# Patient Record
Sex: Female | Born: 1990
Health system: Southern US, Community
[De-identification: ages and names within clinical notes are randomized; demographics above are authoritative.]

## PROBLEM LIST (undated history)

## (undated) DIAGNOSIS — M419 Scoliosis, unspecified: Secondary | ICD-10-CM

## (undated) DIAGNOSIS — R079 Chest pain, unspecified: Secondary | ICD-10-CM

## (undated) DIAGNOSIS — F419 Anxiety disorder, unspecified: Secondary | ICD-10-CM

## (undated) DIAGNOSIS — F32A Depression, unspecified: Secondary | ICD-10-CM

## (undated) DIAGNOSIS — R109 Unspecified abdominal pain: Secondary | ICD-10-CM

## (undated) DIAGNOSIS — G709 Myoneural disorder, unspecified: Secondary | ICD-10-CM

## (undated) DIAGNOSIS — D519 Vitamin B12 deficiency anemia, unspecified: Secondary | ICD-10-CM

## (undated) DIAGNOSIS — M1990 Unspecified osteoarthritis, unspecified site: Secondary | ICD-10-CM

## (undated) DIAGNOSIS — T7840XA Allergy, unspecified, initial encounter: Secondary | ICD-10-CM

## (undated) DIAGNOSIS — J45909 Unspecified asthma, uncomplicated: Secondary | ICD-10-CM

## (undated) DIAGNOSIS — I10 Essential (primary) hypertension: Secondary | ICD-10-CM

## (undated) DIAGNOSIS — F319 Bipolar disorder, unspecified: Secondary | ICD-10-CM

## (undated) DIAGNOSIS — F4310 Post-traumatic stress disorder, unspecified: Secondary | ICD-10-CM

## (undated) DIAGNOSIS — F329 Major depressive disorder, single episode, unspecified: Secondary | ICD-10-CM

## (undated) DIAGNOSIS — F4000 Agoraphobia, unspecified: Secondary | ICD-10-CM

## (undated) DIAGNOSIS — I219 Acute myocardial infarction, unspecified: Secondary | ICD-10-CM

## (undated) DIAGNOSIS — G249 Dystonia, unspecified: Secondary | ICD-10-CM

## (undated) DIAGNOSIS — I639 Cerebral infarction, unspecified: Secondary | ICD-10-CM

## (undated) DIAGNOSIS — R0902 Hypoxemia: Secondary | ICD-10-CM

## (undated) DIAGNOSIS — G8929 Other chronic pain: Secondary | ICD-10-CM

## (undated) HISTORY — DX: Essential (primary) hypertension: I10

## (undated) HISTORY — DX: Hypoxemia: R09.02

## (undated) HISTORY — DX: Myoneural disorder, unspecified: G70.9

## (undated) HISTORY — DX: Unspecified asthma, uncomplicated: J45.909

## (undated) HISTORY — DX: Allergy, unspecified, initial encounter: T78.40XA

## (undated) HISTORY — DX: Cerebral infarction, unspecified: I63.9

## (undated) HISTORY — PX: MOUTH SURGERY: SHX715

## (undated) HISTORY — DX: Acute myocardial infarction, unspecified: I21.9

## (undated) HISTORY — DX: Unspecified osteoarthritis, unspecified site: M19.90

---

## 2012-07-08 ENCOUNTER — Emergency Department: Payer: Self-pay | Admitting: Emergency Medicine

## 2012-07-08 LAB — COMPREHENSIVE METABOLIC PANEL
Albumin: 4.7 g/dL (ref 3.4–5.0)
Alkaline Phosphatase: 68 U/L (ref 50–136)
Anion Gap: 11 (ref 7–16)
BUN: 11 mg/dL (ref 7–18)
Bilirubin,Total: 0.6 mg/dL (ref 0.2–1.0)
Calcium, Total: 9.2 mg/dL (ref 8.5–10.1)
Chloride: 106 mmol/L (ref 98–107)
Co2: 22 mmol/L (ref 21–32)
Creatinine: 0.55 mg/dL — ABNORMAL LOW (ref 0.60–1.30)
EGFR (African American): >60
EGFR (Non-African Amer.): >60
Glucose: 139 mg/dL — ABNORMAL HIGH (ref 65–99)
Osmolality: 279 (ref 275–301)
Potassium: 3.1 mmol/L — ABNORMAL LOW (ref 3.5–5.1)
SGOT(AST): 20 U/L (ref 15–37)
SGPT (ALT): 15 U/L (ref 12–78)
Sodium: 139 mmol/L (ref 136–145)
Total Protein: 8.3 g/dL — ABNORMAL HIGH (ref 6.4–8.2)

## 2012-07-08 LAB — CBC
HCT: 40.4 % (ref 35.0–47.0)
HGB: 13.7 g/dL (ref 12.0–16.0)
MCH: 29.6 pg (ref 26.0–34.0)
MCHC: 33.9 g/dL (ref 32.0–36.0)
MCV: 87 fL (ref 80–100)
Platelet: 339 10*3/uL (ref 150–440)
RBC: 4.63 10*6/uL (ref 3.80–5.20)
RDW: 12.3 % (ref 11.5–14.5)
WBC: 15.8 10*3/uL — ABNORMAL HIGH (ref 3.6–11.0)

## 2012-07-08 LAB — HCG, QUANTITATIVE, PREGNANCY: Beta Hcg, Quant.: <1 m[IU]/mL — ABNORMAL LOW

## 2012-07-08 LAB — MAGNESIUM: Magnesium: 1.4 mg/dL — ABNORMAL LOW

## 2012-07-20 ENCOUNTER — Emergency Department: Payer: Self-pay | Admitting: Emergency Medicine

## 2012-09-24 ENCOUNTER — Emergency Department: Payer: Self-pay | Admitting: Emergency Medicine

## 2012-09-30 ENCOUNTER — Emergency Department: Payer: Self-pay | Admitting: Emergency Medicine

## 2012-10-26 ENCOUNTER — Emergency Department: Payer: Self-pay | Admitting: Emergency Medicine

## 2012-10-26 LAB — URINALYSIS, COMPLETE
Bacteria: NONE SEEN
Bilirubin,UR: NEGATIVE
Blood: NEGATIVE
Glucose,UR: NEGATIVE mg/dL (ref 0–75)
Nitrite: NEGATIVE
Ph: 5 (ref 4.5–8.0)
Protein: NEGATIVE
RBC,UR: 2 /HPF (ref 0–5)
Specific Gravity: 1.02 (ref 1.003–1.030)
Squamous Epithelial: 1
WBC UR: 21 /HPF (ref 0–5)

## 2012-10-26 LAB — COMPREHENSIVE METABOLIC PANEL
Albumin: 4.5 g/dL (ref 3.4–5.0)
Alkaline Phosphatase: 70 U/L (ref 50–136)
Anion Gap: 8 (ref 7–16)
BUN: 12 mg/dL (ref 7–18)
Bilirubin,Total: 0.6 mg/dL (ref 0.2–1.0)
Calcium, Total: 9.4 mg/dL (ref 8.5–10.1)
Chloride: 106 mmol/L (ref 98–107)
Co2: 25 mmol/L (ref 21–32)
Creatinine: 0.74 mg/dL (ref 0.60–1.30)
EGFR (African American): >60
EGFR (Non-African Amer.): >60
Glucose: 102 mg/dL — ABNORMAL HIGH (ref 65–99)
Osmolality: 277 (ref 275–301)
Potassium: 3.8 mmol/L (ref 3.5–5.1)
SGOT(AST): 20 U/L (ref 15–37)
SGPT (ALT): 14 U/L (ref 12–78)
Sodium: 139 mmol/L (ref 136–145)
Total Protein: 8.2 g/dL (ref 6.4–8.2)

## 2012-10-26 LAB — CBC
HCT: 38.4 % (ref 35.0–47.0)
HGB: 13.4 g/dL (ref 12.0–16.0)
MCH: 30.2 pg (ref 26.0–34.0)
MCHC: 34.9 g/dL (ref 32.0–36.0)
MCV: 87 fL (ref 80–100)
Platelet: 288 10*3/uL (ref 150–440)
RBC: 4.44 10*6/uL (ref 3.80–5.20)
RDW: 12.5 % (ref 11.5–14.5)
WBC: 10.7 10*3/uL (ref 3.6–11.0)

## 2012-10-26 LAB — PREGNANCY, URINE: Pregnancy Test, Urine: NEGATIVE m[IU]/mL

## 2012-10-26 LAB — ETHANOL
Ethanol %: <0.003 % (ref 0.000–0.080)
Ethanol: <3 mg/dL

## 2012-10-26 LAB — TSH: Thyroid Stimulating Horm: 2.13 u[IU]/mL

## 2012-10-27 LAB — DRUG SCREEN, URINE

## 2012-10-27 LAB — URINE CULTURE

## 2012-12-10 ENCOUNTER — Emergency Department (HOSPITAL_COMMUNITY)
Admission: EM | Admit: 2012-12-10 | Discharge: 2012-12-10 | Disposition: A | Discharge status: Home / Self Care | Payer: Medicaid Other | Attending: Emergency Medicine | Admitting: Emergency Medicine

## 2012-12-10 DIAGNOSIS — K029 Dental caries, unspecified: Secondary | ICD-10-CM | POA: Insufficient documentation

## 2012-12-10 DIAGNOSIS — K011 Impacted teeth: Secondary | ICD-10-CM

## 2012-12-10 DIAGNOSIS — Z79899 Other long term (current) drug therapy: Secondary | ICD-10-CM | POA: Insufficient documentation

## 2012-12-10 DIAGNOSIS — Z792 Long term (current) use of antibiotics: Secondary | ICD-10-CM | POA: Insufficient documentation

## 2012-12-10 DIAGNOSIS — K006 Disturbances in tooth eruption: Secondary | ICD-10-CM | POA: Insufficient documentation

## 2012-12-10 MED ORDER — CLARITHROMYCIN 125 MG/5ML PO SUSR: 150.0000 mg | Freq: Four times a day (QID) | ORAL | Status: DC

## 2012-12-10 MED ORDER — HYDROCODONE-ACETAMINOPHEN 7.5-325 MG/15ML PO SOLN: 15.0000 mL | Freq: Four times a day (QID) | ORAL | Status: DC | PRN

## 2012-12-10 NOTE — ED Notes (Signed)
Pt co impacted wisdom teeth.  Has appt next week with oral surgeon.  Pt has severe PTSD and has cat in room that is her service animal.  Pt alert oriented X4.

## 2012-12-10 NOTE — ED Provider Notes (Signed)
CSN: [id number]     Arrival date & time 12/10/12  1352 History   First MD Initiated Contact with Patient 12/10/12 1412     Chief Complaint  Patient presents with  . Dental Pain   (Consider location/radiation/quality/duration/timing/severity/associated sxs/prior Treatment) HPI  Patient presents to the emergency department with a dental complaint. Symptoms began 1 week ago. They saw oral surgeon who has an appointment scheduled for this Wednesday. The patient has tried to alleviate pain with OTC pain medications.  Pain rated at a 10/10, characterized as throbbing in nature and located upper left molar. Patient denies fever, night sweats, chills, difficulty swallowing or opening mouth, SOB, nuchal rigidity or decreased ROM of neck.  Patient does not have a dentist and requests a resource guide at discharge.   No past medical history on file. No past surgical history on file. No family history on file. History  Substance Use Topics  . Smoking status: Not on file  . Smokeless tobacco: Not on file  . Alcohol Use: Not on file   OB History   No data available     Review of Systems  HENT: Positive for dental problem.   All other systems reviewed and are negative.    Allergies  Nickel and Other  Home Medications   Current Outpatient Rx  Name  Route  Sig  Dispense  Refill  . ARIPiprazole (ABILIFY) 2 MG tablet   Oral   Take 2 mg by mouth daily.         . IBUPROFEN CHILDRENS PO   Oral   Take 75 mLs by mouth daily as needed (pain, swelling).         . penicillin v potassium (VEETID) 500 MG tablet   Oral   Take 500 mg by mouth 4 (four) times daily.         . clarithromycin (BIAXIN) 125 MG/5ML suspension   Oral   Take 6 mLs (150 mg total) by mouth 4 (four) times daily.   200 mL   0   . HYDROcodone-acetaminophen (HYCET) 7.5-325 mg/15 ml solution   Oral   Take 15-30 mLs by mouth 4 (four) times daily as needed for pain.   120 mL   0    BP 128/78  Pulse 113  Resp  18  SpO2 100% Physical Exam  Nursing note and vitals reviewed. Constitutional: She appears well-developed and well-nourished. No distress.  HENT:  Head: Normocephalic and atraumatic.  Mouth/Throat: Uvula is midline, oropharynx is clear and moist and mucous membranes are normal. Normal dentition. Dental caries (Pts tooth shows no obvious abscess but moderate to severe tenderness to palpation of marked tooth) present. No edematous.    Eyes: Pupils are equal, round, and reactive to light.  Neck: Trachea normal, normal range of motion and full passive range of motion without pain. Neck supple.  Cardiovascular: Normal rate, regular rhythm, normal heart sounds and normal pulses.   Pulmonary/Chest: Effort normal and breath sounds normal. No respiratory distress. Chest wall is not dull to percussion. She exhibits no tenderness, no crepitus, no edema, no deformity and no retraction.  Abdominal: Soft. Normal appearance.  Musculoskeletal: Normal range of motion.  Neurological: She is alert. She has normal strength.  Skin: Skin is warm, dry and intact. She is not diaphoretic.  Psychiatric: She has a normal mood and affect. Her speech is normal. Cognition and memory are normal.    ED Course  Procedures (including critical care time) Labs Review Labs Reviewed - No data  to display Imaging Review No results found.  MDM   1. Tooth impaction    Patient has dental pain. No emergent s/sx's present. Patent airway. No trismus.  Will be given pain medication and antibiotics. I discussed the need to call dentist within 24/48 hours for follow-up. Dental referral given. Return to ED precautions given.  Pt voiced understanding and has agreed to follow-up.   22 y.o.Aldora Brandhorst's evaluation in the Emergency Department is complete. It has been determined that no acute conditions requiring further emergency intervention are present at this time. The patient/guardian have been advised of the diagnosis and  plan. We have discussed signs and symptoms that warrant return to the ED, such as changes or worsening in symptoms.  Vital signs are stable at discharge. Filed Vitals:   12/10/12 1402  BP: 128/78  Pulse: 113  Resp: 18    Patient/guardian has voiced understanding and agreed to follow-up with the PCP or specialist.       Dorthula Matas, PA-C 12/10/12 1510

## 2012-12-11 NOTE — ED Provider Notes (Signed)
Medical screening examination/treatment/procedure(s) were performed by non-physician practitioner and as supervising physician I was immediately available for consultation/collaboration.   Joya Gaskins, MD 12/11/12 1039

## 2013-02-22 ENCOUNTER — Encounter (HOSPITAL_COMMUNITY): Payer: Self-pay | Admitting: Emergency Medicine

## 2013-02-22 ENCOUNTER — Emergency Department (HOSPITAL_COMMUNITY)
Admission: EM | Admit: 2013-02-22 | Discharge: 2013-02-22 | Discharge status: Against Medical Advice | Payer: Medicaid Other | Attending: Emergency Medicine | Admitting: Emergency Medicine

## 2013-02-22 DIAGNOSIS — Z87891 Personal history of nicotine dependence: Secondary | ICD-10-CM | POA: Insufficient documentation

## 2013-02-22 DIAGNOSIS — R079 Chest pain, unspecified: Secondary | ICD-10-CM | POA: Insufficient documentation

## 2013-02-22 DIAGNOSIS — R5381 Other malaise: Secondary | ICD-10-CM | POA: Insufficient documentation

## 2013-02-22 HISTORY — DX: Post-traumatic stress disorder, unspecified: F43.10

## 2013-02-22 HISTORY — DX: Major depressive disorder, single episode, unspecified: F32.9

## 2013-02-22 HISTORY — DX: Bipolar disorder, unspecified: F31.9

## 2013-02-22 HISTORY — DX: Anxiety disorder, unspecified: F41.9

## 2013-02-22 HISTORY — DX: Depression, unspecified: F32.A

## 2013-02-22 NOTE — ED Notes (Signed)
No longer in waiting room

## 2013-02-22 NOTE — ED Notes (Signed)
Pt states generalized fatigue "forever" intermittently. First began at a younger age. Chest discomfort (to right chest) described as sharp and moving. States she has been told in the past that it was spasms but this time it is in her right chest and it has always been to her left chest. Pt is uncooperative and refusing oral temperature and became upset when asked to take her jacket off to obtain a blood pressure.

## 2013-04-07 ENCOUNTER — Encounter (HOSPITAL_COMMUNITY): Payer: Self-pay | Admitting: Emergency Medicine

## 2013-04-07 ENCOUNTER — Emergency Department (HOSPITAL_COMMUNITY)
Admission: EM | Admit: 2013-04-07 | Discharge: 2013-04-08 | Disposition: A | Discharge status: Home / Self Care | Payer: Medicaid Other | Attending: Emergency Medicine | Admitting: Emergency Medicine

## 2013-04-07 ENCOUNTER — Emergency Department (HOSPITAL_COMMUNITY)
Admission: EM | Admit: 2013-04-07 | Discharge: 2013-04-07 | Discharge status: Against Medical Advice | Payer: Medicaid Other | Attending: Emergency Medicine | Admitting: Emergency Medicine

## 2013-04-07 ENCOUNTER — Emergency Department (HOSPITAL_COMMUNITY): Payer: Medicaid Other

## 2013-04-07 DIAGNOSIS — Z3202 Encounter for pregnancy test, result negative: Secondary | ICD-10-CM | POA: Insufficient documentation

## 2013-04-07 DIAGNOSIS — R1030 Lower abdominal pain, unspecified: Secondary | ICD-10-CM

## 2013-04-07 DIAGNOSIS — R6883 Chills (without fever): Secondary | ICD-10-CM | POA: Insufficient documentation

## 2013-04-07 DIAGNOSIS — Z87891 Personal history of nicotine dependence: Secondary | ICD-10-CM | POA: Insufficient documentation

## 2013-04-07 DIAGNOSIS — R109 Unspecified abdominal pain: Secondary | ICD-10-CM

## 2013-04-07 DIAGNOSIS — F411 Generalized anxiety disorder: Secondary | ICD-10-CM | POA: Insufficient documentation

## 2013-04-07 DIAGNOSIS — R630 Anorexia: Secondary | ICD-10-CM | POA: Insufficient documentation

## 2013-04-07 DIAGNOSIS — R1031 Right lower quadrant pain: Secondary | ICD-10-CM | POA: Insufficient documentation

## 2013-04-07 DIAGNOSIS — R112 Nausea with vomiting, unspecified: Secondary | ICD-10-CM | POA: Insufficient documentation

## 2013-04-07 DIAGNOSIS — Z79899 Other long term (current) drug therapy: Secondary | ICD-10-CM | POA: Insufficient documentation

## 2013-04-07 DIAGNOSIS — Z8659 Personal history of other mental and behavioral disorders: Secondary | ICD-10-CM | POA: Insufficient documentation

## 2013-04-07 DIAGNOSIS — R002 Palpitations: Secondary | ICD-10-CM | POA: Insufficient documentation

## 2013-04-07 DIAGNOSIS — M545 Low back pain, unspecified: Secondary | ICD-10-CM | POA: Insufficient documentation

## 2013-04-07 DIAGNOSIS — N390 Urinary tract infection, site not specified: Secondary | ICD-10-CM | POA: Insufficient documentation

## 2013-04-07 DIAGNOSIS — G8929 Other chronic pain: Secondary | ICD-10-CM | POA: Insufficient documentation

## 2013-04-07 DIAGNOSIS — M549 Dorsalgia, unspecified: Secondary | ICD-10-CM | POA: Insufficient documentation

## 2013-04-07 DIAGNOSIS — R000 Tachycardia, unspecified: Secondary | ICD-10-CM | POA: Insufficient documentation

## 2013-04-07 DIAGNOSIS — R0602 Shortness of breath: Secondary | ICD-10-CM | POA: Insufficient documentation

## 2013-04-07 LAB — COMPREHENSIVE METABOLIC PANEL
ALT: 5 U/L (ref 0–35)
AST: 15 U/L (ref 0–37)
Albumin: 4.5 g/dL (ref 3.5–5.2)
Alkaline Phosphatase: 55 U/L (ref 39–117)
BUN: 12 mg/dL (ref 6–23)
CO2: 23 mEq/L (ref 19–32)
Calcium: 9.6 mg/dL (ref 8.4–10.5)
Chloride: 101 mEq/L (ref 96–112)
Creatinine, Ser: 0.58 mg/dL (ref 0.50–1.10)
GFR calc Af Amer: >90 mL/min (ref >90)
GFR calc non Af Amer: >90 mL/min (ref >90)
Glucose, Bld: 100 mg/dL — ABNORMAL HIGH (ref 70–99)
Potassium: 3.4 mEq/L — ABNORMAL LOW (ref 3.7–5.3)
Sodium: 139 mEq/L (ref 137–147)
Total Bilirubin: 0.4 mg/dL (ref 0.3–1.2)
Total Protein: 8.3 g/dL (ref 6.0–8.3)

## 2013-04-07 LAB — URINALYSIS, ROUTINE W REFLEX MICROSCOPIC
Bilirubin Urine: NEGATIVE
Glucose, UA: NEGATIVE mg/dL
Hgb urine dipstick: NEGATIVE
Ketones, ur: >80 mg/dL — AB
Nitrite: NEGATIVE
Protein, ur: NEGATIVE mg/dL
Specific Gravity, Urine: 1.027 (ref 1.005–1.030)
Urobilinogen, UA: 0.2 mg/dL (ref 0.0–1.0)
pH: 6 (ref 5.0–8.0)

## 2013-04-07 LAB — URINE MICROSCOPIC-ADD ON

## 2013-04-07 LAB — CBC WITH DIFFERENTIAL/PLATELET
Basophils Absolute: 0 10*3/uL (ref 0.0–0.1)
Basophils Relative: 0 % (ref 0–1)
Eosinophils Absolute: 0.1 10*3/uL (ref 0.0–0.7)
Eosinophils Relative: 1 % (ref 0–5)
HCT: 41 % (ref 36.0–46.0)
Hemoglobin: 14.6 g/dL (ref 12.0–15.0)
Lymphocytes Relative: 29 % (ref 12–46)
Lymphs Abs: 2.8 10*3/uL (ref 0.7–4.0)
MCH: 30.7 pg (ref 26.0–34.0)
MCHC: 35.6 g/dL (ref 30.0–36.0)
MCV: 86.1 fL (ref 78.0–100.0)
Monocytes Absolute: 1 10*3/uL (ref 0.1–1.0)
Monocytes Relative: 10 % (ref 3–12)
Neutro Abs: 5.8 10*3/uL (ref 1.7–7.7)
Neutrophils Relative %: 60 % (ref 43–77)
Platelets: 286 10*3/uL (ref 150–400)
RBC: 4.76 MIL/uL (ref 3.87–5.11)
RDW: 11.8 % (ref 11.5–15.5)
WBC: 9.6 10*3/uL (ref 4.0–10.5)

## 2013-04-07 LAB — PREGNANCY, URINE: Preg Test, Ur: NEGATIVE

## 2013-04-07 MED ORDER — DIPHENHYDRAMINE HCL 25 MG PO CAPS: 50.0000 mg | ORAL_CAPSULE | Freq: Once | ORAL | Status: DC

## 2013-04-07 MED ORDER — CEPHALEXIN 500 MG PO CAPS: 500.0000 mg | ORAL_CAPSULE | Freq: Four times a day (QID) | ORAL | Status: DC

## 2013-04-07 MED ORDER — IOHEXOL 300 MG/ML  SOLN
25.0000 mL | INTRAMUSCULAR | Status: DC
  Administered 2013-04-07: 25 mL via ORAL

## 2013-04-07 NOTE — ED Notes (Signed)
Pt refused gown. Mother sts the pt will leave AMA if pt has to be placed in gown

## 2013-04-07 NOTE — ED Notes (Signed)
This RN at bedside with Jarrett Soho PA to discuss plan of care with patient. Pt agreeable to Xrays and [place].

## 2013-04-07 NOTE — ED Notes (Signed)
Patient's caregiver out to nurse's station to state that patient cannot stay for 2 hours to drink contrast. Spoke with patient regarding issues, states the contrast makes her lightheaded and nauseous. Jarrett Soho PA made aware. Discussed possibility of taking Benadryl to help with skin allergen issues and anxiety, pt kept her face in her jacket entire time and refused to respond to this RN.

## 2013-04-07 NOTE — ED Notes (Signed)
Rt flank and abd pain for a month and lower back pain has anxiety also

## 2013-04-07 NOTE — ED Notes (Signed)
Transporter here to pick pt up for Xray. Pt refusing to be transported by stretcher or wheelchair and refusing to have any female staff care for the patient. Offered to wheel patient to Xray personally. Pt agreeable

## 2013-04-07 NOTE — Discharge Instructions (Signed)
1. Medications: keflex, usual home medications °2. Treatment: rest, drink plenty of fluids,  °3. Follow Up: Please followup with your primary doctor for discussion of your diagnoses and further evaluation after today's visit; if you do not have a primary care doctor use the resource guide provided to find one;  ° °Urinary Tract Infection °Urinary tract infections (UTIs) can develop anywhere along your urinary tract. Your urinary tract is your body's drainage system for removing wastes and extra water. Your urinary tract includes two kidneys, two ureters, a bladder, and a urethra. Your kidneys are a pair of bean-shaped organs. Each kidney is about the size of your fist. They are located below your ribs, one on each side of your spine. °CAUSES °Infections are caused by microbes, which are microscopic organisms, including fungi, viruses, and bacteria. These organisms are so small that they can only be seen through a microscope. Bacteria are the microbes that most commonly cause UTIs. °SYMPTOMS  °Symptoms of UTIs may vary by age and gender of the patient and by the location of the infection. Symptoms in young women typically include a frequent and intense urge to urinate and a painful, burning feeling in the bladder or urethra during urination. Older women and men are more likely to be tired, shaky, and weak and have muscle aches and abdominal pain. A fever may mean the infection is in your kidneys. Other symptoms of a kidney infection include pain in your back or sides below the ribs, nausea, and vomiting. °DIAGNOSIS °To diagnose a UTI, your caregiver will ask you about your symptoms. Your caregiver also will ask to provide a urine sample. The urine sample will be tested for bacteria and white blood cells. White blood cells are made by your body to help fight infection. °TREATMENT  °Typically, UTIs can be treated with medication. Because most UTIs are caused by a bacterial infection, they usually can be treated with  the use of antibiotics. The choice of antibiotic and length of treatment depend on your symptoms and the type of bacteria causing your infection. °HOME CARE INSTRUCTIONS °· If you were prescribed antibiotics, take them exactly as your caregiver instructs you. Finish the medication even if you feel better after you have only taken some of the medication. °· Drink enough water and fluids to keep your urine clear or pale yellow. °· Avoid caffeine, tea, and carbonated beverages. They tend to irritate your bladder. °· Empty your bladder often. Avoid holding urine for long periods of time. °· Empty your bladder before and after sexual intercourse. °· After a bowel movement, women should cleanse from front to back. Use each tissue only once. °SEEK MEDICAL CARE IF:  °· You have back pain. °· You develop a fever. °· Your symptoms do not begin to resolve within 3 days. °SEEK IMMEDIATE MEDICAL CARE IF:  °· You have severe back pain or lower abdominal pain. °· You develop chills. °· You have nausea or vomiting. °· You have continued burning or discomfort with urination. °MAKE SURE YOU:  °· Understand these instructions. °· Will watch your condition. °· Will get help right away if you are not doing well or get worse. °Document Released: 12/05/2004 Document Revised: 08/27/2011 Document Reviewed: 04/05/2011 °ExitCare® Patient Information ©2014 [employer]. ° ° °Emergency Department Resource Guide °1) Find a Doctor and Pay Out of Pocket °Although you won't have to find out who is covered by your insurance plan, it is a good idea to ask around and get recommendations. You will then   need to call the office and see if the doctor you have chosen will accept you as a new patient and what types of options they offer for patients who are self-pay. Some doctors offer discounts or will set up payment plans for their patients who do not have insurance, but you will need to ask so you aren't surprised when you get to your appointment. ° °2)  Contact Your Local Health Department °Not all health departments have doctors that can see patients for sick visits, but many do, so it is worth a call to see if yours does. If you don't know where your local health department is, you can check in your phone book. The CDC also has a tool to help you locate your state's health department, and many state websites also have listings of all of their local health departments. ° °3) Find a Walk-in Clinic °If your illness is not likely to be very severe or complicated, you may want to try a walk in clinic. These are popping up all over the country in pharmacies, drugstores, and shopping centers. They're usually staffed by nurse practitioners or physician assistants that have been trained to treat common illnesses and complaints. They're usually fairly quick and inexpensive. However, if you have serious medical issues or chronic medical problems, these are probably not your best option. ° °No Primary Care Doctor: °- Call Health Connect at  832-8000 - they can help you locate a primary care doctor that  accepts your insurance, provides certain services, etc. °- Physician Referral Service- 1-800-533-3463 ° °Chronic Pain Problems: °Organization         Address  Phone   Notes  °[facility]  ([phone] Patients need to be referred by their primary care doctor.  ° °Medication Assistance: °Organization         Address  Phone   Notes  °[place] [facility] [address] °[place], [place] [address] (336) 641-8030 --Must be a resident of [place] °-- Must have NO insurance coverage whatsoever (no Medicaid/ Medicare, etc.) °-- The pt. MUST have a primary care doctor that directs their care regularly and follows them in the community °  °[facility]  (866) 331-1348   °[employer]  (888) 892-1162   ° °Agencies that provide inexpensive medical care: °Organization         Address  Phone   Notes  °[facility]   ([phone]) [phone]   °[facility]    ([phone]) 832-7272   °[facility] [address] °[place], [place] [address] (336) [phone]   °[facility] [address], °[place] ([phone]) [phone]   °Planned Parenthood    (336) 373-0678   °[facility]    (336) 272-1050   °[facility] ° [address], [place] Phone:  (336) 832-4444, Fax:  ([phone]) [phone] Hours of Operation:  9 am - 6 pm, M-F.  Also accepts Medicaid/Medicare and self-pay.  °[facility] ° [address], [place] Phone: (336) [phone], Fax: ([phone]) [phone]. Hours of Operation:  8:30 am - 5:30 pm, M-F.  Also accepts Medicaid and self-pay.  °[facility] [place] [address], [place] Phone: (336) [phone]   °[facility] [address], [place], [place] (336)723-1848, Ext. 123 Mondays & Thursdays: 7-9 AM.  First 15 patients are seen on a first come, first serve basis. °  ° °  basis.    [place] Providers:  Organization         Address  Phone   Notes  [facility] [address], [place] [phone] Also accepts self-pay patients.  [facility] [address], [place]  [phone]   [place], [address], [place] [phone]   [facility] [address], [place] [phone]   Lucianne Lei [address], [place]   [phone] Only accepts [place] Access [place] patients after they have their name applied to their card.   Self-Pay (no insurance) in [facility]:  Organization         Address  Phone   Notes  Sickle Cell Patients, [facility] [place] [phone]   [facility] [place] [phone]   [facility]  [place]  [address], [place], [place] [phone]   [facility]/Dr. Osei-Bonsu  [address], [place] or [address], [place] [phone] Phone number for both [place] and [place] locations is the same.  [facility] [address] [place] [phone]   [facility] [address], [place] or [address] [phone]   [facility] [address], [place] [phone], phone; [phone], fax Sees patients 1st and 3rd Saturday of every month.  Must not qualify for public or private insurance (i.e. Medicaid, Medicare, [place] Health Choice, Veterans' Benefits)  Household income should be no more than 200% of the poverty level The clinic cannot treat you if you are pregnant or think you are pregnant  Sexually transmitted diseases are not treated at the clinic.    Dental Care: Organization         Address  Phone  Notes  [facility] Department of [facility] Sarah Ann [phone] Accepts children up to age 47 who are enrolled in [place] or [place]; pregnant women with a Medicaid card; and children who have applied for Medicaid or [place] Health Choice, but were declined, whose parents can pay a reduced fee at time of service.  [facility] Department of [facility]  [address], [place] [phone] Accepts children up to age 47 who are enrolled in [place] or [place]; pregnant women with a Medicaid card; and children who have applied for Medicaid or [place] Health Choice, but were declined, whose parents can pay a reduced fee at time of service.  [facility]  [place] [phone] Patients are seen by appointment only. Walk-ins are not accepted. [place] will see patients 65 years of age and  older. Monday - Tuesday (8am-5pm) Most Wednesdays (8:30-5pm) $30 per visit, cash only  [facility] Adult [facility] Access [facility]  [address], [facility] [phone] Patients are seen by appointment only. Walk-ins are not accepted. [place] will see patients 8 years of age and older. One Wednesday Evening (Monthly: Volunteer Based).  $30 per visit, cash only  [place]  [phone] for adults; Children under age 13, call Graduate Pediatric Dentistry at ([phone]. Children aged 63-14, please call [phone]  [phone] to request a pediatric application.  Dental services are provided in all areas of dental care including fillings, crowns and bridges, complete and partial dentures, implants, gum treatment, root canals, and extractions. Preventive care is also provided. Treatment is provided to both adults and children. Patients are selected via a lottery and there is often a waiting list.   [facility] [address] [place]  [phone] [email or web address]   [facility] [address] [place], [place] [phone], Ext. 123 Second and Fourth Thursday of each month, opens at 6:30 AM; Clinic ends at 9 AM.  Patients are seen on a first-come first-served basis, and a limited number are seen during each clinic.   [facility]  [address] Hillard Danker [place], [place] [phone]   Eligibility Requirements You must have lived in [place], [place], or [place] counties for at least the last three months.   You cannot be eligible for state or federal sponsored [employer], including [employer], [place], or [employer].   You generally cannot be eligible for healthcare insurance through your employer.    How to apply: Eligibility screenings are held every Tuesday and Wednesday afternoon from 1:00 pm until 4:00 pm. You do not need an appointment for the interview!  [facility] [address],  [place], [place]   [place]  [place] Department  [place]  [phone]    Behavioral Health Resources in the Community: Intensive Outpatient Programs Organization         Address  Phone  Notes  [facility]. [address], [place], [place] [phone]   [facility] [address] [place], [place]   [facility] [address], [place], [place]   [place] [address],  [place], [place] or [phone]   Substance Abuse Resources Organization         Address  Phone  Notes  [facility]  (224)155-5226   [place]  [phone]   [facility] [place]   Chinita Pester  4062795557   Residential & Outpatient Substance Abuse Program  [phone]   Psychological Services Organization         Address  Phone  Notes  [facility] [place]  [place]  [phone]   [place] [address], [place] or [phone]    Mobile Crisis Teams Organization         Address  Phone  Notes  [facility], Mobile Crisis Care Unit  [phone]   [facility]  [address] [place], [place]   [place] Levels [address] [place] [phone]    Self-Help/Support Groups Organization         Address  Phone             Notes  [place]. of [place] - variety of support groups  [place] Call for more information  Narcotics Anonymous (NA), [facility] [address], [employer]  2 meetings at this location   Special educational needs teacher         Address  Phone  Notes  [facility] [place],    [place]  [phone]   [facility]  [id number], [place], [place]   [address] [place], [place] [phone] Admissions: 8am-3pm M-F  Incentives Substance [place] [address],    [place], [place] [phone]   [facility] [address] [place], [place], [place]   [facility] [address]  [place], [place]   [facility] - Intensive Outpatient [address], [place], [place]   [facility] ([place].,  [place], [place] 1-(564)[phone] or [phone]   [facility] ([facility]) [address] [place], [place] Accepts Medicaid  [facility] [address]  [place] [phone])[phone] Substance Abuse/Addiction Treatment   [facility] Organization         Address  Phone  Notes  [facility]  (620)[phone]   Domenic Schwab, PhD [address] Arlis [place], [place]   (684)[phone] or [phone]   [place], [place] [phone]   [facility] [address], [place], [place] [phone] Insurance/Medicaid/sponsorship through [facility] and Families [address], [address] [place]                                    [place], [place] [phone] [place] [address], [place] [phone]    Dr. Adele Schilder  [phone]   [facility] [place] Dept. 1) [address], [place] 2) [facility] 3)  [address], [place] [phone]  [phone]   [place] [phone] or [phone] (After Hours)

## 2013-04-07 NOTE — ED Notes (Signed)
Patient states she doesn't want her temp taken at this time

## 2013-04-07 NOTE — ED Notes (Signed)
Pt reports RUQ pain x 3 days.  Was seen in ED earlier but left due to unable to drink PO contrast for CT (per caregiver).  Reports shaking all over, vomited blood x 1, and felt a pop to RUQ 45 min ago.  Jarrett Soho, [place] notified that pt has returned.  Pt placed in treatment room.

## 2013-04-07 NOTE — ED Notes (Signed)
Pt now agreeable to repeat vital signs. Bedside report given to [place] and Carmell [place] NT with patient and caregiver to provide input. Barbee Cough. PA made aware of updated vital signs. Pt calm, cooperative at this time, in  NAD.

## 2013-04-07 NOTE — ED Notes (Addendum)
Pt walked out of treatment room, slammed door, and yelled at nurse's station "It's too much for my body." Pt ambulatory from ED in NAD. Jarrett Soho PA aware

## 2013-04-07 NOTE — ED Notes (Signed)
Patient states is OK for this RN to leave bedside at this time. [place] in progress. Pt declining repeat vital signs at this time.
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
## 2013-04-07 NOTE — ED Notes (Signed)
Pt refusing to go to ultrasound r/t having to be in transported in stretcher for [place]. Able to contact Kyung Rudd from [place] to have portable US performed in patient's treatment room with her caregiver at bedside. Pt agreeable to this. This RN to sit in on [place] per patient request.

## 2013-04-07 NOTE — ED Provider Notes (Signed)
CSN: [id number]     Arrival date & time 04/07/13  1953 History   First MD Initiated Contact with Patient 04/07/13 2020     Chief Complaint  Patient presents with  . Abdominal Pain   (Consider location/radiation/quality/duration/timing/severity/associated sxs/prior Treatment) Patient is a 23 y.o. female presenting with abdominal pain. The history is provided by the patient, a caregiver and medical records. No language interpreter was used.  Abdominal Pain Associated symptoms: nausea and vomiting   Associated symptoms: no chest pain, no constipation, no cough, no diarrhea, no dysuria, no fatigue, no fever, no hematuria and no shortness of breath (denies SOB at this time)     Traci Hensley is a 23 y.o. female  with a hx of severe anxiety, PTSD, bipolar 1 disorder and chronic back pain presents to the Emergency Department 3 hours after leaving the [facility] emergency room complaining of continued right lower cartilage abdominal pain now with one episode of emesis.  Patient maintains at the right lower quadrant abdominal pain began 3 days ago when she had no emesis until she returned home this afternoon.  Patient's guardian reports she thinks that she had "an allergic reaction" to the dye which caused her to throw up.  Associated symptoms include chills without fever.   Nothing s not tried any treatments in the time between her departure and return to the emergency department and nothing seems to make her symptoms better or worse.    She again reports irregular menses, no vaginal discharge or vaginal bleeding. She also denies all sexual activity.    Past Medical History  Diagnosis Date  . PTSD (post-traumatic stress disorder)   . Bipolar 1 disorder   . Depression   . Anxiety   . Back pain    History reviewed. No pertinent past surgical history. No family history on file. History  Substance Use Topics  . Smoking status: Former Smoker    Types: Cigarettes  . Smokeless tobacco: Not on file   . Alcohol Use: No   OB History   Grav Para Term Preterm Abortions TAB SAB Ect Mult Living                 Review of Systems  Constitutional: Positive for appetite change. Negative for fever, diaphoresis, fatigue and unexpected weight change.  HENT: Negative for mouth sores and trouble swallowing.   Respiratory: Negative for cough, chest tightness, shortness of breath (denies SOB at this time), wheezing and stridor.   Cardiovascular: Positive for palpitations. Negative for chest pain.  Gastrointestinal: Positive for nausea, vomiting and abdominal pain. Negative for diarrhea, constipation, blood in stool, abdominal distention and rectal pain.  Genitourinary: Negative for dysuria, urgency, frequency, hematuria, flank pain and difficulty urinating.  Musculoskeletal: Positive for back pain (chronic). Negative for neck pain and neck stiffness.  Skin: Negative for rash.  Neurological: Negative for weakness.  Hematological: Negative for adenopathy.  Psychiatric/Behavioral: Negative for confusion.  All other systems reviewed and are negative.    Allergies  Adhesive; Blue dyes (parenteral); Contrast media; Corn-containing products; Green dyes; Nickel; and Other  Home Medications   Current Outpatient Rx  Name  Route  Sig  Dispense  Refill  . CRANBERRY PO   Oral   Take 1 tablet by mouth daily as needed (for urinary tract).          . cephALEXin (KEFLEX) 500 MG capsule   Oral   Take 1 capsule (500 mg total) by mouth 4 (four) times daily.   [place]  capsule   0    BP 110/71  Pulse 98  Temp(Src) 98.6 F (37 C) (Oral)  Resp 18  Ht 5\' 3"  (1.6 m)  Wt 105 lb (47.628 kg)  BMI 18.60 kg/m2  SpO2 100% Physical Exam  Nursing note and vitals reviewed. Constitutional: She appears well-developed and well-nourished.  HENT:  Head: Normocephalic and atraumatic.  Mouth/Throat: Oropharynx is clear and moist.  Eyes: Conjunctivae are normal. No scleral icterus.  Neck: Normal range of motion.   Cardiovascular: Regular rhythm, normal heart sounds and intact distal pulses.   No murmur heard. Tachycardia  Pulmonary/Chest: Effort normal and breath sounds normal. No respiratory distress. She has no wheezes.  Abdominal: Soft. Bowel sounds are normal. She exhibits no distension and no mass. There is generalized tenderness. There is no rebound, no guarding and no CVA tenderness.  Abd pain is more generalized than before but worse still slightly worse in the RLQ  Pt again refuses to remove her shirt or jacket and therefore her abd cannot be visualized  No CVA tenderness   Lymphadenopathy:    She has no cervical adenopathy.  Neurological: She is alert.  Skin: Skin is warm and dry.  Pt wearing long sleeves, long pants and socks and is unwilling to remove any of this for evaluation   Psychiatric: Her mood appears anxious. She is hyperactive. She is inattentive.    ED Course  Procedures (including critical care time) Labs Review Labs Reviewed  URINALYSIS, ROUTINE W REFLEX MICROSCOPIC - Abnormal; Notable for the following:    Ketones, ur >80 (*)    Leukocytes, UA TRACE (*)    All other components within normal limits  URINE MICROSCOPIC-ADD ON - Abnormal; Notable for the following:    Squamous Epithelial / LPF FEW (*)    Bacteria, UA MANY (*)    All other components within normal limits  URINE CULTURE  PREGNANCY, URINE   Imaging Review US Abdomen Complete  04/07/2013   CLINICAL DATA:  Right lower quadrant pain  EXAM: ULTRASOUND ABDOMEN COMPLETE  COMPARISON:  None.  FINDINGS: Gallbladder:  No gallstones or wall thickening visualized. No sonographic Murphy sign noted.  Common bile duct:  Diameter: 4 mm, within normal limits.  Liver:  No focal lesion identified. Within normal limits in parenchymal echogenicity.  IVC:  No abnormality visualized.  Pancreas:  Portions are poorly visualized.  Normal where seen.  Spleen:  Size and appearance within normal limits.  Right Kidney:  Length: 10  cm. Echogenicity within normal limits. No mass or hydronephrosis visualized.  Left Kidney:  Length: 11 cm. Echogenicity within normal limits. No mass or hydronephrosis visualized.  Abdominal aorta:  No aneurysm visualized.  Other findings:  None.  IMPRESSION: Suboptimal visualization of the pancreas. Otherwise, abdominal ultrasound within normal limits.   Electronically Signed   By: Carlos Levering M.D.   On: 04/07/2013 23:10   Dg Abd Acute W/chest  04/07/2013   CLINICAL DATA:  Right lower quadrant pain  EXAM: ACUTE ABDOMEN SERIES (ABDOMEN 2 VIEW & CHEST 1 VIEW)  COMPARISON:  None.  FINDINGS: Cardiomediastinal contours within normal range. Mild left lung base opacity.  No free intraperitoneal air. Nonobstructive bowel gas pattern. No acute osseous finding.  IMPRESSION: Nonobstructive bowel gas pattern.  Mild left lung base opacity, favor atelectasis.   Electronically Signed   By: Carlos Levering M.D.   On: 04/07/2013 21:45    EKG Interpretation   None       MDM   1. UTI (  lower urinary tract infection)   2. Abdominal pain, lower      Stephani Police this emergency department approximately 3 hours after initial evaluation for right lower quadrant pain and leaving AMA due to her anxiety.  At that time her labs were normal and she had no leukocytosis.  She was afebrile but tachycardic.  Discussed the situation at length with the patient and her caregiver. They're refusing a CT scan due to the dye and concern about allergies.   Will obtain ultrasound and acute abdominal series.  I discussed with them the low likelihood of appendicitis at this time but also my concerns that these 2 diagnostic tests are not perfect for appendicitis.  The patient adamantly refuses to take her clothes off. She refuses further vital signs. And she will not even consider a pelvic exam.    UA with trace leukocytes, 3-6 white blood cells and many bacteria; question possibility of UTI. Will treat as such. Urine culture  pending.  Abdominal ultrasound and acute abdominal series all unremarkable.  Pregnancy test negative.  Recommend close followup with primary care physician and assessment by cardiology for her episodes of tachycardia. Patient no longer tachycardic and ambulates without difficulty.  Patient is afebrile, non-tachycardiac, nontoxic and nonseptic appearing.  Low likelihood of appendicitis, but return precautions given.  Patient is nontoxic, nonseptic appearing, in no apparent distress.  Labs, imaging and vitals reviewed.  Patient does not meet the SIRS or Sepsis criteria.  On repeat exam patient does not have a surgical abdomin and there are nor peritoneal signs.  No indication of appendicitis, bowel obstruction, bowel perforation, cholecystitis, diverticulitis, PID or ectopic pregnancy.  Patient discharged home with symptomatic treatment and given strict instructions for follow-up with their primary care physician.  I have also discussed reasons to return immediately to the ER.  Patient expresses understanding and agrees with plan.   It has been determined that no acute conditions requiring further emergency intervention are present at this time. The patient/guardian have been advised of the diagnosis and plan. We have discussed signs and symptoms that warrant return to the ED, such as changes or worsening in symptoms.   Vital signs are stable at discharge.   BP 110/71  Pulse 98  Temp(Src) 98.6 F (37 C) (Oral)  Resp 18  Ht 5\' 3"  (1.6 m)  Wt 105 lb (47.628 kg)  BMI 18.60 kg/m2  SpO2 100%  Patient/guardian has voiced understanding and agreed to follow-up with the PCP or specialist.         Abigail Butts, PA-C 04/08/13 0125

## 2013-04-08 LAB — URINE CULTURE
Colony Count: NO GROWTH
Culture: NO GROWTH

## 2013-04-08 NOTE — ED Notes (Signed)
Pt refusing medicaiton for pain

## 2013-04-08 NOTE — ED Provider Notes (Signed)
Medical screening examination/treatment/procedure(s) were performed by non-physician practitioner and as supervising physician I was immediately available for consultation/collaboration.  Richarda Blade, MD 04/08/13 [phone]

## 2013-04-08 NOTE — ED Provider Notes (Signed)
Medical screening examination/treatment/procedure(s) were performed by non-physician practitioner and as supervising physician I was immediately available for consultation/collaboration.  Richarda Blade, MD 04/08/13 [phone]

## 2013-04-16 ENCOUNTER — Emergency Department (HOSPITAL_COMMUNITY): Payer: Medicaid Other

## 2013-04-16 ENCOUNTER — Emergency Department (HOSPITAL_COMMUNITY)
Admission: EM | Admit: 2013-04-16 | Discharge: 2013-04-16 | Disposition: A | Discharge status: Home / Self Care | Payer: Medicaid Other | Attending: Emergency Medicine | Admitting: Emergency Medicine

## 2013-04-16 ENCOUNTER — Encounter (HOSPITAL_COMMUNITY): Payer: Self-pay | Admitting: Emergency Medicine

## 2013-04-16 DIAGNOSIS — R0789 Other chest pain: Secondary | ICD-10-CM | POA: Insufficient documentation

## 2013-04-16 DIAGNOSIS — R079 Chest pain, unspecified: Secondary | ICD-10-CM

## 2013-04-16 DIAGNOSIS — Z87891 Personal history of nicotine dependence: Secondary | ICD-10-CM | POA: Insufficient documentation

## 2013-04-16 DIAGNOSIS — F411 Generalized anxiety disorder: Secondary | ICD-10-CM | POA: Insufficient documentation

## 2013-04-16 DIAGNOSIS — R0602 Shortness of breath: Secondary | ICD-10-CM | POA: Insufficient documentation

## 2013-04-16 LAB — CBC WITH DIFFERENTIAL/PLATELET
Basophils Absolute: 0 10*3/uL (ref 0.0–0.1)
Basophils Relative: 0 % (ref 0–1)
Eosinophils Absolute: 0.2 10*3/uL (ref 0.0–0.7)
Eosinophils Relative: 2 % (ref 0–5)
HCT: 41.6 % (ref 36.0–46.0)
Hemoglobin: 14.6 g/dL (ref 12.0–15.0)
Lymphocytes Relative: 25 % (ref 12–46)
Lymphs Abs: 2.5 10*3/uL (ref 0.7–4.0)
MCH: 30.5 pg (ref 26.0–34.0)
MCHC: 35.1 g/dL (ref 30.0–36.0)
MCV: 87 fL (ref 78.0–100.0)
Monocytes Absolute: 0.7 10*3/uL (ref 0.1–1.0)
Monocytes Relative: 7 % (ref 3–12)
Neutro Abs: 6.5 10*3/uL (ref 1.7–7.7)
Neutrophils Relative %: 66 % (ref 43–77)
Platelets: 312 10*3/uL (ref 150–400)
RBC: 4.78 MIL/uL (ref 3.87–5.11)
RDW: 11.9 % (ref 11.5–15.5)
WBC: 9.9 10*3/uL (ref 4.0–10.5)

## 2013-04-16 LAB — BASIC METABOLIC PANEL
BUN: 7 mg/dL (ref 6–23)
CO2: 22 mEq/L (ref 19–32)
Calcium: 9.4 mg/dL (ref 8.4–10.5)
Chloride: 104 mEq/L (ref 96–112)
Creatinine, Ser: 0.47 mg/dL — ABNORMAL LOW (ref 0.50–1.10)
GFR calc Af Amer: >90 mL/min (ref >90)
GFR calc non Af Amer: >90 mL/min (ref >90)
Glucose, Bld: 101 mg/dL — ABNORMAL HIGH (ref 70–99)
Potassium: 4.2 mEq/L (ref 3.7–5.3)
Sodium: 143 mEq/L (ref 137–147)

## 2013-04-16 MED ORDER — HYDROXYZINE HCL 10 MG/5ML PO SYRP: 10.0000 mg | ORAL_SOLUTION | Freq: Three times a day (TID) | ORAL | Status: DC | PRN

## 2013-04-16 MED ORDER — HYDROXYZINE HCL 10 MG/5ML PO SYRP
25.0000 mg | ORAL_SOLUTION | Freq: Once | ORAL | Status: AC
  Administered 2013-04-16: 25 mg via ORAL
  Filled 2013-04-16: qty 12.5

## 2013-04-16 NOTE — Discharge Instructions (Signed)
Follow up with a Primary Care doctor for further evaluation. Use the number provided on the resource guide to find a doctor. Refer to attached documents for more information. Take Atarax as needed for anxiety. Return to the ED with worsening or concerning symptoms. Take tylenol or ibuprofen as needed for pain.    Emergency Department Resource Guide 1) Find a Doctor and Pay Out of Pocket Although you won't have to find out who is covered by your insurance plan, it is a good idea to ask around and get recommendations. You will then need to call the office and see if the doctor you have chosen will accept you as a new patient and what types of options they offer for patients who are self-pay. Some doctors offer discounts or will set up payment plans for their patients who do not have insurance, but you will need to ask so you aren't surprised when you get to your appointment.  2) Contact Your Local Health Department Not all health departments have doctors that can see patients for sick visits, but many do, so it is worth a call to see if yours does. If you don't know where your local health department is, you can check in your phone book. The CDC also has a tool to help you locate your state's health department, and many state websites also have listings of all of their local health departments.  3) Find a [facility] If your illness is not likely to be very severe or complicated, you may want to try a walk in clinic. These are popping up all over the country in pharmacies, drugstores, and shopping centers. They're usually staffed by nurse practitioners or physician assistants that have been trained to treat common illnesses and complaints. They're usually fairly quick and inexpensive. However, if you have serious medical issues or chronic medical problems, these are probably not your best option.  No Primary Care Doctor: - Call Health Connect at  [phone] - they can help you locate a primary care  doctor that  accepts your insurance, provides certain services, etc. - Physician Referral Service- [phone]  Chronic Pain Problems: Organization         Address  Phone   Notes  [facility]  [phone] Patients need to be referred by their primary care doctor.   Medication Assistance: Organization         Address  Phone   Notes  [facility] Medication [facility] [address] [place], [place] [address] [phone] --Must be a resident of [facility] -- Must have NO insurance coverage whatsoever (no Medicaid/ Medicare, etc.) -- The pt. MUST have a primary care doctor that directs their care regularly and follows them in the community   Med[facility]ist  [phone]   [employer]  [phone]    Agencies that provide inexpensive medical care: Organization         Address  Phone   Notes  [place]  [phone]   [facility]    [phone]   [facility] [place], [place] [address] [phone]   [place] [address], [place] [phone]   Planned Parenthood    [phone]   [facility]    [phone]   [place] and [place] [address], [place] Phone:  [phone], Fax:  [phone] Hours of  Operation:  9 am - 6 pm, M-F.  Also accepts Medicaid/Medicare and self-pay.  [facility] for [place], [address], [place] Phone: [phone], Fax: [phone]. Hours of Operation:  8:30 am - 5:30 pm, M-F.  Also accepts Medicaid and self-pay.  [facility] [place] [address] [place] Phone: [phone]   [place], [place], [place] [phone], Ext. [phone] Mondays & Thursdays: 7-9 AM.  First 15 patients are seen on a first come, first serve basis.    [place]  Providers:  Organization         Address  Phone   Notes  [facility] [address], [place] [phone] Also accepts self-pay patients.  [facility] [address], [place]  [phone]   [place], [address], [place] [phone]   [facility] [address] [place] [phone]   Lucianne Lei [address], [place]   [phone] Only accepts [place] Access [place] patients after they have their name applied to their card.   Self-Pay (no insurance) in [facility]:  Organization         Address  Phone   Notes  Sickle Cell Patients, [facility] Internal Medicine [place] [phone]   [facility] [place] [phone]   [facility] [place]  [place], [place], [place] [phone]   [facility]/Dr. Osei-Bonsu  [address] [place] or [address], [place] [phone] Phone number for both [place] and [place] locations is the same.  Urgent Medical and [facility] [address] [place] [phone]   [facility] [address] [place] or [address] [phone]   [facility] [address] [place] [phone], phone; [phone], fax Sees patients 1st and 3rd Saturday of every month.  Must not qualify for public or private insurance (i.e. Medicaid, Medicare, [place] Health Choice, Veterans' Benefits)  Household income should be no more than 200% of the poverty level The clinic cannot treat you if you are pregnant or think you are pregnant  Sexually transmitted diseases are not treated at the clinic.    Dental Care: Organization         Address  Phone  Notes  [facility] Department of [facility] [place] [phone] Accepts children up to age 91 who are enrolled in [place] or [place]; pregnant women with a Medicaid card; and children who have applied for Medicaid or [place] Health Choice, but were declined, whose parents can pay a reduced fee at time of service.  [facility] Department of [facility]  [address], [place] [phone] Accepts children up to age 110 who are enrolled in [place] or [place]; pregnant women with a Medicaid card; and children who have applied for Medicaid or [place] Health Choice, but were declined, whose parents can pay a reduced fee at time of service.  [facility] Adult [facility] Access PROGRAM  [place] [phone] Patients are seen by appointment only. Walk-ins are not accepted. [place] will see patients  101 years of age and older. Monday - Tuesday (8am-5pm) Most Wednesdays (8:30-5pm) $30 per visit, cash only  [facility] Adult Dental Access PROGRAM  [address], [facility] [phone] Patients are seen by appointment only. Walk-ins are not accepted. [place] will see patients 9 years of age and older. One Wednesday Evening (Monthly: Volunteer Based).  $30 per visit, cash only  [place]  [phone] for adults; Children under age 42, call Graduate Pediatric Dentistry at [phone]. Children aged 42-14, please call [phone] to request a pediatric application.  Dental services are provided in all areas of dental care including fillings, crowns and bridges, complete and partial dentures, implants, gum treatment, root canals, and extractions. Preventive care is also provided. Treatment is provided to both adults and children. Patients are selected via a lottery and there is often a waiting list.   [facility] [address] [place]  [phone] [email or web address]   [facility]  [address] [place], [place] [phone], Ext. [phone] Second and Fourth Thursday of each month, opens at 6:30 AM; Clinic ends at 9 AM.  Patients are seen on a first-come first-served basis, and a limited number are seen during each clinic.   [facility]  [address] Hillard Danker [place], [place] [phone]   Eligibility Requirements You must have lived in [place], [place], or [place] counties for at least the last three months.   You cannot be eligible for state or federal sponsored [employer], including [employer], [place], or [employer].   You generally cannot be eligible for healthcare insurance through your employer.    How to apply: Eligibility screenings are held every Tuesday and Wednesday afternoon from 1:00 pm until 4:00 pm. You do not need an appointment for the interview!  [facility] [address] [place], [place]   [place]  [place] Department  [place]  [phone]    Behavioral Health Resources in the Community: Intensive Outpatient Programs Organization         Address  Phone  Notes  [place]. [address], [place], [place] [phone]   [facility] [address], [place], [place]   ADS: Alcohol & Drug Svcs [address], [place], [place]   [place] [address]  [place], [place] or [phone]   Substance Abuse Resources Organization         Address  Phone  Notes  Alcohol and Drug Services  [phone]   [place]  [phone]   [facility] [place]   Chinita Pester  [phone]   Residential & Outpatient Substance Abuse Program  [phone]   Psychological Services Organization         Address  Phone  Notes  [facility]  [place]  [phone]   [place] [address] [place] or [phone]    Mobile Crisis Teams Organization         Address  Phone  Notes  [facility], Mobile Crisis Care Unit  [phone]   [facility] Psychotherapeutic Services  [address] [place], [place]   [facility] [address] [place] [phone]    Self-Help/Support Groups Organization  Address  Phone             Notes  [place]. of [place] - variety of support groups  [place] Call for more information  Narcotics Anonymous (NA), [facility] [address], [employer] [place]  2 meetings at this location   Special educational needs teacher         Address  Phone  Notes  ASAP Residential Treatment [place],    [place]  [phone]   [facility]  [address], [place] [id number], [place], [place]   [place], [place] [phone] Admissions: 8am-3pm M-F  Incentives Substance [place] [address],    [place], [place] [phone]   [facility] [address] [place], [place], [place]   [facility] [address],  [place], [place]   [facility] Programs - Intensive Outpatient [address], [place], [place]   [facility] [place]  [place], [place] [phone] or [phone]   [facility]idential Treatment Services (RTS) [address] [place], [place] Accepts Medicaid  [facility] [place] [address]  [place] [phone] Substance Abuse/Addiction Treatment   [facility] Organization         Address  Phone  Notes  [facility]  [phone]   Domenic Schwab, PhD [address] Arlis Porta [place], [place]   [phone] or [phone]    [place], [place] [phone]   [facility] [address], [place], [place] [phone] Insurance/Medicaid/sponsorship through [facility] and Families [address], [address]                                    [place], [place] [phone] [place] [address], [place] [phone]    Dr. Adele Schilder  [phone]   [facility] Dept. 1) [address], [place] 2) [place] 3)  [place] [address], [place] [phone]  [phone]   [place] [phone] or [phone] (After Hours)

## 2013-04-16 NOTE — ED Notes (Signed)
Pt refused to urinate

## 2013-04-16 NOTE — ED Notes (Signed)
Pt in c/o chest tightness and shortness of breath that came on suddenly, history of same, history of anxiety, states this feels differently that her normal anxiety, no distress at this time
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
## 2013-04-17 ENCOUNTER — Emergency Department (HOSPITAL_COMMUNITY)
Admission: EM | Admit: 2013-04-17 | Discharge: 2013-04-18 | Disposition: A | Discharge status: Home / Self Care | Payer: Medicaid Other | Attending: Emergency Medicine | Admitting: Emergency Medicine

## 2013-04-17 ENCOUNTER — Encounter (HOSPITAL_COMMUNITY): Payer: Self-pay | Admitting: Emergency Medicine

## 2013-04-17 DIAGNOSIS — Z87891 Personal history of nicotine dependence: Secondary | ICD-10-CM | POA: Insufficient documentation

## 2013-04-17 DIAGNOSIS — IMO0002 Reserved for concepts with insufficient information to code with codable children: Secondary | ICD-10-CM | POA: Insufficient documentation

## 2013-04-17 DIAGNOSIS — I499 Cardiac arrhythmia, unspecified: Secondary | ICD-10-CM | POA: Insufficient documentation

## 2013-04-17 DIAGNOSIS — F4310 Post-traumatic stress disorder, unspecified: Secondary | ICD-10-CM | POA: Insufficient documentation

## 2013-04-17 DIAGNOSIS — R0789 Other chest pain: Secondary | ICD-10-CM | POA: Insufficient documentation

## 2013-04-17 DIAGNOSIS — F411 Generalized anxiety disorder: Secondary | ICD-10-CM | POA: Insufficient documentation

## 2013-04-17 DIAGNOSIS — G8929 Other chronic pain: Secondary | ICD-10-CM | POA: Insufficient documentation

## 2013-04-17 DIAGNOSIS — F911 Conduct disorder, childhood-onset type: Secondary | ICD-10-CM | POA: Insufficient documentation

## 2013-04-17 NOTE — ED Notes (Signed)
Pt POA states that pt mother states that she has hx of the same chest pain, and was dx with heart murmur and dysthymia.

## 2013-04-17 NOTE — ED Provider Notes (Signed)
Medical screening examination/treatment/procedure(s) were performed by non-physician practitioner and as supervising physician I was immediately available for consultation/collaboration.  EKG Interpretation    Date/Time:  Friday April 16 2013 14:49:25 EST Ventricular Rate:  94 PR Interval:  120 QRS Duration: 78 QT Interval:  342 QTC Calculation: 427 R Axis:   72 Text Interpretation:  Sinus rhythm with marked sinus arrhythmia Otherwise normal ECG No significant change since last tracing Confirmed by WARD  DO, KRISTEN [phone]) on 04/16/2013 2:56:43 PM              Elmer Sow, MD 04/17/13 1112

## 2013-04-17 NOTE — ED Notes (Signed)
The pt was seen here last pm for the same
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
## 2013-04-17 NOTE — ED Notes (Signed)
Hyperventilating at present

## 2013-04-17 NOTE — ED Notes (Signed)
The pt has had lt lower chest pain for 3-4 days with sob.  She was seen here last pm for the same.  lmp never??.  A feamle who reports that she is health care pow is answering all her questions for her

## 2013-04-18 MED ORDER — PANTOPRAZOLE SODIUM 40 MG PO TBEC: 40.0000 mg | DELAYED_RELEASE_TABLET | Freq: Every day | ORAL | Status: DC

## 2013-04-18 MED ORDER — FAMOTIDINE 40 MG/5ML PO SUSR: 40.0000 mg | Freq: Every day | ORAL | Status: DC

## 2013-04-18 MED ORDER — GI COCKTAIL ~~LOC~~
30.0000 mL | Freq: Once | ORAL | Status: AC
  Administered 2013-04-18: 30 mL via ORAL
  Filled 2013-04-18: qty 30
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
## 2013-04-18 NOTE — Discharge Instructions (Signed)
At this time it is unclear what is causing your symptoms.  It is important for you to keep your appointment with cardiology tomorrow.  Your symptoms maybe due to acid reflux irritating your esophagus.  Please take the medication prescribed to you.  Use the resources guides given to you on your prior visits to help establish local medical care.   Chest Pain (Nonspecific) It is often hard to give a specific diagnosis for the cause of chest pain. There is always a chance that your pain could be related to something serious, such as a heart attack or a blood clot in the lungs. You need to follow up with your caregiver for further evaluation. CAUSES   Heartburn.  Pneumonia or bronchitis.  Anxiety or stress.  Inflammation around your heart (pericarditis) or lung (pleuritis or pleurisy).  A blood clot in the lung.  A collapsed lung (pneumothorax). It can develop suddenly on its own (spontaneous pneumothorax) or from injury (trauma) to the chest.  Shingles infection (herpes zoster virus). The chest wall is composed of bones, muscles, and cartilage. Any of these can be the source of the pain.  The bones can be bruised by injury.  The muscles or cartilage can be strained by coughing or overwork.  The cartilage can be affected by inflammation and become sore (costochondritis). DIAGNOSIS  Lab tests or other studies, such as X-rays, electrocardiography, stress testing, or cardiac imaging, may be needed to find the cause of your pain.  TREATMENT   Treatment depends on what may be causing your chest pain. Treatment may include:  Acid blockers for heartburn.  Anti-inflammatory medicine.  Pain medicine for inflammatory conditions.  Antibiotics if an infection is present.  You may be advised to change lifestyle habits. This includes stopping smoking and avoiding alcohol, caffeine, and chocolate.  You may be advised to keep your head raised (elevated) when sleeping. This reduces the chance of  acid going backward from your stomach into your esophagus.  Most of the time, nonspecific chest pain will improve within 2 to 3 days with rest and mild pain medicine. HOME CARE INSTRUCTIONS   If antibiotics were prescribed, take your antibiotics as directed. Finish them even if you start to feel better.  For the next few days, avoid physical activities that bring on chest pain. Continue physical activities as directed.  Do not smoke.  Avoid drinking alcohol.  Only take over-the-counter or prescription medicine for pain, discomfort, or fever as directed by your caregiver.  Follow your caregiver's suggestions for further testing if your chest pain does not go away.  Keep any follow-up appointments you made. If you do not go to an appointment, you could develop lasting (chronic) problems with pain. If there is any problem keeping an appointment, you must call to reschedule. SEEK MEDICAL CARE IF:   You think you are having problems from the medicine you are taking. Read your medicine instructions carefully.  Your chest pain does not go away, even after treatment.  You develop a rash with blisters on your chest. SEEK IMMEDIATE MEDICAL CARE IF:   You have increased chest pain or pain that spreads to your arm, neck, jaw, back, or abdomen.  You develop shortness of breath, an increasing cough, or you are coughing up blood.  You have severe back or abdominal pain, feel nauseous, or vomit.  You develop severe weakness, fainting, or chills.  You have a fever. THIS IS AN EMERGENCY. Do not wait to see if the pain will go  away. Get medical help at once. Call your local emergency services (911 in [place] Do not drive yourself to the hospital. MAKE SURE YOU:   Understand these instructions.  Will watch your condition.  Will get help right away if you are not doing well or get worse. Document Released: 12/05/2004 Document Revised: 05/20/2011 Document Reviewed: 10/01/2007 [facility] Patient  Information 2014 [place].  Pain of Unknown Etiology (Pain Without a Known Cause) You have come to your caregiver because of pain. Pain can occur in any part of the body. Often there is not a definite cause. If your laboratory (blood or urine) work was normal and X-rays or other studies were normal, your caregiver may treat you without knowing the cause of the pain. An example of this is the headache. Most headaches are diagnosed by taking a history. This means your caregiver asks you questions about your headaches. Your caregiver determines a treatment based on your answers. Usually testing done for headaches is normal. Often testing is not done unless there is no response to medications. Regardless of where your pain is located today, you can be given medications to make you comfortable. If no physical cause of pain can be found, most cases of pain will gradually leave as suddenly as they came.  If you have a painful condition and no reason can be found for the pain, it is important that you follow up with your caregiver. If the pain becomes worse or does not go away, it may be necessary to repeat tests and look further for a possible cause.  Only take over-the-counter or prescription medicines for pain, discomfort, or fever as directed by your caregiver.  For the protection of your privacy, test results cannot be given over the phone. Make sure you receive the results of your test. Ask how these results are to be obtained if you have not been informed. It is your responsibility to obtain your test results.  You may continue all activities unless the activities cause more pain. When the pain lessens, it is important to gradually resume normal activities. Resume activities by beginning slowly and gradually increasing the intensity and duration of the activities or exercise. During periods of severe pain, bed rest may be helpful. Lie or sit in any position that is comfortable.  Ice used for acute  (sudden) conditions may be effective. Use a large plastic bag filled with ice and wrapped in a towel. This may provide pain relief.  See your caregiver for continued problems. Your caregiver can help or refer you for exercises or physical therapy if necessary. If you were given medications for your condition, do not drive, operate machinery or power tools, or sign legal documents for 24 hours. Do not drink alcohol, take sleeping pills, or take other medications that may interfere with treatment. See your caregiver immediately if you have pain that is becoming worse and not relieved by medications. Document Released: 11/20/2000 Document Revised: 12/16/2012 Document Reviewed: 02/25/2005 [facility] Patient Information 2014 [place].

## 2013-04-19 ENCOUNTER — Encounter: Payer: Self-pay | Admitting: Cardiology

## 2013-04-19 ENCOUNTER — Ambulatory Visit (INDEPENDENT_AMBULATORY_CARE_PROVIDER_SITE_OTHER): Payer: Medicaid Other | Admitting: Cardiology

## 2013-04-19 VITALS — BP 134/76 | HR 127 | Ht 61.0 in | Wt 107.0 lb

## 2013-04-19 DIAGNOSIS — I498 Other specified cardiac arrhythmias: Secondary | ICD-10-CM

## 2013-04-19 DIAGNOSIS — R000 Tachycardia, unspecified: Secondary | ICD-10-CM | POA: Insufficient documentation

## 2013-04-19 DIAGNOSIS — F419 Anxiety disorder, unspecified: Secondary | ICD-10-CM | POA: Insufficient documentation

## 2013-04-19 DIAGNOSIS — R002 Palpitations: Secondary | ICD-10-CM

## 2013-04-19 DIAGNOSIS — R079 Chest pain, unspecified: Secondary | ICD-10-CM

## 2013-04-19 DIAGNOSIS — F411 Generalized anxiety disorder: Secondary | ICD-10-CM

## 2013-04-19 MED ORDER — PROPRANOLOL HCL 20 MG/5ML PO SOLN: 20.0000 mg | Freq: Three times a day (TID) | ORAL | Status: DC

## 2013-04-19 NOTE — Patient Instructions (Addendum)
Your physician has recommended you make the following change in your medication:   1. Propranolol 20mg /41ml solution, take 5 mLs (20 mg total) by mouth 3 (three) times daily.  Your physician has recommended that you wear a 24 hour holter monitor. Holter monitors are medical devices that record the heart's electrical activity. Doctors most often use these monitors to diagnose arrhythmias. Arrhythmias are problems with the speed or rhythm of the heartbeat. The monitor is a small, portable device. You can wear one while you do your normal daily activities. This is usually used to diagnose what is causing palpitations/syncope (passing out).  Your physician has requested that you have an echocardiogram. Echocardiography is a painless test that uses sound waves to create images of your heart. It provides your doctor with information about the size and shape of your heart and how well your heart's chambers and valves are working. This procedure takes approximately one hour. There are no restrictions for this procedure.  Your physician recommends that you schedule a follow-up appointment in: 2 months with Dr. Marlou Porch
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
## 2013-04-20 ENCOUNTER — Telehealth: Payer: Self-pay | Admitting: Cardiology

## 2013-04-20 NOTE — Telephone Encounter (Signed)
New message     Medication that Dr Marlou Porch called in has no generic and the patient has no ins.  Is there another beta blocker that is a generic and is cheaper?

## 2013-04-21 ENCOUNTER — Telehealth: Payer: Self-pay | Admitting: *Deleted

## 2013-04-21 NOTE — Telephone Encounter (Signed)
Spoke with patient advised per Dr. Gillian Shields and Pharmacist that we can prescribe the pill form and it could be crushed and put in apple sauce. Patient advised that they read that the pill can not be altered. Will talk with Dr. Marlou Porch tomorrow and give the patient a call back.

## 2013-04-21 NOTE — Telephone Encounter (Signed)
Patients mother called stating that she needs a cheaper beta blocker as she has no insurance and cannot afford the propranolol that was sent in for her. Please advise. Thanks, MI

## 2013-04-21 NOTE — Telephone Encounter (Signed)
Instead of liquid form, let's use normal propranolol 20 mg by mouth crushed in applesauce 3 times a day.

## 2013-04-23 ENCOUNTER — Emergency Department (HOSPITAL_COMMUNITY)
Admission: EM | Admit: 2013-04-23 | Discharge: 2013-04-23 | Discharge status: Against Medical Advice | Payer: Medicaid Other | Attending: Emergency Medicine | Admitting: Emergency Medicine

## 2013-04-23 ENCOUNTER — Encounter (HOSPITAL_COMMUNITY): Payer: Self-pay | Admitting: Emergency Medicine

## 2013-04-23 ENCOUNTER — Ambulatory Visit (HOSPITAL_COMMUNITY): Payer: Medicaid Other

## 2013-04-23 DIAGNOSIS — R0609 Other forms of dyspnea: Secondary | ICD-10-CM | POA: Insufficient documentation

## 2013-04-23 DIAGNOSIS — F411 Generalized anxiety disorder: Secondary | ICD-10-CM | POA: Insufficient documentation

## 2013-04-23 DIAGNOSIS — Z79899 Other long term (current) drug therapy: Secondary | ICD-10-CM | POA: Insufficient documentation

## 2013-04-23 DIAGNOSIS — R0789 Other chest pain: Secondary | ICD-10-CM | POA: Insufficient documentation

## 2013-04-23 DIAGNOSIS — Z87891 Personal history of nicotine dependence: Secondary | ICD-10-CM | POA: Insufficient documentation

## 2013-04-23 DIAGNOSIS — R0600 Dyspnea, unspecified: Secondary | ICD-10-CM

## 2013-04-23 DIAGNOSIS — R0989 Other specified symptoms and signs involving the circulatory and respiratory systems: Secondary | ICD-10-CM | POA: Insufficient documentation

## 2013-04-23 MED ORDER — ALBUTEROL SULFATE (2.5 MG/3ML) 0.083% IN NEBU: 5.0000 mg | INHALATION_SOLUTION | Freq: Once | RESPIRATORY_TRACT | Status: DC

## 2013-04-23 NOTE — ED Notes (Signed)
Pt refuses to put on a gown as requested, refuses to ride in a wheelchair despite being "sob"

## 2013-04-23 NOTE — ED Notes (Signed)
Pt c/o sob and right side chest pain, states she feels like she cannot take a good breath.

## 2013-04-23 NOTE — ED Notes (Signed)
Pt seen leaving [facility] with caregiver/friend.

## 2013-04-23 NOTE — ED Notes (Signed)
Pt's friend to desk. Stated pt was getting antsy. Stated MD was just in room and was talking about changing beta blocker and ordered a neb treatment. Stated pt did not want neb treatment only beta blocker and if we didn't give it to her soon, pt was gonna leave. Explained that I was still waiting to hear about beta blocker and I hoped pt would not leave but is that's what she wanted to do, we couldn't stop her.
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
## 2013-04-24 ENCOUNTER — Other Ambulatory Visit: Payer: Self-pay | Admitting: Cardiology

## 2013-04-24 MED ORDER — PROPRANOLOL HCL 20 MG PO TABS: 20.0000 mg | ORAL_TABLET | Freq: Three times a day (TID) | ORAL | Status: DC

## 2013-04-26 ENCOUNTER — Telehealth: Payer: Self-pay | Admitting: Cardiology

## 2013-04-26 NOTE — Telephone Encounter (Signed)
Left message on patient  Voicemail advising to call the office to be put on the schedule to be seen, also instructed that if pain worsens to go to the ED. Will forward to Dr. Marlou Porch to make him aware.

## 2013-04-26 NOTE — Telephone Encounter (Signed)
New message     Pt c/o intense chest pain this am---bp is 110-80 something.  Pt was not able to have echo.

## 2013-04-28 NOTE — Telephone Encounter (Signed)
Thank you  for update

## 2013-04-29 ENCOUNTER — Encounter: Payer: Self-pay | Admitting: Cardiology

## 2013-04-29 ENCOUNTER — Other Ambulatory Visit (HOSPITAL_COMMUNITY): Payer: Self-pay | Admitting: Cardiology

## 2013-04-29 ENCOUNTER — Ambulatory Visit (HOSPITAL_COMMUNITY): Payer: Medicaid Other | Attending: Internal Medicine | Admitting: Cardiology

## 2013-04-29 ENCOUNTER — Ambulatory Visit (INDEPENDENT_AMBULATORY_CARE_PROVIDER_SITE_OTHER): Payer: Medicaid Other | Admitting: Cardiology

## 2013-04-29 VITALS — BP 98/70 | HR 67 | Ht 61.0 in | Wt 107.0 lb

## 2013-04-29 DIAGNOSIS — Z91199 Patient's noncompliance with other medical treatment and regimen due to unspecified reason: Secondary | ICD-10-CM | POA: Insufficient documentation

## 2013-04-29 DIAGNOSIS — R000 Tachycardia, unspecified: Secondary | ICD-10-CM

## 2013-04-29 DIAGNOSIS — R0989 Other specified symptoms and signs involving the circulatory and respiratory systems: Secondary | ICD-10-CM | POA: Insufficient documentation

## 2013-04-29 DIAGNOSIS — R079 Chest pain, unspecified: Secondary | ICD-10-CM

## 2013-04-29 DIAGNOSIS — Z9119 Patient's noncompliance with other medical treatment and regimen: Secondary | ICD-10-CM | POA: Insufficient documentation

## 2013-04-29 DIAGNOSIS — I498 Other specified cardiac arrhythmias: Secondary | ICD-10-CM

## 2013-04-29 DIAGNOSIS — R002 Palpitations: Secondary | ICD-10-CM

## 2013-04-29 DIAGNOSIS — R0609 Other forms of dyspnea: Secondary | ICD-10-CM | POA: Insufficient documentation

## 2013-04-29 DIAGNOSIS — F411 Generalized anxiety disorder: Secondary | ICD-10-CM

## 2013-04-29 DIAGNOSIS — F419 Anxiety disorder, unspecified: Secondary | ICD-10-CM

## 2013-04-29 MED ORDER — NITROGLYCERIN 0.4 MG SL SUBL: 0.4000 mg | SUBLINGUAL_TABLET | SUBLINGUAL | Status: DC | PRN

## 2013-04-29 NOTE — Patient Instructions (Addendum)
Your physician has recommended you make the following change in your medication:   START NITRO 0.4 MG AS NEEDED  Your physician recommends that you schedule a follow-up appointment in: [place], Marlou Porch

## 2013-04-29 NOTE — Telephone Encounter (Signed)
Spoke to POA echo appointment scheduled today at 3:30 pm.Spoke to Missy and she is aware of patient's situation.POA was told to have pt wear a plain bra and Missy will try a do echo with bra on.Appointment scheduled with Dr.Skains today at 2:15 pm and 04/30/13 appt cancelled.
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
## 2013-04-29 NOTE — Telephone Encounter (Signed)
New Prob   Has some questions regarding last ECHO and what occurred that day. Also, POA is concerned regarding CP pt has been experiencing since Monday. States pain is still constant, and described as "sharp". Please call.

## 2013-04-29 NOTE — Progress Notes (Signed)
Echo performed. 
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
## 2013-04-30 ENCOUNTER — Ambulatory Visit: Payer: Self-pay | Admitting: Cardiology

## 2013-04-30 ENCOUNTER — Telehealth: Payer: Self-pay | Admitting: Cardiology

## 2013-04-30 NOTE — Telephone Encounter (Signed)
New message     Have questions regarding echo report.  Which valve is leaking, etc

## 2013-05-02 ENCOUNTER — Encounter (HOSPITAL_COMMUNITY): Payer: Self-pay | Admitting: Emergency Medicine

## 2013-05-02 DIAGNOSIS — Z87891 Personal history of nicotine dependence: Secondary | ICD-10-CM | POA: Insufficient documentation

## 2013-05-02 DIAGNOSIS — F411 Generalized anxiety disorder: Secondary | ICD-10-CM | POA: Insufficient documentation

## 2013-05-02 DIAGNOSIS — R002 Palpitations: Secondary | ICD-10-CM | POA: Insufficient documentation

## 2013-05-02 DIAGNOSIS — Z79899 Other long term (current) drug therapy: Secondary | ICD-10-CM | POA: Insufficient documentation

## 2013-05-02 NOTE — ED Notes (Signed)
The pt has had chest pain lt upper since 2200 tonight with sob dizziness and nausea.  lmp irregular unknown

## 2013-05-02 NOTE — ED Notes (Signed)
ekg delayed unable to locate  Non allergic electrodes.  Pt reports  Severe reaction

## 2013-05-03 ENCOUNTER — Emergency Department (HOSPITAL_COMMUNITY)
Admission: EM | Admit: 2013-05-03 | Discharge: 2013-05-03 | Disposition: A | Discharge status: Home / Self Care | Payer: Medicaid Other | Attending: Emergency Medicine | Admitting: Emergency Medicine

## 2013-05-03 ENCOUNTER — Emergency Department (HOSPITAL_COMMUNITY): Payer: Medicaid Other

## 2013-05-03 DIAGNOSIS — R002 Palpitations: Secondary | ICD-10-CM

## 2013-05-03 NOTE — ED Notes (Signed)
Refused dc VS

## 2013-05-03 NOTE — ED Notes (Signed)
Pt presents with left sided chest pain onset tonight, worse with inspiration.  Pt reports taking 2 nitro at home without relief- pt states she is unable to give a pain score but "the pain is annoying".  Called tele floor to get leads that are for sensitive skin, pt placed on cardiac monitor, NSR at present.  Pt alert and oriented X 4, no acute distress noted at this time.

## 2013-05-03 NOTE — Telephone Encounter (Signed)
Follow up    Returned [facility] call

## 2013-05-03 NOTE — ED Notes (Signed)
Patient transported to X-ray 

## 2013-05-03 NOTE — ED Provider Notes (Signed)
CSN: [id number]     Arrival date & time 05/02/13  2330 History   First MD Initiated Contact with Patient 05/03/13 [phone]     Chief Complaint  Patient presents with  . Chest Pain     (Consider location/radiation/quality/duration/timing/severity/associated sxs/prior Treatment) HPI Comments: A history of chronic chest pain.  Was seen by Dr. Gypsy Balsam on femora 19 and started on propanolol 20 mg 3 times a day for presumed tachycardia.  She had an echo at that time, which showed that she had some slight thickening of the mitral valve, but the rest of her exam was noncontributory to to her inability to cooperate during the exam.  Do, to her PTSD.  She  Is scheduled to return in 3 weeks. She was given a prescription for nitroglycerin to use when she had discomfort.  The first time.  She used it.  She did get relief from her presumed vasospasm.  Tonight, she developed the same chest pain, only slightly higher.  She took nitroglycerin, with out any relief of her spasm.  She, states, that she has persistent pain, intermittently without rind or reason without precipitating factor.  Not related to movement, or respirations. Patient's mother does most of the talking for her and interrupts.  The patient.  Frequently  Patient is a 23 y.o. female presenting with chest pain. The history is provided by the patient and a parent.  Chest Pain Pain location:  Substernal area and L chest Pain radiates to:  Does not radiate Pain radiates to the back: no   Pain severity:  Moderate Onset quality:  Sudden Timing:  Intermittent Progression:  Worsening Chronicity:  Chronic Relieved by:  Nothing Ineffective treatments:  None tried Associated symptoms: palpitations   Associated symptoms: no cough, no dizziness, no fever, no nausea and no shortness of breath     Past Medical History  Diagnosis Date  . PTSD (post-traumatic stress disorder)   . Bipolar 1 disorder   . Depression   . Anxiety   . Back pain    History  reviewed. No pertinent past surgical history. Family History  Problem Relation Age of Onset  . Arrhythmia Mother    History  Substance Use Topics  . Smoking status: Former Smoker    Types: Cigarettes  . Smokeless tobacco: Not on file  . Alcohol Use: No   OB History   Grav Para Term Preterm Abortions TAB SAB Ect Mult Living                 Review of Systems  Constitutional: Negative for fever.  Respiratory: Negative for cough and shortness of breath.   Cardiovascular: Positive for chest pain and palpitations. Negative for leg swelling.  Gastrointestinal: Negative for nausea.  Musculoskeletal: Negative for myalgias.  Neurological: Negative for dizziness.  All other systems reviewed and are negative.      Allergies  Adhesive; Blue dyes (parenteral); Contrast media; Corn-containing products; Green dyes; Nickel; and Other  Home Medications   Current Outpatient Rx  Name  Route  Sig  Dispense  Refill  . hydrOXYzine (ATARAX) 10 MG/5ML syrup   Oral   Take 10 mg by mouth 3 (three) times daily as needed for itching, anxiety or vomiting.         . nitroGLYCERIN (NITROSTAT) 0.4 MG SL tablet   Sublingual   Place 1 tablet (0.4 mg total) under the tongue every 5 (five) minutes as needed for chest pain.   25 tablet   12   .  propranolol (INDERAL) 20 MG tablet   Oral   Take 1 tablet (20 mg total) by mouth 3 (three) times daily.   90 tablet   5    BP 116/73  Pulse 95  Temp(Src) 98.5 F (36.9 C) (Oral)  Resp 17  Wt 109 lb (49.442 kg)  SpO2 99% Physical Exam  Nursing note and vitals reviewed. Constitutional: She appears well-developed and well-nourished.  HENT:  Head: Normocephalic.  Eyes: Pupils are equal, round, and reactive to light.  Neck: Normal range of motion.  Cardiovascular: Normal rate, regular rhythm and normal heart sounds.   Pulmonary/Chest: Effort normal.  Neurological: She is alert.  Skin: Skin is warm. There is pallor.    ED Course  Procedures  (including critical care time) Labs Review Labs Reviewed  CBC WITH DIFFERENTIAL  BASIC METABOLIC PANEL  MAGNESIUM  D-DIMER, QUANTITATIVE  Randolm Idol, ED   Imaging Review Dg Chest 2 View  05/03/2013   CLINICAL DATA:  Chest pain.  EXAM: CHEST  2 VIEW  COMPARISON:  Chest radiograph from 04/16/2013  FINDINGS: The lungs are well-aerated and clear. There is no evidence of focal opacification, pleural effusion or pneumothorax.  The heart is normal in size; the mediastinal contour is within normal limits. No acute osseous abnormalities are seen.  IMPRESSION: No acute cardiopulmonary process seen.   Electronically Signed   By: Garald Balding M.D.   On: 05/03/2013 02:12    EKG Interpretation   None      I will obtain CBC BMET D- Dimer, Magnesium level, a continuous.  Rhythm strip to try to capture the patient's feeling of arrhythmia.  To be faxed to her cardiologist. She was to have a Holter monitor placed but do to her lack of insurance.  This has not been accomplished yet.  She is awaiting approval through an application Is refusing further testing.  She is refusing lab draws, stating, that she is not having the symptoms.  Now, we will find anything for levels are is normal Is provided with a copy of the rhythm strip and recommended.  Followup with Dr. Ramiro Harvest MDM   Final diagnoses:  Palpitations         Garald Balding, NP 05/03/13 0355  Garald Balding, NP 05/03/13 [phone]

## 2013-05-03 NOTE — Discharge Instructions (Signed)
Cardiac Arrhythmia  Your heart is a muscle that works to pump blood through your body by regular contractions. The beating of your heart is controlled by a system of special pacemaker cells. These cells control the electrical activity of the heart. When the system controlling this regular beating is disturbed, a heart rhythm abnormality (arrhythmia) results.  WHEN YOUR HEART SKIPS A BEAT  One of the most common and least serious heart arrhythmias is called an ectopic or premature atrial heartbeat (PAC). This may be noticed as a small change in your regular pulse. A PAC originates from the top part (atrium) of the heart. Within the right atrium, the SA node is the area that normally controls the regularity of the heart. PACs occur in heart tissue outside of the SA node region. You may feel this as a skipped beat or heart flutter, especially if several occur in succession or occur frequently.   Another arrhythmia is ventricular premature complex (VCP or PVC). These extra beats start out in the bottom, more muscular chambers of the heart. In most cases a PVC is harmless. If there are underlying causes that are making the heart irritable such as an overactive thyroid or a prior heart attack PVCs may be of more concern. In a few cases, medications to control the heart rhythm may be prescribed.  Things to try at home:   Cut down or avoid alcohol, tobacco and caffeine.   Get enough sleep.   Reduce stress.   Exercise more.  WHEN THE HEART BEATS TOO FAST  Atrial tachycardia is a fast heart rate, which starts out in the atrium. It may last from minutes to much longer. Your heart may beat 140 to 240 times per minute instead of the normal 60 to 100.   Symptoms include a worried feeling (anxiety) and a sense that your heart is beating fast and hard.   You may be able to stop the fast rate by holding your breath or bearing down as if you were going to have a bowel movement.   This type of fast rate is usually not  dangerous.  Atrial fibrillation and atrial flutter are other fast rhythms that start in the atria. Both conditions keep the atria from filling with enough blood so the heart does not work well.   Symptoms include feeling light-headed or faint.   These fast rates may be the result of heart damage or disease. Too much thyroid hormone may play a role.   There may be no clear cause or it may be from heart disease or damage.   Medication or a special electrical treatment (cardioversion) may be needed to get the heart beating normally.  Ventricular tachycardia is a fast heart rate that starts in the lower muscular chambers (ventricles) This is a serious disorder that requires treatment as soon as possible. You need someone else to get and use a small defibrillator.   Symptoms include collapse, chest pain, or being short of breath.   Treatment may include medication, procedures to improve blood flow to the heart, or an implantable cardiac defibrillator (ICD).  DIAGNOSIS    A cardiogram (EKG or ECG) will be done to see the arrhythmia, as well as lab tests to check the underlying cause.   If the extra beats or fast rate come and go, you may wear a Holter monitor that records your heart rate for a longer period of time.  SEEK MEDICAL CARE IF:   You have irregular or fast heartbeats (palpitations).     You experience skipped beats.   You develop lightheadedness.   You have chest discomfort.   You have shortness of breath.   You have more frequent episodes, if you are already being treated.  SEEK IMMEDIATE MEDICAL CARE IF:    You have severe chest pain, especially if the pain is crushing or pressure-like and spreads to the arms, back, neck, or jaw, or if you have sweating, feeling sick to your stomach (nausea), or shortness of breath. THIS IS AN EMERGENCY. Do not wait to see if the pain will go away. Get medical help at once. Call 911 or 0 (operator). DO NOT drive yourself to the hospital.   You feel dizzy or  faint.   You have episodes of previously documented atrial tachycardia that do not resolve with the techniques your caregiver has taught you.   Irregular or rapid heartbeats begin to occur more often than in the past, especially if they are associated with more pronounced symptoms or of longer duration.  Document Released: 02/25/2005 Document Revised: 05/20/2011 Document Reviewed: 10/14/2007  Exit[employer] Patient Information 2014 [employer].

## 2013-05-03 NOTE — ED Notes (Signed)
Patient refuses lab draws - Patient states: "they never find anything in my blood work.  I am not going to be stuck for them to tell me nothing is wrong with me."

## 2013-05-03 NOTE — ED Provider Notes (Signed)
Medical screening examination/treatment/procedure(s) were performed by non-physician practitioner and as supervising physician I was immediately available for consultation/collaboration.  EKG Interpretation   None         Elyn Peers, MD 05/03/13 [phone]

## 2013-05-03 NOTE — ED Notes (Signed)
Gail, NP at bedside. 

## 2013-05-03 NOTE — Telephone Encounter (Signed)
Left message on patient voicemail to call the office

## 2013-05-17 NOTE — Telephone Encounter (Signed)
Fraser Din eitn spoke with on call doctor and agreed to crush the tablet.

## 2013-05-17 NOTE — Telephone Encounter (Signed)
Patient spoke with doctor and decided to crush the pills.

## 2013-05-17 NOTE — Telephone Encounter (Signed)
Issue resolved see prior note.Marland KitchenMarland KitchenMarland Kitchen

## 2013-05-18 ENCOUNTER — Ambulatory Visit: Payer: Self-pay | Admitting: Cardiology

## 2013-07-01 ENCOUNTER — Encounter (HOSPITAL_COMMUNITY): Payer: Self-pay | Admitting: Emergency Medicine

## 2013-07-01 ENCOUNTER — Emergency Department (HOSPITAL_COMMUNITY)
Admission: EM | Admit: 2013-07-01 | Discharge: 2013-07-02 | Disposition: A | Discharge status: Home / Self Care | Payer: Medicaid Other | Attending: Emergency Medicine | Admitting: Emergency Medicine

## 2013-07-01 DIAGNOSIS — F319 Bipolar disorder, unspecified: Secondary | ICD-10-CM | POA: Insufficient documentation

## 2013-07-01 DIAGNOSIS — F411 Generalized anxiety disorder: Secondary | ICD-10-CM | POA: Insufficient documentation

## 2013-07-01 DIAGNOSIS — H9319 Tinnitus, unspecified ear: Secondary | ICD-10-CM | POA: Insufficient documentation

## 2013-07-01 DIAGNOSIS — H6690 Otitis media, unspecified, unspecified ear: Secondary | ICD-10-CM | POA: Insufficient documentation

## 2013-07-01 DIAGNOSIS — Z79899 Other long term (current) drug therapy: Secondary | ICD-10-CM | POA: Insufficient documentation

## 2013-07-01 DIAGNOSIS — Z87891 Personal history of nicotine dependence: Secondary | ICD-10-CM | POA: Insufficient documentation

## 2013-07-01 MED ORDER — PREDNISOLONE 15 MG/5ML PO SYRP: 30.0000 mg | ORAL_SOLUTION | Freq: Every day | ORAL | Status: AC

## 2013-07-01 MED ORDER — PREDNISONE 20 MG PO TABS
40.0000 mg | ORAL_TABLET | Freq: Once | ORAL | Status: AC
  Administered 2013-07-01: 40 mg via ORAL
  Filled 2013-07-01: qty 2

## 2013-07-01 NOTE — ED Notes (Addendum)
Pt reports increased tinnitus to bilateral ears x 4 days. Pt seen at [facility] today, rx with neomcyin/polymyxinB sulfates/hydrocorisone for left ear. Pt reports using drops to left ear and now "It sounds like there is wind in there." Pt in NAD. Pt has hx of ear problems, hx of chronic tinnitus to ears. States "the exam today was so painful, and it hurts so bad now." Pt appears anxious, upset.

## 2013-07-01 NOTE — ED Provider Notes (Signed)
CSN: [id number]     Arrival date & time 07/01/13  2303 History  This chart was scribed for non-physician practitioner, Junius Creamer, [place] working with Kalman Drape, MD by Frederich Balding, ED scribe. This patient was seen in room TR07C/TR07C and the patient's care was started at 11:24 PM.   Chief Complaint  Patient presents with  . Tinnitus    The history is provided by the patient. No language interpreter was used.   HPI Comments: Traci Hensley is a 23 y.o. female with history of chronic tinnitus who presents to the Emergency Department complaining of bilateral tinnitus and right ear pain that started 4 days ago. Pt was seen at urgent care today and discharged home with Augmentin and ear drops. She was using the drops in her left ear and states it sounds like there is wind in there. Pt is also having hearing loss after using the drops. Pt is unsure of LMP because she does not have them regularly.   Past Medical History  Diagnosis Date  . PTSD (post-traumatic stress disorder)   . Bipolar 1 disorder   . Depression   . Anxiety   . Back pain    History reviewed. No pertinent past surgical history. Family History  Problem Relation Age of Onset  . Arrhythmia Mother    History  Substance Use Topics  . Smoking status: Former Smoker    Types: Cigarettes  . Smokeless tobacco: Not on file  . Alcohol Use: No   OB History   Grav Para Term Preterm Abortions TAB SAB Ect Mult Living                 Review of Systems  HENT: Positive for ear pain, hearing loss and tinnitus.   All other systems reviewed and are negative.  Allergies  Adhesive; Blue dyes (parenteral); Contrast media; Corn-containing products; Green dyes; Nickel; and Other  Home Medications   Prior to Admission medications   Medication Sig Start Date End Date Taking? Authorizing Provider  hydrOXYzine (ATARAX) 10 MG/5ML syrup Take 10 mg by mouth 3 (three) times daily as needed for itching, anxiety or vomiting.    Historical  Provider, MD  nitroGLYCERIN (NITROSTAT) 0.4 MG SL tablet Place 1 tablet (0.4 mg total) under the tongue every 5 (five) minutes as needed for chest pain. 04/29/13   Candee Furbish, MD  propranolol (INDERAL) 20 MG tablet Take 1 tablet (20 mg total) by mouth 3 (three) times daily. 04/24/13   Brittainy Simmons, PA-C   BP 143/81  Pulse 82  Temp(Src) 98.2 F (36.8 C) (Oral)  Resp 18  SpO2 100%  Physical Exam  Nursing note and vitals reviewed. Constitutional: She is oriented to person, place, and time. She appears well-developed and well-nourished. No distress.  HENT:  Head: Normocephalic and atraumatic.  Right Ear: Ear canal normal. Tympanic membrane is bulging.  Left Ear: Tympanic membrane and ear canal normal.  Bulging, fluctuant right TM.   Eyes: EOM are normal.  Neck: Neck supple. No tracheal deviation present.  Cardiovascular: Normal rate.   Pulmonary/Chest: Effort normal. No respiratory distress.  Musculoskeletal: Normal range of motion.  Neurological: She is alert and oriented to person, place, and time.  Skin: Skin is warm and dry.  Psychiatric: She has a normal mood and affect. Her behavior is normal.    ED Course  Procedures (including critical care time)  DIAGNOSTIC STUDIES: Oxygen Saturation is 100% on RA, normal by my interpretation.    COORDINATION OF CARE: 11:27  PM-Discussed treatment plan which includes continuing Augmentin and starting a low dose steroid with pt at bedside and pt agreed to plan. Will give pt ENT referral and advised her to follow up.  Labs Review Labs Reviewed - No data to display  Imaging Review No results found.   EKG Interpretation None      MDM  I started the patient on a steroid taper for her tinnitus and hearing loss.  She is to continue her, Augmentin.  She's also been referred to Dr. Constance Holster, ENT specialty for further evaluation of her chronic tinnis Final diagnoses:  Otitis media  Ringing in the ears      I personally performed  the services described in this documentation, which was scribed in my presence. The recorded information has been reviewed and is accurate.  Garald Balding, NP 07/02/13 0003

## 2013-07-02 MED ORDER — HYDROXYZINE HCL 10 MG/5ML PO SYRP: 25.0000 mg | ORAL_SOLUTION | Freq: Three times a day (TID) | ORAL | Status: DC | PRN

## 2013-07-02 NOTE — Discharge Instructions (Signed)
Please call Dr. Janeice Robinson  office to set an appointment for further evaluation of your tinnitis

## 2013-07-02 NOTE — ED Provider Notes (Signed)
Medical screening examination/treatment/procedure(s) were performed by non-physician practitioner and as supervising physician I was immediately available for consultation/collaboration.   EKG Interpretation None       Kalman Drape, MD 07/02/13 [phone]

## 2013-07-06 ENCOUNTER — Telehealth: Payer: Self-pay | Admitting: Cardiology

## 2013-07-16 ENCOUNTER — Ambulatory Visit: Payer: Medicaid Other | Admitting: Cardiology

## 2013-07-16 ENCOUNTER — Ambulatory Visit (INDEPENDENT_AMBULATORY_CARE_PROVIDER_SITE_OTHER): Payer: Medicaid Other | Admitting: Cardiology

## 2013-07-16 ENCOUNTER — Encounter: Payer: Self-pay | Admitting: Cardiology

## 2013-07-16 VITALS — BP 102/67 | HR 68 | Ht 61.0 in | Wt 117.0 lb

## 2013-07-16 DIAGNOSIS — R000 Tachycardia, unspecified: Secondary | ICD-10-CM

## 2013-07-16 DIAGNOSIS — R002 Palpitations: Secondary | ICD-10-CM

## 2013-07-16 DIAGNOSIS — R079 Chest pain, unspecified: Secondary | ICD-10-CM

## 2013-07-16 DIAGNOSIS — I498 Other specified cardiac arrhythmias: Secondary | ICD-10-CM

## 2013-07-16 MED ORDER — ISOSORBIDE MONONITRATE ER 30 MG PO TB24: 30.0000 mg | ORAL_TABLET | Freq: Every day | ORAL | Status: DC

## 2013-07-16 NOTE — Patient Instructions (Signed)
Your physician has recommended you make the following change in your medication:   1. Start Imdur 30mg  once daily  Your physician recommends that you schedule a follow-up appointment in:  3 months with Dr. Marlou Porch
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
## 2013-09-08 ENCOUNTER — Telehealth: Payer: Self-pay | Admitting: Cardiology

## 2013-09-08 NOTE — Telephone Encounter (Signed)
Spoke with pt's medical power of attorney  She is with the patient who is reporting sharp shooting chest pains through her ribs (location varying) and into her neck.  She reports having this type of pain in the past but SL ntg typically resolves it.  The pains started last night.  She took the last SL Ntg about 11:45 am.  This is helping but for only about 30 minutes.  She has not taken her Propranolol today (last dose last night).  Her most recent BP was 107/71 HR 125.  She does not want to take the Propranolol because her BP is "so low."   She denies this being a panic attack or anxiety and does not want to go to the ED because they "never do anything but made her sit there for hours, tell her she isn't having a heart attack and send her home".   Advised with her HR being 125 bpm she should take her propranolol to slow it down which could possibly resolve the chest pains she is having.  She does not want to do this.  She would like to know what Dr Marlou Porch wants her to do.  They are aware he is not in the office today however I will document this information and send it to him for review and any orders.  Aware I will call back once I have heard from him.

## 2013-09-08 NOTE — Telephone Encounter (Signed)
Spoke with POA - pt is napping at this time.  POA is aware of instructions from Dr Marlou Porch.  She will inform pt and they will call back if further issues.

## 2013-09-08 NOTE — Telephone Encounter (Signed)
New problem ° ° °Pt having chest pain. °

## 2013-09-08 NOTE — Telephone Encounter (Signed)
OK to take beta blocker. Next would take an extra Imdur 30mg  if pain does not seem to get better.  Traci Furbish, MD

## 2013-09-10 ENCOUNTER — Telehealth: Payer: Self-pay | Admitting: Physician Assistant
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
## 2013-09-10 NOTE — Telephone Encounter (Signed)
Received page to call [facility] back at [phone] regarding patient. I tried multiple times over the span of 15 minutes but kept getting voicemail after multiple rings. I left message on machine to please call back if they require our assistance.   PA-C

## 2013-09-13 ENCOUNTER — Telehealth: Payer: Self-pay | Admitting: Cardiology

## 2013-09-13 NOTE — Telephone Encounter (Signed)
Left message of orders and to call back with name of pharmacy she needs RX to be sent to.

## 2013-09-13 NOTE — Telephone Encounter (Signed)
Spoke with POA - pt continues to have the same type of discomfort as last week.  Feels like a bubble may burst in her chest.  Reports some SOB like she can't get enough air.  She feels like someone is standing on her chest.  The chest pain still feels like sharp shooting pains thru her ribs.  She has take the extra Isosorbide nearly everyday as well as SL Ntg without any relief.  She does not want to get to the hospital and only wants Dr Marlou Porch to give her instructions on what she should do.  POA aware MD is not in the office today.  I will forward this information to him for review and call back with further orders and recommendations.

## 2013-09-13 NOTE — Telephone Encounter (Signed)
Let's try Imdur 60mg  QD.  Candee Furbish, MD

## 2013-09-13 NOTE — Telephone Encounter (Signed)
New problem:     POA  Called to report pt still having the same problem now for 1 wk, left Lower Chest and Chest pressure.  Please give her a call.

## 2013-09-14 MED ORDER — ISOSORBIDE MONONITRATE ER 60 MG PO TB24: 60.0000 mg | ORAL_TABLET | Freq: Every day | ORAL | Status: DC

## 2013-09-14 NOTE — Telephone Encounter (Signed)
Follow up     For [place]

## 2013-09-14 NOTE — Telephone Encounter (Signed)
RX sent into pharmacy as requested.   

## 2013-10-15 ENCOUNTER — Ambulatory Visit: Payer: Medicaid Other | Admitting: Cardiology

## 2013-11-03 ENCOUNTER — Ambulatory Visit: Payer: Medicaid Other | Admitting: Cardiology

## 2013-12-08 ENCOUNTER — Telehealth: Payer: Self-pay | Admitting: Cardiology

## 2013-12-08 NOTE — Telephone Encounter (Signed)
Agree, No echo for now. Last one was reassuring and very recently done. Will see her in clinic.

## 2013-12-08 NOTE — Telephone Encounter (Signed)
New message    Marjio calling patient imdur was increase   Last night describe as bubble trying to pop . When she breathe bubble rise to top.     Now she feeling the pain on  Every heartbeat since last night. No emergency room visit .

## 2013-12-08 NOTE — Telephone Encounter (Signed)
Left message for pt to call back to discuss.  Reviewed with Dr Marlou Porch - no new orders given at this time.  Chest pain sounds unchanged for some time.

## 2013-12-08 NOTE — Telephone Encounter (Signed)
Spoke with POA who states understanding

## 2013-12-08 NOTE — Telephone Encounter (Signed)
Spoke with [place] - reports pt has been having complaints with her normal chest pain with the feeling that a bubble was going to pop in her chest - last night she reports feeling that the bubble DID pop.  She feels as though her heart is pounding and beating very strong and this feeling is almost constant.  She has been using SL Ntg (2) atleast daily.  HR is in the 70's and BP has been running 120/80 to 130/90.  She has been taking medications as listed except for propranolol 20 mg as she is adjusting based on her BP.  Health Care POA states she is still taking Atarax but this is not her anxiety and they know the difference.  Pt is questioning whether she should have another Echo to further evaluate d/t increased palps and feeling like heart is pounding.  Attempted reassurance, instructed I will ask about another echo however the most recent one was 04/2013 demonstrating only trivial MVR.  Pt is scheduled to be seen in clinic on Monday Oct 5,2015 at 1:45 but they would still like me to ask about the echo despite me explaining Dr Marlou Porch more than likely will want to examine her before ordering any further testing and that at this short notice I doubt we would be able to schedule prior to her appt with him.

## 2013-12-13 ENCOUNTER — Ambulatory Visit (INDEPENDENT_AMBULATORY_CARE_PROVIDER_SITE_OTHER): Payer: Medicaid Other | Admitting: Cardiology

## 2013-12-13 ENCOUNTER — Encounter: Payer: Self-pay | Admitting: Cardiology

## 2013-12-13 VITALS — BP 110/78 | HR 100 | Ht 61.0 in

## 2013-12-13 DIAGNOSIS — R002 Palpitations: Secondary | ICD-10-CM

## 2013-12-13 DIAGNOSIS — F419 Anxiety disorder, unspecified: Secondary | ICD-10-CM

## 2013-12-13 DIAGNOSIS — R0789 Other chest pain: Secondary | ICD-10-CM

## 2013-12-13 MED ORDER — CARVEDILOL 3.125 MG PO TABS: 3.1250 mg | ORAL_TABLET | Freq: Two times a day (BID) | ORAL | Status: DC
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
## 2013-12-13 NOTE — Patient Instructions (Signed)
Please start Carvedilol 3.125 mg one twice a day. You may take the Propranolol on an as needed basis. Continue all other medications as listed.  Your physician has recommended that you wear a holter monitor for 24 hr. Holter monitors are medical devices that record the heart's electrical activity. Doctors most often use these monitors to diagnose arrhythmias. Arrhythmias are problems with the speed or rhythm of the heartbeat. The monitor is a small, portable device. You can wear one while you do your normal daily activities. This is usually used to diagnose what is causing palpitations/syncope (passing out).  Follow up in 1 month with Dr Marlou Porch.

## 2013-12-16 ENCOUNTER — Ambulatory Visit: Payer: Medicaid Other | Admitting: Cardiology

## 2013-12-21 NOTE — Telephone Encounter (Signed)
error 

## 2013-12-27 ENCOUNTER — Telehealth: Payer: Self-pay | Admitting: *Deleted

## 2013-12-27 NOTE — Telephone Encounter (Signed)
Traci Hensley was asking about possible iv valium in office instead of lidocaine , explained to her that we do not do that in our office and do not know of any office that does. She stated that up in [place] they do that in office

## 2013-12-27 NOTE — Telephone Encounter (Signed)
Kyra Manges called on behalf of sareena, stating that the patient has a complete fear of doctors offices and people , wants to know if she would be able to have anything done tomorrow. She has a allergy to nickel and has become tolerable to lidocaine. She is wanting to know if she can give her a valium with the ok of the doctor. i explained to her that i would ask but with it not being her prescription it was not advised and the doctor could not give that ok. Will call her back once i speak with dr Jacqualyn Posey

## 2013-12-28 ENCOUNTER — Encounter: Payer: Self-pay | Admitting: Podiatry

## 2013-12-28 ENCOUNTER — Ambulatory Visit: Payer: Medicaid Other | Admitting: Podiatry

## 2013-12-28 VITALS — BP 115/68 | HR 92 | Resp 16 | Ht 61.0 in | Wt 117.0 lb

## 2013-12-28 DIAGNOSIS — M79609 Pain in unspecified limb: Secondary | ICD-10-CM

## 2013-12-28 DIAGNOSIS — M79676 Pain in unspecified toe(s): Secondary | ICD-10-CM

## 2013-12-28 DIAGNOSIS — L600 Ingrowing nail: Secondary | ICD-10-CM

## 2013-12-28 MED ORDER — AMOXICILLIN-POT CLAVULANATE 250-62.5 MG/5ML PO SUSR: 500.0000 mg | Freq: Three times a day (TID) | ORAL | Status: DC

## 2013-12-28 MED ORDER — MUPIROCIN CALCIUM 2 % EX CREA: 1.0000 "application " | TOPICAL_CREAM | Freq: Two times a day (BID) | CUTANEOUS | Status: DC

## 2013-12-28 NOTE — Patient Instructions (Addendum)

## 2013-12-28 NOTE — Progress Notes (Signed)
Subjective:    Patient ID: Traci Hensley, female    DOB: 1990/03/28, 23 y.o.   MRN: [id number]  HPI Comments: Traci Hensley, 23 year old female, presents the office today with chronic reoccurring painful bilateral big toe nails. She presents the office today with her power of attorney. She states that she has been to the emergency room for time since July 2015 do to the nails. They have attempted on several occasions remove the nails without any resolution. Patient states that she has difficulty with local anesthetics for which she states they do not work for her. She denies any systemic complaints such as fevers, chills, nausea, vomiting. She has been soaking her feet in Epson salts and warm water and she has been on a variety of antibiotics previously for this. At this time the patient and her power of attorney are requesting bilateral hallux nail avulsions. No other complaints at this time.     Review of Systems  Constitutional: Positive for chills, activity change, appetite change and fatigue.       Sweating   HENT: Positive for trouble swallowing.        Ringing in ears  Respiratory:       Difficulty breathing   Cardiovascular: Positive for palpitations.  Endocrine: Positive for cold intolerance and heat intolerance.  Musculoskeletal: Positive for back pain.       Joint pain Difficulty walking Muscle pain   Skin:       Change in nails Thick scars  Allergic/Immunologic: Positive for environmental allergies and food allergies.  Neurological: Positive for tremors, weakness, light-headedness, numbness and headaches.  Psychiatric/Behavioral: The patient is nervous/anxious.   All other systems reviewed and are negative.      Objective:   Physical Exam AAO x3, NAD DP/PT pulses palpable 2/4 bilaterally, CRT less than 3 seconds Protective sensation intact with Simms Weinstein monofilament, Achilles tendon reflex intact Subjectively patient states that she has numbness to both  feet. Bilateral hallux nails with significant pain directly over the hallux nails bilaterally. There is incurvation of both the medial and lateral borders bilaterally to There is no surrounding erythema or any ascending cellulitis. No drainage or purulence noted. MMT 5/5, ROM WNL No open lesions or pre-ulcerative lesions.  No calf pain, swelling, warmth     Assessment & Plan:  23 year old female with bilateral hallux nail pain, ingrown toenails. - Surgical versus conservative treatment discussed including all alternatives, risks, complications discussed the patient and her power of attorney in detail.  -At this time they both elects to proceed with total nail avulsions to bilateral hallux with chemical matrixectomy. Risks and complications including but not limited to infection, chronic wound, loss of toe discussed for which they both understood. They elect to proceed with the procedure. Under standard conditions a total of 2.5 cc of Carbocaine was infiltrated to bilateral hallux. An additional 1 cc was infiltrated on the right side area. Properly anesthetize bilateral hallux were prepped in sterile fashion. Tourniquets were then applied. Next bilateral hallux nails were excised making sure to remove all nail borders. Once they were completely removed phenol was then applied under standard conditions. That area was then copiously irrigated. Silvadene was applied followed by a nonadherent dressing and a dry sterile dressing. Patient tolerated the procedure well without any complications. -Post procedure instructions discussed with the patient and her POA for which he verbally understood. They are requesting Bactroban cream due to the allergy in the ointment and cannot use antibiotic ointment. Also requesting to  have Augmentin liquid for antibiotic. Both of these were prescribed to her. -Monitor for any clinical signs or symptoms of infection and directed to call the office immediately if any are to occur  or go directly to the emergency room. -Followup in 1 week or sooner if any problems are to arise or any change in symptoms. In the meantime call the office with any questions, concerns. Followup with her PCP for other issues mentioned and review of systems as is her chronic in no acute changes.

## 2013-12-31 ENCOUNTER — Encounter: Payer: Self-pay | Admitting: *Deleted

## 2013-12-31 NOTE — Progress Notes (Signed)
Patient ID: Traci Hensley, female   DOB: 12-28-1990, 23 y.o.   MRN: [id number] Patients POA, Loyal Jacobson, will come in Wednesday, 01/05/2014, at 5:00 PM.  She will pick up a 24 hour holter monitor for [place] that can be put on in the home.

## 2014-01-04 ENCOUNTER — Ambulatory Visit: Payer: Medicaid Other | Admitting: Podiatry

## 2014-01-04 ENCOUNTER — Ambulatory Visit (INDEPENDENT_AMBULATORY_CARE_PROVIDER_SITE_OTHER): Payer: Medicaid Other | Admitting: Podiatry

## 2014-01-04 VITALS — BP 115/65 | HR 65 | Resp 16

## 2014-01-04 DIAGNOSIS — Z9889 Other specified postprocedural states: Secondary | ICD-10-CM

## 2014-01-04 MED ORDER — SILVER SULFADIAZINE 1 % EX CREA: 1.0000 "application " | TOPICAL_CREAM | Freq: Every day | CUTANEOUS | Status: DC

## 2014-01-04 NOTE — Progress Notes (Signed)
Patient ID: Traci Hensley, female   DOB: [birth year], 23 y.o.   MRN: [id number]  Subjective: Patient returns the office today for follow-up evaluation status post bilateral hallux nail avulsions with chemical matrixectomy due to chronically painful nails. She states that she has been continuing to soak the feet in Epsom salts twice a day. They were unable to get the Bactroban cream as it was on recall and insurance would not pay for the ointment. They have been applying Neosporin to the area although her and her POA know that she has an allergy to it. They've been applying a dressing over the area as well. They're asking if they can get a prescription for the Silvadene cream to apply over the area. She has been continuing with her antibiotic. She denies any systemic complaints of fevers, chills, nausea, vomiting. She doesn't she has mild discomfort over the procedure sites. Denies any purulence. No redness. No other complaints at this time.  Objective: AAO x3, NAD DP/PT pulses palpable bilaterally, CRT less than 3 seconds Protective sensation intact with Simms Weinstein monofilament, vibratory sensation intact, Achilles tendon reflex intact Bilateral hallux nail avulsions. Wound base is granular with a small amount of serous drainage. Mild tenderness directly over the surgical sites. No purulence identified. No surrounding erythema or ascending cellulitis. No fluctuance or crepitus or malodor. No calf pain, swelling, warmth, erythema.  Assessment: 23 year old female status post bilateral hallux nail avulsion secondary to chronically painful nails.  Plan: -Various treatment options discussed including alternatives, risks, complications. -Continue soaking feet twice a day and not insult soaks. Prescribed Silvadene cream to apply to the area followed by a dressing. Continue to monitor for any clinical signs or symptoms of infection and directed to call the office immediately if any are to occur ago  directly to the emergency room. -Finish course of antibiotics. -Follow-up in 2 weeks or sooner if any problems are to arise or any change in symptoms. In the meantime call the office in the questions, concerns, change in symptoms.

## 2014-01-04 NOTE — Patient Instructions (Signed)
Continue to soak in epsom salts twice a day followed by silvadene and a dressing.  Monitor for any signs/symptoms of infection. Call the office immediately if any occur or go directly to the emergency room. Call with any questions/concerns.

## 2014-01-05 ENCOUNTER — Encounter (INDEPENDENT_AMBULATORY_CARE_PROVIDER_SITE_OTHER): Payer: Medicaid Other

## 2014-01-05 ENCOUNTER — Encounter: Payer: Self-pay | Admitting: *Deleted

## 2014-01-05 DIAGNOSIS — R002 Palpitations: Secondary | ICD-10-CM

## 2014-01-05 NOTE — Progress Notes (Signed)
Patient ID: Traci Hensley, female   DOB: 1990-10-13, 23 y.o.   MRN: [id number] EVO holter monitor given to POA, Traci Hensley along with instructions on how to apply and start monitor. Electrodes for sensitive skin, adhesive sensitivity supplied.

## 2014-01-06 ENCOUNTER — Telehealth: Payer: Self-pay | Admitting: Cardiology

## 2014-01-06 ENCOUNTER — Telehealth: Payer: Self-pay | Admitting: *Deleted

## 2014-01-06 ENCOUNTER — Encounter: Payer: Self-pay | Admitting: Podiatry

## 2014-01-06 NOTE — Telephone Encounter (Signed)
Traci Hensley was only able to wear monitor approximately 3 hours.  Her POA Traci Hensley stated, she woke up screaming about 10 PM last night.  She had a severe allergic reaction to electrodes possibly due to adhesive or nickel.  She did say they were able to record appox. 3 hours of data with some minor symptoms during that time.  Traci Hensley will bring monitor back Friday or Monday.  She also mentioned finding something on the internet regarding monitors using suction cups instead of electrodes.

## 2014-01-06 NOTE — Telephone Encounter (Signed)
New message     Pt has a 24hr monitor.  She is allergic to the electrodes.  She will bring the monitor back today.  How do you turn off the monitor?

## 2014-01-18 ENCOUNTER — Ambulatory Visit: Payer: Medicaid Other | Admitting: Cardiology

## 2014-01-20 ENCOUNTER — Ambulatory Visit (INDEPENDENT_AMBULATORY_CARE_PROVIDER_SITE_OTHER): Payer: Medicaid Other | Admitting: Podiatry

## 2014-01-20 VITALS — BP 112/71 | HR 78 | Resp 16

## 2014-01-20 DIAGNOSIS — Z9889 Other specified postprocedural states: Secondary | ICD-10-CM

## 2014-01-20 DIAGNOSIS — M7662 Achilles tendinitis, left leg: Secondary | ICD-10-CM

## 2014-01-20 DIAGNOSIS — M7661 Achilles tendinitis, right leg: Secondary | ICD-10-CM

## 2014-01-20 MED ORDER — AMOXICILLIN-POT CLAVULANATE 250-62.5 MG/5ML PO SUSR
500.0000 mg | Freq: Three times a day (TID) | ORAL | Status: DC
Start: 2014-01-20 — End: 2014-06-17

## 2014-01-20 NOTE — Patient Instructions (Signed)
Continue soaking in epsom salt soaks twice a day followed by silvadene and dressing. Can leave uncovered at night. Restart antibiotics. Monitor for any signs/symptoms of infection. Call the office immediately if any occur or go directly to the emergency room. Call with any questions/concerns.

## 2014-01-23 DIAGNOSIS — M7661 Achilles tendinitis, right leg: Secondary | ICD-10-CM | POA: Insufficient documentation

## 2014-01-23 DIAGNOSIS — M7662 Achilles tendinitis, left leg: Secondary | ICD-10-CM

## 2014-01-23 NOTE — Progress Notes (Signed)
Patient ID: Traci Hensley, female   DOB: [birth year], 24 y.o.   MRN: [id number]  Subjective: 23 year old female returns the office they with her power of attorney for follow-up evaluation of bilateral hallux nail removal. She states that she continues with Epson salt soaks twice a day followed by Silvadene and a dressing. She states of the areas to continue to hurt however she does have less pain compared to before the procedure. She states that since she has less pain now she is walking more normal and she has some pain along the Achilles tendon, which she has had before. She denies any recent injury or trauma to the area.The patient states that she is concerned about it is slow to heal. She denies any systemic complaints of fevers, chills, nausea, vomiting. No acute changes since last appointment. No other complaints at this time.  Objective: AAO x3, NAD DP/PT pulses palpable bilaterally, CRT less than 3 seconds Protective sensation intact with Simms Weinstein monofilament, vibratory sensation intact, Achilles tendon reflex intact Bilateral hallux nail status post total nail removal. In the central aspect of the nail bed there is an area of granulation tissue however it is improved compared to last appointment. There is mild serous drainage identified, however no purulence. There is mild surrounding erythema around the procedure sites on the hallux. There is no evidence of ascending cellulitis or increase in warmth to the toes. There is mild tenderness on the distal aspect of the Achilles tendon at the insertion of the calcaneus. There is no overlying edema, erythema, increase in warmth. There is no defect or hypertrophy the tendon identified. The Achilles tendon is intact. MMT 5/5, ROM WNL No other open lesions identified. No calf pain, swelling, warmth, erythema.  Assessment: 23 year old female status post bilateral hallux nail removal with chemical matrixectomy secondary to significant  pain/incurvation of the nail borders.   Plan: -Treatment options discussed including alternatives, risks, complications. -At this time the site does appear to be healing. However the patient is concerned that is taking long to heal and she states that she typically takes longer to heal. For concern of this we'll proceed with noninvasive vascular testing, including ABIs. She will be contacted for the appointment. -Continue with soaking in Epson salts twice a day followed by Silvadene dressings. She can leave uncovered at night. -Due to the mild erythema will renew Augmentin. -Monitoring clinical signs or symptoms of infection and directed to call the office immediately if any are to occur or go to the ER. -For the Achilles tendinitis discussed stretching exercises I also dispensed heel lifts to wear in her shoes. -Follow-up in 2 weeks or sooner if any problems are to arise. In the meantime, call the office with any questions, concerns, change in symptoms.

## 2014-01-27 ENCOUNTER — Encounter: Payer: Self-pay | Admitting: Cardiology

## 2014-02-11 ENCOUNTER — Telehealth: Payer: Self-pay | Admitting: *Deleted

## 2014-02-11 NOTE — Telephone Encounter (Signed)
Called [facility] spoke with Traci Hensley. Stated pt an appt sch three times and cancelled all three times.

## 2014-02-23 ENCOUNTER — Emergency Department (HOSPITAL_COMMUNITY): Payer: Medicaid Other

## 2014-02-23 ENCOUNTER — Emergency Department (HOSPITAL_COMMUNITY)
Admission: EM | Admit: 2014-02-23 | Discharge: 2014-02-23 | Discharge status: Against Medical Advice | Payer: Medicaid Other | Attending: Emergency Medicine | Admitting: Emergency Medicine

## 2014-02-23 ENCOUNTER — Encounter (HOSPITAL_COMMUNITY): Payer: Self-pay | Admitting: Emergency Medicine

## 2014-02-23 DIAGNOSIS — R231 Pallor: Secondary | ICD-10-CM | POA: Diagnosis not present

## 2014-02-23 DIAGNOSIS — Z79899 Other long term (current) drug therapy: Secondary | ICD-10-CM | POA: Insufficient documentation

## 2014-02-23 DIAGNOSIS — M546 Pain in thoracic spine: Secondary | ICD-10-CM | POA: Diagnosis present

## 2014-02-23 DIAGNOSIS — R079 Chest pain, unspecified: Secondary | ICD-10-CM | POA: Insufficient documentation

## 2014-02-23 DIAGNOSIS — Z8659 Personal history of other mental and behavioral disorders: Secondary | ICD-10-CM | POA: Diagnosis not present

## 2014-02-23 DIAGNOSIS — Z720 Tobacco use: Secondary | ICD-10-CM | POA: Diagnosis not present

## 2014-02-23 DIAGNOSIS — Z792 Long term (current) use of antibiotics: Secondary | ICD-10-CM | POA: Insufficient documentation

## 2014-02-23 HISTORY — DX: Agoraphobia, unspecified: F40.00

## 2014-02-23 NOTE — ED Notes (Signed)
Patient c/o severe back pain since yesterday; states it hurts to breathe.

## 2014-02-23 NOTE — ED Notes (Signed)
Pt states she can not endure the anxiety of waiting for the radiology report. Pt signed out AMA & plans to have her PCP checked the results this morning at her appt. Pt ambulated out of the ED w/ NAD noted.

## 2014-02-23 NOTE — ED Notes (Signed)
When pt was asked to get a urine sample was told by mother that it was a PTSD trigger & that any testing had to be done by blood.

## 2014-02-23 NOTE — ED Provider Notes (Signed)
CSN: [id number]     Arrival date & time 02/23/14  0111 History   First MD Initiated Contact with Patient 02/23/14 0201     Chief Complaint  Patient presents with  . Back Pain     (Consider location/radiation/quality/duration/timing/severity/associated sxs/prior Treatment) HPI patient reports she woke up yesterday with upper back pain. Also has anterior chest pain and states it feels like "my ribs have fallen in". Her sister states the pain is like "bones being broken in 2". She has had a cough for a week that is dry without fever. She states she has been wheezing. She denies any palpitations. She states she's been having some twitching of the muscles in her chest. She denies any numbness or tingling of her extremities.  Patient has PTSD and she has a sister in the room and also a friend who is her medical POA who were also telling me her symptoms and how she feels. When I first entered the room the patient would not talk to me. She did have her start talking she is very easily agitated and angry. She states she has chronic low back pain and she was told "you are so skinny and lay on your side which causes your back to hurt". Patient also states "my mother didn't give a. about [place]" and indicates her sister.  They are also upset because they called before they came and were told "the emergency department was empty". They were upset because she had to wait to be seen.   [facility]  Past Medical History  Diagnosis Date  . PTSD (post-traumatic stress disorder)   . Bipolar 1 disorder   . Depression   . Anxiety   . Back pain   . Agoraphobia    History reviewed. No pertinent past surgical history. Family History  Problem Relation Age of Onset  . Arrhythmia Mother    History  Substance Use Topics  . Smoking status: Current Some Day Smoker    Types: Cigarettes  . Smokeless tobacco: Not on file  . Alcohol Use: Yes     Comment: occ   Lives with her friend who is  also her medical POA  OB History    No data available     Review of Systems  All other systems reviewed and are negative.     Allergies  Adhesive; Blue dyes (parenteral); Contrast media; Corn-containing products; Green dyes; Nickel; Onion; Other; Cortisporin; and Petroleum distillate  Home Medications   Prior to Admission medications   Medication Sig Start Date End Date Taking? Authorizing Provider  carvedilol (COREG) 3.125 MG tablet Take 1 tablet (3.125 mg total) by mouth 2 (two) times daily. 12/13/13  Yes Candee Furbish, MD  isosorbide mononitrate (IMDUR) 60 MG 24 hr tablet Take 1 tablet (60 mg total) by mouth daily. 09/14/13  Yes Candee Furbish, MD  nitroGLYCERIN (NITROSTAT) 0.4 MG SL tablet Place 1 tablet (0.4 mg total) under the tongue every 5 (five) minutes as needed for chest pain. 04/29/13  Yes Candee Furbish, MD  propranolol (INDERAL) 20 MG tablet Take 1 tablet (20 mg total) by mouth 3 (three) times daily. 04/24/13  Yes Brittainy Erie Noe, PA-C  amoxicillin-clavulanate (AUGMENTIN) 250-62.5 MG/5ML suspension Take 10 mLs (500 mg total) by mouth 3 (three) times daily. 01/20/14   Celesta Gentile, DPM  cephALEXin (KEFLEX) 500 MG capsule Take 500 mg by mouth 2 (two) times daily.    Historical Provider, MD  hydrOXYzine (ATARAX) 10 MG/5ML syrup Take 10  mg by mouth 3 (three) times daily as needed for itching, anxiety or vomiting.    Historical Provider, MD  mupirocin cream (BACTROBAN) 2 % Apply 1 application topically 2 (two) times daily. 12/28/13   Celesta Gentile, DPM  silver sulfADIAZINE (SILVADENE) 1 % cream Apply 1 application topically daily. 01/04/14   Celesta Gentile, DPM   BP 138/80 mmHg  Pulse 94  Temp(Src) 99 F (37.2 C) (Oral)  Resp 18  Ht 5\' 1"  (1.549 m)  Wt 115 lb (52.164 kg)  BMI 21.74 kg/m2  SpO2 100%  LMP 02/02/2014  Vital signs normal   Physical Exam  Constitutional: She is oriented to person, place, and time. She appears well-developed and well-nourished.  Non-toxic  appearance. She does not appear ill. No distress.  HENT:  Head: Normocephalic and atraumatic.  Right Ear: External ear normal.  Left Ear: External ear normal.  Nose: Nose normal. No mucosal edema or rhinorrhea.  Mouth/Throat: Oropharynx is clear and moist and mucous membranes are normal. No dental abscesses or uvula swelling.  Eyes: Conjunctivae and EOM are normal. Pupils are equal, round, and reactive to light.  Neck: Normal range of motion and full passive range of motion without pain. Neck supple.  Cardiovascular: Normal rate, regular rhythm and normal heart sounds.  Exam reveals no gallop and no friction rub.   No murmur heard. Pulmonary/Chest: Effort normal and breath sounds normal. No respiratory distress. She has no wheezes. She has no rhonchi. She has no rales. She exhibits no tenderness and no crepitus.  Abdominal: Soft. Normal appearance and bowel sounds are normal. She exhibits no distension. There is no tenderness. There is no rebound and no guarding.  Musculoskeletal: Normal range of motion. She exhibits no edema or tenderness.       Back:  Moves all extremities well. Patient has tenderness in her mid thoracic spine palpation. There is no step-offs or deformity felt. Patient states she feels like there is a bone that is popping out.  Neurological: She is alert and oriented to person, place, and time. She has normal strength. No cranial nerve deficit.  Skin: Skin is warm, dry and intact. No rash noted. No erythema. There is pallor.  Psychiatric: She has a normal mood and affect. Her speech is normal and behavior is normal. Her mood appears not anxious.  Nursing note and vitals reviewed.   ED Course  Procedures (including critical care time)  Patient refused pain medication.  After patient had her x-ray she did not want to wait for the radiologist to read the films. She states she will go to her PCP to get the results.   Labs Review Labs Reviewed - No data to  display  Imaging Review Dg Chest 2 View  02/23/2014   CLINICAL DATA:  Chest pain for 2 days.  No known injury.  EXAM: CHEST  2 VIEW  COMPARISON:  05/03/2013  FINDINGS: Lateral imaging limited by arms down position. Normal heart size and mediastinal contours. No acute infiltrate or edema. No effusion or pneumothorax. No acute osseous findings.  IMPRESSION: Negative chest.   Electronically Signed   By: Jorje Guild M.D.   On: 02/23/2014 04:29   Dg Thoracic Spine W/swimmers  02/23/2014   CLINICAL DATA:  Chest pain and upper back pain for 2 days. No known injury.  EXAM: THORACIC SPINE - 2 VIEW + SWIMMERS  COMPARISON:  None.  FINDINGS: There is no evidence of thoracic spine fracture. Alignment is normal. No other significant bone abnormalities are  identified.  IMPRESSION: Negative.   Electronically Signed   By: Jorje Guild M.D.   On: 02/23/2014 04:28     EKG Interpretation None      MDM   Final diagnoses:  Chest pain  Midline thoracic back pain    Pt left AMA  Rolland Porter, MD, Abram Sander     Janice Norrie, MD 02/23/14 [phone]

## 2014-03-02 DIAGNOSIS — M9902 Segmental and somatic dysfunction of thoracic region: Secondary | ICD-10-CM | POA: Diagnosis not present

## 2014-03-02 DIAGNOSIS — M546 Pain in thoracic spine: Secondary | ICD-10-CM | POA: Diagnosis not present

## 2014-03-02 DIAGNOSIS — M62830 Muscle spasm of back: Secondary | ICD-10-CM | POA: Diagnosis not present

## 2014-03-02 DIAGNOSIS — M9903 Segmental and somatic dysfunction of lumbar region: Secondary | ICD-10-CM | POA: Diagnosis not present

## 2014-05-05 ENCOUNTER — Ambulatory Visit (INDEPENDENT_AMBULATORY_CARE_PROVIDER_SITE_OTHER): Payer: Medicaid Other | Admitting: Otolaryngology

## 2014-05-06 ENCOUNTER — Encounter: Payer: Self-pay | Admitting: Cardiology

## 2014-05-10 ENCOUNTER — Other Ambulatory Visit (HOSPITAL_COMMUNITY): Payer: Self-pay | Admitting: Nurse Practitioner

## 2014-05-10 DIAGNOSIS — R1084 Generalized abdominal pain: Secondary | ICD-10-CM

## 2014-05-13 ENCOUNTER — Ambulatory Visit (HOSPITAL_COMMUNITY)
Admission: RE | Admit: 2014-05-13 | Discharge: 2014-05-13 | Disposition: A | Discharge status: Home / Self Care | Payer: Medicaid Other | Source: Ambulatory Visit | Attending: Nurse Practitioner | Admitting: Nurse Practitioner

## 2014-05-13 ENCOUNTER — Ambulatory Visit (HOSPITAL_COMMUNITY): Admission: RE | Admit: 2014-05-13 | Payer: Medicaid Other | Source: Ambulatory Visit

## 2014-05-13 DIAGNOSIS — R1084 Generalized abdominal pain: Secondary | ICD-10-CM | POA: Diagnosis present

## 2014-05-15 ENCOUNTER — Emergency Department (HOSPITAL_COMMUNITY)
Admission: EM | Admit: 2014-05-15 | Discharge: 2014-05-15 | Discharge status: Against Medical Advice | Payer: Medicaid Other | Attending: Emergency Medicine | Admitting: Emergency Medicine

## 2014-05-15 ENCOUNTER — Encounter (HOSPITAL_COMMUNITY): Payer: Self-pay | Admitting: Emergency Medicine

## 2014-05-15 DIAGNOSIS — Z79899 Other long term (current) drug therapy: Secondary | ICD-10-CM | POA: Insufficient documentation

## 2014-05-15 DIAGNOSIS — Z87891 Personal history of nicotine dependence: Secondary | ICD-10-CM | POA: Diagnosis not present

## 2014-05-15 DIAGNOSIS — R1010 Upper abdominal pain, unspecified: Secondary | ICD-10-CM | POA: Insufficient documentation

## 2014-05-15 DIAGNOSIS — F319 Bipolar disorder, unspecified: Secondary | ICD-10-CM | POA: Insufficient documentation

## 2014-05-15 DIAGNOSIS — R109 Unspecified abdominal pain: Secondary | ICD-10-CM

## 2014-05-15 DIAGNOSIS — Z8739 Personal history of other diseases of the musculoskeletal system and connective tissue: Secondary | ICD-10-CM | POA: Diagnosis not present

## 2014-05-15 DIAGNOSIS — G8929 Other chronic pain: Secondary | ICD-10-CM

## 2014-05-15 DIAGNOSIS — F419 Anxiety disorder, unspecified: Secondary | ICD-10-CM | POA: Insufficient documentation

## 2014-05-15 HISTORY — DX: Unspecified abdominal pain: R10.9

## 2014-05-15 HISTORY — DX: Chest pain, unspecified: R07.9

## 2014-05-15 HISTORY — DX: Scoliosis, unspecified: M41.9

## 2014-05-15 HISTORY — DX: Other chronic pain: G89.29

## 2014-05-15 NOTE — ED Provider Notes (Signed)
CSN: [id number]     Arrival date & time 05/15/14  1248 History   First MD Initiated Contact with Patient 05/15/14 1326     Chief Complaint  Patient presents with  . Abdominal Pain      HPI Pt was seen at 1330.  Per pt and her POA, c/o gradual onset and persistence of constant acute flair of her chronic upper abd "pain" for the past 1+ year, worse over the past 6 months. Pt's POA states pt's pain "increases even with just a sip of water." Pt has been evaluated in the ED and by her PMD multiple times for same without definitive dx. Pt had an US performed 2 days ago which was reassuring. Denies N/V/D, no back pain, no CP/SOB, no fevers, no black or blood in stools.    Past Medical History  Diagnosis Date  . PTSD (post-traumatic stress disorder)   . Bipolar 1 disorder   . Depression   . Anxiety   . Back pain   . Agoraphobia   . Scoliosis   . Chronic abdominal pain   . Chronic chest pain    History reviewed. No pertinent past surgical history.   Family History  Problem Relation Age of Onset  . Arrhythmia Mother    History  Substance Use Topics  . Smoking status: Former Smoker -- 0.01 packs/day for 2 years    Types: Cigarettes    Quit date: 02/14/2014  . Smokeless tobacco: Never Used  . Alcohol Use: Yes     Comment: occ    Review of Systems ROS: Statement: All systems negative except as marked or noted in the HPI; Constitutional: Negative for fever and chills. ; ; Eyes: Negative for eye pain, redness and discharge. ; ; ENMT: Negative for ear pain, hoarseness, nasal congestion, sinus pressure and sore throat. ; ; Cardiovascular: Negative for chest pain, palpitations, diaphoresis, dyspnea and peripheral edema. ; ; Respiratory: Negative for cough, wheezing and stridor. ; ; Gastrointestinal: +abd pain. Negative for nausea, vomiting, diarrhea, blood in stool, hematemesis, jaundice and rectal bleeding. . ; ; Genitourinary: Negative for dysuria, flank pain and hematuria. ; ;  Musculoskeletal: Negative for back pain and neck pain. Negative for swelling and trauma.; ; Skin: Negative for pruritus, rash, abrasions, blisters, bruising and skin lesion.; ; Neuro: Negative for headache, lightheadedness and neck stiffness. Negative for weakness, altered level of consciousness , altered mental status, extremity weakness, paresthesias, involuntary movement, seizure and syncope.      Allergies  Adhesive; Blue dyes (parenteral); Contrast media; Corn-containing products; Green dyes; Nickel; Onion; Other; Cortisporin; and Petroleum distillate  Home Medications   Prior to Admission medications   Medication Sig Start Date End Date Taking? Authorizing Provider  albuterol (PROVENTIL HFA;VENTOLIN HFA) 108 (90 BASE) MCG/ACT inhaler Inhale 1-2 puffs into the lungs every 4 (four) hours as needed for wheezing or shortness of breath.   Yes Historical Provider, MD  carvedilol (COREG) 3.125 MG tablet Take 1 tablet (3.125 mg total) by mouth 2 (two) times daily. 12/13/13  Yes Candee Furbish, MD  cyclobenzaprine (FLEXERIL) 5 MG tablet Take 5 mg by mouth 3 (three) times daily as needed for muscle spasms.   Yes Historical Provider, MD  dicyclomine (BENTYL) 10 MG capsule Take 10 mg by mouth 4 (four) times daily -  before meals and at bedtime. For 10 days.   Yes Historical Provider, MD  EPINEPHrine 0.3 mg/0.3 mL IJ SOAJ injection Inject 0.3 mg into the muscle once.   Yes Historical Provider,  MD  hydrOXYzine (ATARAX) 10 MG/5ML syrup Take 25 mg by mouth 3 (three) times daily.    Yes Historical Provider, MD  isosorbide mononitrate (IMDUR) 60 MG 24 hr tablet Take 1 tablet (60 mg total) by mouth daily. 09/14/13  Yes Candee Furbish, MD  nitroGLYCERIN (NITROSTAT) 0.4 MG SL tablet Place 1 tablet (0.4 mg total) under the tongue every 5 (five) minutes as needed for chest pain. 04/29/13  Yes Candee Furbish, MD  propranolol (INDERAL) 20 MG tablet Take 1 tablet (20 mg total) by mouth 3 (three) times daily. 04/24/13  Yes  Brittainy Erie Noe, PA-C  amoxicillin-clavulanate (AUGMENTIN) 250-62.5 MG/5ML suspension Take 10 mLs (500 mg total) by mouth 3 (three) times daily. Patient not taking: Reported on 05/15/2014 01/20/14   Celesta Gentile, DPM  mupirocin cream (BACTROBAN) 2 % Apply 1 application topically 2 (two) times daily. Patient not taking: Reported on 05/15/2014 12/28/13   Celesta Gentile, DPM  silver sulfADIAZINE (SILVADENE) 1 % cream Apply 1 application topically daily. Patient not taking: Reported on 05/15/2014 01/04/14   Celesta Gentile, DPM   BP 121/89 mmHg  Pulse 107  Temp(Src) 97.8 F (36.6 C) (Oral)  Resp 16  SpO2 100% Physical Exam  1335; Physical examination:  Nursing notes reviewed; Vital signs and O2 SAT reviewed;  Constitutional: Well developed, Well nourished, Well hydrated, In no acute distress; Head:  Normocephalic, atraumatic; Eyes: EOMI, PERRL, No scleral icterus; ENMT: Mouth and pharynx normal, Mucous membranes moist; Neck: Supple, Full range of motion, No lymphadenopathy; Cardiovascular: Regular rate and rhythm, No murmur, rub, or gallop; Respiratory: Breath sounds clear & equal bilaterally, No rales, rhonchi, wheezes.  Speaking full sentences with ease, Normal respiratory effort/excursion; Chest: Nontender, Movement normal; Abdomen: Soft, +RUQ, mid-epigastric, LUQ tenderness to palp. No rebound or guarding. Nondistended, Normal bowel sounds; Genitourinary: No CVA tenderness; Extremities: Pulses normal, No tenderness, No edema, No calf edema or asymmetry.; Neuro: AA&Ox3, Major CN grossly intact.  Speech clear. No gross focal motor or sensory deficits in extremities.; Skin: Color normal, Warm, Dry.; Psych:  Anxious, easily agitated, poor eye contact.    ED Course  Procedures     EKG Interpretation None      MDM  MDM Reviewed: previous chart, nursing note and vitals Reviewed previous: labs and ultrasound   US Abdomen Complete 05/13/2014   CLINICAL DATA:  Abdominal pain.  EXAM:  ULTRASOUND ABDOMEN COMPLETE  COMPARISON:  None.  FINDINGS: Gallbladder: No gallstones or wall thickening visualized. No sonographic Murphy sign noted.  Common bile duct: Diameter: 5.0 mm  Liver: No focal lesion identified. Within normal limits in parenchymal echogenicity.  IVC: No abnormality visualized.  Pancreas: Visualized portion unremarkable.  Spleen: Size and appearance within normal limits.  Right Kidney: Length: 9.8 cm. Echogenicity within normal limits. No mass or hydronephrosis visualized.  Left Kidney: Length: 9.7 cm. Echogenicity within normal limits. No mass or hydronephrosis visualized.  Abdominal aorta: No aneurysm visualized.  Other findings: None.  IMPRESSION: Normal exam.   Electronically Signed   By: Marcello Moores  Register   On: 05/13/2014 13:41      1350:  Pt states she is here in the ED today because she "wants an answer for this right now!"  Very long d/w pt and her POA/guardian regarding her chronic symptoms and what the ED could offer in the way of dx testing today.  Appears very frustrated; perseverates regarding her gallbladder as the source for her chronic abd discomfort, requesting multiple tests that the ED does not perform (HIDA scan,  EGD, etc) but then states "well I can't have them done anyway due to my allergies."  Refuses any medications offered. Refuses labs, UA, and CT scan that I offered. I questioned what her expectation for today's ED visit was and she stated "to find out what's wrong with me!" I again reiterated the above and offered labs/UA/CT A/P again. Pt again refuses all meds and all dx testing. I offered her referral to local GI MD. Pt's POA thanked me. Pt was seen leaving the ED.   Francine Graven, DO 05/17/14 [phone]

## 2014-05-15 NOTE — ED Notes (Signed)
Patient and POA left ER without telling staff, after being assessed by Dr Thurnell Garbe. Dr Thurnell Garbe made aware.

## 2014-05-15 NOTE — ED Notes (Addendum)
Per POA patient c/o intermittent abd pain x6 months progressively getting worse x2 months. Per POA patient started vomiting last night. Patient reports that pain increases with eating or drinking. Per patient abd pain right upper and mid, unable to tolerate palpation. Patient has extreme PTSD and anxiety around people per POA she needs to be with patient at all times. Patient had ultrasound here per PCP on Friday.

## 2014-05-16 ENCOUNTER — Encounter (HOSPITAL_COMMUNITY): Payer: Self-pay | Admitting: Emergency Medicine

## 2014-05-16 ENCOUNTER — Emergency Department (HOSPITAL_COMMUNITY): Payer: Medicaid Other

## 2014-05-16 ENCOUNTER — Emergency Department (HOSPITAL_COMMUNITY)
Admission: EM | Admit: 2014-05-16 | Discharge: 2014-05-16 | Disposition: A | Discharge status: Home / Self Care | Payer: Medicaid Other | Attending: Emergency Medicine | Admitting: Emergency Medicine

## 2014-05-16 DIAGNOSIS — F319 Bipolar disorder, unspecified: Secondary | ICD-10-CM | POA: Diagnosis not present

## 2014-05-16 DIAGNOSIS — Z792 Long term (current) use of antibiotics: Secondary | ICD-10-CM | POA: Diagnosis not present

## 2014-05-16 DIAGNOSIS — G8929 Other chronic pain: Secondary | ICD-10-CM | POA: Diagnosis not present

## 2014-05-16 DIAGNOSIS — Z79899 Other long term (current) drug therapy: Secondary | ICD-10-CM | POA: Insufficient documentation

## 2014-05-16 DIAGNOSIS — Z87891 Personal history of nicotine dependence: Secondary | ICD-10-CM | POA: Insufficient documentation

## 2014-05-16 DIAGNOSIS — R109 Unspecified abdominal pain: Secondary | ICD-10-CM | POA: Diagnosis not present

## 2014-05-16 DIAGNOSIS — F419 Anxiety disorder, unspecified: Secondary | ICD-10-CM | POA: Diagnosis not present

## 2014-05-16 DIAGNOSIS — R0602 Shortness of breath: Secondary | ICD-10-CM | POA: Insufficient documentation

## 2014-05-16 DIAGNOSIS — Z8739 Personal history of other diseases of the musculoskeletal system and connective tissue: Secondary | ICD-10-CM | POA: Diagnosis not present

## 2014-05-16 DIAGNOSIS — F4310 Post-traumatic stress disorder, unspecified: Secondary | ICD-10-CM | POA: Diagnosis not present

## 2014-05-16 LAB — COMPREHENSIVE METABOLIC PANEL
ALT: 21 U/L (ref 0–35)
AST: 24 U/L (ref 0–37)
Albumin: 4.9 g/dL (ref 3.5–5.2)
Alkaline Phosphatase: 60 U/L (ref 39–117)
Anion gap: 10 (ref 5–15)
BUN: 9 mg/dL (ref 6–23)
CO2: 23 mmol/L (ref 19–32)
Calcium: 9.7 mg/dL (ref 8.4–10.5)
Chloride: 106 mmol/L (ref 96–112)
Creatinine, Ser: 0.56 mg/dL (ref 0.50–1.10)
GFR calc Af Amer: >90 mL/min (ref >90)
GFR calc non Af Amer: >90 mL/min (ref >90)
Glucose, Bld: 92 mg/dL (ref 70–99)
Potassium: 3.6 mmol/L (ref 3.5–5.1)
Sodium: 139 mmol/L (ref 135–145)
Total Bilirubin: 0.8 mg/dL (ref 0.3–1.2)
Total Protein: 8.7 g/dL — ABNORMAL HIGH (ref 6.0–8.3)

## 2014-05-16 LAB — I-STAT BETA HCG BLOOD, ED (MC, WL, AP ONLY): I-stat hCG, quantitative: <5 m[IU]/mL (ref <5)

## 2014-05-16 LAB — CBC WITH DIFFERENTIAL/PLATELET
Basophils Absolute: 0 10*3/uL (ref 0.0–0.1)
Basophils Relative: 0 % (ref 0–1)
Eosinophils Absolute: 0.1 10*3/uL (ref 0.0–0.7)
Eosinophils Relative: 2 % (ref 0–5)
HCT: 42.2 % (ref 36.0–46.0)
Hemoglobin: 14.6 g/dL (ref 12.0–15.0)
Lymphocytes Relative: 43 % (ref 12–46)
Lymphs Abs: 3 10*3/uL (ref 0.7–4.0)
MCH: 29.6 pg (ref 26.0–34.0)
MCHC: 34.6 g/dL (ref 30.0–36.0)
MCV: 85.4 fL (ref 78.0–100.0)
Monocytes Absolute: 0.5 10*3/uL (ref 0.1–1.0)
Monocytes Relative: 7 % (ref 3–12)
Neutro Abs: 3.3 10*3/uL (ref 1.7–7.7)
Neutrophils Relative %: 48 % (ref 43–77)
Platelets: 274 10*3/uL (ref 150–400)
RBC: 4.94 MIL/uL (ref 3.87–5.11)
RDW: 11.8 % (ref 11.5–15.5)
WBC: 6.8 10*3/uL (ref 4.0–10.5)

## 2014-05-16 LAB — LIPASE, BLOOD: Lipase: 28 U/L (ref 11–59)

## 2014-05-16 NOTE — ED Notes (Signed)
Pt refused to obtain urine specimen.

## 2014-05-16 NOTE — ED Notes (Signed)
Pt reports RLQ abdominal pain intermittently for over 1 year. Pt sts "they wont look at my appendix" pt very concerned this may be her appendix because her mother had a similar experience. Pt unable to eat or drink x 2 days d/ pain. Pt having nvd. Denies urinary sx.

## 2014-05-16 NOTE — Discharge Instructions (Signed)
Read the information below.  You may return to the Emergency Department at any time for worsening condition or any new symptoms that concern you.  If you develop high fevers, worsening abdominal pain, uncontrolled vomiting, or are unable to tolerate fluids by mouth, return to the ER for a recheck.     Abdominal Pain Many things can cause abdominal pain. Usually, abdominal pain is not caused by a disease and will improve without treatment. It can often be observed and treated at home. Your health care provider will do a physical exam and possibly order blood tests and X-rays to help determine the seriousness of your pain. However, in many cases, more time must pass before a clear cause of the pain can be found. Before that point, your health care provider may not know if you need more testing or further treatment. HOME CARE INSTRUCTIONS  Monitor your abdominal pain for any changes. The following actions may help to alleviate any discomfort you are experiencing:  Only take over-the-counter or prescription medicines as directed by your health care provider.  Do not take laxatives unless directed to do so by your health care provider.  Try a clear liquid diet (broth, tea, or water) as directed by your health care provider. Slowly move to a bland diet as tolerated. SEEK MEDICAL CARE IF:  You have unexplained abdominal pain.  You have abdominal pain associated with nausea or diarrhea.  You have pain when you urinate or have a bowel movement.  You experience abdominal pain that wakes you in the night.  You have abdominal pain that is worsened or improved by eating food.  You have abdominal pain that is worsened with eating fatty foods.  You have a fever. SEEK IMMEDIATE MEDICAL CARE IF:   Your pain does not go away within 2 hours.  You keep throwing up (vomiting).  Your pain is felt only in portions of the abdomen, such as the right side or the left lower portion of the abdomen.  You pass  bloody or black tarry stools. MAKE SURE YOU:  Understand these instructions.   Will watch your condition.   Will get help right away if you are not doing well or get worse.  Document Released: 12/05/2004 Document Revised: 03/02/2013 Document Reviewed: 11/04/2012 [facility] Patient Information 2015 [place], [place]. This information is not intended to replace advice given to you by your health care provider. Make sure you discuss any questions you have with your health care provider.
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
## 2014-05-16 NOTE — ED Notes (Signed)
Pt is not collecting a urine sample. Pt POA stated that it is a PTSD trigger if someone ask her to collect a urine sample. Pt has to have lights turned off because she is allergic to them. Pt POA at bedside. Pt also stated that she is allergic to the tape on EKG and refused to have anything that was a sticker placed on her. Pt has been seen multiple times for the same pain but stated that they have not check her appendix and that everyone in her family has had their appendix out. Unable to have CT scans d/t allergies.

## 2014-05-16 NOTE — ED Notes (Signed)
Pt. Left with all belongings and refused wheelchair 

## 2014-05-18 ENCOUNTER — Other Ambulatory Visit (HOSPITAL_COMMUNITY): Payer: Self-pay | Admitting: Radiology

## 2014-05-18 DIAGNOSIS — J45909 Unspecified asthma, uncomplicated: Secondary | ICD-10-CM

## 2014-05-27 ENCOUNTER — Encounter (HOSPITAL_COMMUNITY): Payer: Medicaid Other

## 2014-06-01 ENCOUNTER — Inpatient Hospital Stay (HOSPITAL_COMMUNITY): Admission: RE | Admit: 2014-06-01 | Payer: Medicaid Other | Source: Ambulatory Visit

## 2014-06-05 ENCOUNTER — Emergency Department (HOSPITAL_COMMUNITY)
Admission: EM | Admit: 2014-06-05 | Discharge: 2014-06-05 | Disposition: A | Discharge status: Home / Self Care | Payer: Medicaid Other | Attending: Emergency Medicine | Admitting: Emergency Medicine

## 2014-06-05 ENCOUNTER — Encounter (HOSPITAL_COMMUNITY): Payer: Self-pay | Admitting: Emergency Medicine

## 2014-06-05 DIAGNOSIS — Z8739 Personal history of other diseases of the musculoskeletal system and connective tissue: Secondary | ICD-10-CM | POA: Insufficient documentation

## 2014-06-05 DIAGNOSIS — Y9289 Other specified places as the place of occurrence of the external cause: Secondary | ICD-10-CM | POA: Diagnosis not present

## 2014-06-05 DIAGNOSIS — Z792 Long term (current) use of antibiotics: Secondary | ICD-10-CM | POA: Diagnosis not present

## 2014-06-05 DIAGNOSIS — G8929 Other chronic pain: Secondary | ICD-10-CM | POA: Insufficient documentation

## 2014-06-05 DIAGNOSIS — Y9389 Activity, other specified: Secondary | ICD-10-CM | POA: Diagnosis not present

## 2014-06-05 DIAGNOSIS — Z8659 Personal history of other mental and behavioral disorders: Secondary | ICD-10-CM | POA: Diagnosis not present

## 2014-06-05 DIAGNOSIS — S30861A Insect bite (nonvenomous) of abdominal wall, initial encounter: Secondary | ICD-10-CM | POA: Insufficient documentation

## 2014-06-05 DIAGNOSIS — Y998 Other external cause status: Secondary | ICD-10-CM | POA: Diagnosis not present

## 2014-06-05 DIAGNOSIS — Z87891 Personal history of nicotine dependence: Secondary | ICD-10-CM | POA: Diagnosis not present

## 2014-06-05 DIAGNOSIS — Z79899 Other long term (current) drug therapy: Secondary | ICD-10-CM | POA: Diagnosis not present

## 2014-06-05 DIAGNOSIS — W57XXXA Bitten or stung by nonvenomous insect and other nonvenomous arthropods, initial encounter: Secondary | ICD-10-CM | POA: Diagnosis not present

## 2014-06-05 MED ORDER — DOXYCYCLINE HYCLATE 100 MG PO TABS
100.0000 mg | ORAL_TABLET | Freq: Once | ORAL | Status: AC
  Administered 2014-06-05: 100 mg via ORAL
  Filled 2014-06-05: qty 1

## 2014-06-05 MED ORDER — DOXYCYCLINE HYCLATE 100 MG PO CAPS: 100.0000 mg | ORAL_CAPSULE | Freq: Two times a day (BID) | ORAL | Status: DC

## 2014-06-05 NOTE — Discharge Instructions (Signed)
Please cleanse the wound with soap and water daily. Please see your physician, or return to the emergency department if any red streaking, or signs of advancing infection. Please use doxycycline 2 times daily with food.

## 2014-06-05 NOTE — ED Notes (Signed)
Patient complaining of tick bite. States she pulled tick off 3 days ago. Complaining of dark purple area to right side where tick was and itching.

## 2014-06-05 NOTE — ED Provider Notes (Signed)
CSN: [id number]     Arrival date & time 06/05/14  1343 History  This chart was scribed for Lily Kocher, PA-C, working with No att. providers found by Girtha Hake, ED Scribe. The patient was seen in APFT20/APFT20. The patient's care was started at 3:01 PM.   Chief Complaint  Patient presents with  . Tick Removal   The history is provided by the patient. No language interpreter was used.   HPI Comments: Traci Hensley is a 24 y.o. female who presents to the Emergency Department complaining of a tick bite on her right lower abdomen that occurred 2-3 days ago. Patient reports that she removed the tick on her own. She reports associated itching. Patient's mother reports that the area surrounding the tick bite was originally pink but has since turned purple. Patient denies rash or vomiting.   PCP is at [facility] [place].  Past Medical History  Diagnosis Date  . PTSD (post-traumatic stress disorder)   . Bipolar 1 disorder   . Depression   . Anxiety   . Back pain   . Agoraphobia   . Scoliosis   . Chronic abdominal pain   . Chronic chest pain    History reviewed. No pertinent past surgical history. Family History  Problem Relation Age of Onset  . Arrhythmia Mother    History  Substance Use Topics  . Smoking status: Former Smoker -- 0.01 packs/day for 2 years    Types: Cigarettes    Quit date: 02/14/2014  . Smokeless tobacco: Never Used  . Alcohol Use: Yes     Comment: occ   OB History    No data available     Review of Systems  Gastrointestinal: Negative for vomiting.  Skin: Negative for rash.       Insect bite.  Psychiatric/Behavioral: The patient is nervous/anxious.       Allergies  Adhesive; Blue dyes (parenteral); Contrast media; Corn-containing products; Green dyes; Nickel; Onion; Other; Cortisporin; and Petroleum distillate  Home Medications   Prior to Admission medications   Medication Sig Start Date End Date Taking? Authorizing  Provider  albuterol (PROVENTIL HFA;VENTOLIN HFA) 108 (90 BASE) MCG/ACT inhaler Inhale 1-2 puffs into the lungs every 4 (four) hours as needed for wheezing or shortness of breath.   Yes Historical Provider, MD  carvedilol (COREG) 3.125 MG tablet Take 1 tablet (3.125 mg total) by mouth 2 (two) times daily. 12/13/13  Yes Jerline Pain, MD  cyclobenzaprine (FLEXERIL) 5 MG tablet Take 5 mg by mouth 3 (three) times daily as needed for muscle spasms.   Yes Historical Provider, MD  EPINEPHrine 0.3 mg/0.3 mL IJ SOAJ injection Inject 0.3 mg into the muscle once.   Yes Historical Provider, MD  isosorbide mononitrate (IMDUR) 60 MG 24 hr tablet Take 1 tablet (60 mg total) by mouth daily. 09/14/13  Yes Jerline Pain, MD  nitroGLYCERIN (NITROSTAT) 0.4 MG SL tablet Place 1 tablet (0.4 mg total) under the tongue every 5 (five) minutes as needed for chest pain. 04/29/13  Yes Jerline Pain, MD  propranolol (INDERAL) 20 MG tablet Take 1 tablet (20 mg total) by mouth 3 (three) times daily. 04/24/13  Yes Brittainy Erie Noe, PA-C  amoxicillin-clavulanate (AUGMENTIN) 250-62.5 MG/5ML suspension Take 10 mLs (500 mg total) by mouth 3 (three) times daily. Patient not taking: Reported on 05/15/2014 01/20/14   Trula Slade, DPM  mupirocin cream (BACTROBAN) 2 % Apply 1 application topically 2 (two) times daily. Patient not  taking: Reported on 05/15/2014 12/28/13   Trula Slade, DPM  silver sulfADIAZINE (SILVADENE) 1 % cream Apply 1 application topically daily. Patient not taking: Reported on 05/15/2014 01/04/14   Trula Slade, DPM   Triage Vitals: BP 126/83 mmHg  Pulse 102  Temp(Src) 98.1 F (36.7 C) (Oral)  Resp 20  Ht 5\' 1"  (1.549 m)  Wt 111 lb (50.349 kg)  BMI 20.98 kg/m2  SpO2 100%  LMP 05/06/2014 (Approximate) Physical Exam  Constitutional: She is oriented to person, place, and time. She appears well-developed and well-nourished. No distress.  HENT:  Head: Normocephalic and atraumatic.  Eyes: Conjunctivae  and EOM are normal.  Neck: Neck supple. No tracheal deviation present.  Cardiovascular: Normal rate.   Pulmonary/Chest: Effort normal. No respiratory distress.  Musculoskeletal: Normal range of motion.  Neurological: She is alert and oriented to person, place, and time.  Skin: Skin is warm and dry.  1 cm oval, pink to purple colored lesion on right lower abdomen. Has non distinct borders. There is a scabbed area in the center identified under magnification. No pustules. No satellite lesions. No active bleeding.   Psychiatric: She has a normal mood and affect. Her behavior is normal.  Nursing note and vitals reviewed.   ED Course  Procedures (including critical care time) DIAGNOSTIC STUDIES: Oxygen Saturation is 100% on room air, normal by my interpretation.    COORDINATION OF CARE:    Labs Review Labs Reviewed - No data to display  Imaging Review No results found.   EKG Interpretation None      MDM  No residual portion of the tick remains in the wound. There appears to be a bruise at the bite site. The patient will be covered with doxycycline in the event of any signs of tick bite fever or illness.    Final diagnoses:  None    *I have reviewed nursing notes, vital signs, and all appropriate lab and imaging results for this patient.**  **I personally performed the services described in this documentation, which was scribed in my presence. The recorded information has been reviewed and is accurate.Lily Kocher, PA-C 06/05/14 [place], MD 06/06/14 [phone]

## 2014-06-08 ENCOUNTER — Ambulatory Visit: Payer: Medicaid Other | Admitting: Gastroenterology

## 2014-06-10 ENCOUNTER — Ambulatory Visit (HOSPITAL_COMMUNITY)
Admission: RE | Admit: 2014-06-10 | Discharge: 2014-06-10 | Disposition: A | Discharge status: Home / Self Care | Payer: Medicare Other | Source: Ambulatory Visit | Attending: Pulmonary Disease | Admitting: Pulmonary Disease

## 2014-06-10 NOTE — Progress Notes (Signed)
Patient was arrived with lady that accompanied her and I escorted them to the pft lab.  I entered the data in the system for epic and breeze programs for preparation to do the pft.  I began to explain the procedures for the test and asked her to have a seat in the test cabinet.  I started and briefly explained the set up.  She became anxious and said she could not close off her nose and breath through her mouth.  She would not have the door closed for any part of the test; all these factors would make her pass out and she could not; would not attempt the test.  I tried to explain to her and the lady how spirometry would be done; she again said she could not; would not do the test; she wouldn't even try the set up.  I explained that my part was to work with her to try to complete the test, but I would not force her to do anything.  She cursed and said she could not/would not do the test.  She vehemently refused, got up and walked out with the lady.  Henrine Screws, RRT

## 2014-06-14 DIAGNOSIS — N926 Irregular menstruation, unspecified: Secondary | ICD-10-CM | POA: Diagnosis not present

## 2014-06-14 DIAGNOSIS — N946 Dysmenorrhea, unspecified: Secondary | ICD-10-CM | POA: Diagnosis not present

## 2014-06-17 ENCOUNTER — Encounter: Payer: Self-pay | Admitting: Cardiology

## 2014-06-17 ENCOUNTER — Ambulatory Visit (INDEPENDENT_AMBULATORY_CARE_PROVIDER_SITE_OTHER): Payer: Medicare Other | Admitting: Cardiology

## 2014-06-17 VITALS — BP 106/80 | HR 74 | Ht 61.0 in

## 2014-06-17 DIAGNOSIS — R002 Palpitations: Secondary | ICD-10-CM | POA: Diagnosis not present

## 2014-06-17 DIAGNOSIS — I471 Supraventricular tachycardia: Secondary | ICD-10-CM

## 2014-06-17 DIAGNOSIS — R0789 Other chest pain: Secondary | ICD-10-CM

## 2014-06-17 DIAGNOSIS — R000 Tachycardia, unspecified: Secondary | ICD-10-CM

## 2014-06-17 MED ORDER — PROPRANOLOL HCL 20 MG PO TABS
20.0000 mg | ORAL_TABLET | Freq: Three times a day (TID) | ORAL | Status: DC
Start: 2014-06-17 — End: 2016-06-06

## 2014-06-17 MED ORDER — NITROGLYCERIN 0.4 MG SL SUBL: 0.4000 mg | SUBLINGUAL_TABLET | SUBLINGUAL | Status: DC | PRN

## 2014-06-17 NOTE — Patient Instructions (Signed)
The current medical regimen is effective;  continue present plan and medications.  Follow up in 6 months with Dr. Skains.  You will receive a letter in the mail 2 months before you are due.  Please call us when you receive this letter to schedule your follow up appointment.  Thank you for choosing [facility]!!     
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
## 2014-06-29 ENCOUNTER — Encounter: Payer: Self-pay | Admitting: Gastroenterology

## 2014-06-29 ENCOUNTER — Other Ambulatory Visit: Payer: Self-pay

## 2014-06-29 ENCOUNTER — Ambulatory Visit (INDEPENDENT_AMBULATORY_CARE_PROVIDER_SITE_OTHER): Payer: Medicare Other | Admitting: Gastroenterology

## 2014-06-29 VITALS — BP 110/70 | HR 97 | Temp 98.2°F

## 2014-06-29 DIAGNOSIS — R739 Hyperglycemia, unspecified: Secondary | ICD-10-CM

## 2014-06-29 DIAGNOSIS — R7309 Other abnormal glucose: Secondary | ICD-10-CM | POA: Diagnosis not present

## 2014-06-29 DIAGNOSIS — R109 Unspecified abdominal pain: Secondary | ICD-10-CM | POA: Insufficient documentation

## 2014-06-29 NOTE — Patient Instructions (Signed)
Please have blood work done today.   We have scheduled you for an upper endoscopy with Dr. Oneida Alar in the near future.
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
## 2014-07-01 ENCOUNTER — Telehealth: Payer: Self-pay

## 2014-07-01 NOTE — Telephone Encounter (Signed)
I'm routing to Dr. Oneida Alar for an Ensign.
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
## 2014-07-01 NOTE — Progress Notes (Signed)
REVIEWED.  
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
## 2014-07-01 NOTE — Telephone Encounter (Signed)
Spoke with Traci Hensley at the pre service center and she states she spoke with patients POA Traci Hensley and that the pt is wanting to cancel procedure.    Called Traci Hensley and she states the the pt did some research on the scope and that she wants to cancel because of her nickel allergy. 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

## 2014-07-01 NOTE — Telephone Encounter (Signed)
Noted  
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
## 2014-07-04 NOTE — Progress Notes (Signed)
cc'ed to pcp °

## 2014-07-08 DIAGNOSIS — R109 Unspecified abdominal pain: Secondary | ICD-10-CM | POA: Diagnosis not present

## 2014-07-08 DIAGNOSIS — R7309 Other abnormal glucose: Secondary | ICD-10-CM | POA: Diagnosis not present

## 2014-07-09 LAB — IGA: IgA: 172 mg/dL (ref 69–380)

## 2014-07-09 LAB — HEMOGLOBIN A1C
Hgb A1c MFr Bld: 5.2 % (ref <5.7)
Mean Plasma Glucose: 103 mg/dL (ref <117)

## 2014-07-11 LAB — TISSUE TRANSGLUTAMINASE, IGA: Tissue Transglutaminase Ab, IgA: 1 U/mL (ref <4)

## 2014-07-14 ENCOUNTER — Other Ambulatory Visit (HOSPITAL_COMMUNITY): Payer: Medicare Other

## 2014-07-19 ENCOUNTER — Ambulatory Visit (HOSPITAL_COMMUNITY): Admit: 2014-07-19 | Payer: Medicare Other | Admitting: Gastroenterology

## 2014-07-19 ENCOUNTER — Encounter (HOSPITAL_COMMUNITY): Payer: Self-pay

## 2014-07-19 SURGERY — ESOPHAGOGASTRODUODENOSCOPY (EGD) WITH PROPOFOL
Anesthesia: Monitor Anesthesia Care

## 2014-07-20 NOTE — Progress Notes (Signed)
Quick Note:  Negative celiac serologies. Normal A1c. ______

## 2014-07-21 NOTE — Progress Notes (Signed)
Quick Note:  I called and gave the results to POA, Lurlean Nanny. She said the EGD was cancelled, she did research on the scope and due to the allergies that the pt has, they cancelled.  She would like to make an appt to come in and discuss options.  Routing to Laban Emperor, NP, who saw the pt in the office to advise how soon to schedule an appt. ______

## 2014-07-22 DIAGNOSIS — M545 Low back pain: Secondary | ICD-10-CM | POA: Diagnosis not present

## 2014-07-22 DIAGNOSIS — M546 Pain in thoracic spine: Secondary | ICD-10-CM | POA: Diagnosis not present

## 2014-07-22 DIAGNOSIS — M9903 Segmental and somatic dysfunction of lumbar region: Secondary | ICD-10-CM | POA: Diagnosis not present

## 2014-07-22 DIAGNOSIS — M9902 Segmental and somatic dysfunction of thoracic region: Secondary | ICD-10-CM | POA: Diagnosis not present

## 2014-07-24 NOTE — Progress Notes (Signed)
Quick Note:  See Dr. Oneida Alar only. Non-urgent. ______

## 2014-07-25 DIAGNOSIS — M9903 Segmental and somatic dysfunction of lumbar region: Secondary | ICD-10-CM | POA: Diagnosis not present

## 2014-07-25 DIAGNOSIS — M546 Pain in thoracic spine: Secondary | ICD-10-CM | POA: Diagnosis not present

## 2014-07-25 DIAGNOSIS — I10 Essential (primary) hypertension: Secondary | ICD-10-CM | POA: Diagnosis not present

## 2014-07-25 DIAGNOSIS — J45909 Unspecified asthma, uncomplicated: Secondary | ICD-10-CM | POA: Diagnosis not present

## 2014-07-25 DIAGNOSIS — M9902 Segmental and somatic dysfunction of thoracic region: Secondary | ICD-10-CM | POA: Diagnosis not present

## 2014-07-25 DIAGNOSIS — M545 Low back pain: Secondary | ICD-10-CM | POA: Diagnosis not present

## 2014-07-25 NOTE — Progress Notes (Signed)
Quick Note:  Pt is scheduled an appt with Dr. Oneida Alar on 07/27/2014 at 3:00 Pm. Marijo ( POA ) is aware. ______

## 2014-07-27 ENCOUNTER — Ambulatory Visit: Payer: Medicare Other | Admitting: Gastroenterology

## 2014-07-27 NOTE — Progress Notes (Signed)
REVIEWED-NO ADDITIONAL RECOMMENDATIONS. 

## 2014-07-28 DIAGNOSIS — M9902 Segmental and somatic dysfunction of thoracic region: Secondary | ICD-10-CM | POA: Diagnosis not present

## 2014-07-28 DIAGNOSIS — M545 Low back pain: Secondary | ICD-10-CM | POA: Diagnosis not present

## 2014-07-28 DIAGNOSIS — M546 Pain in thoracic spine: Secondary | ICD-10-CM | POA: Diagnosis not present

## 2014-07-28 DIAGNOSIS — M9903 Segmental and somatic dysfunction of lumbar region: Secondary | ICD-10-CM | POA: Diagnosis not present

## 2014-08-01 DIAGNOSIS — M545 Low back pain: Secondary | ICD-10-CM | POA: Diagnosis not present

## 2014-08-01 DIAGNOSIS — M546 Pain in thoracic spine: Secondary | ICD-10-CM | POA: Diagnosis not present

## 2014-08-01 DIAGNOSIS — M9903 Segmental and somatic dysfunction of lumbar region: Secondary | ICD-10-CM | POA: Diagnosis not present

## 2014-08-01 DIAGNOSIS — M9902 Segmental and somatic dysfunction of thoracic region: Secondary | ICD-10-CM | POA: Diagnosis not present

## 2014-08-04 DIAGNOSIS — M9903 Segmental and somatic dysfunction of lumbar region: Secondary | ICD-10-CM | POA: Diagnosis not present

## 2014-08-04 DIAGNOSIS — M545 Low back pain: Secondary | ICD-10-CM | POA: Diagnosis not present

## 2014-08-04 DIAGNOSIS — M9902 Segmental and somatic dysfunction of thoracic region: Secondary | ICD-10-CM | POA: Diagnosis not present

## 2014-08-04 DIAGNOSIS — M546 Pain in thoracic spine: Secondary | ICD-10-CM | POA: Diagnosis not present

## 2014-08-09 DIAGNOSIS — M545 Low back pain: Secondary | ICD-10-CM | POA: Diagnosis not present

## 2014-08-09 DIAGNOSIS — M546 Pain in thoracic spine: Secondary | ICD-10-CM | POA: Diagnosis not present

## 2014-08-09 DIAGNOSIS — M9902 Segmental and somatic dysfunction of thoracic region: Secondary | ICD-10-CM | POA: Diagnosis not present

## 2014-08-09 DIAGNOSIS — M9903 Segmental and somatic dysfunction of lumbar region: Secondary | ICD-10-CM | POA: Diagnosis not present

## 2014-08-12 DIAGNOSIS — M9902 Segmental and somatic dysfunction of thoracic region: Secondary | ICD-10-CM | POA: Diagnosis not present

## 2014-08-12 DIAGNOSIS — M545 Low back pain: Secondary | ICD-10-CM | POA: Diagnosis not present

## 2014-08-12 DIAGNOSIS — M9903 Segmental and somatic dysfunction of lumbar region: Secondary | ICD-10-CM | POA: Diagnosis not present

## 2014-08-12 DIAGNOSIS — M546 Pain in thoracic spine: Secondary | ICD-10-CM | POA: Diagnosis not present

## 2014-08-16 DIAGNOSIS — M9902 Segmental and somatic dysfunction of thoracic region: Secondary | ICD-10-CM | POA: Diagnosis not present

## 2014-08-16 DIAGNOSIS — M9903 Segmental and somatic dysfunction of lumbar region: Secondary | ICD-10-CM | POA: Diagnosis not present

## 2014-08-16 DIAGNOSIS — M545 Low back pain: Secondary | ICD-10-CM | POA: Diagnosis not present

## 2014-08-16 DIAGNOSIS — M546 Pain in thoracic spine: Secondary | ICD-10-CM | POA: Diagnosis not present

## 2014-08-19 DIAGNOSIS — M9902 Segmental and somatic dysfunction of thoracic region: Secondary | ICD-10-CM | POA: Diagnosis not present

## 2014-08-19 DIAGNOSIS — M545 Low back pain: Secondary | ICD-10-CM | POA: Diagnosis not present

## 2014-08-19 DIAGNOSIS — M546 Pain in thoracic spine: Secondary | ICD-10-CM | POA: Diagnosis not present

## 2014-08-19 DIAGNOSIS — M9903 Segmental and somatic dysfunction of lumbar region: Secondary | ICD-10-CM | POA: Diagnosis not present

## 2014-08-23 DIAGNOSIS — M9903 Segmental and somatic dysfunction of lumbar region: Secondary | ICD-10-CM | POA: Diagnosis not present

## 2014-08-23 DIAGNOSIS — M9902 Segmental and somatic dysfunction of thoracic region: Secondary | ICD-10-CM | POA: Diagnosis not present

## 2014-08-23 DIAGNOSIS — M545 Low back pain: Secondary | ICD-10-CM | POA: Diagnosis not present

## 2014-08-23 DIAGNOSIS — M546 Pain in thoracic spine: Secondary | ICD-10-CM | POA: Diagnosis not present

## 2014-08-26 DIAGNOSIS — M545 Low back pain: Secondary | ICD-10-CM | POA: Diagnosis not present

## 2014-08-26 DIAGNOSIS — M9903 Segmental and somatic dysfunction of lumbar region: Secondary | ICD-10-CM | POA: Diagnosis not present

## 2014-08-26 DIAGNOSIS — M546 Pain in thoracic spine: Secondary | ICD-10-CM | POA: Diagnosis not present

## 2014-08-26 DIAGNOSIS — M9902 Segmental and somatic dysfunction of thoracic region: Secondary | ICD-10-CM | POA: Diagnosis not present

## 2014-08-30 DIAGNOSIS — M546 Pain in thoracic spine: Secondary | ICD-10-CM | POA: Diagnosis not present

## 2014-08-30 DIAGNOSIS — M9902 Segmental and somatic dysfunction of thoracic region: Secondary | ICD-10-CM | POA: Diagnosis not present

## 2014-08-30 DIAGNOSIS — M9903 Segmental and somatic dysfunction of lumbar region: Secondary | ICD-10-CM | POA: Diagnosis not present

## 2014-08-30 DIAGNOSIS — M545 Low back pain: Secondary | ICD-10-CM | POA: Diagnosis not present

## 2014-08-31 ENCOUNTER — Ambulatory Visit (INDEPENDENT_AMBULATORY_CARE_PROVIDER_SITE_OTHER): Payer: Medicare Other | Admitting: Gastroenterology

## 2014-08-31 ENCOUNTER — Encounter: Payer: Self-pay | Admitting: Gastroenterology

## 2014-08-31 VITALS — BP 131/82 | HR 79 | Temp 98.4°F | Ht 61.0 in

## 2014-08-31 DIAGNOSIS — R1013 Epigastric pain: Secondary | ICD-10-CM | POA: Insufficient documentation

## 2014-08-31 MED ORDER — METOCLOPRAMIDE HCL 5 MG PO TABS: ORAL_TABLET | ORAL | Status: DC

## 2014-08-31 NOTE — Assessment & Plan Note (Signed)
Associated with nausea, intermittent RUQ ABDOMINAL PAIN, AND EARLY SATIETY.  PT CANNOT/DOES NOT WANT TO  HAVE ANY ADDITIONAL IMAGING STUDIES OR ENDOSCOPY DUE TO SEVER ALLERGIES TO NICKEL, COBALT, AND ADHESIVE TAPE. PT WOULD CONSIDER EGD W/O SEDATION BUT THINKS SHE WOULD NOT BE ABLE TO TOLERATE CETACAINE OR LIDOCAINE. START REGLAN 5 MG 30 MINS PRIOR TO MEALS THREE TIMES A DAY AND AT BEDTIME. IT MAY CAUSE AGITATION, DRAINAGE FROM HER BREAST, TARDIVE DYSKINESIA, OR CHANGES IN VISION. STRONGLY ENCOURAGED PT TO SEE ANESTHESIA AT [facility] TO DISCUSS CHALLENGING ANESTHESIA NEED IN THE EVENT OF A SEMI-ELECTIVE/URGENT SURGERY. FOLLOW UP IN 4 MOS.   SHE WILL CALL WITH QUESTIONS OR CONCERNS.

## 2014-08-31 NOTE — Progress Notes (Signed)
cc'd to pcp 
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
## 2014-08-31 NOTE — Patient Instructions (Signed)
START REGLAN 5 MG 30 MINS PRIOR TO MEALS THREE TIMES A DAY TO HELP REDUCE NAUSEA, GETTING FULL FAST, AND ABDOMINAL PAIN.  REGLAN MAY CAUSE AGITATION, DROWSINESS, DRAINAGE FROM HER BREAST, DIARRHEA, ARDIVE DYSKINESIA, OR BLURRY VISION. TAKE 25 MG BENADYL OR 25-50 MG OF HYDROXYZINE IF SYMPTOMS OF TARDIVE DYSKINESIA OCCUR.  FOLLOW UP IN 4 MOS.

## 2014-09-02 DIAGNOSIS — M545 Low back pain: Secondary | ICD-10-CM | POA: Diagnosis not present

## 2014-09-02 DIAGNOSIS — M546 Pain in thoracic spine: Secondary | ICD-10-CM | POA: Diagnosis not present

## 2014-09-02 DIAGNOSIS — M9902 Segmental and somatic dysfunction of thoracic region: Secondary | ICD-10-CM | POA: Diagnosis not present

## 2014-09-02 DIAGNOSIS — M9903 Segmental and somatic dysfunction of lumbar region: Secondary | ICD-10-CM | POA: Diagnosis not present

## 2014-09-05 DIAGNOSIS — M9903 Segmental and somatic dysfunction of lumbar region: Secondary | ICD-10-CM | POA: Diagnosis not present

## 2014-09-05 DIAGNOSIS — M9902 Segmental and somatic dysfunction of thoracic region: Secondary | ICD-10-CM | POA: Diagnosis not present

## 2014-09-05 DIAGNOSIS — M546 Pain in thoracic spine: Secondary | ICD-10-CM | POA: Diagnosis not present

## 2014-09-05 DIAGNOSIS — M545 Low back pain: Secondary | ICD-10-CM | POA: Diagnosis not present

## 2014-09-08 DIAGNOSIS — M9903 Segmental and somatic dysfunction of lumbar region: Secondary | ICD-10-CM | POA: Diagnosis not present

## 2014-09-08 DIAGNOSIS — M9902 Segmental and somatic dysfunction of thoracic region: Secondary | ICD-10-CM | POA: Diagnosis not present

## 2014-09-08 DIAGNOSIS — M545 Low back pain: Secondary | ICD-10-CM | POA: Diagnosis not present

## 2014-09-08 DIAGNOSIS — M546 Pain in thoracic spine: Secondary | ICD-10-CM | POA: Diagnosis not present

## 2014-09-15 DIAGNOSIS — M9903 Segmental and somatic dysfunction of lumbar region: Secondary | ICD-10-CM | POA: Diagnosis not present

## 2014-09-15 DIAGNOSIS — M9902 Segmental and somatic dysfunction of thoracic region: Secondary | ICD-10-CM | POA: Diagnosis not present

## 2014-09-15 DIAGNOSIS — M546 Pain in thoracic spine: Secondary | ICD-10-CM | POA: Diagnosis not present

## 2014-09-15 DIAGNOSIS — M545 Low back pain: Secondary | ICD-10-CM | POA: Diagnosis not present

## 2014-09-20 DIAGNOSIS — M439 Deforming dorsopathy, unspecified: Secondary | ICD-10-CM | POA: Diagnosis not present

## 2014-09-20 DIAGNOSIS — M549 Dorsalgia, unspecified: Secondary | ICD-10-CM | POA: Diagnosis not present

## 2014-09-20 DIAGNOSIS — M546 Pain in thoracic spine: Secondary | ICD-10-CM | POA: Diagnosis not present

## 2014-09-28 DIAGNOSIS — M9902 Segmental and somatic dysfunction of thoracic region: Secondary | ICD-10-CM | POA: Diagnosis not present

## 2014-09-28 DIAGNOSIS — M9903 Segmental and somatic dysfunction of lumbar region: Secondary | ICD-10-CM | POA: Diagnosis not present

## 2014-09-28 DIAGNOSIS — M545 Low back pain: Secondary | ICD-10-CM | POA: Diagnosis not present

## 2014-09-28 DIAGNOSIS — M546 Pain in thoracic spine: Secondary | ICD-10-CM | POA: Diagnosis not present

## 2014-09-29 ENCOUNTER — Telehealth: Payer: Self-pay | Admitting: Cardiology

## 2014-09-29 NOTE — Telephone Encounter (Signed)
New message     Pt POA is wanting to see if doctor would right prescription for pt to have oxygen. Pt had dental procedure last week and was put on oxygen during and after the procedure.   Pt was feeling better, tingling stopped,  Had color in hands and feet while on oxygen and after procedure Pt POA states that pt was more alert, had more color, could take deeper breathes. Pt POA also states heart rate also regulated while on oxygen. Please call to discuss

## 2014-09-29 NOTE — Telephone Encounter (Signed)
I do not see where there is a POA or other person on record to take receive protected health care information on this person. Reviewing the chart I she has refused PFT in the past and no documented O2 need.  Forwarding to Dr. Marlou Porch and Kelli Churn to evaluate patient request

## 2014-09-30 NOTE — Telephone Encounter (Signed)
Spoke with POA - reports pt recently has oral surgery and while on 02 during the procedure and afterwards her HR "regulated"  Into the 80s when her HR is usually above 100 bpm.  She reports her improvement lasted over 24 hrs with only being on 02 for about 15 mins.  The "stars she usually sees went away" the tingling in her arms went away, her color was better, her cyanosis improved, she was more alert than ever and she didn't have to take a nap like usual.  Her PCP sent her to a pulmonologist who did a walk test and 02 sats stayed in the high 90's.  They told her because her sat didn't drop to 88% 02 would not be covered by her insurance.  I advised POA this is correct and pt has to meet certain criteria before insurance will cover.  POA feels as though because the 02 made such a huge difference in her and how she felt and looked there should be some way Dr Marlou Porch could document this and get it covered.  Advised POA Dr Marlou Porch generally doesn't order [phone] however she insist I ask him to review.  Informed her Dr Marlou Porch is out of the office through next week and I will review with him and call her back with any recommendations/orders.  She states understanding.

## 2014-10-01 NOTE — Telephone Encounter (Signed)
I agree. No requirements for O2.  Candee Furbish, MD

## 2014-10-11 NOTE — Telephone Encounter (Signed)
Pt's POA aware pt doesn't meet requirements for 02.

## 2014-10-25 DIAGNOSIS — J45909 Unspecified asthma, uncomplicated: Secondary | ICD-10-CM | POA: Diagnosis not present

## 2014-10-25 DIAGNOSIS — I10 Essential (primary) hypertension: Secondary | ICD-10-CM | POA: Diagnosis not present

## 2014-11-02 DIAGNOSIS — M9902 Segmental and somatic dysfunction of thoracic region: Secondary | ICD-10-CM | POA: Diagnosis not present

## 2014-11-02 DIAGNOSIS — M9903 Segmental and somatic dysfunction of lumbar region: Secondary | ICD-10-CM | POA: Diagnosis not present

## 2014-11-02 DIAGNOSIS — M545 Low back pain: Secondary | ICD-10-CM | POA: Diagnosis not present

## 2014-11-02 DIAGNOSIS — M546 Pain in thoracic spine: Secondary | ICD-10-CM | POA: Diagnosis not present

## 2014-11-21 ENCOUNTER — Encounter: Payer: Self-pay | Admitting: Gastroenterology

## 2014-12-27 DIAGNOSIS — D485 Neoplasm of uncertain behavior of skin: Secondary | ICD-10-CM | POA: Diagnosis not present

## 2015-02-06 DIAGNOSIS — E878 Other disorders of electrolyte and fluid balance, not elsewhere classified: Secondary | ICD-10-CM | POA: Diagnosis not present

## 2015-02-06 DIAGNOSIS — L272 Dermatitis due to ingested food: Secondary | ICD-10-CM | POA: Diagnosis not present

## 2015-02-15 DIAGNOSIS — I10 Essential (primary) hypertension: Secondary | ICD-10-CM | POA: Diagnosis not present

## 2015-02-15 DIAGNOSIS — J9611 Chronic respiratory failure with hypoxia: Secondary | ICD-10-CM | POA: Diagnosis not present

## 2015-02-15 DIAGNOSIS — J309 Allergic rhinitis, unspecified: Secondary | ICD-10-CM | POA: Diagnosis not present

## 2015-02-15 DIAGNOSIS — J45909 Unspecified asthma, uncomplicated: Secondary | ICD-10-CM | POA: Diagnosis not present

## 2015-04-03 DIAGNOSIS — R7309 Other abnormal glucose: Secondary | ICD-10-CM | POA: Diagnosis not present

## 2015-04-03 DIAGNOSIS — R5383 Other fatigue: Secondary | ICD-10-CM | POA: Diagnosis not present

## 2015-04-03 DIAGNOSIS — E782 Mixed hyperlipidemia: Secondary | ICD-10-CM | POA: Diagnosis not present

## 2015-05-24 ENCOUNTER — Encounter: Payer: Self-pay | Admitting: Cardiology

## 2015-05-24 ENCOUNTER — Ambulatory Visit (INDEPENDENT_AMBULATORY_CARE_PROVIDER_SITE_OTHER): Payer: Medicare Other | Admitting: Cardiology

## 2015-05-24 VITALS — BP 110/62 | HR 78 | Ht 61.0 in | Wt 117.0 lb

## 2015-05-24 DIAGNOSIS — R0789 Other chest pain: Secondary | ICD-10-CM | POA: Diagnosis not present

## 2015-05-24 DIAGNOSIS — R000 Tachycardia, unspecified: Secondary | ICD-10-CM | POA: Diagnosis not present

## 2015-05-24 NOTE — Patient Instructions (Signed)
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
## 2015-06-15 DIAGNOSIS — J309 Allergic rhinitis, unspecified: Secondary | ICD-10-CM | POA: Diagnosis not present

## 2015-06-15 DIAGNOSIS — I10 Essential (primary) hypertension: Secondary | ICD-10-CM | POA: Diagnosis not present

## 2015-06-15 DIAGNOSIS — J4550 Severe persistent asthma, uncomplicated: Secondary | ICD-10-CM | POA: Diagnosis not present

## 2015-06-15 DIAGNOSIS — J9611 Chronic respiratory failure with hypoxia: Secondary | ICD-10-CM | POA: Diagnosis not present

## 2015-07-24 DIAGNOSIS — T783XXA Angioneurotic edema, initial encounter: Secondary | ICD-10-CM | POA: Diagnosis not present

## 2015-11-14 ENCOUNTER — Encounter: Payer: Self-pay | Admitting: Podiatry

## 2015-11-14 ENCOUNTER — Telehealth: Payer: Self-pay | Admitting: Podiatry

## 2015-11-14 ENCOUNTER — Ambulatory Visit (HOSPITAL_BASED_OUTPATIENT_CLINIC_OR_DEPARTMENT_OTHER)
Admission: RE | Admit: 2015-11-14 | Discharge: 2015-11-14 | Disposition: A | Discharge status: Home / Self Care | Payer: Medicare Other | Source: Ambulatory Visit | Attending: Podiatry | Admitting: Podiatry

## 2015-11-14 ENCOUNTER — Ambulatory Visit (INDEPENDENT_AMBULATORY_CARE_PROVIDER_SITE_OTHER): Payer: Medicare Other | Admitting: Podiatry

## 2015-11-14 DIAGNOSIS — M792 Neuralgia and neuritis, unspecified: Secondary | ICD-10-CM

## 2015-11-14 DIAGNOSIS — R52 Pain, unspecified: Secondary | ICD-10-CM | POA: Insufficient documentation

## 2015-11-14 DIAGNOSIS — R209 Unspecified disturbances of skin sensation: Secondary | ICD-10-CM | POA: Diagnosis not present

## 2015-11-14 DIAGNOSIS — L600 Ingrowing nail: Secondary | ICD-10-CM | POA: Diagnosis not present

## 2015-11-14 DIAGNOSIS — M7662 Achilles tendinitis, left leg: Secondary | ICD-10-CM | POA: Diagnosis not present

## 2015-11-14 MED ORDER — METHYLPREDNISOLONE 4 MG PO TBPK: ORAL_TABLET | ORAL | 0 refills | Status: DC

## 2015-11-14 NOTE — Telephone Encounter (Signed)
This patient called to the on call line  at 2:02 am on Tuesday morning concerning electrical tingling.  I awoke at 6:00 am and called her back at 6:32 am.  There was a long series of rings and then click.  I thought that since it was so close to 8 am when the office opens, she would call back.  I talked with Horris Latino in the afternoon and she checked into this matter.  It turns out she was seen by Dr.  Jacqualyn Posey earlier today.   Gardiner Barefoot DPM

## 2015-11-14 NOTE — Progress Notes (Signed)
Subjective: 25 year old female presents the office today for concerns of painful electric shocks to both of her feet going into her toes and she states that all of her toenails are becoming ingrown and she cannot trim them out herself and should to have all of her toenails removed. She also points the back of the left heel and Achilles tendon with she gets some discomfort. Denies any systemic complaints such as fevers, chills, nausea, vomiting. No acute changes since last appointment, and no other complaints at this time.   Objective: AAO x3, NAD DP/PT pulses palpable bilaterally, CRT less than 3 seconds At this time there is no area pinpoint tenderness or pain the vibratory sensation. There is no significant overlying edema, erythema, increase in warmth bilaterally. Mild tenderness in the insertion of the Achilles tendon on the left side. Thompson test negative. There is mild incurvation along all the toenails and there's adductovarus of the toes. There is no surrounding edema, erythema, drainage or pus or any signs of infection. On bilateral hallux nails small spicule some nail from the nails appreciated remove his been a small regrowth. Tenderness to all the nails No edema, erythema, increase in warmth to bilateral lower extremities.  No open lesions or pre-ulcerative lesions.  No pain with calf compression, swelling, warmth, erythema  Assessment: Neuritis, tendinitis; ingrown toenails 10  Plan: -All treatment options discussed with the patient including all alternatives, risks, complications.  -At this time we'll start Medrol Dosepak to help with some neuritis as well as tendinitis. X-ray ordered. Rule out bony exostosis causing neuritis symptoms. The majority of her neuritis symptoms along the course of the superficial peroneal nerve. -I discussed partial nail avulsion versus total nail avulsion. The patient wishes to have all the nails removed. After further this surgical center but she does  not want undergo anesthesia. Unfortunately I saw her in the [facility] office and I do not have Carbocaine or all the equipment to do nail avulsions. She is scheduled for Thursday in [place] all see her then. -Patient encouraged to call the office with any questions, concerns, change in symptoms.   Celesta Gentile, DPM

## 2015-11-15 DIAGNOSIS — D649 Anemia, unspecified: Secondary | ICD-10-CM | POA: Diagnosis not present

## 2015-11-15 DIAGNOSIS — G629 Polyneuropathy, unspecified: Secondary | ICD-10-CM | POA: Diagnosis not present

## 2015-11-15 DIAGNOSIS — M25561 Pain in right knee: Secondary | ICD-10-CM | POA: Diagnosis not present

## 2015-11-15 DIAGNOSIS — R5383 Other fatigue: Secondary | ICD-10-CM | POA: Diagnosis not present

## 2015-11-15 DIAGNOSIS — M25562 Pain in left knee: Secondary | ICD-10-CM | POA: Diagnosis not present

## 2015-11-15 DIAGNOSIS — M2550 Pain in unspecified joint: Secondary | ICD-10-CM | POA: Diagnosis not present

## 2015-11-16 ENCOUNTER — Ambulatory Visit (INDEPENDENT_AMBULATORY_CARE_PROVIDER_SITE_OTHER): Payer: Medicare Other | Admitting: Podiatry

## 2015-11-16 ENCOUNTER — Ambulatory Visit: Payer: Medicaid Other | Admitting: Podiatry

## 2015-11-16 ENCOUNTER — Encounter: Payer: Self-pay | Admitting: Podiatry

## 2015-11-16 DIAGNOSIS — L600 Ingrowing nail: Secondary | ICD-10-CM | POA: Diagnosis not present

## 2015-11-16 MED ORDER — MUPIROCIN CALCIUM 2 % EX CREA: 1.0000 "application " | TOPICAL_CREAM | Freq: Two times a day (BID) | CUTANEOUS | 0 refills | Status: DC

## 2015-11-16 MED ORDER — CEPHALEXIN 500 MG PO CAPS: 500.0000 mg | ORAL_CAPSULE | Freq: Three times a day (TID) | ORAL | 2 refills | Status: DC

## 2015-11-16 NOTE — Patient Instructions (Signed)

## 2015-11-23 DIAGNOSIS — L600 Ingrowing nail: Secondary | ICD-10-CM | POA: Insufficient documentation

## 2015-11-23 NOTE — Progress Notes (Signed)
Subjective: 25 year old female presents the office today request ingrown toenail procedures. Should have all her toenails removed as she says the INGROWN ligament painful. She has been continue on the medications prescribed last appointment and she has not had much in the symptoms that she's been sharp pain that she believes are starting in her ingrown toenails shooting up her leg. She has seen her primary care physician and she has been diagnosed with neuropathy. She is also been referred to orthopedics for multiple joint issues. Denies any systemic complaints such as fevers, chills, nausea, vomiting. No acute changes since last appointment, and no other complaints at this time.   Objective: AAO x3, NAD DP/PT pulses palpable bilaterally, CRT less than 3 seconds This incurvation and tenderness to the left third and fifth toe as well as the right second and fifth toe. There is localized edema around the nail borders of these nails without any erythema or ascending cellulitis. There is tenderness palpation of these nails, to the entire toenail. There are toenails have some mild incurvation how they're not painful at this time. No edema, erythema, increase in warmth to bilateral lower extremities.  No open lesions or pre-ulcerative lesions.  No pain with calf compression, swelling, warmth, erythema  Assessment: Ingrown toenail left third, fifth and right second, fifth  Plan: -All treatment options discussed with the patient including all alternatives, risks, complications.  -This patient is requesting all 10 toenails were removed however I recommended against this. We will start with doing symptomatic toenails. She wishes to go ahead and pursue this. I had a very long discussion with her as well as her power of attorney he will come to her today in regards to the procedure as well as risks and complications of doing this and that this is not a guarantee that the toenail and outcome back working come  back worse. They understand this but given the pain that she is having should go ahead and proceed with this. -At this time, the patient is requesting total nail removal with chemical matricectomy to the symptomatic nails. Risks and complications were discussed with the patient for which they understand and  verbally consent to the procedure. Under sterile conditions a total of 3 mL of a mixture of carbocaine was infiltrated in a digital block fashion. Once anesthetized, the skin was prepped in sterile fashion. A tourniquet was then applied. Next the symptomatic nails were then excised making sure to remove the entire offending nail border. Once the nails were ensured to be removed area was debrided and the underlying skin was intact. There is no purulence identified in the procedure. Next phenol was then applied under standard conditions and copiously irrigated. Silvadene was applied. A dry sterile dressing was applied. After application of the dressing the tourniquet was removed and there is found to be an immediate capillary refill time to the digit. The patient tolerated the procedure well any complications. Post procedure instructions were discussed the patient for which he verbally understood. Follow-up in one week for nail check or sooner if any problems are to arise. Discussed signs/symptoms of infection and directed to call the office immediately should any occur or go directly to the emergency room. In the meantime, encouraged to call the office with any questions, concerns, changes symptoms. -Keflex -Patient encouraged to call the office with any questions, concerns, change in symptoms.   Celesta Gentile, DPM

## 2015-11-27 ENCOUNTER — Encounter: Payer: Self-pay | Admitting: Podiatry

## 2015-11-27 ENCOUNTER — Ambulatory Visit (INDEPENDENT_AMBULATORY_CARE_PROVIDER_SITE_OTHER): Payer: Medicare Other | Admitting: Podiatry

## 2015-11-27 DIAGNOSIS — L600 Ingrowing nail: Secondary | ICD-10-CM

## 2015-11-27 DIAGNOSIS — Z9889 Other specified postprocedural states: Secondary | ICD-10-CM

## 2015-11-27 MED ORDER — SILVER SULFADIAZINE 1 % EX CREA: 1.0000 "application " | TOPICAL_CREAM | Freq: Every day | CUTANEOUS | 0 refills | Status: DC

## 2015-11-27 NOTE — Progress Notes (Signed)
Subjective: Traci Hensley is a 25 y.o.  female returns to office today for follow up evaluation after having left third, fifth and right second, fifth toenail avulsions performed. Patient has been soaking using epsom salts and applying silvadene covered with bandaid daily. She was using the antibiotic ointment that she's had an discoloration and she doesn't allergic reactions that she went using Silvadene without any issues. Patient denies fevers, chills, nausea, vomiting. Denies any calf pain, chest pain, SOB.   Objective:  Vitals: Reviewed  General: Well developed, nourished, in no acute distress, alert and oriented x3   Dermatology: Skin is warm, dry and supple bilateral. Left third, fifth and right second, fifth status post nail avulsion with the nailbeds appearing to be healing well and the small scab is formed. There is no surrounding erythema, edema, drainage/purulence. The remaining nails appear unremarkable at this time. There are no other lesions or other signs of infection present.  Neurovascular status: Intact. No lower extremity swelling; No pain with calf compression bilateral.  Musculoskeletal: Decreased tenderness to palpation of the nail beds. Muscular strength within normal limits bilateral.   Assesement and Plan: S/p partial nail avulsion, doing well.   -Continue soaking in epsom salts twice a day followed by antibiotic ointment and a band-aid. Can leave uncovered at night. Continue this until completely healed.  -If the area has not healed in 2 weeks, call the office for follow-up appointment, or sooner if any problems arise.  -Monitor for any signs/symptoms of infection. Call the office immediately if any occur or go directly to the emergency room. Call with any questions/concerns.  Celesta Gentile, DPM

## 2015-12-11 ENCOUNTER — Telehealth: Payer: Self-pay | Admitting: *Deleted

## 2015-12-11 ENCOUNTER — Ambulatory Visit (INDEPENDENT_AMBULATORY_CARE_PROVIDER_SITE_OTHER): Payer: Medicare Other | Admitting: Podiatry

## 2015-12-11 ENCOUNTER — Encounter: Payer: Self-pay | Admitting: Podiatry

## 2015-12-11 DIAGNOSIS — L600 Ingrowing nail: Secondary | ICD-10-CM | POA: Diagnosis not present

## 2015-12-11 MED ORDER — HYDROCODONE-ACETAMINOPHEN 5-325 MG PO TABS: 1.0000 | ORAL_TABLET | Freq: Four times a day (QID) | ORAL | 0 refills | Status: DC | PRN

## 2015-12-11 NOTE — Progress Notes (Signed)
Subjective: Patient presents today requesting that her other toenails be removed. She is doing well from the other toenail avulsions and they are healing well. She states the other toenails are causing pain and cause "electric shocks" to her toes. She had this with the other toenails but this did resolve after the nails were removed. Denies any redness or drainage. Denies any systemic complaints such as fevers, chills, nausea, vomiting. No acute changes since last appointment, and no other complaints at this time.   She has an upcoming appointment with orthopedics due to multiple joint contractures.   Objective: AAO x3, NAD DP/PT pulses palpable bilaterally, CRT less than 3 seconds Along the sides of the previous nail avulsions along the left third and fifth digit and the right second and fifth digit these areas are healed and there is no edema, erythema, drainage or signs of infection. There is tenderness of the toenails of the left second, fourth and the right third, fourth toenails and there is evidence of incurvation. There is no edema, erythema or drainage or any signs of infection however they are causing pain. She is attended multiple conservative treatments including trimmings and she changes her any relief. No open lesions or pre-ulcerative lesions.  No pain with calf compression, swelling, warmth, erythema  Assessment: Symptomatic ingrown toenail 4  Plan: -All treatment options discussed with the patient including all alternatives, risks, complications.  -Toenail avulsion sides done previously are healing well. -At this time, the patient is requesting partial nail removal with chemical matricectomy to the symptomatic portion of the nail. Risks and complications were discussed with the patient for which they understand and  verbally consent to the procedure. Under sterile conditions a total of 3 mL of a mixture of 2% lidocaine plain and 0.5% Marcaine plain was infiltrated in a hallux  block fashion. Once anesthetized, the skin was prepped in sterile fashion. A tourniquet was then applied. Next the symptomatic nails were then excised making sure to remove the entire offending nail border. Once the nails were ensured to be removed area was debrided and the underlying skin was intact. There is no purulence identified in the procedure. Next phenol was then applied under standard conditions and copiously irrigated. Silvadene was applied. A dry sterile dressing was applied. After application of the dressing the tourniquet was removed and there is found to be an immediate capillary refill time to the digit. The patient tolerated the procedure well any complications. Post procedure instructions were discussed the patient for which he verbally understood. Follow-up in one week for nail check or sooner if any problems are to arise. Discussed signs/symptoms of infection and directed to call the office immediately should any occur or go directly to the emergency room. In the meantime, encouraged to call the office with any questions, concerns, changes symptoms. -Patient encouraged to call the office with any questions, concerns, change in symptoms.

## 2015-12-11 NOTE — Telephone Encounter (Signed)
Person accompanying pt today, states pt is unable to take tablet medications, needs Hydrocodone with acetaminophen in liquid form. Dr. Jacqualyn Posey and I could not find the orders in EPIC, I sent note on the rx form for the order to be prescribed as a liquid.

## 2015-12-11 NOTE — Patient Instructions (Signed)

## 2015-12-25 ENCOUNTER — Encounter: Payer: Self-pay | Admitting: Podiatry

## 2015-12-25 ENCOUNTER — Ambulatory Visit (INDEPENDENT_AMBULATORY_CARE_PROVIDER_SITE_OTHER): Payer: Medicare Other | Admitting: Podiatry

## 2015-12-25 DIAGNOSIS — Z9889 Other specified postprocedural states: Secondary | ICD-10-CM

## 2015-12-25 DIAGNOSIS — L600 Ingrowing nail: Secondary | ICD-10-CM

## 2015-12-25 NOTE — Patient Instructions (Signed)
Continue soaking in epsom salts twice a day followed by sivadene and a band-aid. Can leave uncovered at night. Continue this until completely healed.  If the area has not healed in 2 weeks, call the office for follow-up appointment, or sooner if any problems arise.  Monitor for any signs/symptoms of infection. Call the office immediately if any occur or go directly to the emergency room. Call with any questions/concerns.

## 2015-12-25 NOTE — Progress Notes (Signed)
Subjective: Traci Hensley is a 25 y.o.  Female returns to office today for follow up evaluation after having multiple nail avulsion performed. Patient has been soaking using epsom satls and applying silvadene covered with bandaid daily. Patient denies fevers, chills, nausea, vomiting. Denies any calf pain, chest pain, SOB.   Objective:  Vitals: Reviewed; in god spirits   General: Well developed, nourished, in no acute distress, alert and oriented x3   Dermatology: Skin is warm, dry and supple bilateral. Nail beds appears to be clean, dry, with mild granular tissue and surrounding scab. There is no surrounding erythema, edema, drainage/purulence. The remaining nails appear unremarkable at this time. There are no other lesions or other signs of infection present.  Neurovascular status: Intact. No lower extremity swelling; No pain with calf compression bilateral.  Musculoskeletal: No tenderness to palpation of the nail bed. Muscular strength within normal limits bilateral.   Assesement and Plan: S/p partial nail avulsion, doing well.   -Continue soaking in epsom salts twice a day followed by silvadene and a band-aid. Can leave uncovered at night. Continue this until completely healed.  -If the area has not healed in 2 weeks, call the office for follow-up appointment, or sooner if any problems arise.  -Monitor for any signs/symptoms of infection. Call the office immediately if any occur or go directly to the emergency room. Call with any questions/concerns.  Celesta Gentile, DPM

## 2015-12-28 DIAGNOSIS — J4550 Severe persistent asthma, uncomplicated: Secondary | ICD-10-CM | POA: Diagnosis not present

## 2015-12-28 DIAGNOSIS — J9611 Chronic respiratory failure with hypoxia: Secondary | ICD-10-CM | POA: Diagnosis not present

## 2015-12-28 DIAGNOSIS — I10 Essential (primary) hypertension: Secondary | ICD-10-CM | POA: Diagnosis not present

## 2016-02-09 DIAGNOSIS — M546 Pain in thoracic spine: Secondary | ICD-10-CM | POA: Diagnosis not present

## 2016-02-09 DIAGNOSIS — M9903 Segmental and somatic dysfunction of lumbar region: Secondary | ICD-10-CM | POA: Diagnosis not present

## 2016-02-09 DIAGNOSIS — M9901 Segmental and somatic dysfunction of cervical region: Secondary | ICD-10-CM | POA: Diagnosis not present

## 2016-02-09 DIAGNOSIS — M9905 Segmental and somatic dysfunction of pelvic region: Secondary | ICD-10-CM | POA: Diagnosis not present

## 2016-02-09 DIAGNOSIS — M545 Low back pain: Secondary | ICD-10-CM | POA: Diagnosis not present

## 2016-02-09 DIAGNOSIS — M9902 Segmental and somatic dysfunction of thoracic region: Secondary | ICD-10-CM | POA: Diagnosis not present

## 2016-02-14 DIAGNOSIS — M546 Pain in thoracic spine: Secondary | ICD-10-CM | POA: Diagnosis not present

## 2016-02-14 DIAGNOSIS — M9901 Segmental and somatic dysfunction of cervical region: Secondary | ICD-10-CM | POA: Diagnosis not present

## 2016-02-14 DIAGNOSIS — M9905 Segmental and somatic dysfunction of pelvic region: Secondary | ICD-10-CM | POA: Diagnosis not present

## 2016-02-14 DIAGNOSIS — M545 Low back pain: Secondary | ICD-10-CM | POA: Diagnosis not present

## 2016-02-14 DIAGNOSIS — M9902 Segmental and somatic dysfunction of thoracic region: Secondary | ICD-10-CM | POA: Diagnosis not present

## 2016-02-14 DIAGNOSIS — M9903 Segmental and somatic dysfunction of lumbar region: Secondary | ICD-10-CM | POA: Diagnosis not present

## 2016-02-19 DIAGNOSIS — M9903 Segmental and somatic dysfunction of lumbar region: Secondary | ICD-10-CM | POA: Diagnosis not present

## 2016-02-19 DIAGNOSIS — M9905 Segmental and somatic dysfunction of pelvic region: Secondary | ICD-10-CM | POA: Diagnosis not present

## 2016-02-19 DIAGNOSIS — M9901 Segmental and somatic dysfunction of cervical region: Secondary | ICD-10-CM | POA: Diagnosis not present

## 2016-02-19 DIAGNOSIS — M9902 Segmental and somatic dysfunction of thoracic region: Secondary | ICD-10-CM | POA: Diagnosis not present

## 2016-02-19 DIAGNOSIS — M545 Low back pain: Secondary | ICD-10-CM | POA: Diagnosis not present

## 2016-02-19 DIAGNOSIS — M546 Pain in thoracic spine: Secondary | ICD-10-CM | POA: Diagnosis not present

## 2016-02-21 ENCOUNTER — Other Ambulatory Visit: Payer: Self-pay | Admitting: Sports Medicine

## 2016-02-21 DIAGNOSIS — M545 Low back pain: Secondary | ICD-10-CM

## 2016-02-21 DIAGNOSIS — M4120 Other idiopathic scoliosis, site unspecified: Secondary | ICD-10-CM | POA: Diagnosis not present

## 2016-02-26 DIAGNOSIS — M546 Pain in thoracic spine: Secondary | ICD-10-CM | POA: Diagnosis not present

## 2016-02-26 DIAGNOSIS — M9905 Segmental and somatic dysfunction of pelvic region: Secondary | ICD-10-CM | POA: Diagnosis not present

## 2016-02-26 DIAGNOSIS — M9902 Segmental and somatic dysfunction of thoracic region: Secondary | ICD-10-CM | POA: Diagnosis not present

## 2016-02-26 DIAGNOSIS — M9903 Segmental and somatic dysfunction of lumbar region: Secondary | ICD-10-CM | POA: Diagnosis not present

## 2016-02-26 DIAGNOSIS — M545 Low back pain: Secondary | ICD-10-CM | POA: Diagnosis not present

## 2016-02-26 DIAGNOSIS — M9901 Segmental and somatic dysfunction of cervical region: Secondary | ICD-10-CM | POA: Diagnosis not present

## 2016-02-29 ENCOUNTER — Other Ambulatory Visit: Payer: Medicare Other

## 2016-03-06 DIAGNOSIS — M9905 Segmental and somatic dysfunction of pelvic region: Secondary | ICD-10-CM | POA: Diagnosis not present

## 2016-03-06 DIAGNOSIS — M546 Pain in thoracic spine: Secondary | ICD-10-CM | POA: Diagnosis not present

## 2016-03-06 DIAGNOSIS — M9903 Segmental and somatic dysfunction of lumbar region: Secondary | ICD-10-CM | POA: Diagnosis not present

## 2016-03-06 DIAGNOSIS — M9901 Segmental and somatic dysfunction of cervical region: Secondary | ICD-10-CM | POA: Diagnosis not present

## 2016-03-06 DIAGNOSIS — M545 Low back pain: Secondary | ICD-10-CM | POA: Diagnosis not present

## 2016-03-06 DIAGNOSIS — M9902 Segmental and somatic dysfunction of thoracic region: Secondary | ICD-10-CM | POA: Diagnosis not present

## 2016-03-20 DIAGNOSIS — M9901 Segmental and somatic dysfunction of cervical region: Secondary | ICD-10-CM | POA: Diagnosis not present

## 2016-03-20 DIAGNOSIS — M9903 Segmental and somatic dysfunction of lumbar region: Secondary | ICD-10-CM | POA: Diagnosis not present

## 2016-03-20 DIAGNOSIS — M9902 Segmental and somatic dysfunction of thoracic region: Secondary | ICD-10-CM | POA: Diagnosis not present

## 2016-03-20 DIAGNOSIS — M545 Low back pain: Secondary | ICD-10-CM | POA: Diagnosis not present

## 2016-03-20 DIAGNOSIS — M546 Pain in thoracic spine: Secondary | ICD-10-CM | POA: Diagnosis not present

## 2016-03-20 DIAGNOSIS — M9905 Segmental and somatic dysfunction of pelvic region: Secondary | ICD-10-CM | POA: Diagnosis not present

## 2016-05-07 DIAGNOSIS — E782 Mixed hyperlipidemia: Secondary | ICD-10-CM | POA: Diagnosis not present

## 2016-05-07 DIAGNOSIS — J329 Chronic sinusitis, unspecified: Secondary | ICD-10-CM | POA: Diagnosis not present

## 2016-05-07 DIAGNOSIS — R5383 Other fatigue: Secondary | ICD-10-CM | POA: Diagnosis not present

## 2016-05-07 DIAGNOSIS — J029 Acute pharyngitis, unspecified: Secondary | ICD-10-CM | POA: Diagnosis not present

## 2016-05-15 ENCOUNTER — Other Ambulatory Visit: Payer: Self-pay | Admitting: Sports Medicine

## 2016-05-15 DIAGNOSIS — M25562 Pain in left knee: Secondary | ICD-10-CM | POA: Diagnosis not present

## 2016-05-15 DIAGNOSIS — M545 Low back pain, unspecified: Secondary | ICD-10-CM

## 2016-05-15 DIAGNOSIS — M25561 Pain in right knee: Secondary | ICD-10-CM | POA: Diagnosis not present

## 2016-05-15 DIAGNOSIS — M4120 Other idiopathic scoliosis, site unspecified: Secondary | ICD-10-CM | POA: Diagnosis not present

## 2016-05-22 ENCOUNTER — Telehealth: Payer: Self-pay | Admitting: Cardiology

## 2016-05-22 NOTE — Telephone Encounter (Signed)
New message     Pt c/o medication issue:  1. Name of Medication: propranolol (INDERAL) 20 MG tablet  2. How are you currently taking this medication (dosage and times per day)? 20 MG  3. Are you having a reaction (difficulty breathing--STAT)? Blood pressure still too low 4. What is your medication issue? Blood pressure too low

## 2016-05-22 NOTE — Telephone Encounter (Signed)
Spoke with Jayme Cloud - pt's caregiver.  She is reporting pt's HR is 100 to 180 bpm with resting to walking and her BP is continually low.  Pt is not able to take Propranolol to decrease HR d/t hypotension.  Advised to increase fluid and sodium intake and she will need to wear knee high compression hose.  Caregiver very adamant pt is not dehydrated and eats enough salt.  Would like to know what medication she can take to lower her HR without lowering her blood pressure.  Advised I will review with Dr Marlou Porch and call back with any recommendations.

## 2016-05-23 NOTE — Telephone Encounter (Signed)
Agree with current conversation. So liberalization, fluid liberalization. She does have a syndrome that is likely on the spectrum of POTS. Unfortunately all beta blockers or calcium channel blockers which can slow heart rate may have an effect on blood pressure. I am unsure if Ivabradine would be appropriate use here. Has appt in the near future.   Candee Furbish, MD

## 2016-05-24 ENCOUNTER — Ambulatory Visit
Admission: RE | Admit: 2016-05-24 | Discharge: 2016-05-24 | Disposition: A | Discharge status: Home / Self Care | Payer: Medicare Other | Source: Ambulatory Visit | Attending: Sports Medicine | Admitting: Sports Medicine

## 2016-05-24 DIAGNOSIS — M545 Low back pain, unspecified: Secondary | ICD-10-CM

## 2016-05-28 ENCOUNTER — Encounter: Payer: Self-pay | Admitting: Cardiology

## 2016-05-30 DIAGNOSIS — M545 Low back pain: Secondary | ICD-10-CM | POA: Diagnosis not present

## 2016-05-30 DIAGNOSIS — M546 Pain in thoracic spine: Secondary | ICD-10-CM | POA: Diagnosis not present

## 2016-05-30 DIAGNOSIS — M9901 Segmental and somatic dysfunction of cervical region: Secondary | ICD-10-CM | POA: Diagnosis not present

## 2016-05-30 DIAGNOSIS — M9905 Segmental and somatic dysfunction of pelvic region: Secondary | ICD-10-CM | POA: Diagnosis not present

## 2016-05-30 DIAGNOSIS — M9903 Segmental and somatic dysfunction of lumbar region: Secondary | ICD-10-CM | POA: Diagnosis not present

## 2016-05-30 DIAGNOSIS — M9902 Segmental and somatic dysfunction of thoracic region: Secondary | ICD-10-CM | POA: Diagnosis not present

## 2016-06-05 NOTE — Telephone Encounter (Signed)
Pt has appt in the AM with Dr Marlou Porch.

## 2016-06-06 ENCOUNTER — Ambulatory Visit (INDEPENDENT_AMBULATORY_CARE_PROVIDER_SITE_OTHER): Payer: Medicare Other | Admitting: Cardiology

## 2016-06-06 ENCOUNTER — Encounter: Payer: Self-pay | Admitting: Cardiology

## 2016-06-06 VITALS — BP 110/60 | HR 93 | Ht 61.0 in | Wt 118.0 lb

## 2016-06-06 DIAGNOSIS — R000 Tachycardia, unspecified: Secondary | ICD-10-CM | POA: Diagnosis not present

## 2016-06-06 DIAGNOSIS — R079 Chest pain, unspecified: Secondary | ICD-10-CM | POA: Diagnosis not present

## 2016-06-06 DIAGNOSIS — F419 Anxiety disorder, unspecified: Secondary | ICD-10-CM | POA: Diagnosis not present

## 2016-06-06 DIAGNOSIS — J4550 Severe persistent asthma, uncomplicated: Secondary | ICD-10-CM | POA: Diagnosis not present

## 2016-06-06 DIAGNOSIS — R002 Palpitations: Secondary | ICD-10-CM | POA: Diagnosis not present

## 2016-06-06 DIAGNOSIS — I10 Essential (primary) hypertension: Secondary | ICD-10-CM | POA: Diagnosis not present

## 2016-06-06 DIAGNOSIS — J309 Allergic rhinitis, unspecified: Secondary | ICD-10-CM | POA: Diagnosis not present

## 2016-06-06 MED ORDER — PROPRANOLOL HCL 20 MG PO TABS: 20.0000 mg | ORAL_TABLET | Freq: Two times a day (BID) | ORAL | 99 refills | Status: DC | PRN

## 2016-06-06 NOTE — Progress Notes (Signed)
Cardiology Office Note    Date:  06/06/2016   ID:  Traci Hensley, DOB Feb 14, 1991, MRN [id number]  PCP:  Alonza Bogus, MD  Cardiologist:   Candee Furbish, MD     History of Present Illness:  Traci Hensley is a 26 y.o. female with PTSD, anxiety, multiple ER visits for atypical CP with prior unremarkable workup.   Imdur has been used. Propanolol liquid.  She is no longer taking these medications. POTS like low BP. Salt, Gatorade.   She is having some trouble with back pain. Orthopedist has seen.  Past Medical History:  Diagnosis Date  . Agoraphobia   . Anxiety   . Bipolar 1 disorder ([place])   . Chronic abdominal pain   . Chronic chest pain   . Depression   . PTSD (post-traumatic stress disorder)   . Scoliosis     Past Surgical History:  Procedure Laterality Date  . MOUTH SURGERY      Current Medications: Outpatient Medications Prior to Visit  Medication Sig Dispense Refill  . ADVAIR DISKUS 500-50 MCG/DOSE AEPB Inhale 1 puff into the lungs 2 times a day  12  . albuterol (PROVENTIL HFA;VENTOLIN HFA) 108 (90 BASE) MCG/ACT inhaler Inhale 1-2 puffs into the lungs every 4 (four) hours as needed for wheezing or shortness of breath.    . AMETHIA 0.15-0.03 &0.01 MG tablet Take 1 tablet by mouth daily.   0  . EPINEPHrine 0.3 mg/0.3 mL IJ SOAJ injection Inject 0.3 mg into the muscle once. As needed for allergic reaction    . ipratropium-albuterol (DUONEB) 0.5-2.5 (3) MG/3ML SOLN Inhale into lungs 4 times a day as needed for wheezing  12  . nitroGLYCERIN (NITROSTAT) 0.4 MG SL tablet Place 1 tablet (0.4 mg total) under the tongue every 5 (five) minutes as needed for chest pain. 25 tablet 12  . carvedilol (COREG) 3.125 MG tablet Take 1 tablet (3.125 mg total) by mouth 2 (two) times daily. (Patient not taking: Reported on 06/06/2016) 60 tablet 11  . cetirizine (ZYRTEC) 10 MG tablet Take one tablet by mouth ONCE DAILY ALTERNATE WITH FEXOFENADINE every other day  4  . isosorbide  mononitrate (IMDUR) 60 MG 24 hr tablet Take 1 tablet (60 mg total) by mouth daily. (Patient not taking: Reported on 06/06/2016) 30 tablet 11  . metaxalone (SKELAXIN) 800 MG tablet Take 800 mg by mouth 3 (three) times daily.    . montelukast (SINGULAIR) 10 MG tablet Take 1 tablet by mouth every day  12  . propranolol (INDERAL) 20 MG tablet Take 1 tablet (20 mg total) by mouth 3 (three) times daily. (Patient not taking: Reported on 06/06/2016) 90 tablet 11  . RESTASIS 0.05 % ophthalmic emulsion Place 2 drops into both eyes 2 (two) times daily.   3  . silver sulfADIAZINE (SILVADENE) 1 % cream Apply 1 application topically daily. (Patient not taking: Reported on 06/06/2016) 50 g 0   No facility-administered medications prior to visit.      Allergies:   Adhesive [tape]; Blue dyes (parenteral); Contrast media [iodinated diagnostic agents]; Corn-containing products; Green dyes; Nickel; Onion; Other; Cortisporin [bacitra-neomycin-polymyxin-hc]; Yellow jacket venom; and Petroleum distillate   Social History   Social History  . Marital status: Single    Spouse name: N/A  . Number of children: N/A  . Years of education: N/A   Social History Main Topics  . Smoking status: Former Smoker    Packs/day: 0.01    Years: 2.00    Types: Cigarettes  Quit date: 02/14/2014  . Smokeless tobacco: Never Used     Comment: rare, social smoker  . Alcohol use 0.0 oz/week     Comment: occ  . Drug use: No  . Sexual activity: Not Asked   Other Topics Concern  . None   Social History Narrative  . None     Family History:  The patient's family history includes Arrhythmia in her mother.   ROS:   Please see the history of present illness.    ROS All other systems reviewed and are negative.   PHYSICAL EXAM:   VS:  BP 110/60   Pulse 93   Ht 5\' 1"  (1.549 m)   Wt 118 lb (53.5 kg)   BMI 22.30 kg/m    GEN: Well nourished, well developed, in no acute distress  HEENT: normal  Neck: no JVD, carotid bruits,  or masses Cardiac: RRR; no murmurs, rubs, or gallops,no edema  Respiratory:  clear to auscultation bilaterally, normal work of breathing GI: soft, nontender, nondistended, + BS MS: no deformity or atrophy  Skin: warm and dry, no rash Neuro:  Alert and Oriented x 3, Strength and sensation are intact Psych: anxious mood, full affect  Wt Readings from Last 3 Encounters:  06/06/16 118 lb (53.5 kg)  05/24/15 117 lb (53.1 kg)  06/05/14 111 lb (50.3 kg)      Studies/Labs Reviewed:   EKG:  Prior EKG from ERs noted, sinus tachycardia.  Recent Labs: No results found for requested labs within last 8760 hours.   Lipid Panel No results found for: CHOL, TRIG, HDL, CHOLHDL, VLDL, LDLCALC, LDLDIRECT  Additional studies/ records that were reviewed today include:   Echocardiogram 04/29/13 - Left ventricle: The cavity size was normal. Wall thickness was normal. Systolic function was normal. The estimated ejection fraction was in the range of 60% to 65%. Wall motion was normal; there were no regional wall motion abnormalities. Left ventricular diastolic function parameters were normal. - Mitral valve: Mildly thickened leaflets . Trivial regurgitation. - Left atrium: The atrium was normal in size. - Inferior vena cava: The vessel was normal in size; the respirophasic diameter changes were in the normal range (= 50%); findings are consistent with normal central venous pressure. - Pericardium, extracardiac: There was no pericardial effusion.    ASSESSMENT:    1. Chest pain, unspecified type   2. Palpitations   3. Anxiety   4. Sinus tachycardia      PLAN:  In order of problems listed above:  Atypical chest pain  - No longer on Imdur or carvedilol.  - propanolol as well when necessary  - She did not really complain of chest discomfort.  Palpitations  - propanolol When necessary  - EKGs in the emergency department have shown sinus tachycardia.  - Prior  event monitor caused bleeding.  Anxiety  - per primary team. PTSD      Medication Adjustments/Labs and Tests Ordered: Current medicines are reviewed at length with the patient today.  Concerns regarding medicines are outlined above.  Medication changes, Labs and Tests ordered today are listed in the Patient Instructions below. Patient Instructions  Medication Instructions:  Please use Propranolol 20 mg up to twice a day as needed. Continue all other medications as listed.  Follow-Up: Follow up in 1 year with Dr. Marlou Porch.  You will receive a letter in the mail 2 months before you are due.  Please call us when you receive this letter to schedule your follow up appointment.  If  you need a refill on your cardiac medications before your next appointment, please call your pharmacy.  Thank you for choosing [facility]!!    Please continue to consume adequate amounts of Sodium and water.    Signed, Candee Furbish, MD  06/06/2016 9:04 AM    [facility] [address] [place], [place], [place]  [address] Phone: [phone]; Fax: [phone]

## 2016-06-06 NOTE — Patient Instructions (Signed)
Medication Instructions:  Please use Propranolol 20 mg up to twice a day as needed. Continue all other medications as listed.  Follow-Up: Follow up in 1 year with Dr. Marlou Porch.  You will receive a letter in the mail 2 months before you are due.  Please call us when you receive this letter to schedule your follow up appointment.  If you need a refill on your cardiac medications before your next appointment, please call your pharmacy.  Thank you for choosing [facility]!!    Please continue to consume adequate amounts of Sodium and water.

## 2016-06-10 ENCOUNTER — Telehealth: Payer: Self-pay | Admitting: Family Medicine

## 2016-06-10 NOTE — Telephone Encounter (Signed)
Please call her POA.  I have reviewed her recent notes.  I do not feel I will be able to provide her care unless she agrees to psychiatric treatment.  I would cancel the consult.

## 2016-06-10 NOTE — Telephone Encounter (Signed)
Lurlean Nanny is POA for [employer] and she is asking if she can come in and meet with Dr. Meda Coffee ahead of time to make sure this will be a good fit before the appointment on 06/18/16, Please advise?

## 2016-06-10 NOTE — Telephone Encounter (Signed)
Called and spoke to [facility], states they are not willing to go to psych, asking for appt to be cancelled.

## 2016-06-18 ENCOUNTER — Ambulatory Visit: Payer: Medicare Other | Admitting: Family Medicine

## 2016-06-20 DIAGNOSIS — M545 Low back pain: Secondary | ICD-10-CM | POA: Diagnosis not present

## 2016-06-20 DIAGNOSIS — M5416 Radiculopathy, lumbar region: Secondary | ICD-10-CM | POA: Diagnosis not present

## 2016-06-24 DIAGNOSIS — N946 Dysmenorrhea, unspecified: Secondary | ICD-10-CM | POA: Diagnosis not present

## 2016-06-24 DIAGNOSIS — M792 Neuralgia and neuritis, unspecified: Secondary | ICD-10-CM | POA: Diagnosis not present

## 2016-06-24 DIAGNOSIS — J45909 Unspecified asthma, uncomplicated: Secondary | ICD-10-CM | POA: Diagnosis not present

## 2016-06-24 DIAGNOSIS — F4310 Post-traumatic stress disorder, unspecified: Secondary | ICD-10-CM | POA: Diagnosis not present

## 2016-07-23 ENCOUNTER — Telehealth: Payer: Self-pay | Admitting: Cardiology

## 2016-07-23 NOTE — Telephone Encounter (Signed)
Patent's mother called stating that she is having a flare of her allergies and has tried several allergy meds and continues to have problems.  Her mother wants a prescription called in for steroids as she has asthma as well.  I told her that she would need to call the patient's PCP or her pulmonologist and she stated that she does not have a PCP and her pulmonologist doesn't have anyone covering at night.  I instructed her that if she could not wait until morning then she needed to take her to an Urgent Care or the ER.  Her mother was very beligerant on the phone and screamed at me.  I informed her that I was not a Pulmonologist and Dr. Marlou Porch only follows her for her CP.  She will need to be evaluated in the ER or Urgent Care.  Her mother was very angry and hung up.  This was discussed with Dr. Marlou Porch after the mother hung up and he agreed that the recommendations were appropriate for care at the ER.

## 2016-09-16 ENCOUNTER — Encounter (HOSPITAL_COMMUNITY): Payer: Self-pay | Admitting: Emergency Medicine

## 2016-09-16 ENCOUNTER — Emergency Department (HOSPITAL_COMMUNITY)
Admission: EM | Admit: 2016-09-16 | Discharge: 2016-09-16 | Disposition: A | Discharge status: Home / Self Care | Payer: Medicare Other | Attending: Emergency Medicine | Admitting: Emergency Medicine

## 2016-09-16 DIAGNOSIS — L551 Sunburn of second degree: Secondary | ICD-10-CM | POA: Insufficient documentation

## 2016-09-16 DIAGNOSIS — L559 Sunburn, unspecified: Secondary | ICD-10-CM | POA: Diagnosis present

## 2016-09-16 DIAGNOSIS — Z79899 Other long term (current) drug therapy: Secondary | ICD-10-CM | POA: Diagnosis not present

## 2016-09-16 DIAGNOSIS — T7840XA Allergy, unspecified, initial encounter: Secondary | ICD-10-CM | POA: Diagnosis not present

## 2016-09-16 DIAGNOSIS — L568 Other specified acute skin changes due to ultraviolet radiation: Secondary | ICD-10-CM | POA: Insufficient documentation

## 2016-09-16 DIAGNOSIS — Z87891 Personal history of nicotine dependence: Secondary | ICD-10-CM | POA: Insufficient documentation

## 2016-09-16 DIAGNOSIS — X32XXXA Exposure to sunlight, initial encounter: Secondary | ICD-10-CM | POA: Diagnosis not present

## 2016-09-16 MED ORDER — SILVER SULFADIAZINE 1 % EX CREA: 1.0000 "application " | TOPICAL_CREAM | Freq: Every day | CUTANEOUS | 1 refills | Status: DC

## 2016-09-16 MED ORDER — PREDNISONE 20 MG PO TABS
80.0000 mg | ORAL_TABLET | Freq: Once | ORAL | Status: AC
  Administered 2016-09-16: 17:00:00 80 mg via ORAL
  Filled 2016-09-16: qty 1

## 2016-09-16 MED ORDER — PREDNISONE 20 MG PO TABS: 20.0000 mg | ORAL_TABLET | Freq: Every day | ORAL | 0 refills | Status: DC

## 2016-09-16 MED ORDER — DIPHENHYDRAMINE HCL 25 MG PO CAPS
50.0000 mg | ORAL_CAPSULE | Freq: Once | ORAL | Status: AC
  Administered 2016-09-16: 50 mg via ORAL
  Filled 2016-09-16: qty 2
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
## 2016-09-16 NOTE — ED Provider Notes (Signed)
[facility] DEPT Provider Note   CSN: [id number] Arrival date & time: 09/16/16  1621     History   Chief Complaint Chief Complaint  Patient presents with  . Sunburn    HPI Traci Hensley is a 26 y.o. female.  Patient presents with a severe sunburn allergy on right chest, neck, right forearm the past 24 hours after sun exposure for approximately 20 minutes. Patient has known multiple allergy phenomenon including sun. No trouble swallowing, respiratory distress, fever, sweats, chills. She has tried over-the-counter medicines with minimal relief.      Past Medical History:  Diagnosis Date  . Agoraphobia   . Anxiety   . Bipolar 1 disorder ([place])   . Chronic abdominal pain   . Chronic chest pain   . Depression   . PTSD (post-traumatic stress disorder)   . Scoliosis     Patient Active Problem List   Diagnosis Date Noted  . Ingrown toenail 11/23/2015  . Dyspepsia 08/31/2014  . Abdominal pain 06/29/2014  . Achilles tendinitis of both lower extremities 01/23/2014  . Palpitations 04/19/2013  . Chest pain 04/19/2013  . Sinus tachycardia 04/19/2013  . Anxiety 04/19/2013    Past Surgical History:  Procedure Laterality Date  . MOUTH SURGERY      OB History    No data available       Home Medications    Prior to Admission medications   Medication Sig Start Date End Date Taking? Authorizing Provider  ADVAIR DISKUS 500-50 MCG/DOSE AEPB Inhale 1 puff into the lungs 2 times a day 07/25/14   [provider]  albuterol (PROVENTIL HFA;VENTOLIN HFA) 108 (90 BASE) MCG/ACT inhaler Inhale 1-2 puffs into the lungs every 4 (four) hours as needed for wheezing or shortness of breath.    [provider]  AMETHIA 0.15-0.03 &0.01 MG tablet Take 1 tablet by mouth daily.  06/14/14   [provider]  EPINEPHrine 0.3 mg/0.3 mL IJ SOAJ injection Inject 0.3 mg into the muscle once. As needed for allergic reaction    [provider]  gabapentin  (NEURONTIN) 300 MG capsule Take 300 mg by mouth 3 (three) times daily. 06/03/16   [provider]  ipratropium-albuterol (DUONEB) 0.5-2.5 (3) MG/3ML SOLN Inhale into lungs 4 times a day as needed for wheezing 07/25/14   [provider]  MONONESSA 0.25-35 MG-MCG tablet Take 1 tablet by mouth daily. 04/22/16   [provider]  nitroGLYCERIN (NITROSTAT) 0.4 MG SL tablet Place 1 tablet (0.4 mg total) under the tongue every 5 (five) minutes as needed for chest pain. 06/17/14   Jerline Pain, MD  predniSONE (DELTASONE) 20 MG tablet Take 1 tablet (20 mg total) by mouth daily with breakfast. 4 tablets for 2 days, 3 tablets for 2 days, 2 tablets for 2 days, one tablet for 2 days, one half tablet for 2 days 09/16/16   Nat Christen, MD  propranolol (INDERAL) 20 MG tablet Take 1 tablet (20 mg total) by mouth 2 (two) times daily as needed. 06/06/16   Jerline Pain, MD  silver sulfADIAZINE (SILVADENE) 1 % cream Apply 1 application topically daily. Apply sparingly. 09/16/16   Nat Christen, MD    Family History Family History  Problem Relation Age of Onset  . Arrhythmia Mother   . Colon cancer Neg Hx     Social History Social History  Substance Use Topics  . Smoking status: Former Smoker    Packs/day: 0.01    Years: 2.00  Types: Cigarettes    Quit date: 02/14/2014  . Smokeless tobacco: Never Used     Comment: rare, social smoker  . Alcohol use 0.0 oz/week     Comment: occ     Allergies   Adhesive [tape]; Blue dyes (parenteral); Contrast media [iodinated diagnostic agents]; Corn-containing products; Green dyes; Nickel; Onion; Other; Cortisporin [bacitra-neomycin-polymyxin-hc]; Yellow jacket venom; and Petroleum distillate   Review of Systems Review of Systems  All other systems reviewed and are negative.    Physical Exam Updated Vital Signs BP 138/88 (BP Location: Right Arm)   Pulse (!) 124   Temp 98.3 F (36.8 C) (Oral)   Resp 20   Ht 5\' 1"  (1.549 m)   Wt 52.2 kg  (115 lb)   SpO2 100%   BMI 21.73 kg/m   Physical Exam  Constitutional: She is oriented to person, place, and time. She appears well-developed and well-nourished.  HENT:  Head: Normocephalic and atraumatic.  Eyes: Conjunctivae are normal.  Neck: Neck supple.  Cardiovascular: Normal rate and regular rhythm.   Pulmonary/Chest: Effort normal and breath sounds normal.  Abdominal: Soft. Bowel sounds are normal.  Musculoskeletal: Normal range of motion.  Neurological: She is alert and oriented to person, place, and time.  Skin:  First and min second-degree burn on neck, right chest, right forearm.  Psychiatric: She has a normal mood and affect. Her behavior is normal.  Nursing note and vitals reviewed.    ED Treatments / Results  Labs (all labs ordered are listed, but only abnormal results are displayed) Labs Reviewed - No data to display  EKG  EKG Interpretation None       Radiology No results found.  Procedures Procedures (including critical care time)  Medications Ordered in ED Medications  predniSONE (DELTASONE) tablet 80 mg (80 mg Oral Given 09/16/16 1709)  diphenhydrAMINE (BENADRYL) capsule 50 mg (50 mg Oral Given 09/16/16 1708)     Initial Impression / Assessment and Plan / ED Course  I have reviewed the triage vital signs and the nursing notes.  Pertinent labs & imaging results that were available during my care of the patient were reviewed by me and considered in my medical decision making (see chart for details).     Mother and patient reports multiple allergies. Will Rx prednisone and Silvadene ointment for burn.  Final Clinical Impressions(s) / ED Diagnoses   Final diagnoses:  Allergic reaction, initial encounter    New Prescriptions New Prescriptions   PREDNISONE (DELTASONE) 20 MG TABLET    Take 1 tablet (20 mg total) by mouth daily with breakfast. 4 tablets for 2 days, 3 tablets for 2 days, 2 tablets for 2 days, one tablet for 2 days, one half  tablet for 2 days   SILVER SULFADIAZINE (SILVADENE) 1 % CREAM    Apply 1 application topically daily. Apply sparingly.     Nat Christen, MD 09/16/16 [phone]

## 2016-09-16 NOTE — Discharge Instructions (Signed)
Prescription for prednisone and Silvadene. May also take Benadryl, Claritin, Dimetapp. Return if worse.

## 2016-11-30 ENCOUNTER — Encounter (HOSPITAL_COMMUNITY): Payer: Self-pay

## 2016-11-30 ENCOUNTER — Emergency Department (HOSPITAL_COMMUNITY)
Admission: EM | Admit: 2016-11-30 | Discharge: 2016-11-30 | Disposition: A | Discharge status: Home / Self Care | Payer: Medicare Other | Attending: Emergency Medicine | Admitting: Emergency Medicine

## 2016-11-30 DIAGNOSIS — Z5321 Procedure and treatment not carried out due to patient leaving prior to being seen by health care provider: Secondary | ICD-10-CM | POA: Diagnosis not present

## 2016-11-30 DIAGNOSIS — R000 Tachycardia, unspecified: Secondary | ICD-10-CM | POA: Diagnosis present

## 2016-11-30 NOTE — ED Triage Notes (Signed)
Pt reported that she started feeling her heart was racing, states she had a rate of 160.

## 2016-11-30 NOTE — ED Notes (Signed)
Patient mother out to desk stating that the patient has decided she wants to leave, that her "anxiety is down to a level it needs to be and her heart rate is fine". Encouraged her to return if/when needed and to follow up with her doctor.

## 2016-12-03 ENCOUNTER — Encounter (HOSPITAL_COMMUNITY): Payer: Self-pay | Admitting: *Deleted

## 2016-12-03 ENCOUNTER — Telehealth: Payer: Self-pay | Admitting: Cardiology

## 2016-12-03 ENCOUNTER — Emergency Department (HOSPITAL_COMMUNITY)
Admission: EM | Admit: 2016-12-03 | Discharge: 2016-12-03 | Disposition: A | Discharge status: Home / Self Care | Payer: Medicare Other | Attending: Emergency Medicine | Admitting: Emergency Medicine

## 2016-12-03 DIAGNOSIS — R000 Tachycardia, unspecified: Secondary | ICD-10-CM | POA: Insufficient documentation

## 2016-12-03 DIAGNOSIS — Z79899 Other long term (current) drug therapy: Secondary | ICD-10-CM | POA: Diagnosis not present

## 2016-12-03 DIAGNOSIS — Z91041 Radiographic dye allergy status: Secondary | ICD-10-CM | POA: Insufficient documentation

## 2016-12-03 DIAGNOSIS — Z87891 Personal history of nicotine dependence: Secondary | ICD-10-CM | POA: Diagnosis not present

## 2016-12-03 LAB — I-STAT BETA HCG BLOOD, ED (MC, WL, AP ONLY): I-stat hCG, quantitative: <5 m[IU]/mL (ref <5)

## 2016-12-03 LAB — CBC
HCT: 39.7 % (ref 36.0–46.0)
Hemoglobin: 13.9 g/dL (ref 12.0–15.0)
MCH: 30.8 pg (ref 26.0–34.0)
MCHC: 35 g/dL (ref 30.0–36.0)
MCV: 88 fL (ref 78.0–100.0)
Platelets: 224 10*3/uL (ref 150–400)
RBC: 4.51 MIL/uL (ref 3.87–5.11)
RDW: 11.8 % (ref 11.5–15.5)
WBC: 8.7 10*3/uL (ref 4.0–10.5)

## 2016-12-03 LAB — COMPREHENSIVE METABOLIC PANEL
ALT: 48 U/L (ref 14–54)
AST: 36 U/L (ref 15–41)
Albumin: 4.7 g/dL (ref 3.5–5.0)
Alkaline Phosphatase: 51 U/L (ref 38–126)
Anion gap: 9 (ref 5–15)
BUN: 14 mg/dL (ref 6–20)
CO2: 26 mmol/L (ref 22–32)
Calcium: 10 mg/dL (ref 8.9–10.3)
Chloride: 103 mmol/L (ref 101–111)
Creatinine, Ser: 0.91 mg/dL (ref 0.44–1.00)
GFR calc Af Amer: >60 mL/min (ref >60)
GFR calc non Af Amer: >60 mL/min (ref >60)
Glucose, Bld: 107 mg/dL — ABNORMAL HIGH (ref 65–99)
Potassium: 3.4 mmol/L — ABNORMAL LOW (ref 3.5–5.1)
Sodium: 138 mmol/L (ref 135–145)
Total Bilirubin: 1 mg/dL (ref 0.3–1.2)
Total Protein: 8 g/dL (ref 6.5–8.1)

## 2016-12-03 LAB — MAGNESIUM: Magnesium: 1.8 mg/dL (ref 1.7–2.4)

## 2016-12-03 LAB — LIPASE, BLOOD: Lipase: 23 U/L (ref 11–51)

## 2016-12-03 MED ORDER — PROPRANOLOL HCL 10 MG PO TABS
10.0000 mg | ORAL_TABLET | Freq: Once | ORAL | Status: AC
  Administered 2016-12-03: 5 mg via ORAL
  Filled 2016-12-03: qty 1

## 2016-12-03 NOTE — ED Provider Notes (Signed)
[facility] DEPT Provider Note   CSN: [id number] Arrival date & time: 12/03/16  1410     History   Chief Complaint Chief Complaint  Patient presents with  . Tachycardia    HPI Traci Hensley is a 26 y.o. female.  HPI The patient has a history of recurrent tachycardia.  Patient states usually she takes propranolol but her blood pressure was low today so she did not take it.  Intermittently she has episodes where her heart will race. At home today was up into the 160s. The patient's had recurrent episodes of tachycardia as well as chest pain. She has seen a cardiologist but they've never been able to diagnose what is wrong  with her. She wants to get another opinion. Patient also has upper abdominal pain. This is a recurrent issue for her. Patient states she had an ultrasound in the past and on one of the images they said there was something wrong with the blood flow in her abdomen however the results of that study were lost. Patient states both of those studies were done at [facility] on the same day but there is only one study result that shows up.  She denies any fever.  No vomiting or diarrhea.   Past Medical History:  Diagnosis Date  . Agoraphobia   . Anxiety   . Bipolar 1 disorder ([place])   . Chronic abdominal pain   . Chronic chest pain   . Depression   . PTSD (post-traumatic stress disorder)   . Scoliosis     Patient Active Problem List   Diagnosis Date Noted  . Ingrown toenail 11/23/2015  . Dyspepsia 08/31/2014  . Abdominal pain 06/29/2014  . Achilles tendinitis of both lower extremities 01/23/2014  . Palpitations 04/19/2013  . Chest pain 04/19/2013  . Sinus tachycardia 04/19/2013  . Anxiety 04/19/2013    Past Surgical History:  Procedure Laterality Date  . MOUTH SURGERY      OB History    No data available       Home Medications    Prior to Admission medications   Medication Sig Start Date End Date Taking? Authorizing Provider  ADVAIR DISKUS  500-50 MCG/DOSE AEPB Inhale 1 puff into the lungs 2 times a day as needed dor wheezing 07/25/14  Yes [provider]  albuterol (PROVENTIL HFA;VENTOLIN HFA) 108 (90 BASE) MCG/ACT inhaler Inhale 1-2 puffs into the lungs every 4 (four) hours as needed for wheezing or shortness of breath.   Yes [provider]  EPINEPHrine 0.3 mg/0.3 mL IJ SOAJ injection Inject 0.3 mg into the muscle once. As needed for allergic reaction   Yes [provider]  ESTARYLLA 0.25-35 MG-MCG tablet Take 1 tablet by mouth daily.  10/07/16  Yes [provider]  gabapentin (NEURONTIN) 300 MG capsule Take 300 mg by mouth 3 (three) times daily as needed (pain).  06/03/16  Yes [provider]  ipratropium-albuterol (DUONEB) 0.5-2.5 (3) MG/3ML SOLN Inhale into lungs 4 times a day as needed for wheezing 07/25/14  Yes [provider]  nitroGLYCERIN (NITROSTAT) 0.4 MG SL tablet Place 1 tablet (0.4 mg total) under the tongue every 5 (five) minutes as needed for chest pain. 06/17/14  Yes Jerline Pain, MD  propranolol (INDERAL) 20 MG tablet Take 1 tablet (20 mg total) by mouth 2 (two) times daily as needed. 06/06/16  Yes Jerline Pain, MD  silver sulfADIAZINE (SILVADENE) 1 % cream Apply 1 application topically daily. Apply sparingly. Patient taking differently: Apply  1 application topically daily as needed (burn). Apply sparingly. 09/16/16  Yes Nat Christen, MD  predniSONE (DELTASONE) 20 MG tablet Take 1 tablet (20 mg total) by mouth daily with breakfast. 4 tablets for 2 days, 3 tablets for 2 days, 2 tablets for 2 days, one tablet for 2 days, one half tablet for 2 days Patient not taking: Reported on 12/03/2016 09/16/16   Nat Christen, MD    Family History Family History  Problem Relation Age of Onset  . Arrhythmia Mother   . Colon cancer Neg Hx     Social History Social History  Substance Use Topics  . Smoking status: Former Smoker    Packs/day: 0.01    Years: 2.00    Types: Cigarettes     Quit date: 02/14/2014  . Smokeless tobacco: Never Used     Comment: rare, social smoker  . Alcohol use 0.0 oz/week     Comment: occ     Allergies   Adhesive [tape]; Blue dyes (parenteral); Contrast media [iodinated diagnostic agents]; Corn-containing products; Green dyes; Nickel; Onion; Other; Cortisporin [bacitra-neomycin-polymyxin-hc]; Yellow jacket venom; and Petroleum distillate   Review of Systems Review of Systems  All other systems reviewed and are negative.    Physical Exam Updated Vital Signs BP 118/75   Pulse (!) 111   Resp 11   Ht 1.549 m (5\' 1" )   Wt 51.3 kg (113 lb)   SpO2 98%   BMI 21.35 kg/m   Physical Exam  Constitutional: She appears well-developed and well-nourished. No distress.  HENT:  Head: Normocephalic and atraumatic.  Right Ear: External ear normal.  Left Ear: External ear normal.  Eyes: Conjunctivae are normal. Right eye exhibits no discharge. Left eye exhibits no discharge. No scleral icterus.  Neck: Neck supple. No tracheal deviation present.  Cardiovascular: Regular rhythm and intact distal pulses.  Tachycardia present.   Pulmonary/Chest: Effort normal and breath sounds normal. No stridor. No respiratory distress. She has no wheezes. She has no rales.  Abdominal: Soft. Bowel sounds are normal. She exhibits no distension. There is tenderness in the epigastric area. There is no rebound and no guarding.  Musculoskeletal: She exhibits no edema or tenderness.  Neurological: She is alert. She has normal strength. No cranial nerve deficit (no facial droop, extraocular movements intact, no slurred speech) or sensory deficit. She exhibits normal muscle tone. She displays no seizure activity. Coordination normal.  Skin: Skin is warm and dry. No rash noted.  Psychiatric: Her mood appears anxious.  Nursing note and vitals reviewed.    ED Treatments / Results  Labs (all labs ordered are listed, but only abnormal results are displayed) Labs Reviewed   COMPREHENSIVE METABOLIC PANEL - Abnormal; Notable for the following:       Result Value   Potassium 3.4 (*)    Glucose, Bld 107 (*)    All other components within normal limits  CBC  MAGNESIUM  LIPASE, BLOOD  I-STAT BETA HCG BLOOD, ED ([facility], [facility], [facility] ONLY)    EKG  EKG Interpretation  Date/Time:  Tuesday December 03 2016 14:19:14 EDT Ventricular Rate:  129 PR Interval:  130 QRS Duration: 78 QT Interval:  298 QTC Calculation: 436 R Axis:   90 Text Interpretation:  Sinus tachycardia Rightward axis Cannot rule out Anterior infarct , age undetermined Abnormal ECG Since last tracing rate faster Confirmed by Dorie Rank [phone]) on 12/03/2016 2:31:05 PM       Radiology No results found.  Procedures Procedures (including critical care time)  Medications Ordered in ED Medications  propranolol (INDERAL) tablet 10 mg (5 mg Oral Given 12/03/16 1534)     Initial Impression / Assessment and Plan / ED Course  I have reviewed the triage vital signs and the nursing notes.  Pertinent labs & imaging results that were available during my care of the patient were reviewed by me and considered in my medical decision making (see chart for details).   Pt presents with recurrent tachycardia. She has a history of recurrent episodes of this. Patient also has recurrent episodes of chest pain and abdominal pain. Previous workups have been nondiagnostic.Patient's heart rate improved with a dose of propranolol which she has been prescribed in the past. At the bedside her heart rate was down into the 80s although it does increase up into the low 100s with speaking.  At this time there does not appear to be any evidence of an acute emergency medical condition and the patient appears stable for discharge with appropriate outpatient follow up.   Final Clinical Impressions(s) / ED Diagnoses   Final diagnoses:  Sinus tachycardia    New Prescriptions New Prescriptions   No medications on file       Dorie Rank, MD 12/03/16 [phone]

## 2016-12-03 NOTE — ED Triage Notes (Signed)
Pt c/o heart racing that started that started about 45 mins ago. Pt's HR fluctuating 130-160bpm. Pt reports SOB but no more than usual. Pt has propranolol to use at home if her heart rate gets high, but pt reports her BP was 97/56 today and so she didn't take it due to her blood pressure being low. Pt also reports dizziness, lightheadedness.

## 2016-12-03 NOTE — Discharge Instructions (Signed)
Follow up with your doctor, return as needed for worsening symptoms

## 2016-12-03 NOTE — ED Notes (Signed)
ED Provider at bedside. 

## 2016-12-03 NOTE — Telephone Encounter (Signed)
Made them aware that the information will be forwarded.

## 2016-12-03 NOTE — ED Notes (Signed)
Pts mother states, "you would be better off not doing an IV. She becomes really irritated with an IV and she begins to swell in whatever arm you put the IV in." EDP made aware.

## 2016-12-03 NOTE — Telephone Encounter (Signed)
New message       FYI Pt is at [facility] ER because of heart rate 130-160 bpm.  Calling to make Dr Marlou Porch aware

## 2016-12-05 NOTE — Progress Notes (Signed)
Cardiology Office Note   Date:  12/06/2016   ID:  Traci Hensley, DOB Jun 28, 1990, MRN [id number]  PCP:  Sinda Du, MD  Cardiologist:  Candee Furbish, MD  Chief Complaint  Patient presents with  . Chest Pain      History of Present Illness: Traci Hensley is a 26 y.o. female who presents for  ongoing assessment and management of atypical chest pain with frequent admissions to the ER for recurrent discomfort, with other history to include PTSD and anxiety. She had been worked up and ruled out for ACS  each time. The patient has significant psychiatric issues, is to include bipolar one disorder, anxiety and depression.   She comes today with multiple complaints. She states that her HR continues to increase at times, and go up at high as 160 bpm. She states that she does not take the propanolol as this causes her to have low BP and get dizzy. She is also highly sensitive to the albuterol treatments causing her to feel jittery and have rapid HR. She complains of chronic stomach pain as well. She has been seen by GI and was told that she did not have any issues. She continues with PTSD symptoms but refuses psychological treatment.   Past Medical History:  Diagnosis Date  . Agoraphobia   . Anxiety   . Bipolar 1 disorder ([place])   . Chronic abdominal pain   . Chronic chest pain   . Depression   . PTSD (post-traumatic stress disorder)   . Scoliosis     Past Surgical History:  Procedure Laterality Date  . MOUTH SURGERY       Current Outpatient Prescriptions  Medication Sig Dispense Refill  . ADVAIR DISKUS 500-50 MCG/DOSE AEPB Inhale 1 puff into the lungs 2 times a day as needed dor wheezing  12  . albuterol (PROVENTIL HFA;VENTOLIN HFA) 108 (90 BASE) MCG/ACT inhaler Inhale 1-2 puffs into the lungs every 4 (four) hours as needed for wheezing or shortness of breath.    . cyclobenzaprine (FLEXERIL) 10 MG tablet Take 1 tablet by mouth 2 (two) times daily as needed.    Marland Kitchen EPINEPHrine 0.3  mg/0.3 mL IJ SOAJ injection Inject 0.3 mg into the muscle once. As needed for allergic reaction    . ESTARYLLA 0.25-35 MG-MCG tablet Take 1 tablet by mouth daily.     Marland Kitchen gabapentin (NEURONTIN) 300 MG capsule Take 300 mg by mouth 3 (three) times daily as needed (pain).   2  . ipratropium-albuterol (DUONEB) 0.5-2.5 (3) MG/3ML SOLN Inhale into lungs 4 times a day as needed for wheezing  12  . nitroGLYCERIN (NITROSTAT) 0.4 MG SL tablet Place 1 tablet (0.4 mg total) under the tongue every 5 (five) minutes as needed for chest pain. 25 tablet 12  . predniSONE (DELTASONE) 20 MG tablet Take 1 tablet (20 mg total) by mouth daily with breakfast. 4 tablets for 2 days, 3 tablets for 2 days, 2 tablets for 2 days, one tablet for 2 days, one half tablet for 2 days 21 tablet 0  . propranolol (INDERAL) 20 MG tablet Take 1 tablet (20 mg total) by mouth 2 (two) times daily as needed. 60 tablet prn  . silver sulfADIAZINE (SILVADENE) 1 % cream Apply 1 application topically daily. Apply sparingly. (Patient taking differently: Apply 1 application topically daily as needed (burn). Apply sparingly.) 50 g 1   No current facility-administered medications for this visit.     Allergies:   Adhesive [tape]; Blue dyes (parenteral); Contrast  media [iodinated diagnostic agents]; Corn-containing products; Green dyes; Nickel; Onion; Other; Cortisporin [bacitra-neomycin-polymyxin-hc]; Yellow jacket venom; and Petroleum distillate    Social History:  The patient  reports that she quit smoking about 2 years ago. Her smoking use included Cigarettes. She has a 0.02 pack-year smoking history. She has never used smokeless tobacco. She reports that she drinks alcohol. She reports that she does not use drugs.   Family History:  The patient's family history includes Arrhythmia in her mother.    ROS: All other systems are reviewed and negative. Unless otherwise mentioned in H&P    PHYSICAL EXAM: VS:  BP 110/64   Pulse 98   Ht 5\' 1"  (1.549  m)   Wt 118 lb 12.8 oz (53.9 kg)   SpO2 99%   BMI 22.45 kg/m  , BMI Body mass index is 22.45 kg/m. GEN: Well nourished, well developed, in no acute distress  HEENT: normal  Neck: no JVD, carotid bruits, or masses Cardiac: RRR; no murmurs, rubs, or gallops,no edema  Respiratory:  clear to auscultation bilaterally, normal work of breathing GI: soft, nontender, nondistended, + BS MS: no deformity or atrophy  Skin: warm and dry, no rash Neuro:  Strength and sensation are intact Psych: euthymic mood, full affect   Recent Labs: 12/03/2016: ALT 48; BUN 14; Creatinine, Ser 0.91; Hemoglobin 13.9; Magnesium 1.8; Platelets 224; Potassium 3.4; Sodium 138    Lipid Panel No results found for: CHOL, TRIG, HDL, CHOLHDL, VLDL, LDLCALC, LDLDIRECT    Wt Readings from Last 3 Encounters:  12/06/16 118 lb 12.8 oz (53.9 kg)  12/03/16 113 lb (51.3 kg)  11/30/16 113 lb (51.3 kg)      Other studies Reviewed:  Echocardiogram 04/29/13 - Left ventricle: The cavity size was normal. Wall thickness was normal. Systolic function was normal. The estimated ejection fraction was in the range of 60% to 65%. Wall motion was normal; there were no regional wall motion abnormalities. Left ventricular diastolic function parameters were normal. - Mitral valve: Mildly thickened leaflets . Trivial regurgitation. - Left atrium: The atrium was normal in size. - Inferior vena cava: The vessel was normal in size; the respirophasic diameter changes were in the normal range (= 50%); findings are consistent with normal central venous pressure. - Pericardium, extracardiac: There was no pericardial effusion.  ASSESSMENT AND PLAN:  1.Palpitaitons; States that she cannot take propanolol as this causes her to have low BP. She is reluctant to wear cardiac monitor because she has allergies to the electrodes. A loop recorder has been discussed but she is worried about metal allergies. I will change her  propanolol to 10 mg from 20 mg prn. She requests liquid form as this is easier for her to swallow. I will check thyroid profile as one has not been checked since 2014.   Echocardiogram is ordered. She is requesting a female sonographer. This is noted on scheduling.   2. Asthma: She is being followed by Dr. Luan Pulling for this. Albuterol is causing significant palpitations when she uses this. I will change her to Xopenex neb treatments at very low dose of 0.31 mg Q 6-8 hours. She will need close follow up with Dr. Luan Pulling for management and titration of this .   3. Psychiatric Issues to include anxiety and Bipolar disorder with PTSD: She refuses referral to psychiatry at this time. Will defer to PCP to discuss further.   4. Dizziness: Chronic positional: Orthostatics are negative.     Current medicines are reviewed at length with  the patient today.  I have spent 45 minutes with this patient and her partner discussing multiple symptoms, answering questions and plan of care.   Labs/ tests ordered today include: TSH, Echocardiogram.   Phill Myron. West Pugh, ANP, AACC   12/06/2016 3:34 PM    [facility] [address] [place], [place] [address] Phone: [phone]; Fax: [phone]

## 2016-12-06 ENCOUNTER — Ambulatory Visit (INDEPENDENT_AMBULATORY_CARE_PROVIDER_SITE_OTHER): Payer: Medicare Other | Admitting: Adult Health

## 2016-12-06 ENCOUNTER — Encounter: Payer: Self-pay | Admitting: Adult Health

## 2016-12-06 VITALS — BP 130/90 | HR 84 | Ht 61.0 in | Wt 118.8 lb

## 2016-12-06 DIAGNOSIS — R000 Tachycardia, unspecified: Secondary | ICD-10-CM | POA: Diagnosis not present

## 2016-12-06 DIAGNOSIS — R002 Palpitations: Secondary | ICD-10-CM | POA: Diagnosis not present

## 2016-12-06 DIAGNOSIS — J4520 Mild intermittent asthma, uncomplicated: Secondary | ICD-10-CM

## 2016-12-06 MED ORDER — LEVALBUTEROL HCL 0.31 MG/3ML IN NEBU: 1.0000 | INHALATION_SOLUTION | Freq: Four times a day (QID) | RESPIRATORY_TRACT | 2 refills | Status: DC | PRN

## 2016-12-06 MED ORDER — PROPRANOLOL HCL 10 MG PO TABS: 10.0000 mg | ORAL_TABLET | Freq: Two times a day (BID) | ORAL | 2 refills | Status: DC | PRN

## 2016-12-06 MED ORDER — PROPRANOLOL HCL 20 MG/5ML PO SOLN: 10.0000 mg | Freq: Two times a day (BID) | ORAL | 3 refills | Status: DC | PRN

## 2016-12-06 NOTE — Patient Instructions (Signed)
Medication Instructions:   Your physician has recommended you make the following change in your medication:   Stop albuterol nebulizer.  Start xopenex 0.31 mg breathing treatment every 6-8 hours as needed for wheezing or shortness of breath.  Decrease propranolol to 10 mg by mouth twice daily as needed.  Continue all other medications the same.  Labwork:  Your physician recommends that you return for lab work to check your thyroid profile.  Testing/Procedures: Your physician has requested that you have an echocardiogram. Echocardiography is a painless test that uses sound waves to create images of your heart. It provides your doctor with information about the size and shape of your heart and how well your heart's chambers and valves are working. This procedure takes approximately one hour. There are no restrictions for this procedure.  Follow-Up:  Your physician recommends that you schedule a follow-up appointment in: 1 month.  Any Other Special Instructions Will Be Listed Below (If Applicable).  If you need a refill on your cardiac medications before your next appointment, please call your pharmacy.

## 2016-12-09 ENCOUNTER — Telehealth: Payer: Self-pay | Admitting: Adult Health

## 2016-12-09 ENCOUNTER — Other Ambulatory Visit: Payer: Self-pay | Admitting: Adult Health

## 2016-12-09 DIAGNOSIS — R000 Tachycardia, unspecified: Secondary | ICD-10-CM | POA: Diagnosis not present

## 2016-12-09 DIAGNOSIS — R002 Palpitations: Secondary | ICD-10-CM | POA: Diagnosis not present

## 2016-12-09 MED ORDER — LEVALBUTEROL HCL 0.31 MG/3ML IN NEBU: 1.0000 | INHALATION_SOLUTION | Freq: Four times a day (QID) | RESPIRATORY_TRACT | 12 refills | Status: DC | PRN

## 2016-12-09 NOTE — Telephone Encounter (Signed)
Pt is needing a PA for her levalbuterol (XOPENEX) 0.31 MG/3ML nebulizer solution [[id number]]  For her [employer] to cover it -- please call member services @ [phone].

## 2016-12-09 NOTE — Addendum Note (Signed)
Addended by: Levonne Hubert on: 12/09/2016 05:48 PM   Modules accepted: Orders

## 2016-12-10 ENCOUNTER — Telehealth: Payer: Self-pay | Admitting: *Deleted

## 2016-12-10 NOTE — Telephone Encounter (Signed)
Error

## 2016-12-10 NOTE — Telephone Encounter (Signed)
[employer] for PA. States that this med is not covered under their plan and that we should call [facility] and have them run it under Medicare part B. [facility] unable to get medication approved at this time. [facility] states that it is coming back that there is a problem with the provider on the script. I have called to notify the Pt's POA that we are currently working on the [place]. She became very upset and loud on the phone. I did explained to her that I would continue to work and get in touch with Dr. Luan Pulling office in the morning. I again spoke with the [place] and they told me that would work on the PA and notify me, and the POA if it was approved.

## 2016-12-10 NOTE — Telephone Encounter (Signed)
Spoke with [place] today. Traci Sims, DNP has been added to medicare and the Xopenex 0.31 mg Neb has been ordered. Medication should arrive today arrive today in the pharmacy. Kyra Manges POA has been notified that the Xopenex has been ordered and should arrive today.

## 2016-12-11 ENCOUNTER — Ambulatory Visit (HOSPITAL_COMMUNITY)
Admission: RE | Admit: 2016-12-11 | Discharge: 2016-12-11 | Disposition: A | Discharge status: Home / Self Care | Payer: Medicare Other | Source: Ambulatory Visit | Attending: Adult Health | Admitting: Adult Health

## 2016-12-11 DIAGNOSIS — Z87891 Personal history of nicotine dependence: Secondary | ICD-10-CM | POA: Insufficient documentation

## 2016-12-11 DIAGNOSIS — F4310 Post-traumatic stress disorder, unspecified: Secondary | ICD-10-CM | POA: Diagnosis not present

## 2016-12-11 DIAGNOSIS — I209 Angina pectoris, unspecified: Secondary | ICD-10-CM | POA: Insufficient documentation

## 2016-12-11 DIAGNOSIS — R002 Palpitations: Secondary | ICD-10-CM | POA: Diagnosis not present

## 2016-12-11 DIAGNOSIS — F319 Bipolar disorder, unspecified: Secondary | ICD-10-CM | POA: Insufficient documentation

## 2016-12-11 DIAGNOSIS — R000 Tachycardia, unspecified: Secondary | ICD-10-CM | POA: Diagnosis not present

## 2016-12-11 NOTE — Progress Notes (Signed)
*  PRELIMINARY RESULTS* Echocardiogram 2D Echocardiogram has been performed.  Leavy Cella 12/11/2016, 9:28 AM

## 2016-12-12 LAB — T3, FREE: T3, Free: 3.4 pg/mL (ref 2.3–4.2)

## 2016-12-12 LAB — T4, FREE: Free T4: 0.9 ng/dL (ref 0.8–1.8)

## 2016-12-12 LAB — TSH: TSH: 1.23 mIU/L

## 2017-01-05 NOTE — Progress Notes (Signed)
Cardiology Office Note   Date:  01/06/2017   ID:  Traci Hensley, DOB 08/20/1990, MRN [id number]  PCP:  Hawkins, Edward, MD  Cardiologist:  Mark Skains, MD  Chief Complaint  Patient presents with  . Palpitations    History of Present Illness: Traci Hensley is a 25 y.o. female who presents for ongoing assessment and management of atypical chest pain, frequent admission for same, other history to include PTSD and significant psychiatric issues but refuses treatment. She has multiple medication allergies. She was placed on higher doses of propanolol at 20 mg daily in liquid form to make this easier to swallow. She refused cardiac monitor. She was also started on Xopenex inhaler at low dose to assist with asthma and to avoid worsening of palpitations. She was advised to see GI and pulmonary for multiple complaints of non-cardiac etiology.   Echocardiogram 12/11/2016 Left ventricle: The cavity size was normal. Wall thickness was   normal. Systolic function was normal. The estimated ejection   fraction was in the range of 60% to 65%. Wall motion was normal;   there were no regional wall motion abnormalities. Left   ventricular diastolic function parameters were normal.  She is here today feeling some better.  The Xopenex has helped a lot concerning rapid heart rhythm as opposed to using albuterol inhalers.  She also continues to tolerate propanolol which we have changed to a liquid form.  She continues to have some anxiety concerning her heart rate and associated hypotension.  Review of her labs did reveal low normal potassium.  She states she is always had low potassium and has not been able to maintain a more therapeutic level due to a lot of food allergies.   Past Medical History:  Diagnosis Date  . Agoraphobia   . Anxiety   . Bipolar 1 disorder (HCC)   . Chronic abdominal pain   . Chronic chest pain   . Depression   . PTSD (post-traumatic stress disorder)   . Scoliosis     Past  Surgical History:  Procedure Laterality Date  . MOUTH SURGERY       Current Outpatient Prescriptions  Medication Sig Dispense Refill  . ADVAIR DISKUS 500-50 MCG/DOSE AEPB Inhale 1 puff into the lungs 2 times a day as needed dor wheezing  12  . chlorhexidine (PERIDEX) 0.12 % solution SWISH 15 MLS 1- 2 TIMES QD  2  . cyclobenzaprine (FLEXERIL) 10 MG tablet Take 1 tablet by mouth 2 (two) times daily as needed.    . EPINEPHrine 0.3 mg/0.3 mL IJ SOAJ injection Inject 0.3 mg into the muscle once. As needed for allergic reaction    . ESTARYLLA 0.25-35 MG-MCG tablet Take 1 tablet by mouth daily.     . gabapentin (NEURONTIN) 300 MG capsule Take 300 mg by mouth 3 (three) times daily as needed (pain).   2  . levalbuterol (XOPENEX HFA) 45 MCG/ACT inhaler Inhale 2 puffs into the lungs every 8 (eight) hours as needed.     . levalbuterol (XOPENEX) 0.31 MG/3ML nebulizer solution Take 3 mLs (0.31 mg total) by nebulization every 6 (six) hours as needed for wheezing or shortness of breath. 72 mL 12  . nitroGLYCERIN (NITROSTAT) 0.4 MG SL tablet Place 1 tablet (0.4 mg total) under the tongue every 5 (five) minutes as needed for chest pain. 25 tablet 12  . predniSONE (DELTASONE) 20 MG tablet Take 1 tablet (20 mg total) by mouth daily with breakfast. 4 tablets for 2 days, 3   tablets for 2 days, 2 tablets for 2 days, one tablet for 2 days, one half tablet for 2 days 21 tablet 0  . propranolol (INDERAL) 20 MG/5ML solution Take 2.5 mLs (10 mg total) by mouth 2 (two) times daily as needed. 200 mL 3  . silver sulfADIAZINE (SILVADENE) 1 % cream Apply 1 application topically daily. Apply sparingly. (Patient taking differently: Apply 1 application topically daily as needed (burn). Apply sparingly.) 50 g 1   No current facility-administered medications for this visit.     Allergies:   Adhesive [tape]; Blue dyes (parenteral); Contrast media [iodinated diagnostic agents]; Corn-containing products; Green dyes; Nickel; Onion;  Other; Cortisporin [bacitra-neomycin-polymyxin-hc]; Yellow jacket venom; and Petroleum distillate    Social History:  The patient  reports that she quit smoking about 2 years ago. Her smoking use included Cigarettes. She has a 0.02 pack-year smoking history. She has never used smokeless tobacco. She reports that she drinks alcohol. She reports that she does not use drugs.   Family History:  The patient's family history includes Arrhythmia in her mother.    ROS: All other systems are reviewed and negative. Unless otherwise mentioned in H&P    PHYSICAL EXAM: VS:  BP 110/68 (BP Location: Right Arm)   Pulse 73   Ht 5' 3" (1.6 m)   Wt 118 lb (53.5 kg)   SpO2 93%   BMI 20.90 kg/m  , BMI Body mass index is 20.9 kg/m. GEN: Well nourished, well developed, in no acute distress  HEENT: normal  Neck: no JVD, carotid bruits, or masses Cardiac: RRR; no murmurs, rubs, or gallops,no edema  Respiratory:  clear to auscultation bilaterally, normal work of breathing GI: soft, nontender, nondistended, + BS MS: no deformity or atrophy  Skin: warm and dry, no rash Neuro:  Strength and sensation are intact Psych: euthymic mood, full affect.  Very talkative.   Recent Labs: 12/03/2016: ALT 48; BUN 14; Creatinine, Ser 0.91; Hemoglobin 13.9; Magnesium 1.8; Platelets 224; Potassium 3.4; Sodium 138 12/09/2016: TSH 1.23    Lipid Panel No results found for: CHOL, TRIG, HDL, CHOLHDL, VLDL, LDLCALC, LDLDIRECT    Wt Readings from Last 3 Encounters:  01/06/17 118 lb (53.5 kg)  12/06/16 118 lb 12.8 oz (53.9 kg)  12/03/16 113 lb (51.3 kg)     ASSESSMENT AND PLAN:  1.  Tachycardia: The patient refuses a cardiac monitor and has metal allergies and refuses to have a loop recorder placed.  The patient is very symptomatic with rapid heart rate causing her to feel dizzy.  However she becomes very concerned about hypotension and "I get up and run around" in order to keep her blood pressure elevated.  The patient  has significant PTSD and is intolerant to a lot of medications as well as anxiety concerning female physicians and other female  clinicians.   She will continue on her current medication regimen as she appears to be tolerating it better.    2.  Hypokalemia: Mild but may be causing some of her palpitations.  Although this is unclear, I will give her potassium replacement to maintain potassium of at least 4.0.  This is requested to be in a liquid form as she is having trouble with swallowing pills.  Follow-up be met in 1 week.  3.  PTSD: I discussed with her the need to be seen by psychiatry for ongoing management.  She does have a great deal of anxiety but has not been placed on any antianxiety medications.  She states   her former PCP had provided these to her.  I have explained to her that cardiology will not be providing antianxiety medications and it would be best to be followed by psychiatry concerning dosing, especially in light of the fact that she is very sensitive to multiple medications.  The patient states that she is concerned that she will be forced in to "talk therapy" which she is not interested in at this time.  She states she has tried it in the past and it continued to re-traumatize her to discuss  traumatic events she is willing to consider seeing a psychiatrist at a later date.   Current medicines are reviewed at length with the patient today.    Labs/ tests ordered today include: BMET  M.  DNP, ANP, AACC   01/06/2017 4:48 PM    [facility] [address], [place], [place] [address] Phone: [phone]; Fax: [phone] 

## 2017-01-06 ENCOUNTER — Ambulatory Visit (INDEPENDENT_AMBULATORY_CARE_PROVIDER_SITE_OTHER): Payer: Medicare Other | Admitting: Adult Health

## 2017-01-06 ENCOUNTER — Encounter: Payer: Self-pay | Admitting: Adult Health

## 2017-01-06 VITALS — BP 110/68 | HR 73 | Ht 63.0 in | Wt 118.0 lb

## 2017-01-06 DIAGNOSIS — R002 Palpitations: Secondary | ICD-10-CM

## 2017-01-06 DIAGNOSIS — F4310 Post-traumatic stress disorder, unspecified: Secondary | ICD-10-CM | POA: Diagnosis not present

## 2017-01-06 DIAGNOSIS — Z79899 Other long term (current) drug therapy: Secondary | ICD-10-CM | POA: Diagnosis not present

## 2017-01-06 NOTE — Patient Instructions (Signed)
Medication Instructions:  START POTASSIUM (LIQUID) 20 MEQ DAILY   Labwork: 1 WEEK  BMET   Testing/Procedures: NONE  Follow-Up: Your physician wants you to follow-up in: 6 MONTHS .  You will receive a reminder letter in the mail two months in advance. If you don't receive a letter, please call our office to schedule the follow-up appointment.   Any Other Special Instructions Will Be Listed Below (If Applicable).     If you need a refill on your cardiac medications before your next appointment, please call your pharmacy.

## 2017-01-13 DIAGNOSIS — H40033 Anatomical narrow angle, bilateral: Secondary | ICD-10-CM | POA: Diagnosis not present

## 2017-01-13 DIAGNOSIS — H04123 Dry eye syndrome of bilateral lacrimal glands: Secondary | ICD-10-CM | POA: Diagnosis not present

## 2017-01-15 DIAGNOSIS — J9611 Chronic respiratory failure with hypoxia: Secondary | ICD-10-CM | POA: Diagnosis not present

## 2017-01-15 DIAGNOSIS — J4550 Severe persistent asthma, uncomplicated: Secondary | ICD-10-CM | POA: Diagnosis not present

## 2017-01-15 DIAGNOSIS — J309 Allergic rhinitis, unspecified: Secondary | ICD-10-CM | POA: Diagnosis not present

## 2017-01-15 DIAGNOSIS — I10 Essential (primary) hypertension: Secondary | ICD-10-CM | POA: Diagnosis not present

## 2017-01-16 DIAGNOSIS — F4000 Agoraphobia, unspecified: Secondary | ICD-10-CM | POA: Diagnosis not present

## 2017-01-16 DIAGNOSIS — F419 Anxiety disorder, unspecified: Secondary | ICD-10-CM | POA: Diagnosis not present

## 2017-01-16 DIAGNOSIS — Z1331 Encounter for screening for depression: Secondary | ICD-10-CM | POA: Diagnosis not present

## 2017-01-16 DIAGNOSIS — I10 Essential (primary) hypertension: Secondary | ICD-10-CM | POA: Diagnosis not present

## 2017-01-16 DIAGNOSIS — N946 Dysmenorrhea, unspecified: Secondary | ICD-10-CM | POA: Diagnosis not present

## 2017-01-18 DIAGNOSIS — E7800 Pure hypercholesterolemia, unspecified: Secondary | ICD-10-CM | POA: Diagnosis not present

## 2017-01-18 DIAGNOSIS — E559 Vitamin D deficiency, unspecified: Secondary | ICD-10-CM | POA: Diagnosis not present

## 2017-01-18 DIAGNOSIS — R5383 Other fatigue: Secondary | ICD-10-CM | POA: Diagnosis not present

## 2017-03-29 DIAGNOSIS — M549 Dorsalgia, unspecified: Secondary | ICD-10-CM | POA: Diagnosis not present

## 2017-03-29 DIAGNOSIS — Z79899 Other long term (current) drug therapy: Secondary | ICD-10-CM | POA: Diagnosis not present

## 2017-03-29 DIAGNOSIS — Z809 Family history of malignant neoplasm, unspecified: Secondary | ICD-10-CM | POA: Diagnosis not present

## 2017-03-29 DIAGNOSIS — M545 Low back pain: Secondary | ICD-10-CM | POA: Diagnosis not present

## 2017-03-29 DIAGNOSIS — Z8249 Family history of ischemic heart disease and other diseases of the circulatory system: Secondary | ICD-10-CM | POA: Diagnosis not present

## 2017-03-29 DIAGNOSIS — Z87891 Personal history of nicotine dependence: Secondary | ICD-10-CM | POA: Diagnosis not present

## 2017-03-29 DIAGNOSIS — Z833 Family history of diabetes mellitus: Secondary | ICD-10-CM | POA: Diagnosis not present

## 2017-03-31 DIAGNOSIS — M9901 Segmental and somatic dysfunction of cervical region: Secondary | ICD-10-CM | POA: Diagnosis not present

## 2017-03-31 DIAGNOSIS — M9903 Segmental and somatic dysfunction of lumbar region: Secondary | ICD-10-CM | POA: Diagnosis not present

## 2017-03-31 DIAGNOSIS — M9902 Segmental and somatic dysfunction of thoracic region: Secondary | ICD-10-CM | POA: Diagnosis not present

## 2017-03-31 DIAGNOSIS — M545 Low back pain: Secondary | ICD-10-CM | POA: Diagnosis not present

## 2017-03-31 DIAGNOSIS — M546 Pain in thoracic spine: Secondary | ICD-10-CM | POA: Diagnosis not present

## 2017-03-31 DIAGNOSIS — M9905 Segmental and somatic dysfunction of pelvic region: Secondary | ICD-10-CM | POA: Diagnosis not present

## 2017-04-02 DIAGNOSIS — I10 Essential (primary) hypertension: Secondary | ICD-10-CM | POA: Diagnosis not present

## 2017-04-02 DIAGNOSIS — N946 Dysmenorrhea, unspecified: Secondary | ICD-10-CM | POA: Diagnosis not present

## 2017-04-02 DIAGNOSIS — E876 Hypokalemia: Secondary | ICD-10-CM | POA: Diagnosis not present

## 2017-04-02 DIAGNOSIS — F339 Major depressive disorder, recurrent, unspecified: Secondary | ICD-10-CM | POA: Diagnosis not present

## 2017-04-02 DIAGNOSIS — Z1389 Encounter for screening for other disorder: Secondary | ICD-10-CM | POA: Diagnosis not present

## 2017-04-02 DIAGNOSIS — F419 Anxiety disorder, unspecified: Secondary | ICD-10-CM | POA: Diagnosis not present

## 2017-04-02 DIAGNOSIS — Z0000 Encounter for general adult medical examination without abnormal findings: Secondary | ICD-10-CM | POA: Diagnosis not present

## 2017-04-14 DIAGNOSIS — M9905 Segmental and somatic dysfunction of pelvic region: Secondary | ICD-10-CM | POA: Diagnosis not present

## 2017-04-14 DIAGNOSIS — M9901 Segmental and somatic dysfunction of cervical region: Secondary | ICD-10-CM | POA: Diagnosis not present

## 2017-04-14 DIAGNOSIS — M9903 Segmental and somatic dysfunction of lumbar region: Secondary | ICD-10-CM | POA: Diagnosis not present

## 2017-04-14 DIAGNOSIS — E7800 Pure hypercholesterolemia, unspecified: Secondary | ICD-10-CM | POA: Diagnosis not present

## 2017-04-14 DIAGNOSIS — M545 Low back pain: Secondary | ICD-10-CM | POA: Diagnosis not present

## 2017-04-14 DIAGNOSIS — E876 Hypokalemia: Secondary | ICD-10-CM | POA: Diagnosis not present

## 2017-04-14 DIAGNOSIS — M9902 Segmental and somatic dysfunction of thoracic region: Secondary | ICD-10-CM | POA: Diagnosis not present

## 2017-04-14 DIAGNOSIS — E559 Vitamin D deficiency, unspecified: Secondary | ICD-10-CM | POA: Diagnosis not present

## 2017-04-14 DIAGNOSIS — R5383 Other fatigue: Secondary | ICD-10-CM | POA: Diagnosis not present

## 2017-04-14 DIAGNOSIS — M546 Pain in thoracic spine: Secondary | ICD-10-CM | POA: Diagnosis not present

## 2017-04-22 DIAGNOSIS — E876 Hypokalemia: Secondary | ICD-10-CM | POA: Diagnosis not present

## 2017-04-22 DIAGNOSIS — E7800 Pure hypercholesterolemia, unspecified: Secondary | ICD-10-CM | POA: Diagnosis not present

## 2017-04-22 DIAGNOSIS — E559 Vitamin D deficiency, unspecified: Secondary | ICD-10-CM | POA: Diagnosis not present

## 2017-04-22 DIAGNOSIS — R5383 Other fatigue: Secondary | ICD-10-CM | POA: Diagnosis not present

## 2017-04-23 DIAGNOSIS — R000 Tachycardia, unspecified: Secondary | ICD-10-CM | POA: Diagnosis not present

## 2017-04-23 DIAGNOSIS — Z8261 Family history of arthritis: Secondary | ICD-10-CM | POA: Diagnosis not present

## 2017-04-23 DIAGNOSIS — E876 Hypokalemia: Secondary | ICD-10-CM | POA: Diagnosis not present

## 2017-04-23 DIAGNOSIS — E559 Vitamin D deficiency, unspecified: Secondary | ICD-10-CM | POA: Diagnosis not present

## 2017-04-24 DIAGNOSIS — G603 Idiopathic progressive neuropathy: Secondary | ICD-10-CM | POA: Diagnosis not present

## 2017-04-24 DIAGNOSIS — E559 Vitamin D deficiency, unspecified: Secondary | ICD-10-CM | POA: Diagnosis not present

## 2017-04-24 DIAGNOSIS — Z79899 Other long term (current) drug therapy: Secondary | ICD-10-CM | POA: Diagnosis not present

## 2017-04-24 DIAGNOSIS — F4001 Agoraphobia with panic disorder: Secondary | ICD-10-CM | POA: Diagnosis not present

## 2017-04-24 DIAGNOSIS — E649 Sequelae of unspecified nutritional deficiency: Secondary | ICD-10-CM | POA: Diagnosis not present

## 2017-04-24 DIAGNOSIS — M4134 Thoracogenic scoliosis, thoracic region: Secondary | ICD-10-CM | POA: Diagnosis not present

## 2017-04-25 ENCOUNTER — Other Ambulatory Visit: Payer: Self-pay | Admitting: Neurology

## 2017-04-25 DIAGNOSIS — G4459 Other complicated headache syndrome: Secondary | ICD-10-CM

## 2017-04-25 DIAGNOSIS — G992 Myelopathy in diseases classified elsewhere: Secondary | ICD-10-CM

## 2017-05-03 ENCOUNTER — Ambulatory Visit
Admission: RE | Admit: 2017-05-03 | Discharge: 2017-05-03 | Disposition: A | Discharge status: Home / Self Care | Payer: Medicaid Other | Source: Ambulatory Visit | Attending: Neurology | Admitting: Neurology

## 2017-05-03 ENCOUNTER — Ambulatory Visit
Admission: RE | Admit: 2017-05-03 | Discharge: 2017-05-03 | Disposition: A | Discharge status: Home / Self Care | Payer: Medicare Other | Source: Ambulatory Visit | Attending: Neurology | Admitting: Neurology

## 2017-05-03 DIAGNOSIS — G992 Myelopathy in diseases classified elsewhere: Secondary | ICD-10-CM

## 2017-05-03 DIAGNOSIS — G4459 Other complicated headache syndrome: Secondary | ICD-10-CM

## 2017-05-03 DIAGNOSIS — R200 Anesthesia of skin: Secondary | ICD-10-CM | POA: Diagnosis not present

## 2017-05-03 DIAGNOSIS — M5023 Other cervical disc displacement, cervicothoracic region: Secondary | ICD-10-CM | POA: Diagnosis not present

## 2017-05-26 DIAGNOSIS — E649 Sequelae of unspecified nutritional deficiency: Secondary | ICD-10-CM | POA: Diagnosis not present

## 2017-05-26 DIAGNOSIS — M545 Low back pain: Secondary | ICD-10-CM | POA: Diagnosis not present

## 2017-05-26 DIAGNOSIS — G4489 Other headache syndrome: Secondary | ICD-10-CM | POA: Diagnosis not present

## 2017-05-26 DIAGNOSIS — G603 Idiopathic progressive neuropathy: Secondary | ICD-10-CM | POA: Diagnosis not present

## 2017-06-16 ENCOUNTER — Ambulatory Visit: Payer: Medicare Other | Admitting: Neurology

## 2017-06-17 ENCOUNTER — Encounter: Payer: Self-pay | Admitting: Neurology

## 2017-06-26 DIAGNOSIS — G603 Idiopathic progressive neuropathy: Secondary | ICD-10-CM | POA: Diagnosis not present

## 2017-06-26 DIAGNOSIS — M4134 Thoracogenic scoliosis, thoracic region: Secondary | ICD-10-CM | POA: Diagnosis not present

## 2017-06-26 DIAGNOSIS — F4001 Agoraphobia with panic disorder: Secondary | ICD-10-CM | POA: Diagnosis not present

## 2017-06-26 DIAGNOSIS — E649 Sequelae of unspecified nutritional deficiency: Secondary | ICD-10-CM | POA: Diagnosis not present

## 2017-07-15 DIAGNOSIS — M069 Rheumatoid arthritis, unspecified: Secondary | ICD-10-CM | POA: Diagnosis not present

## 2017-07-15 DIAGNOSIS — M329 Systemic lupus erythematosus, unspecified: Secondary | ICD-10-CM | POA: Diagnosis not present

## 2017-07-15 DIAGNOSIS — J301 Allergic rhinitis due to pollen: Secondary | ICD-10-CM | POA: Diagnosis not present

## 2017-07-15 DIAGNOSIS — I10 Essential (primary) hypertension: Secondary | ICD-10-CM | POA: Diagnosis not present

## 2017-07-15 DIAGNOSIS — R000 Tachycardia, unspecified: Secondary | ICD-10-CM | POA: Diagnosis not present

## 2017-07-15 DIAGNOSIS — F5089 Other specified eating disorder: Secondary | ICD-10-CM | POA: Diagnosis not present

## 2017-07-15 DIAGNOSIS — J9611 Chronic respiratory failure with hypoxia: Secondary | ICD-10-CM | POA: Diagnosis not present

## 2017-07-15 DIAGNOSIS — E876 Hypokalemia: Secondary | ICD-10-CM | POA: Diagnosis not present

## 2017-07-15 DIAGNOSIS — M792 Neuralgia and neuritis, unspecified: Secondary | ICD-10-CM | POA: Diagnosis not present

## 2017-07-15 DIAGNOSIS — J4550 Severe persistent asthma, uncomplicated: Secondary | ICD-10-CM | POA: Diagnosis not present

## 2017-07-15 DIAGNOSIS — E559 Vitamin D deficiency, unspecified: Secondary | ICD-10-CM | POA: Diagnosis not present

## 2017-07-17 DIAGNOSIS — R5383 Other fatigue: Secondary | ICD-10-CM | POA: Diagnosis not present

## 2017-07-17 DIAGNOSIS — E7800 Pure hypercholesterolemia, unspecified: Secondary | ICD-10-CM | POA: Diagnosis not present

## 2017-08-07 ENCOUNTER — Telehealth: Payer: Self-pay | Admitting: Cardiology

## 2017-08-07 NOTE — Telephone Encounter (Signed)
Pt's mom calling.   Pt c/o BP issue: STAT if pt c/o blurred vision, one-sided weakness or slurred speech  1. What are your last 5 BP readings? 5.30.19   94/81  Hr is 75  2. Are you having any other symptoms (ex. Dizziness, headache, blurred vision, passed out)? lightheaded  3. What is your BP issue? Low BP

## 2017-08-07 NOTE — Telephone Encounter (Signed)
Spoke with Marijo - caretaker.  Pt has had a lower BP today 94/81 with a  HR of 85.  They report she is well hydrated and peeing clear urine.  Report pulse is very weak to the point they can't feel it and have to listen to her heart with a stethoscope to get her HR.  She has not taken propranolol today.  She is wanting to speak with Jory Sims, DNP. Specifically.  Advised I will forward this information to her to c/b.

## 2017-08-08 ENCOUNTER — Other Ambulatory Visit: Payer: Self-pay

## 2017-08-08 ENCOUNTER — Telehealth: Payer: Self-pay | Admitting: Cardiology

## 2017-08-08 NOTE — Telephone Encounter (Signed)
New Message:      Pt c/o BP issue: STAT if pt c/o blurred vision, one-sided weakness or slurred speech  1. What are your last 5 BP readings? 95/78  2. Are you having any other symptoms (ex. Dizziness, headache, blurred vision, passed out)?   3. What is your BP issue? Pt is continuing to have a low bp as well as a low pulse.

## 2017-08-08 NOTE — Telephone Encounter (Signed)
Spoke with patient's POA (Marijo) and they complied to decision.    They were asking if it came liquid form, but I checked with pharmacy, they said tablet only availability.  When I went to order, a warning appeared because she has allergy to corn-containing products.  Please advise, thank you

## 2017-08-08 NOTE — Telephone Encounter (Signed)
Follow up:      Pt calling back from call on yesterday about BP. Pt also sees lawrence in [place] office

## 2017-08-08 NOTE — Telephone Encounter (Signed)
Patient's POA Kyra Manges) is calling again (called at 3:30 pm on 5/30) because she has not heard anything in regards to yesterday's call pertaining to patient's BP and HR.    Patient has been holding her Propranolol if her BP is low prior to medication.  Lately, her BP has been running at 94/51 and HR ranging 75-95.  Her pulse is very faint and at times hard to palpate.  She is also lightheaded and seems to be falling to sleep frequently.  The patient can usually feel her heart pounding, but now at times, cannot feel at all.  I will forward to Dr Joneen Roach patient does not see him anymore (their choice) because he is listed as her primary cardiologist, and  because [facility] is out of the office today and then on vacation next week.  They would like to here from someone.  She does have OV in mid-June with [facility] Nails.  Please advise, thank you

## 2017-08-08 NOTE — Telephone Encounter (Signed)
Let's try midodrine 5mg  PO TID with meals to raise BP.  Liberalize salt and fluids  Candee Furbish, MD

## 2017-08-10 NOTE — Telephone Encounter (Signed)
Salt, liquids.  Can not take pill.  Candee Furbish, MD

## 2017-08-11 ENCOUNTER — Other Ambulatory Visit: Payer: Self-pay

## 2017-08-11 NOTE — Telephone Encounter (Signed)
Spoke with Idalia Needle [facility]) for patient and gave her Dr Kingsley Plan recommendation.  She verbalized understanding.

## 2017-08-21 ENCOUNTER — Ambulatory Visit: Payer: Medicare Other | Admitting: Adult Health

## 2017-08-23 ENCOUNTER — Emergency Department (HOSPITAL_COMMUNITY)
Admission: EM | Admit: 2017-08-23 | Discharge: 2017-08-23 | Disposition: A | Discharge status: Home / Self Care | Payer: Medicare Other | Attending: Emergency Medicine | Admitting: Emergency Medicine

## 2017-08-23 ENCOUNTER — Other Ambulatory Visit: Payer: Self-pay

## 2017-08-23 ENCOUNTER — Encounter (HOSPITAL_COMMUNITY): Payer: Self-pay

## 2017-08-23 ENCOUNTER — Emergency Department (HOSPITAL_COMMUNITY): Payer: Medicare Other

## 2017-08-23 DIAGNOSIS — R000 Tachycardia, unspecified: Secondary | ICD-10-CM | POA: Diagnosis not present

## 2017-08-23 DIAGNOSIS — Z87891 Personal history of nicotine dependence: Secondary | ICD-10-CM | POA: Diagnosis not present

## 2017-08-23 DIAGNOSIS — Z79899 Other long term (current) drug therapy: Secondary | ICD-10-CM | POA: Diagnosis not present

## 2017-08-23 LAB — BASIC METABOLIC PANEL
Anion gap: 10 (ref 5–15)
BUN: 11 mg/dL (ref 6–20)
CO2: 23 mmol/L (ref 22–32)
Calcium: 9.2 mg/dL (ref 8.9–10.3)
Chloride: 102 mmol/L (ref 101–111)
Creatinine, Ser: 0.63 mg/dL (ref 0.44–1.00)
GFR calc Af Amer: >60 mL/min (ref >60)
GFR calc non Af Amer: >60 mL/min (ref >60)
Glucose, Bld: 104 mg/dL — ABNORMAL HIGH (ref 65–99)
Potassium: 3.6 mmol/L (ref 3.5–5.1)
Sodium: 135 mmol/L (ref 135–145)

## 2017-08-23 LAB — MAGNESIUM: Magnesium: 2 mg/dL (ref 1.7–2.4)

## 2017-08-23 LAB — CBC WITH DIFFERENTIAL/PLATELET
Basophils Absolute: 0 10*3/uL (ref 0.0–0.1)
Basophils Relative: 0 %
Eosinophils Absolute: 0 10*3/uL (ref 0.0–0.7)
Eosinophils Relative: 0 %
HCT: 44 % (ref 36.0–46.0)
Hemoglobin: 14.8 g/dL (ref 12.0–15.0)
Lymphocytes Relative: 14 %
Lymphs Abs: 1.2 10*3/uL (ref 0.7–4.0)
MCH: 30.1 pg (ref 26.0–34.0)
MCHC: 33.6 g/dL (ref 30.0–36.0)
MCV: 89.4 fL (ref 78.0–100.0)
Monocytes Absolute: 0.5 10*3/uL (ref 0.1–1.0)
Monocytes Relative: 6 %
Neutro Abs: 7.2 10*3/uL (ref 1.7–7.7)
Neutrophils Relative %: 80 %
Platelets: 262 10*3/uL (ref 150–400)
RBC: 4.92 MIL/uL (ref 3.87–5.11)
RDW: 11.5 % (ref 11.5–15.5)
WBC: 9 10*3/uL (ref 4.0–10.5)

## 2017-08-23 NOTE — Discharge Instructions (Signed)
Avoid avoid caffinated products, such as teas, colas, coffee, chocolate. Avoid over the counter cold medicines, herbal or "natural vitamin" products, and illicit drugs because they can contain stimulants.  Call your regular medical doctor and your Cardiologist on Monday to schedule a follow up appointment this week.  Return to the Emergency Department immediately if worsening.

## 2017-08-23 NOTE — ED Provider Notes (Signed)
[facility] EMERGENCY DEPARTMENT Provider Note   CSN: [id number] Arrival date & time: 08/23/17  1647     History   Chief Complaint Chief Complaint  Patient presents with  . Tachycardia    HPI Traci Hensley is a 27 y.o. female.  HPI  Pt was seen at 1740. Per pt, c/o sudden onset and resolution of multiple intermittent episodes of "high heart rate" for the past several weeks. Pt states her heart rate will episodically increase to "130 then drop to 50." Pt states her BP "also drops to 90's." Pt endorses hx of tachycardia and multiple medication allergies which has been limiting control of her symptoms. Pt has not been taking her propanolol for the past several weeks. Pt also has not f/u with her Cardiologist. Pt states she came to the ED today "to check her labs." Denies CP/SOB, no cough, no abd pain, no N/V/D, no fevers, no rash.   Past Medical History:  Diagnosis Date  . Agoraphobia   . Anxiety   . Bipolar 1 disorder ([place])   . Chronic abdominal pain   . Chronic chest pain   . Depression   . PTSD (post-traumatic stress disorder)   . Scoliosis     Patient Active Problem List   Diagnosis Date Noted  . Ingrown toenail 11/23/2015  . Dyspepsia 08/31/2014  . Abdominal pain 06/29/2014  . Achilles tendinitis of both lower extremities 01/23/2014  . Palpitations 04/19/2013  . Chest pain 04/19/2013  . Sinus tachycardia 04/19/2013  . Anxiety 04/19/2013    Past Surgical History:  Procedure Laterality Date  . MOUTH SURGERY       OB History   None      Home Medications    Prior to Admission medications   Medication Sig Start Date End Date Taking? Authorizing Provider  ADVAIR DISKUS 500-50 MCG/DOSE AEPB Inhale 1 puff into the lungs 2 times a day as needed dor wheezing 07/25/14  Yes [provider]  baclofen (LIORESAL) 10 MG tablet Take 10 mg by mouth 2 (two) times daily as needed. 08/09/17  Yes [provider]  D3-50 50000 units capsule Take 1 tablet by  mouth once a week. 06/06/17  Yes [provider]  diclofenac sodium (VOLTAREN) 1 % GEL APPLY 2 3 GRAMS THREE TIMES DAILY AS NEEDED 08/09/17  Yes [provider]  ESTARYLLA 0.25-35 MG-MCG tablet Take 1 tablet by mouth daily.  10/07/16  Yes [provider]  gabapentin (NEURONTIN) 300 MG capsule Take 300 mg by mouth 3 (three) times daily as needed (pain).  06/03/16  Yes [provider]  hydrOXYzine (ATARAX/VISTARIL) 25 MG tablet  07/19/17  Yes [provider]  levalbuterol (XOPENEX HFA) 45 MCG/ACT inhaler Inhale 2 puffs into the lungs every 8 (eight) hours as needed.  12/17/16  Yes [provider]  levalbuterol (XOPENEX) 0.31 MG/3ML nebulizer solution Take 3 mLs (0.31 mg total) by nebulization every 6 (six) hours as needed for wheezing or shortness of breath. 12/09/16  Yes Lendon Colonel, NP  levalbuterol Penne Lash) 1.25 MG/3ML nebulizer solution  06/06/17  Yes [provider]  propranolol (INDERAL) 20 MG/5ML solution Take 2.5 mLs (10 mg total) by mouth 2 (two) times daily as needed. 12/06/16  Yes Lendon Colonel, NP  RESTASIS MULTIDOSE 0.05 % ophthalmic emulsion INSTILL 1 DROP INTO EACH EYE TWICE DAILY 08/09/17  Yes [provider]  VENTOLIN HFA 108 (90 Base) MCG/ACT inhaler Take 1 puff by mouth as needed. 07/15/17  Yes [provider]  EPINEPHrine 0.3 mg/0.3 mL IJ SOAJ injection Inject 0.3 mg into the muscle once. As needed for allergic reaction    [provider]  nitroGLYCERIN (NITROSTAT) 0.4 MG SL tablet Place 1 tablet (0.4 mg total) under the tongue every 5 (five) minutes as needed for chest pain. 06/17/14   Jerline Pain, MD    Family History Family History  Problem Relation Age of Onset  . Arrhythmia Mother   . Colon cancer Neg Hx     Social History Social History   Tobacco Use  . Smoking status: Former Smoker    Packs/day: 0.01    Years: 2.00    Pack years: 0.02    Types: Cigarettes    Last attempt  to quit: 02/14/2014    Years since quitting: 3.5  . Smokeless tobacco: Never Used  . Tobacco comment: rare, social smoker  Substance Use Topics  . Alcohol use: Yes    Alcohol/week: 0.0 oz    Comment: occ  . Drug use: No     Allergies   Adhesive [tape]; Blue dyes (parenteral); Contrast media [iodinated diagnostic agents]; Corn-containing products; Green dyes; Nickel; Onion; Other; Cortisporin [bacitra-neomycin-polymyxin-hc]; Yellow jacket venom; and Petroleum distillate   Review of Systems Review of Systems ROS: Statement: All systems negative except as marked or noted in the HPI; Constitutional: Negative for fever and chills. ; ; Eyes: Negative for eye pain, redness and discharge. ; ; ENMT: Negative for ear pain, hoarseness, nasal congestion, sinus pressure and sore throat. ; ; Cardiovascular: +palpitations. Negative for chest pain, diaphoresis, dyspnea and peripheral edema. ; ; Respiratory: Negative for cough, wheezing and stridor. ; ; Gastrointestinal: Negative for nausea, vomiting, diarrhea, abdominal pain, blood in stool, hematemesis, jaundice and rectal bleeding. . ; ; Genitourinary: Negative for dysuria, flank pain and hematuria. ; ; Musculoskeletal: Negative for back pain and neck pain. Negative for swelling and trauma.; ; Skin: Negative for pruritus, rash, abrasions, blisters, bruising and skin lesion.; ; Neuro: Negative for headache, lightheadedness and neck stiffness. Negative for weakness, altered level of consciousness, altered mental status, extremity weakness, paresthesias, involuntary movement, seizure and syncope.       Physical Exam Updated Vital Signs BP 115/76   Pulse (!) 103   Temp 98.6 F (37 C) (Oral)   Resp 14   Ht 5\' 1"  (1.549 m)   Wt 52.3 kg (115 lb 6 oz)   LMP 08/10/2017   SpO2 99%   BMI 21.80 kg/m    Patient Vitals for the past 24 hrs:  BP Temp Temp src Pulse Resp SpO2 Height Weight  08/23/17 1830 115/76 - - (!) 103 14 99 % - -  08/23/17 1800 (!)  135/98 - - (!) 109 17 100 % - -  08/23/17 1730 117/79 - - (!) 105 16 100 % - -  08/23/17 1718 130/79 - - (!) 110 (!) 26 100 % - -  08/23/17 1652 - - - - - - 5\' 1"  (1.549 m) 52.3 kg (115 lb 6 oz)  08/23/17 1651 (!) 146/86 98.6 F (37 C) Oral (!) 150 20 100 % - -     Physical Exam 1745: Physical examination:  Nursing notes reviewed; Vital signs and O2 SAT reviewed;  Constitutional: Well developed, Well nourished, Well hydrated, In no acute distress; Head:  Normocephalic, atraumatic; Eyes: EOMI, PERRL, No scleral icterus; ENMT: Mouth and pharynx normal, Mucous membranes moist; Neck: Supple, Full range of motion, No lymphadenopathy; Cardiovascular: Regular rate and rhythm, No gallop.  HR 90's at the start of my exam and then increased to 120's, then back to 100's during exam.; Respiratory: Breath sounds clear & equal bilaterally, No wheezes.  Speaking full sentences with ease, Normal respiratory effort/excursion; Chest: Nontender, Movement normal; Abdomen: Soft, Nontender, Nondistended, Normal bowel sounds; Genitourinary: No CVA tenderness; Extremities: Peripheral pulses normal, No tenderness, No edema, No calf edema or asymmetry.; Neuro: AA&Ox3, Major CN grossly intact.  Speech clear. No gross focal motor or sensory deficits in extremities.; Skin: Color normal, Warm, Dry.; Psych:  Anxious.    ED Treatments / Results  Labs (all labs ordered are listed, but only abnormal results are displayed)   EKG EKG Interpretation  Date/Time:  Saturday August 23 2017 17:29:12 EDT Ventricular Rate:  105 PR Interval:    QRS Duration: 89 QT Interval:  330 QTC Calculation: 437 R Axis:   92 Text Interpretation:  Sinus tachycardia Borderline right axis deviation Borderline T wave abnormalities When compared with ECG of 12/03/2016 Rate slower Confirmed by Francine Graven [phone]) on 08/23/2017 7:18:11 PM   Radiology   Procedures Procedures (including critical care time)  Medications Ordered in  ED Medications - No data to display   Initial Impression / Assessment and Plan / ED Course  I have reviewed the triage vital signs and the nursing notes.  Pertinent labs & imaging results that were available during my care of the patient were reviewed by me and considered in my medical decision making (see chart for details).  MDM Reviewed: previous chart, nursing note and vitals Reviewed previous: labs and ECG Interpretation: labs, ECG and x-ray   Results for orders placed or performed during the hospital encounter of 13/24/40  Basic metabolic panel  Result Value Ref Range   Sodium 135 135 - 145 mmol/L   Potassium 3.6 3.5 - 5.1 mmol/L   Chloride 102 101 - 111 mmol/L   CO2 23 22 - 32 mmol/L   Glucose, Bld 104 (H) 65 - 99 mg/dL   BUN 11 6 - 20 mg/dL   Creatinine, Ser 0.63 0.44 - 1.00 mg/dL   Calcium 9.2 8.9 - 10.3 mg/dL   GFR calc non Af Amer >60 >60 mL/min   GFR calc Af Amer >60 >60 mL/min   Anion gap 10 5 - 15  CBC with Differential  Result Value Ref Range   WBC 9.0 4.0 - 10.5 K/uL   RBC 4.92 3.87 - 5.11 MIL/uL   Hemoglobin 14.8 12.0 - 15.0 g/dL   HCT 44.0 36.0 - 46.0 %   MCV 89.4 78.0 - 100.0 fL   MCH 30.1 26.0 - 34.0 pg   MCHC 33.6 30.0 - 36.0 g/dL   RDW 11.5 11.5 - 15.5 %   Platelets 262 150 - 400 K/uL   Neutrophils Relative % 80 %   Neutro Abs 7.2 1.7 - 7.7 K/uL   Lymphocytes Relative 14 %   Lymphs Abs 1.2 0.7 - 4.0 K/uL   Monocytes Relative 6 %   Monocytes Absolute 0.5 0.1 - 1.0 K/uL   Eosinophils Relative 0 %   Eosinophils Absolute 0.0 0.0 - 0.7 K/uL   Basophils Relative 0 %   Basophils Absolute 0.0 0.0 - 0.1 K/uL  Magnesium  Result Value Ref Range   Magnesium 2.0 1.7 - 2.4 mg/dL   Dg Chest 2 View Result Date: 08/23/2017 CLINICAL DATA:  Tachycardia. EXAM: CHEST - 2 VIEW COMPARISON:  Chest x-ray dated 02/23/2014. FINDINGS: Heart size and mediastinal contours are within normal limits. Lungs are clear.  No pleural effusion or pneumothorax seen. No acute or  suspicious osseous finding. IMPRESSION: No active cardiopulmonary disease. No evidence of pneumonia or pulmonary edema. Electronically Signed   By: Franki Cabot M.D.   On: 08/23/2017 19:11    1945:  Pt does not want to give urine sample, states she "can do that at my doctor's." Denies any urinary symptoms. Workup reassuring. States she wants to go home now. Dx and testing d/w pt.  Questions answered.  Verb understanding, agreeable to d/c home with outpt f/u.   Final Clinical Impressions(s) / ED Diagnoses   Final diagnoses:  None    ED Discharge Orders    None       Francine Graven, DO 08/28/17 1549

## 2017-08-23 NOTE — ED Notes (Signed)
EKG given to Dr. McManus.  
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
## 2017-08-23 NOTE — ED Notes (Signed)
Pt stated that she wanted a copy of her labs and a disc of her xray for her provider who is not in network.

## 2017-08-26 DIAGNOSIS — E649 Sequelae of unspecified nutritional deficiency: Secondary | ICD-10-CM | POA: Diagnosis not present

## 2017-08-26 DIAGNOSIS — F4001 Agoraphobia with panic disorder: Secondary | ICD-10-CM | POA: Diagnosis not present

## 2017-08-26 DIAGNOSIS — G603 Idiopathic progressive neuropathy: Secondary | ICD-10-CM | POA: Diagnosis not present

## 2017-08-26 DIAGNOSIS — M4134 Thoracogenic scoliosis, thoracic region: Secondary | ICD-10-CM | POA: Diagnosis not present

## 2017-08-27 DIAGNOSIS — F419 Anxiety disorder, unspecified: Secondary | ICD-10-CM | POA: Diagnosis not present

## 2017-08-27 DIAGNOSIS — J9611 Chronic respiratory failure with hypoxia: Secondary | ICD-10-CM | POA: Diagnosis not present

## 2017-08-27 DIAGNOSIS — I471 Supraventricular tachycardia: Secondary | ICD-10-CM | POA: Diagnosis not present

## 2017-08-27 DIAGNOSIS — I10 Essential (primary) hypertension: Secondary | ICD-10-CM | POA: Diagnosis not present

## 2017-08-27 DIAGNOSIS — R000 Tachycardia, unspecified: Secondary | ICD-10-CM | POA: Diagnosis not present

## 2017-08-27 DIAGNOSIS — J4550 Severe persistent asthma, uncomplicated: Secondary | ICD-10-CM | POA: Diagnosis not present

## 2017-08-27 DIAGNOSIS — F339 Major depressive disorder, recurrent, unspecified: Secondary | ICD-10-CM | POA: Diagnosis not present

## 2017-08-28 ENCOUNTER — Other Ambulatory Visit (HOSPITAL_COMMUNITY): Payer: Self-pay | Admitting: Respiratory Therapy

## 2017-08-28 DIAGNOSIS — J9611 Chronic respiratory failure with hypoxia: Secondary | ICD-10-CM

## 2017-08-29 ENCOUNTER — Other Ambulatory Visit: Payer: Self-pay | Admitting: Neurology

## 2017-08-29 ENCOUNTER — Ambulatory Visit (INDEPENDENT_AMBULATORY_CARE_PROVIDER_SITE_OTHER): Payer: Medicare Other | Admitting: Cardiovascular Disease

## 2017-08-29 ENCOUNTER — Encounter: Payer: Self-pay | Admitting: Cardiovascular Disease

## 2017-08-29 VITALS — BP 106/80 | HR 74 | Ht 63.0 in | Wt 116.2 lb

## 2017-08-29 DIAGNOSIS — R000 Tachycardia, unspecified: Secondary | ICD-10-CM | POA: Diagnosis not present

## 2017-08-29 DIAGNOSIS — M5416 Radiculopathy, lumbar region: Secondary | ICD-10-CM

## 2017-08-29 DIAGNOSIS — R32 Unspecified urinary incontinence: Secondary | ICD-10-CM

## 2017-08-29 NOTE — Patient Instructions (Signed)
Medication Instructions:  Your physician recommends that you continue on your current medications as directed. Please refer to the Current Medication list given to you today.   Labwork: none  Testing/Procedures: none  Follow-Up: Your physician recommends that you schedule a follow-up appointment in: as needed   You have been referred to cardiac EP     Any Other Special Instructions Will Be Listed Below (If Applicable).     If you need a refill on your cardiac medications before your next appointment, please call your pharmacy.

## 2017-08-29 NOTE — Progress Notes (Signed)
SUBJECTIVE: The patient presents to establish care in our [place] office.  She saw Dr. Marlou Porch in March 2018.  She reportedly has a history of PTSD, anxiety, and multiple ER visits for atypical chest pain with prior unremarkable work-ups.  She has been tried on Imdur and propranolol in the past.  At that time she was prescribed propranolol when needed.  I reviewed an echocardiogram performed in October 2018 which demonstrated normal left ventricular systolic and diastolic function and normal regional wall motion.  She saw K.  West Pugh in October 2018.  It was noted that she has refused cardiac monitors and a loop recorder for the evaluation of tachycardia in the past.  She was then evaluated in the ED at [facility] on 08/23/2017.  I reviewed all relevant documentation, labs, and studies.  She told the ED physician that her heart rate will increase to 130 bpm and then dropped to 50 bpm with systolic blood pressure also dropping to the 90 range.  I reviewed relevant labs: Sodium 135, potassium 3.6, BUN 11, creatinine 0.63, white blood cells 9, hemoglobin 14.8, platelets 262, magnesium 2.  Chest x-ray showed no active cardia pulmonary disease.  It was requested she give a urine sample but she refused.  I personally reviewed the ECG which demonstrated sinus tachycardia, 105 bpm.  She is here with her mother.  She brought in several ECGs and Holter monitors dating back several years which I reviewed demonstrating sinus tachycardia.   She said her heart rates fluctuate from 161-200 bpm but dropped down to the 45-55 beat minute range whenever she rests.  She has a pulse ox at home.  She said her blood pressure fluctuates as well.  She has a significant nickel allergy as well as adhesive allergies and allergies to corn and blue and green dyes.    Review of Systems: As per "subjective", otherwise negative.  Allergies  Allergen Reactions  . Adhesive [Tape] Other (See  Comments)    Eats skin almost instantly  . Blue Dyes (Parenteral) Other (See Comments)    BLUE DYES IS OK BY ITSELF, BUT CANNOT BE COMBINED WITH GREEN DYES  . Contrast Media [Iodinated Diagnostic Agents] Nausea And Vomiting    Vomiting up blood, shaking and tremors uncontrollable, dizziness and lightheadness  . Corn-Containing Products Anaphylaxis and Nausea And Vomiting    ALSO GRASS FAMILY PLANTS, VEGETABLES, ETC.   . Green Dyes Other (See Comments)    GREEN DYE IS OK BY ITSELF, BUT CANNOT BE COMBINED WITH BLUE DYE  . Nickel Itching, Swelling, Rash and Other (See Comments)  . Onion     Cannot be around onion at all or she has breathing difficulty  . Other Nausea Only, Rash and Other (See Comments)    Sunlight causes extreme fatigue , Migraine , Cat Dander  . Cortisporin [Bacitra-Neomycin-Polymyxin-Hc]     Otic 1% suspension- causes hearing loss and pain.  . Yellow Jacket Venom Hives    Severe disorientation  . Petroleum Distillate Itching, Swelling and Rash    Current Outpatient Medications  Medication Sig Dispense Refill  . ADVAIR DISKUS 500-50 MCG/DOSE AEPB Inhale 1 puff into the lungs 2 times a day as needed dor wheezing  12  . baclofen (LIORESAL) 10 MG tablet Take 10 mg by mouth 2 (two) times daily as needed.  1  . D3-50 50000 units capsule Take 1 tablet by mouth once a week.  3  . diclofenac sodium (VOLTAREN) 1 %  GEL APPLY 2 3 GRAMS THREE TIMES DAILY AS NEEDED  5  . EPINEPHrine 0.3 mg/0.3 mL IJ SOAJ injection Inject 0.3 mg into the muscle once. As needed for allergic reaction    . ESTARYLLA 0.25-35 MG-MCG tablet Take 1 tablet by mouth daily.     Marland Kitchen gabapentin (NEURONTIN) 300 MG capsule Take 300 mg by mouth 3 (three) times daily as needed (pain).   2  . hydrOXYzine (ATARAX/VISTARIL) 25 MG tablet     . levalbuterol (XOPENEX HFA) 45 MCG/ACT inhaler Inhale 2 puffs into the lungs every 8 (eight) hours as needed.     . levalbuterol (XOPENEX) 1.25 MG/3ML nebulizer solution   12  .  nitroGLYCERIN (NITROSTAT) 0.4 MG SL tablet Place 1 tablet (0.4 mg total) under the tongue every 5 (five) minutes as needed for chest pain. 25 tablet 12  . propranolol (INDERAL) 20 MG/5ML solution Take 2.5 mLs (10 mg total) by mouth 2 (two) times daily as needed. 200 mL 3  . RESTASIS MULTIDOSE 0.05 % ophthalmic emulsion INSTILL 1 DROP INTO EACH EYE TWICE DAILY  3  . VENTOLIN HFA 108 (90 Base) MCG/ACT inhaler Take 1 puff by mouth as needed.  12   No current facility-administered medications for this visit.     Past Medical History:  Diagnosis Date  . Agoraphobia   . Anxiety   . Bipolar 1 disorder ([place])   . Chronic abdominal pain   . Chronic chest pain   . Depression   . PTSD (post-traumatic stress disorder)   . Scoliosis     Past Surgical History:  Procedure Laterality Date  . MOUTH SURGERY      Social History   Socioeconomic History  . Marital status: Single    Spouse name: Not on file  . Number of children: Not on file  . Years of education: Not on file  . Highest education level: Not on file  Occupational History  . Not on file  Social Needs  . Financial resource strain: Not on file  . Food insecurity:    Worry: Not on file    Inability: Not on file  . Transportation needs:    Medical: Not on file    Non-medical: Not on file  Tobacco Use  . Smoking status: Former Smoker    Packs/day: 0.01    Years: 2.00    Pack years: 0.02    Types: Cigarettes    Last attempt to quit: 02/14/2014    Years since quitting: 3.5  . Smokeless tobacco: Never Used  . Tobacco comment: rare, social smoker  Substance and Sexual Activity  . Alcohol use: Yes    Alcohol/week: 0.0 oz    Comment: occ  . Drug use: No  . Sexual activity: Not on file  Lifestyle  . Physical activity:    Days per week: Not on file    Minutes per session: Not on file  . Stress: Not on file  Relationships  . Social connections:    Talks on phone: Not on file    Gets together: Not on file    Attends  religious service: Not on file    Active member of club or organization: Not on file    Attends meetings of clubs or organizations: Not on file    Relationship status: Not on file  . Intimate partner violence:    Fear of current or ex partner: Not on file    Emotionally abused: Not on file    Physically abused:  Not on file    Forced sexual activity: Not on file  Other Topics Concern  . Not on file  Social History Narrative  . Not on file     Vitals:   08/29/17 1345  BP: 106/80  Pulse: 74  SpO2: 99%  Weight: 116 lb 3.2 oz (52.7 kg)  Height: 5\' 3"  (1.6 m)    Wt Readings from Last 3 Encounters:  08/29/17 116 lb 3.2 oz (52.7 kg)  08/23/17 115 lb 6 oz (52.3 kg)  01/06/17 118 lb (53.5 kg)     PHYSICAL EXAM General: NAD HEENT: Normal. Neck: No JVD, no thyromegaly. Lungs: Clear to auscultation bilaterally with normal respiratory effort. CV: Regular rate and rhythm, normal S1/S2, no S3/S4, no murmur. No pretibial or periankle edema.  No carotid bruit.   Abdomen: Soft, nontender, no distention.  Neurologic: Alert and oriented.  Psych: Normal affect. Skin: Normal. Musculoskeletal: No gross deformities.    ECG: Most recent ECG reviewed.   Labs: Lab Results  Component Value Date/Time   K 3.6 08/23/2017 05:58 PM   K 3.8 10/26/2012 01:12 AM   BUN 11 08/23/2017 05:58 PM   BUN 12 10/26/2012 01:12 AM   CREATININE 0.63 08/23/2017 05:58 PM   CREATININE 0.74 10/26/2012 01:12 AM   ALT 48 12/03/2016 03:07 PM   ALT 14 10/26/2012 01:12 AM   TSH 1.23 12/09/2016 11:23 AM   HGB 14.8 08/23/2017 05:58 PM   HGB 13.4 10/26/2012 01:12 AM     Lipids: No results found for: LDLCALC, LDLDIRECT, CHOL, TRIG, HDL     ASSESSMENT AND PLAN: 1.  Tachycardia: She has not had any recent cardiac monitoring and she is worried about attempting a loop recorder to the possibility of an allergy.  She also cannot use a Holter or event monitor due to adhesive allergies.  She was told in the past that  she may need to speak with EP.  She has not used the liquid propranolol for at least the past 3 weeks.  I will make a referral.  I would consider the use of possibly ivabradine for inappropriate sinus tachycardia but the patient is concerned about a possible drug allergy which is a reasonable concern given her multiple allergies.    Disposition: Follow up with EP.  Follow-up with me as needed.  Time spent: 40 minutes, of which greater than 50% was spent reviewing symptoms, relevant blood tests and studies, and discussing management plan with the patient.    Kate Sable, M.D., F.A.C.C.

## 2017-08-30 ENCOUNTER — Ambulatory Visit
Admission: RE | Admit: 2017-08-30 | Discharge: 2017-08-30 | Disposition: A | Discharge status: Home / Self Care | Payer: Medicare Other | Source: Ambulatory Visit | Attending: Neurology | Admitting: Neurology

## 2017-08-30 DIAGNOSIS — R32 Unspecified urinary incontinence: Secondary | ICD-10-CM

## 2017-08-30 DIAGNOSIS — M5416 Radiculopathy, lumbar region: Secondary | ICD-10-CM

## 2017-08-30 DIAGNOSIS — M545 Low back pain: Secondary | ICD-10-CM | POA: Diagnosis not present

## 2017-09-02 ENCOUNTER — Ambulatory Visit (HOSPITAL_COMMUNITY)
Admission: RE | Admit: 2017-09-02 | Discharge: 2017-09-02 | Disposition: A | Discharge status: Home / Self Care | Payer: Medicare Other | Source: Ambulatory Visit | Attending: Pulmonary Disease | Admitting: Pulmonary Disease

## 2017-09-02 DIAGNOSIS — J9611 Chronic respiratory failure with hypoxia: Secondary | ICD-10-CM | POA: Insufficient documentation

## 2017-09-02 LAB — BLOOD GAS, ARTERIAL
Acid-base deficit: 0.4 mmol/L (ref 0.0–2.0)
Bicarbonate: 24.3 mmol/L (ref 20.0–28.0)
Drawn by: 270161
FIO2: 21
O2 Saturation: 97.8 %
Patient temperature: 37
pCO2 arterial: 36.3 mmHg (ref 32.0–48.0)
pH, Arterial: 7.426 (ref 7.350–7.450)
pO2, Arterial: 97.9 mmHg (ref 83.0–108.0)

## 2017-09-29 DIAGNOSIS — I471 Supraventricular tachycardia: Secondary | ICD-10-CM | POA: Diagnosis not present

## 2017-09-29 DIAGNOSIS — J9611 Chronic respiratory failure with hypoxia: Secondary | ICD-10-CM | POA: Diagnosis not present

## 2017-09-29 DIAGNOSIS — J4550 Severe persistent asthma, uncomplicated: Secondary | ICD-10-CM | POA: Diagnosis not present

## 2017-09-29 DIAGNOSIS — I10 Essential (primary) hypertension: Secondary | ICD-10-CM | POA: Diagnosis not present

## 2017-09-30 ENCOUNTER — Ambulatory Visit (INDEPENDENT_AMBULATORY_CARE_PROVIDER_SITE_OTHER): Payer: Medicare Other | Admitting: Internal Medicine

## 2017-09-30 ENCOUNTER — Encounter: Payer: Self-pay | Admitting: Internal Medicine

## 2017-09-30 VITALS — BP 110/62 | HR 78 | Ht 61.0 in | Wt 113.0 lb

## 2017-09-30 DIAGNOSIS — R002 Palpitations: Secondary | ICD-10-CM
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
## 2017-09-30 NOTE — Patient Instructions (Signed)
Medication Instructions:  Your physician recommends that you continue on your current medications as directed. Please refer to the Current Medication list given to you today.   Labwork: NONE  Testing/Procedures: NONE   Follow-Up: Your physician recommends that you schedule a follow-up appointment To Be Determined    Any Other Special Instructions Will Be Listed Below (If Applicable).   Increased Salt Intake  If you need a refill on your cardiac medications before your next appointment, please call your pharmacy. Thank you for choosing [facility]!

## 2017-10-02 ENCOUNTER — Encounter: Payer: Self-pay | Admitting: Internal Medicine

## 2017-10-13 ENCOUNTER — Encounter: Payer: Self-pay | Admitting: "Endocrinology

## 2017-10-13 DIAGNOSIS — R5383 Other fatigue: Secondary | ICD-10-CM | POA: Diagnosis not present

## 2017-10-13 DIAGNOSIS — E7800 Pure hypercholesterolemia, unspecified: Secondary | ICD-10-CM | POA: Diagnosis not present

## 2017-10-13 DIAGNOSIS — E559 Vitamin D deficiency, unspecified: Secondary | ICD-10-CM | POA: Diagnosis not present

## 2017-10-16 DIAGNOSIS — E876 Hypokalemia: Secondary | ICD-10-CM | POA: Diagnosis not present

## 2017-10-16 DIAGNOSIS — F5089 Other specified eating disorder: Secondary | ICD-10-CM | POA: Diagnosis not present

## 2017-10-16 DIAGNOSIS — E559 Vitamin D deficiency, unspecified: Secondary | ICD-10-CM | POA: Diagnosis not present

## 2017-10-31 ENCOUNTER — Emergency Department (HOSPITAL_COMMUNITY): Payer: Medicare Other

## 2017-10-31 ENCOUNTER — Other Ambulatory Visit: Payer: Self-pay

## 2017-10-31 ENCOUNTER — Encounter (HOSPITAL_COMMUNITY): Payer: Self-pay | Admitting: *Deleted

## 2017-10-31 ENCOUNTER — Telehealth: Payer: Self-pay | Admitting: Nurse Practitioner

## 2017-10-31 ENCOUNTER — Emergency Department (HOSPITAL_COMMUNITY)
Admission: EM | Admit: 2017-10-31 | Discharge: 2017-10-31 | Disposition: A | Discharge status: Home / Self Care | Payer: Medicare Other | Attending: Emergency Medicine | Admitting: Emergency Medicine

## 2017-10-31 DIAGNOSIS — R404 Transient alteration of awareness: Secondary | ICD-10-CM | POA: Diagnosis not present

## 2017-10-31 DIAGNOSIS — R079 Chest pain, unspecified: Secondary | ICD-10-CM | POA: Diagnosis not present

## 2017-10-31 DIAGNOSIS — R4182 Altered mental status, unspecified: Secondary | ICD-10-CM | POA: Diagnosis not present

## 2017-10-31 DIAGNOSIS — Z79899 Other long term (current) drug therapy: Secondary | ICD-10-CM | POA: Diagnosis not present

## 2017-10-31 DIAGNOSIS — Z87891 Personal history of nicotine dependence: Secondary | ICD-10-CM | POA: Insufficient documentation

## 2017-10-31 DIAGNOSIS — R4020 Unspecified coma: Secondary | ICD-10-CM | POA: Diagnosis not present

## 2017-10-31 LAB — BASIC METABOLIC PANEL
Anion gap: 11 (ref 5–15)
BUN: 11 mg/dL (ref 6–20)
CO2: 23 mmol/L (ref 22–32)
Calcium: 9.4 mg/dL (ref 8.9–10.3)
Chloride: 105 mmol/L (ref 98–111)
Creatinine, Ser: 0.56 mg/dL (ref 0.44–1.00)
GFR calc Af Amer: >60 mL/min (ref >60)
GFR calc non Af Amer: >60 mL/min (ref >60)
Glucose, Bld: 99 mg/dL (ref 70–99)
Potassium: 3.8 mmol/L (ref 3.5–5.1)
Sodium: 139 mmol/L (ref 135–145)

## 2017-10-31 LAB — CBC WITH DIFFERENTIAL/PLATELET
Basophils Absolute: 0 10*3/uL (ref 0.0–0.1)
Basophils Relative: 0 %
Eosinophils Absolute: 0.1 10*3/uL (ref 0.0–0.7)
Eosinophils Relative: 1 %
HCT: 40.6 % (ref 36.0–46.0)
Hemoglobin: 13.7 g/dL (ref 12.0–15.0)
Lymphocytes Relative: 19 %
Lymphs Abs: 1.9 10*3/uL (ref 0.7–4.0)
MCH: 30.6 pg (ref 26.0–34.0)
MCHC: 33.7 g/dL (ref 30.0–36.0)
MCV: 90.6 fL (ref 78.0–100.0)
Monocytes Absolute: 0.5 10*3/uL (ref 0.1–1.0)
Monocytes Relative: 5 %
Neutro Abs: 7.7 10*3/uL (ref 1.7–7.7)
Neutrophils Relative %: 75 %
Platelets: 251 10*3/uL (ref 150–400)
RBC: 4.48 MIL/uL (ref 3.87–5.11)
RDW: 12.1 % (ref 11.5–15.5)
WBC: 10.3 10*3/uL (ref 4.0–10.5)

## 2017-10-31 LAB — TSH: TSH: 1.979 u[IU]/mL (ref 0.350–4.500)

## 2017-10-31 LAB — MAGNESIUM: Magnesium: 2 mg/dL (ref 1.7–2.4)

## 2017-10-31 LAB — TROPONIN I: Troponin I: <0.03 ng/mL (ref <0.03)

## 2017-10-31 NOTE — Telephone Encounter (Signed)
   Pts family member called to report that pt has been having periodic episodes of reduced LOC beginning yesterday and occurring intermittently.  HRs have been around 65, BP's 107/72, blood sugars have been in the high 80's to 100's.  HR's sometimes drop into the 30's when pt lies down (they are monitoring via what sounds like an alivecor device). Family member is adamant that she needs multiple labs including a cbc, bmet, mg, tsh, and iron studies.  I advised that given AMS with bradycardia, pt needs to be seen tonight.  Family member asked if I could order labs so that pt could avoid the ER.  I reiterated that pt should be seen tonight and that I was not willing to call in for labs when I haven't seen the pt.  I have called and spoken to the ER secretary @ [facility] to alert them that pt will be coming.  Duration of phone call ~ 15 mins.  Murray Hodgkins, NP 10/31/2017, 6:31 PM

## 2017-10-31 NOTE — ED Provider Notes (Signed)
[facility] EMERGENCY DEPARTMENT Provider Note   CSN: [id number] Arrival date & time: 10/31/17  9702     History   Chief Complaint Chief Complaint  Patient presents with  . Altered Mental Status    HPI Traci Hensley is a 27 y.o. female.  HPI   Patient presents primarily for concern of low heart rate.  She reports periods of heart rates in the 20s and 30s, usually when she has "lying still."  She is also had periods when she is sitting next to a friend and slumped over, as if she is unconscious for a few seconds.  She complains of dyspnea on exertion.  She has occasional chest pain.  She denies nausea or vomiting.  She is not currently taking oral medications.  She uses a compound cream on her spine to treat chronic pain and injuries.  She has history of pituitary tumor, but has not had endocrinology work-up.  She has been referred to a rheumatologist for arthritis pain.  She recently saw an electrophysiologist to be evaluated for bradycardia.  He encouraged her to try to get more salt intake so she could raise her blood pressure little bit, enough to take propranolol, to treat PVCs.  Patient does not feel that this is a good solution.  She denies fever, chills, productive cough, focal weakness or paresthesia.  There are no other known modifying factors.  Past Medical History:  Diagnosis Date  . Agoraphobia   . Anxiety   . Bipolar 1 disorder ([place])   . Chronic abdominal pain   . Chronic chest pain   . Depression   . PTSD (post-traumatic stress disorder)   . Scoliosis     Patient Active Problem List   Diagnosis Date Noted  . Ingrown toenail 11/23/2015  . Dyspepsia 08/31/2014  . Abdominal pain 06/29/2014  . Achilles tendinitis of both lower extremities 01/23/2014  . Palpitations 04/19/2013  . Chest pain 04/19/2013  . Sinus tachycardia 04/19/2013  . Anxiety 04/19/2013    Past Surgical History:  Procedure Laterality Date  . MOUTH SURGERY       OB History   None       Home Medications    Prior to Admission medications   Medication Sig Start Date End Date Taking? Authorizing Provider  ADVAIR DISKUS 500-50 MCG/DOSE AEPB Inhale 1 puff into the lungs 2 (two) times daily as needed (for shortness of breath).  07/25/14  Yes [provider]  diclofenac sodium (VOLTAREN) 1 % GEL Apply 4 g topically 4 (four) times daily.  08/09/17  Yes [provider]  EPINEPHrine 0.3 mg/0.3 mL IJ SOAJ injection Inject 0.3 mg into the muscle once. As needed for allergic reaction   Yes [provider]  ESTARYLLA 0.25-35 MG-MCG tablet Take 1 tablet by mouth daily.  10/07/16  Yes [provider]  gabapentin (NEURONTIN) 300 MG capsule Take 300 mg by mouth 3 (three) times daily as needed (pain).  06/03/16  Yes [provider]  hydrOXYzine (ATARAX/VISTARIL) 25 MG tablet Take 25 mg by mouth 3 (three) times daily as needed (for allergies).  07/19/17  Yes [provider]  levalbuterol (XOPENEX HFA) 45 MCG/ACT inhaler Inhale 2 puffs into the lungs every 8 (eight) hours as needed for wheezing or shortness of breath.  12/17/16  Yes [provider]  levalbuterol Penne Lash) 1.25 MG/3ML nebulizer solution Take 1.25 mg by nebulization every 8 (eight) hours as needed for wheezing or shortness of breath.  06/06/17  Yes [provider]  LYRICA 100 MG capsule Take 100 mg by mouth 3 (three) times daily as needed (for pain).  08/28/17  Yes [provider]  nitroGLYCERIN (NITROSTAT) 0.4 MG SL tablet Place 1 tablet (0.4 mg total) under the tongue every 5 (five) minutes as needed for chest pain. 06/17/14  Yes Jerline Pain, MD  NONFORMULARY OR COMPOUNDED ITEM Apply 1 application topically 4 (four) times daily. Katamine/Gabapentin/Baclofen/Lidocaine/Menthol Cream   Yes [provider]  potassium chloride 20 MEQ/15ML (10%) SOLN Take 15 mLs by mouth daily. 09/04/17  Yes [provider]  RESTASIS MULTIDOSE 0.05 % ophthalmic  emulsion Place 1 drop into both eyes 2 (two) times daily.  08/09/17  Yes [provider]  VENTOLIN HFA 108 (90 Base) MCG/ACT inhaler Take 1 puff by mouth as needed. 07/15/17  Yes [provider]  Vitamin D, Ergocalciferol, (DRISDOL) 50000 units CAPS capsule Take 50,000 Units by mouth every Friday.   Yes [provider]  baclofen (LIORESAL) 10 MG tablet Take 10 mg by mouth 2 (two) times daily as needed for muscle spasms.  08/09/17   [provider]  propranolol (INDERAL) 20 MG/5ML solution Take 2.5 mLs (10 mg total) by mouth 2 (two) times daily as needed. Patient not taking: Reported on 10/31/2017 12/06/16   Lendon Colonel, NP    Family History Family History  Problem Relation Age of Onset  . Arrhythmia Mother   . Colon cancer Neg Hx     Social History Social History   Tobacco Use  . Smoking status: Former Smoker    Packs/day: 0.01    Years: 2.00    Pack years: 0.02    Types: Cigarettes    Last attempt to quit: 02/14/2014    Years since quitting: 3.7  . Smokeless tobacco: Never Used  . Tobacco comment: rare, social smoker  Substance Use Topics  . Alcohol use: Yes    Alcohol/week: 0.0 standard drinks    Comment: occ  . Drug use: No     Allergies   Adhesive [tape]; Blue dyes (parenteral); Contrast media [iodinated diagnostic agents]; Corn-containing products; Green dyes; Nickel; Onion; Other; Petroleum distillate; Silicone; Cortisporin [bacitra-neomycin-polymyxin-hc]; and Yellow jacket venom   Review of Systems Review of Systems  All other systems reviewed and are negative.    Physical Exam Updated Vital Signs BP 113/72   Pulse 69   Temp 99.3 F (37.4 C) (Oral)   Resp 13   Ht 5\' 1"  (1.549 m)   Wt 52 kg   SpO2 98%   BMI 21.65 kg/m   Physical Exam  Constitutional: She is oriented to person, place, and time. She appears well-developed and well-nourished.  HENT:  Head: Normocephalic and atraumatic.  Eyes: Pupils are equal, round,  and reactive to light. Conjunctivae and EOM are normal.  Neck: Normal range of motion and phonation normal. Neck supple.  Cardiovascular: Normal rate and regular rhythm.  Pulmonary/Chest: Effort normal and breath sounds normal. She exhibits no tenderness.  Abdominal: Soft. She exhibits no distension. There is no tenderness. There is no guarding.  Musculoskeletal: Normal range of motion.  Normal strength arms and legs bilaterally.  Neurological: She is alert and oriented to person, place, and time. She exhibits normal muscle tone.  No dysarthria, aphasia or nystagmus.  No pronator drift.  Normal finger-to-nose and heel-to-shin bilaterally.  No ataxia.  Skin: Skin is warm and dry.  Psychiatric: She has a normal mood and affect. Her behavior is normal. Judgment and thought content  normal.  Nursing note and vitals reviewed.    ED Treatments / Results  Labs (all labs ordered are listed, but only abnormal results are displayed) Labs Reviewed  TROPONIN I  CBC WITH DIFFERENTIAL/PLATELET  BASIC METABOLIC PANEL  MAGNESIUM  TSH  IRON AND TIBC  FERRITIN    EKG EKG Interpretation  Date/Time:  Friday October 31 2017 20:21:13 EDT Ventricular Rate:  72 PR Interval:    QRS Duration: 100 QT Interval:  368 QTC Calculation: 403 R Axis:   89 Text Interpretation:  Sinus rhythm RSR' in V1 or V2, right VCD or RVH Since last tracing rate slower Confirmed by Daleen Bo [phone]) on 10/31/2017 9:57:49 PM   Radiology Dg Chest 2 View  Result Date: 10/31/2017 CLINICAL DATA:  27 y/o F; intermittent chest pain and irregular heart rate for 3 days. EXAM: CHEST - 2 VIEW COMPARISON:  08/23/2017 chest radiograph FINDINGS: Stable heart size and mediastinal contours are within normal limits. Both lungs are clear. The visualized skeletal structures are unremarkable. IMPRESSION: No acute pulmonary process identified. Electronically Signed   By: Kristine Garbe M.D.   On: 10/31/2017 20:43   Ct Head Wo  Contrast  Result Date: 10/31/2017 CLINICAL DATA:  27 y/o F; unexplained altered level of consciousness. EXAM: CT HEAD WITHOUT CONTRAST TECHNIQUE: Contiguous axial images were obtained from the base of the skull through the vertex without intravenous contrast. COMPARISON:  05/03/2017 MRI of the head. FINDINGS: Brain: No evidence of acute infarction, hemorrhage, hydrocephalus, extra-axial collection or mass lesion/mass effect. Stable prominent pituitary gland measuring 8 mm craniocaudal with superior convex margin, size within normal limits for age. Vascular: No hyperdense vessel or unexpected calcification. Skull: Normal. Negative for fracture or focal lesion. Sinuses/Orbits: No acute finding. Other: None. IMPRESSION: No acute intracranial abnormality. Stable prominent pituitary gland given differences in technique. Electronically Signed   By: Kristine Garbe M.D.   On: 10/31/2017 21:25    Procedures Procedures (including critical care time)  Medications Ordered in ED Medications - No data to display   Initial Impression / Assessment and Plan / ED Course  I have reviewed the triage vital signs and the nursing notes.  Pertinent labs & imaging results that were available during my care of the patient were reviewed by me and considered in my medical decision making (see chart for details).  Clinical Course as of Nov 01 1128  Fri Oct 31, 2017  2230 No acute abnormality, images reviewed by me.  CT Head Wo Contrast [EW]  2230 No infiltrate or CHF, images reviewed by me  DG Chest 2 View [EW]  Sat Nov 01, 2017  1129 Normal  TSH [EW]  1129 Normal  Basic metabolic panel [EW]  9381 Normal  Magnesium [EW]  1129 Normal  Troponin I [EW]  0175 Normal  CBC with Differential [EW]    Clinical Course User Index [EW] Daleen Bo, MD     Patient Vitals for the past 24 hrs:  BP Temp Temp src Pulse Resp SpO2 Height Weight  10/31/17 2230 113/72 - - 69 13 98 % - -  10/31/17 2200 114/66 -  - 70 19 98 % - -  10/31/17 2130 114/72 - - 82 15 100 % - -  10/31/17 2100 123/78 - - 84 16 100 % - -  10/31/17 2030 119/85 - - 85 14 100 % - -  10/31/17 1925 140/77 99.3 F (37.4 C) Oral 94 16 99 % 5\' 1"  (1.549 m) 52 kg  10:45 PM Reevaluation with update and discussion. After initial assessment and treatment, an updated evaluation reveals she remains comfortable, and has no further complaints.  Findings discussed and questions answered. Daleen Bo   Medical Decision Making: Patient with vague symptoms, history of relatively chronic cardiac and pulmonary symptoms of a similar nature.  She has had recent comprehensive evaluation by her primary cardiologist and electrophysiologist.  She has been given symptomatic treatment recommendations.  There is no indication for further intervention, evaluation in ED or change in her recommended treatment program.  Doubt ACS, PE, pneumonia, metabolic instability or impending vascular collapse.  At the patient's request comprehensive laboratory testing has been sent, and initial test results are very reassuring.  Pending labs include evaluation for iron deficiency, at the request of the patient.  CRITICAL CARE-no Performed by: Daleen Bo   Nursing Notes Reviewed/ Care Coordinated Applicable Imaging Reviewed Interpretation of Laboratory Data incorporated into ED treatment  The patient appears reasonably screened and/or stabilized for discharge and I doubt any other medical condition or other [facility] requiring further screening, evaluation, or treatment in the ED at this time prior to discharge.  Plan: Home Medications-continue current medication program; Home Treatments-rest, gradually advance activity; return here if the recommended treatment, does not improve the symptoms; Recommended follow up-PCP follow-up PRN    Final Clinical Impressions(s) / ED Diagnoses   Final diagnoses:  Transient alteration of awareness    ED Discharge Orders    None        Daleen Bo, MD 11/01/17 1130

## 2017-10-31 NOTE — ED Notes (Signed)
Pt taken to xray 

## 2017-10-31 NOTE — ED Notes (Signed)
Pt family came to desk states pt c/o ha. Pt nad.

## 2017-10-31 NOTE — Discharge Instructions (Addendum)
Testing we did today, is normal.  Your iron studies are pending and should get back in a couple of days.  You can follow-up on these results, using My Chart.  Consider following up with your primary cardiologist for further evaluation of the lowered heart rate.  Return here, if needed, for problems.

## 2017-10-31 NOTE — ED Notes (Signed)
Pt returned from ct

## 2017-10-31 NOTE — ED Triage Notes (Signed)
Pt states she has been having altered loc; pt c/o right lower abdominal pain

## 2017-11-01 LAB — IRON AND TIBC
Iron: 64 ug/dL (ref 28–170)
Saturation Ratios: 16 % (ref 10.4–31.8)
TIBC: 406 ug/dL (ref 250–450)
UIBC: 342 ug/dL

## 2017-11-01 LAB — FERRITIN: Ferritin: 23 ng/mL (ref 11–307)

## 2017-11-04 ENCOUNTER — Ambulatory Visit (INDEPENDENT_AMBULATORY_CARE_PROVIDER_SITE_OTHER): Payer: Medicare Other | Admitting: Cardiovascular Disease

## 2017-11-04 ENCOUNTER — Encounter: Payer: Self-pay | Admitting: Cardiovascular Disease

## 2017-11-04 VITALS — BP 118/74 | HR 66 | Ht 61.0 in | Wt 115.0 lb

## 2017-11-04 DIAGNOSIS — R000 Tachycardia, unspecified: Secondary | ICD-10-CM

## 2017-11-04 DIAGNOSIS — R002 Palpitations: Secondary | ICD-10-CM | POA: Diagnosis not present

## 2017-11-04 NOTE — Patient Instructions (Signed)
Medication Instructions:  Continue all current medications.  Labwork: none  Testing/Procedures: none  Follow-Up: As needed.    Any Other Special Instructions Will Be Listed Below (If Applicable).  If you need a refill on your cardiac medications before your next appointment, please call your pharmacy.  

## 2017-11-04 NOTE — Addendum Note (Signed)
Addended by: Kate Sable A on: 11/04/2017 11:13 AM   Modules accepted: Level of Service

## 2017-11-04 NOTE — Progress Notes (Signed)
SUBJECTIVE: The patient presents for routine follow-up.  She was evaluated by EP on 09/30/2017 and it was suspected she had sinus tachycardia and PVCs.  It was recommended she increase her sodium intake so that she might be able to use propranolol to help improve palpitations.  Additional cardiac monitoring was not recommended.  He was also mention that she likely had autonomic dysfunction.  Because of her inability to take multiple medications, Dr. Lovena Le was not inclined to recommend either midodrine or Florinef.  She was evaluated for altered mental status on 10/31/2017 in the ED.  Troponin, CBC, basic metabolic panel, magnesium, TSH, and iron studies were all normal.  CT of the head showed no acute intracranial abnormalities.  Chest x-ray showed no acute pulmonary process.  I personally reviewed the ECG which demonstrated sinus rhythm, 72 bpm.  She has a myriad of somatic symptoms from tachycardia to palpitations, joint pain and swelling, and fatigue.  She is scheduled to see a rheumatologist and she might be seeing an endocrinologist in the near future.  They have ordered a home cardiac monitor which has 3 channels to record any potential tachycardia and/or palpitations.   Review of Systems: As per "subjective", otherwise negative.  Allergies  Allergen Reactions  . Adhesive [Tape] Other (See Comments)    Eats skin almost instantly  . Blue Dyes (Parenteral) Other (See Comments)    BLUE DYES IS OK BY ITSELF, BUT CANNOT BE COMBINED WITH GREEN DYES  . Contrast Media [Iodinated Diagnostic Agents] Nausea And Vomiting    Vomiting up blood, shaking and tremors uncontrollable, dizziness and lightheadness  . Corn-Containing Products Anaphylaxis and Nausea And Vomiting    ALSO GRASS FAMILY PLANTS, VEGETABLES, ETC.   . Green Dyes Other (See Comments)    GREEN DYE IS OK BY ITSELF, BUT CANNOT BE COMBINED WITH BLUE DYE  . Nickel Itching, Swelling, Rash and Other (See Comments)  .  Onion     Cannot be around onion at all or she has breathing difficulty  . Other Nausea Only, Rash and Other (See Comments)    Sunlight causes extreme fatigue , Migraine , Cat Dander  . Petroleum Distillate Itching, Swelling and Rash  . Silicone Rash    rash  . Cortisporin [Bacitra-Neomycin-Polymyxin-Hc]     Otic 1% suspension- causes hearing loss and pain.  . Yellow Jacket Venom Hives    Severe disorientation    Current Outpatient Medications  Medication Sig Dispense Refill  . ADVAIR DISKUS 500-50 MCG/DOSE AEPB Inhale 1 puff into the lungs 2 (two) times daily as needed (for shortness of breath).   12  . baclofen (LIORESAL) 10 MG tablet Take 10 mg by mouth 2 (two) times daily as needed for muscle spasms.   1  . diclofenac sodium (VOLTAREN) 1 % GEL Apply 4 g topically 4 (four) times daily.   5  . EPINEPHrine 0.3 mg/0.3 mL IJ SOAJ injection Inject 0.3 mg into the muscle once. As needed for allergic reaction    . ESTARYLLA 0.25-35 MG-MCG tablet Take 1 tablet by mouth daily.     Marland Kitchen gabapentin (NEURONTIN) 300 MG capsule Take 300 mg by mouth 3 (three) times daily as needed (pain).   2  . hydrOXYzine (ATARAX/VISTARIL) 25 MG tablet Take 25 mg by mouth 3 (three) times daily as needed (for allergies).     Marland Kitchen levalbuterol (XOPENEX HFA) 45 MCG/ACT inhaler Inhale 2 puffs into the lungs every 8 (eight) hours as needed for wheezing or  shortness of breath.     . levalbuterol (XOPENEX) 1.25 MG/3ML nebulizer solution Take 1.25 mg by nebulization every 8 (eight) hours as needed for wheezing or shortness of breath.   12  . LYRICA 100 MG capsule Take 100 mg by mouth 3 (three) times daily as needed (for pain).   1  . nitroGLYCERIN (NITROSTAT) 0.4 MG SL tablet Place 1 tablet (0.4 mg total) under the tongue every 5 (five) minutes as needed for chest pain. 25 tablet 12  . NONFORMULARY OR COMPOUNDED ITEM Apply 1 application topically 4 (four) times daily. Katamine/Gabapentin/Baclofen/Lidocaine/Menthol Cream    .  potassium chloride 20 MEQ/15ML (10%) SOLN Take 15 mLs by mouth daily.  6  . propranolol (INDERAL) 20 MG/5ML solution Take 2.5 mLs (10 mg total) by mouth 2 (two) times daily as needed. 200 mL 3  . RESTASIS MULTIDOSE 0.05 % ophthalmic emulsion Place 1 drop into both eyes 2 (two) times daily.   3  . VENTOLIN HFA 108 (90 Base) MCG/ACT inhaler Take 1 puff by mouth as needed.  12  . Vitamin D, Ergocalciferol, (DRISDOL) 50000 units CAPS capsule Take 50,000 Units by mouth every Friday.     No current facility-administered medications for this visit.     Past Medical History:  Diagnosis Date  . Agoraphobia   . Anxiety   . Bipolar 1 disorder ([place])   . Chronic abdominal pain   . Chronic chest pain   . Depression   . PTSD (post-traumatic stress disorder)   . Scoliosis     Past Surgical History:  Procedure Laterality Date  . MOUTH SURGERY      Social History   Socioeconomic History  . Marital status: Single    Spouse name: Not on file  . Number of children: Not on file  . Years of education: Not on file  . Highest education level: Not on file  Occupational History  . Not on file  Social Needs  . Financial resource strain: Not on file  . Food insecurity:    Worry: Not on file    Inability: Not on file  . Transportation needs:    Medical: Not on file    Non-medical: Not on file  Tobacco Use  . Smoking status: Former Smoker    Packs/day: 0.01    Years: 2.00    Pack years: 0.02    Types: Cigarettes    Last attempt to quit: 02/14/2014    Years since quitting: 3.7  . Smokeless tobacco: Never Used  . Tobacco comment: rare, social smoker  Substance and Sexual Activity  . Alcohol use: Yes    Alcohol/week: 0.0 standard drinks    Comment: occ  . Drug use: No  . Sexual activity: Not on file  Lifestyle  . Physical activity:    Days per week: Not on file    Minutes per session: Not on file  . Stress: Not on file  Relationships  . Social connections:    Talks on phone: Not on  file    Gets together: Not on file    Attends religious service: Not on file    Active member of club or organization: Not on file    Attends meetings of clubs or organizations: Not on file    Relationship status: Not on file  . Intimate partner violence:    Fear of current or ex partner: Not on file    Emotionally abused: Not on file    Physically abused: Not on file  Forced sexual activity: Not on file  Other Topics Concern  . Not on file  Social History Narrative  . Not on file     Vitals:   11/04/17 1039  BP: 118/74  Pulse: 66  SpO2: 98%  Weight: 115 lb (52.2 kg)  Height: '5\' 1"'  (1.549 m)    Wt Readings from Last 3 Encounters:  11/04/17 115 lb (52.2 kg)  10/31/17 114 lb 9.6 oz (52 kg)  09/30/17 113 lb (51.3 kg)     PHYSICAL EXAM General: NAD HEENT: Normal. Neck: No JVD, no thyromegaly. Lungs: Clear to auscultation bilaterally with normal respiratory effort. CV: Regular rate and rhythm, normal S1/S2, no S3/S4, no murmur. No pretibial or periankle edema.  Abdomen: Soft, nontender, no distention.  Neurologic: Alert and oriented.  Psych: Normal affect. Skin: Normal. Musculoskeletal: No gross deformities.    ECG: Reviewed above under Subjective   Labs: Lab Results  Component Value Date/Time   K 3.8 10/31/2017 08:21 PM   K 3.8 10/26/2012 01:12 AM   BUN 11 10/31/2017 08:21 PM   BUN 12 10/26/2012 01:12 AM   CREATININE 0.56 10/31/2017 08:21 PM   CREATININE 0.74 10/26/2012 01:12 AM   ALT 48 12/03/2016 03:07 PM   ALT 14 10/26/2012 01:12 AM   TSH 1.979 10/31/2017 08:21 PM   TSH 1.23 12/09/2016 11:23 AM   HGB 13.7 10/31/2017 08:21 PM   HGB 13.4 10/26/2012 01:12 AM     Lipids: No results found for: LDLCALC, LDLDIRECT, CHOL, TRIG, HDL     ASSESSMENT AND PLAN: 1.  Tachycardia: See discussion above.  Already evaluated by EP and no further testing was indicated.  It is suspected she has sinus tachycardia with PVCs and autonomic dysfunction.  Increased  sodium intake was recommended by EP so that she might be able to take her propranolol.  She is also being scheduled to see a rheumatologist as both her parents have SLE and she may be seeing an endocrinologist in the near future.    Disposition: Follow up with me as needed   Kate Sable, M.D., F.A.C.C.

## 2017-11-05 DIAGNOSIS — E649 Sequelae of unspecified nutritional deficiency: Secondary | ICD-10-CM | POA: Diagnosis not present

## 2017-11-05 DIAGNOSIS — D352 Benign neoplasm of pituitary gland: Secondary | ICD-10-CM | POA: Diagnosis not present

## 2017-11-05 DIAGNOSIS — M5416 Radiculopathy, lumbar region: Secondary | ICD-10-CM | POA: Diagnosis not present

## 2017-11-05 DIAGNOSIS — R000 Tachycardia, unspecified: Secondary | ICD-10-CM | POA: Diagnosis not present

## 2017-11-05 DIAGNOSIS — F4001 Agoraphobia with panic disorder: Secondary | ICD-10-CM | POA: Diagnosis not present

## 2017-11-05 DIAGNOSIS — M4134 Thoracogenic scoliosis, thoracic region: Secondary | ICD-10-CM | POA: Diagnosis not present

## 2017-11-05 DIAGNOSIS — R1032 Left lower quadrant pain: Secondary | ICD-10-CM | POA: Diagnosis not present

## 2017-11-06 ENCOUNTER — Other Ambulatory Visit: Payer: Self-pay | Admitting: Family Medicine

## 2017-11-06 DIAGNOSIS — R1032 Left lower quadrant pain: Secondary | ICD-10-CM

## 2017-11-14 ENCOUNTER — Ambulatory Visit (HOSPITAL_COMMUNITY)
Admission: RE | Admit: 2017-11-14 | Discharge: 2017-11-14 | Disposition: A | Discharge status: Home / Self Care | Payer: Medicare Other | Source: Ambulatory Visit | Attending: Family Medicine | Admitting: Family Medicine

## 2017-11-14 DIAGNOSIS — R1032 Left lower quadrant pain: Secondary | ICD-10-CM

## 2017-11-14 DIAGNOSIS — N838 Other noninflammatory disorders of ovary, fallopian tube and broad ligament: Secondary | ICD-10-CM | POA: Insufficient documentation

## 2017-11-14 DIAGNOSIS — R109 Unspecified abdominal pain: Secondary | ICD-10-CM | POA: Diagnosis not present

## 2017-11-14 DIAGNOSIS — R1111 Vomiting without nausea: Secondary | ICD-10-CM | POA: Diagnosis not present

## 2017-11-19 DIAGNOSIS — M419 Scoliosis, unspecified: Secondary | ICD-10-CM | POA: Diagnosis not present

## 2017-11-27 DIAGNOSIS — M47812 Spondylosis without myelopathy or radiculopathy, cervical region: Secondary | ICD-10-CM | POA: Insufficient documentation

## 2017-11-27 DIAGNOSIS — M47814 Spondylosis without myelopathy or radiculopathy, thoracic region: Secondary | ICD-10-CM | POA: Insufficient documentation

## 2017-11-27 DIAGNOSIS — Z840 Family history of diseases of the skin and subcutaneous tissue: Secondary | ICD-10-CM | POA: Diagnosis not present

## 2017-11-27 DIAGNOSIS — R5382 Chronic fatigue, unspecified: Secondary | ICD-10-CM | POA: Diagnosis not present

## 2017-11-27 DIAGNOSIS — R5383 Other fatigue: Secondary | ICD-10-CM | POA: Insufficient documentation

## 2017-11-28 DIAGNOSIS — Z840 Family history of diseases of the skin and subcutaneous tissue: Secondary | ICD-10-CM | POA: Diagnosis not present

## 2017-11-28 DIAGNOSIS — R5382 Chronic fatigue, unspecified: Secondary | ICD-10-CM | POA: Diagnosis not present

## 2017-11-28 DIAGNOSIS — M47814 Spondylosis without myelopathy or radiculopathy, thoracic region: Secondary | ICD-10-CM | POA: Diagnosis not present

## 2017-12-10 DIAGNOSIS — M47814 Spondylosis without myelopathy or radiculopathy, thoracic region: Secondary | ICD-10-CM | POA: Diagnosis not present

## 2017-12-10 DIAGNOSIS — Z840 Family history of diseases of the skin and subcutaneous tissue: Secondary | ICD-10-CM | POA: Diagnosis not present

## 2017-12-10 DIAGNOSIS — M47812 Spondylosis without myelopathy or radiculopathy, cervical region: Secondary | ICD-10-CM | POA: Diagnosis not present

## 2017-12-10 DIAGNOSIS — E559 Vitamin D deficiency, unspecified: Secondary | ICD-10-CM | POA: Diagnosis not present

## 2017-12-25 ENCOUNTER — Ambulatory Visit (INDEPENDENT_AMBULATORY_CARE_PROVIDER_SITE_OTHER): Payer: Medicare Other | Admitting: "Endocrinology

## 2017-12-25 ENCOUNTER — Encounter: Payer: Self-pay | Admitting: "Endocrinology

## 2017-12-25 VITALS — BP 110/73 | HR 82 | Ht 61.0 in | Wt 114.0 lb

## 2017-12-25 DIAGNOSIS — E440 Moderate protein-calorie malnutrition: Secondary | ICD-10-CM | POA: Diagnosis not present

## 2017-12-25 DIAGNOSIS — E559 Vitamin D deficiency, unspecified: Secondary | ICD-10-CM

## 2017-12-25 DIAGNOSIS — Z8639 Personal history of other endocrine, nutritional and metabolic disease: Secondary | ICD-10-CM | POA: Insufficient documentation

## 2017-12-25 DIAGNOSIS — R634 Abnormal weight loss: Secondary | ICD-10-CM | POA: Diagnosis not present

## 2017-12-25 NOTE — Progress Notes (Signed)
Endocrinology Consult Note                                            12/25/2017, 6:03 PM   Subjective:    Patient ID: Traci Hensley, female    DOB: Jul 30, 1990, PCP Martin Majestic, FNP   Past Medical History:  Diagnosis Date  . Agoraphobia   . Anxiety   . Bipolar 1 disorder ([place])   . Chronic abdominal pain   . Chronic chest pain   . Depression   . PTSD (post-traumatic stress disorder)   . Scoliosis    Past Surgical History:  Procedure Laterality Date  . MOUTH SURGERY     Social History   Socioeconomic History  . Marital status: Single    Spouse name: Not on file  . Number of children: Not on file  . Years of education: Not on file  . Highest education level: Not on file  Occupational History  . Not on file  Social Needs  . Financial resource strain: Not on file  . Food insecurity:    Worry: Not on file    Inability: Not on file  . Transportation needs:    Medical: Not on file    Non-medical: Not on file  Tobacco Use  . Smoking status: Former Smoker    Packs/day: 0.01    Years: 2.00    Pack years: 0.02    Types: Cigarettes    Last attempt to quit: 02/14/2014    Years since quitting: 3.8  . Smokeless tobacco: Never Used  . Tobacco comment: rare, social smoker  Substance and Sexual Activity  . Alcohol use: Yes    Alcohol/week: 0.0 standard drinks    Comment: occ  . Drug use: No  . Sexual activity: Not on file  Lifestyle  . Physical activity:    Days per week: Not on file    Minutes per session: Not on file  . Stress: Not on file  Relationships  . Social connections:    Talks on phone: Not on file    Gets together: Not on file    Attends religious service: Not on file    Active member of club or organization: Not on file    Attends meetings of clubs or organizations: Not on file    Relationship status: Not on file  Other Topics Concern  . Not on file  Social History Narrative  . Not on file   Outpatient Encounter Medications  as of 12/25/2017  Medication Sig  . ADVAIR DISKUS 500-50 MCG/DOSE AEPB Inhale 1 puff into the lungs 2 (two) times daily as needed (for shortness of breath).   Marland Kitchen albuterol (PROVENTIL HFA;VENTOLIN HFA) 108 (90 Base) MCG/ACT inhaler Inhale into the lungs every 6 (six) hours as needed for wheezing or shortness of breath.  . D3-50 50000 units capsule Take 50,000 Units by mouth once a week.  . diclofenac sodium (VOLTAREN) 1 % GEL Apply 4 g topically 4 (four) times daily.   . hydrOXYzine (ATARAX/VISTARIL) 25 MG tablet Take 25 mg by mouth 3 (three) times daily as needed (for allergies).   Marland Kitchen levalbuterol (XOPENEX HFA) 45 MCG/ACT inhaler Inhale 2 puffs into the lungs every 8 (eight) hours as needed for wheezing or shortness of breath.   . levalbuterol (XOPENEX) 1.25 MG/3ML nebulizer solution Take 1.25 mg by nebulization  every 8 (eight) hours as needed for wheezing or shortness of breath.   . NONFORMULARY OR COMPOUNDED ITEM Apply 1 application topically 4 (four) times daily. Katamine/Gabapentin/Baclofen/Lidocaine/Menthol Cream  . norgestimate-ethinyl estradiol (ESTARYLLA) 0.25-35 MG-MCG tablet Take 1 tablet by mouth daily.  . RESTASIS MULTIDOSE 0.05 % ophthalmic emulsion Place 1 drop into both eyes 2 (two) times daily.   . baclofen (LIORESAL) 10 MG tablet Take 10 mg by mouth 2 (two) times daily as needed for muscle spasms.   Marland Kitchen EPINEPHrine 0.3 mg/0.3 mL IJ SOAJ injection Inject 0.3 mg into the muscle once. As needed for allergic reaction  . gabapentin (NEURONTIN) 300 MG capsule Take 300 mg by mouth 3 (three) times daily as needed (pain).   . nitroGLYCERIN (NITROSTAT) 0.4 MG SL tablet Place 1 tablet (0.4 mg total) under the tongue every 5 (five) minutes as needed for chest pain.  Marland Kitchen propranolol (INDERAL) 20 MG/5ML solution Take 2.5 mLs (10 mg total) by mouth 2 (two) times daily as needed. (Patient not taking: Reported on 12/25/2017)  . [DISCONTINUED] ESTARYLLA 0.25-35 MG-MCG tablet Take 1 tablet by mouth daily.    . [DISCONTINUED] LYRICA 100 MG capsule Take 100 mg by mouth 3 (three) times daily as needed (for pain).   . [DISCONTINUED] potassium chloride 20 MEQ/15ML (10%) SOLN Take 15 mLs by mouth daily.  . [DISCONTINUED] VENTOLIN HFA 108 (90 Base) MCG/ACT inhaler Take 1 puff by mouth as needed.  . [DISCONTINUED] Vitamin D, Ergocalciferol, (DRISDOL) 50000 units CAPS capsule Take 50,000 Units by mouth every Friday.   No facility-administered encounter medications on file as of 12/25/2017.    ALLERGIES: Allergies  Allergen Reactions  . Adhesive [Tape] Other (See Comments)    Eats skin almost instantly  . Blue Dyes (Parenteral) Other (See Comments)    BLUE DYES IS OK BY ITSELF, BUT CANNOT BE COMBINED WITH GREEN DYES  . Contrast Media [Iodinated Diagnostic Agents] Nausea And Vomiting    Vomiting up blood, shaking and tremors uncontrollable, dizziness and lightheadness  . Corn-Containing Products Anaphylaxis and Nausea And Vomiting    ALSO GRASS FAMILY PLANTS, VEGETABLES, ETC.   . Green Dyes Other (See Comments)    GREEN DYE IS OK BY ITSELF, BUT CANNOT BE COMBINED WITH BLUE DYE  . Nickel Itching, Swelling, Rash and Other (See Comments)  . Onion     Cannot be around onion at all or she has breathing difficulty  . Other Nausea Only, Rash and Other (See Comments)    Sunlight causes extreme fatigue , Migraine , Cat Dander  . Petroleum Distillate Itching, Swelling and Rash  . Silicone Rash    rash  . Cortisporin [Bacitra-Neomycin-Polymyxin-Hc]     Otic 1% suspension- causes hearing loss and pain.  . Yellow Jacket Venom Hives    Severe disorientation    VACCINATION STATUS:  There is no immunization history on file for this patient.  HPI [name] is 27 y.o. female who presents today with a medical history as above. she is being seen in consultation for pituitary enlargement requested by Martin Majestic, FNP. -She denies any prior history of dietary, thyroid, adrenal dysfunctions.  She  was incidentally found to have prominent pituitary gland on MRI of the brain done in February 2019 as a result of numbness and weakness of upper extremities.  The description was that pituitary gland was extending into the suprasellar fossa potentially due to shallow sella lobe mass was not excluded.  Drop sella/pituitary protocol MRI was not done.  However, on October 31, 2017 due to unexplained altered level of consciousness she underwent CT head without contrast.  That study showed stable prominent pituitary gland measuring 8 mm craniocaudal with superior convex margin, size within normal limits for age. -She did not have endocrine work-up to evaluate the functional level of her pituitary gland. -She is on chronic and ongoing estrogen supplement with oral contraceptive pills, as a result of irregular menstrual cycles. -She denies palpitations, heat intolerance, tremors.  She has history of chronic abdominal pain. -Patient with history of smoking, history of PTSD, and social deprivation. -  Review of Systems  Constitutional:  + Always had been light weight, no recent significant weight changes, + fatigue, no subjective hyperthermia, no subjective hypothermia Eyes: no blurry vision, no xerophthalmia ENT: no sore throat, no nodules palpated in throat, no dysphagia/odynophagia, no hoarseness Cardiovascular: no Chest Pain, no Shortness of Breath, no palpitations, no leg swelling Respiratory: no cough, no SOB Gastrointestinal: no Nausea/Vomiting/Diarhhea Musculoskeletal: no muscle/joint aches Skin: no rashes Neurological: no tremors, no numbness, no tingling, no dizziness Psychiatric: no depression, no anxiety  Objective:    BP 110/73   Pulse 82   Ht 5\' 1"  (1.549 m)   Wt 114 lb (51.7 kg)   BMI 21.54 kg/m   Wt Readings from Last 3 Encounters:  12/25/17 114 lb (51.7 kg)  11/04/17 115 lb (52.2 kg)  10/31/17 114 lb 9.6 oz (52 kg)    Physical Exam  Constitutional: + Appropriate weight for  height, not in acute distress, normal state of mind. Eyes: PERRLA, EOMI, no exophthalmos ENT: moist mucous membranes, no thyromegaly, no cervical lymphadenopathy Cardiovascular: normal precordial activity, Regular Rate and Rhythm, no Murmur/Rubs/Gallops Respiratory:  adequate breathing efforts, no gross chest deformity, Clear to auscultation bilaterally Gastrointestinal: abdomen soft, Non -tender, No distension, Bowel Sounds present Musculoskeletal: no gross deformities, strength intact in all four extremities Skin: moist, warm, no rashes Neurological: no tremor with outstretched hands, Deep tendon reflexes normal in all four extremities.  CMP ( most recent) CMP     Component Value Date/Time   NA 139 10/31/2017 2021   NA 139 10/26/2012 0112   K 3.8 10/31/2017 2021   K 3.8 10/26/2012 0112   CL 105 10/31/2017 2021   CL 106 10/26/2012 0112   CO2 23 10/31/2017 2021   CO2 25 10/26/2012 0112   GLUCOSE 99 10/31/2017 2021   GLUCOSE 102 (H) 10/26/2012 0112   BUN 11 10/31/2017 2021   BUN 12 10/26/2012 0112   CREATININE 0.56 10/31/2017 2021   CREATININE 0.74 10/26/2012 0112   CALCIUM 9.4 10/31/2017 2021   CALCIUM 9.4 10/26/2012 0112   PROT 8.0 12/03/2016 1507   PROT 8.2 10/26/2012 0112   ALBUMIN 4.7 12/03/2016 1507   ALBUMIN 4.5 10/26/2012 0112   AST 36 12/03/2016 1507   AST 20 10/26/2012 0112   ALT 48 12/03/2016 1507   ALT 14 10/26/2012 0112   ALKPHOS 51 12/03/2016 1507   ALKPHOS 70 10/26/2012 0112   BILITOT 1.0 12/03/2016 1507   BILITOT 0.6 10/26/2012 0112   GFRNONAA >60 10/31/2017 2021   GFRNONAA >60 10/26/2012 0112   GFRAA >60 10/31/2017 2021   GFRAA >60 10/26/2012 0112     Diabetic Labs (most recent): Lab Results  Component Value Date   HGBA1C 5.2 07/08/2014      Lab Results  Component Value Date   TSH 1.979 10/31/2017   TSH 1.23 12/09/2016   TSH 2.13 10/26/2012   FREET4 0.9 12/09/2016  Assessment & Plan:   1. History of enlargement of pituitary  gland 2. Moderate malnutrition ([place]) 3. Vitamin D deficiency  - EPHRATA VERVILLE  is being seen at a kind request of Martin Majestic, FNP. -Review of her endocrine records reveals prominent pituitary gland for age, measuring 8 mm craniocaudal, with no major tissue shifts .  Would include a description of her pituitary CT and MRI, she may have shallow sella. -She will need pituitary/sella protocol MRI in 1 year to study the pituitary size in detail any comparison. -However, she would benefit from endocrine work-up of hypothyroid lung including thyroid function tests, a.m. cortisol, insulin like growth factor I, as well as prolactin level. -Review of her records is consistent with hypoestrogenic state, currently on COC for menstrual irregularities.  This may put her at risk for osteopenia/osteoporosis or risk of never achieving peak bone mass.  She will need at least baseline bone density study.  At this time her insurance declined coverage.  She will be reconsidered for this study on subsequent visits.  - I did not initiate any new prescriptions today. - I advised her  to maintain close follow up with Martin Majestic, FNP for primary care needs.   - Time spent with the patient: 30 minutes, of which >50% was spent in obtaining information about her symptoms, reviewing her previous labs, evaluations, and treatments, counseling her about her pituitary enlargement, and developing a plan to functional work-up to determined treatment interventions if necessary.    Traci Hensley participated in the discussions, expressed understanding, and voiced agreement with the above plans.  All questions were answered to her satisfaction. she is encouraged to contact clinic should she have any questions or concerns prior to her return visit.  Follow up plan: Return in about 1 week (around 01/01/2018), or labs before 8AM, for Follow up with Pre-visit Labs.   Glade Lloyd, MD [facility] [address] [place], [place] [address] Phone: [phone]  Fax: [phone]     12/25/2017, 6:03 PM  This note was partially dictated with voice recognition software. Similar sounding words can be transcribed inadequately or may not  be corrected upon review.

## 2017-12-28 ENCOUNTER — Other Ambulatory Visit: Payer: Self-pay

## 2017-12-28 ENCOUNTER — Emergency Department (HOSPITAL_COMMUNITY)
Admission: EM | Admit: 2017-12-28 | Discharge: 2017-12-29 | Disposition: A | Discharge status: Home / Self Care | Payer: Medicare Other | Attending: Emergency Medicine | Admitting: Emergency Medicine

## 2017-12-28 ENCOUNTER — Encounter (HOSPITAL_COMMUNITY): Payer: Self-pay | Admitting: *Deleted

## 2017-12-28 DIAGNOSIS — R002 Palpitations: Secondary | ICD-10-CM | POA: Diagnosis not present

## 2017-12-28 DIAGNOSIS — Z79899 Other long term (current) drug therapy: Secondary | ICD-10-CM | POA: Insufficient documentation

## 2017-12-28 DIAGNOSIS — Z87891 Personal history of nicotine dependence: Secondary | ICD-10-CM | POA: Insufficient documentation

## 2017-12-28 DIAGNOSIS — F319 Bipolar disorder, unspecified: Secondary | ICD-10-CM | POA: Insufficient documentation

## 2017-12-28 DIAGNOSIS — M419 Scoliosis, unspecified: Secondary | ICD-10-CM | POA: Insufficient documentation

## 2017-12-28 DIAGNOSIS — R0602 Shortness of breath: Secondary | ICD-10-CM | POA: Diagnosis present

## 2017-12-28 DIAGNOSIS — F419 Anxiety disorder, unspecified: Secondary | ICD-10-CM | POA: Insufficient documentation

## 2017-12-28 NOTE — ED Triage Notes (Signed)
Pt states that she felt like her heart was stopping and then like it was beating fast, pt shaking in treatment room states that she is unable to describe the pain,

## 2017-12-29 DIAGNOSIS — Z8639 Personal history of other endocrine, nutritional and metabolic disease: Secondary | ICD-10-CM | POA: Diagnosis not present

## 2017-12-29 DIAGNOSIS — E559 Vitamin D deficiency, unspecified: Secondary | ICD-10-CM | POA: Diagnosis not present

## 2017-12-29 DIAGNOSIS — R002 Palpitations: Secondary | ICD-10-CM | POA: Diagnosis not present

## 2017-12-29 LAB — TROPONIN I: Troponin I: <0.03 ng/mL (ref <0.03)

## 2017-12-29 LAB — COMPREHENSIVE METABOLIC PANEL
ALT: 10 U/L (ref 0–44)
AST: 15 U/L (ref 15–41)
Albumin: 4.2 g/dL (ref 3.5–5.0)
Alkaline Phosphatase: 40 U/L (ref 38–126)
Anion gap: 5 (ref 5–15)
BUN: 13 mg/dL (ref 6–20)
CO2: 25 mmol/L (ref 22–32)
Calcium: 8.9 mg/dL (ref 8.9–10.3)
Chloride: 108 mmol/L (ref 98–111)
Creatinine, Ser: 0.75 mg/dL (ref 0.44–1.00)
GFR calc Af Amer: >60 mL/min (ref >60)
GFR calc non Af Amer: >60 mL/min (ref >60)
Glucose, Bld: 121 mg/dL — ABNORMAL HIGH (ref 70–99)
Potassium: 3.6 mmol/L (ref 3.5–5.1)
Sodium: 138 mmol/L (ref 135–145)
Total Bilirubin: 0.6 mg/dL (ref 0.3–1.2)
Total Protein: 7.1 g/dL (ref 6.5–8.1)

## 2017-12-29 LAB — MAGNESIUM: Magnesium: 1.9 mg/dL (ref 1.7–2.4)

## 2017-12-29 NOTE — ED Notes (Signed)
Patient refused another stick to redraw CBC. Dr. Tomi Bamberger made aware of patients refusal.

## 2017-12-29 NOTE — ED Provider Notes (Signed)
[facility] EMERGENCY DEPARTMENT Provider Note   CSN: [id number] Arrival date & time: 12/28/17  2337  Time seen 12:46 AM   History   Chief Complaint Chief Complaint  Patient presents with  . Chest Pain    HPI Traci Hensley is a 27 y.o. female.  HPI patient is here for mother who states the patient has "ocular albinism".  Although she has not been formally diagnosed with it.  She has multiple allergies including UV light sensitivity and patient is sitting in a dark room.  She states she was trying to go to bed tonight and she felt like her heart stop.  She checked her pulse ox monitor and at that time her heart rate was 58.  And it said it was irregular.  Her heart rate then started jumping up and beating faster and it got up to 171.  She states she was shivering all over without feeling cold.  She states that lasted about 15 minutes.  He denies chest pain but states she has chest pressure that still present.  She states she feels like she is short of breath.  She denies vomiting but states she did have a little nausea.  She states she felt like she was going to pass out but denies diaphoresis.  Patient has been seen a cardiologist for similar problems.  She states originally her heart rate was too fast and lately can heart rate is been too low, she states her heart rate is in the 70s and when she sleeps it drops down to 40-50.  She has been prescribed propranolol which she is not taking.  She also has been advised to increase the salt in her diet for pots which she states she is not doing because "my sodium level is normal on the test".  Mother states they just started endocrinology evaluation this past week for a abnormal pituitary gland.  PCP Martin Majestic, FNP   Past Medical History:  Diagnosis Date  . Agoraphobia   . Anxiety   . Bipolar 1 disorder ([place])   . Chronic abdominal pain   . Chronic chest pain   . Depression   . PTSD (post-traumatic stress disorder)   .  Scoliosis     Patient Active Problem List   Diagnosis Date Noted  . History of enlargement of pituitary gland 12/25/2017  . Moderate malnutrition ([place]) 12/25/2017  . Loss of weight 12/25/2017  . Vitamin D deficiency 12/25/2017  . Ingrown toenail 11/23/2015  . Dyspepsia 08/31/2014  . Abdominal pain 06/29/2014  . Achilles tendinitis of both lower extremities 01/23/2014  . Palpitations 04/19/2013  . Chest pain 04/19/2013  . Sinus tachycardia 04/19/2013  . Anxiety 04/19/2013    Past Surgical History:  Procedure Laterality Date  . MOUTH SURGERY       OB History   None      Home Medications    Prior to Admission medications   Medication Sig Start Date End Date Taking? Authorizing Provider  ADVAIR DISKUS 500-50 MCG/DOSE AEPB Inhale 1 puff into the lungs 2 (two) times daily as needed (for shortness of breath).  07/25/14   [provider]  albuterol (PROVENTIL HFA;VENTOLIN HFA) 108 (90 Base) MCG/ACT inhaler Inhale into the lungs every 6 (six) hours as needed for wheezing or shortness of breath.    [provider]  baclofen (LIORESAL) 10 MG tablet Take 10 mg by mouth 2 (two) times daily as needed for muscle spasms.  08/09/17   [provider]  D3-50 50000 units capsule Take 50,000 Units by mouth once a week. 11/17/17   [provider]  diclofenac sodium (VOLTAREN) 1 % GEL Apply 4 g topically 4 (four) times daily.  08/09/17   [provider]  EPINEPHrine 0.3 mg/0.3 mL IJ SOAJ injection Inject 0.3 mg into the muscle once. As needed for allergic reaction    [provider]  gabapentin (NEURONTIN) 300 MG capsule Take 300 mg by mouth 3 (three) times daily as needed (pain).  06/03/16   [provider]  hydrOXYzine (ATARAX/VISTARIL) 25 MG tablet Take 25 mg by mouth 3 (three) times daily as needed (for allergies).  07/19/17   [provider]  levalbuterol Penne Lash HFA) 45 MCG/ACT inhaler Inhale 2 puffs into the lungs every 8  (eight) hours as needed for wheezing or shortness of breath.  12/17/16   [provider]  levalbuterol Penne Lash) 1.25 MG/3ML nebulizer solution Take 1.25 mg by nebulization every 8 (eight) hours as needed for wheezing or shortness of breath.  06/06/17   [provider]  nitroGLYCERIN (NITROSTAT) 0.4 MG SL tablet Place 1 tablet (0.4 mg total) under the tongue every 5 (five) minutes as needed for chest pain. 06/17/14   Jerline Pain, MD  NONFORMULARY OR COMPOUNDED ITEM Apply 1 application topically 4 (four) times daily. Katamine/Gabapentin/Baclofen/Lidocaine/Menthol Cream    [provider]  norgestimate-ethinyl estradiol (ESTARYLLA) 0.25-35 MG-MCG tablet Take 1 tablet by mouth daily.    [provider]  propranolol (INDERAL) 20 MG/5ML solution Take 2.5 mLs (10 mg total) by mouth 2 (two) times daily as needed. Patient not taking: Reported on 12/25/2017 12/06/16   Lendon Colonel, NP  RESTASIS MULTIDOSE 0.05 % ophthalmic emulsion Place 1 drop into both eyes 2 (two) times daily.  08/09/17   [provider]    Family History Family History  Problem Relation Age of Onset  . Arrhythmia Mother   . Colon cancer Neg Hx     Social History Social History   Tobacco Use  . Smoking status: Former Smoker    Packs/day: 0.01    Years: 2.00    Pack years: 0.02    Types: Cigarettes    Last attempt to quit: 02/14/2014    Years since quitting: 3.8  . Smokeless tobacco: Never Used  . Tobacco comment: rare, social smoker  Substance Use Topics  . Alcohol use: Yes    Alcohol/week: 0.0 standard drinks    Comment: occ  . Drug use: No  on disability for anxiety and PTSD   Allergies   Adhesive [tape]; Blue dyes (parenteral); Contrast media [iodinated diagnostic agents]; Corn-containing products; Green dyes; Nickel; Onion; Other; Petroleum distillate; Silicone; Cortisporin [bacitra-neomycin-polymyxin-hc]; and Yellow jacket venom   Review of Systems Review of  Systems  All other systems reviewed and are negative.    Physical Exam Updated Vital Signs BP 128/88 (BP Location: Right Arm)   Pulse 96   Temp 98.1 F (36.7 C) (Oral)   Resp 20   Ht 5\' 1"  (1.549 m)   Wt 51.7 kg   SpO2 99%   BMI 21.54 kg/m   Vital signs normal    Physical Exam  Constitutional: She is oriented to person, place, and time. She appears well-developed and well-nourished.  Non-toxic appearance. She does not appear ill. No distress.  HENT:  Head: Normocephalic and atraumatic.  Right Ear: External ear normal.  Left Ear: External ear normal.  Nose: Nose normal. No mucosal edema or rhinorrhea.  Mouth/Throat:  Oropharynx is clear and moist and mucous membranes are normal. No dental abscesses or uvula swelling.  Eyes: Pupils are equal, round, and reactive to light. Conjunctivae and EOM are normal.  Blue eyes  Neck: Normal range of motion and full passive range of motion without pain. Neck supple.  Cardiovascular: Normal rate, regular rhythm and normal heart sounds. Exam reveals no gallop and no friction rub.  No murmur heard. Pulmonary/Chest: Effort normal and breath sounds normal. No respiratory distress. She has no wheezes. She has no rhonchi. She has no rales. She exhibits no tenderness and no crepitus.  Abdominal: Soft. Normal appearance and bowel sounds are normal. She exhibits no distension. There is no tenderness. There is no rebound and no guarding.  Musculoskeletal: Normal range of motion. She exhibits no edema or tenderness.  Moves all extremities well.   Neurological: She is alert and oriented to person, place, and time. She has normal strength. No cranial nerve deficit.  Skin: Skin is warm, dry and intact. No rash noted. No erythema. There is pallor.  Psychiatric: She has a normal mood and affect. Her speech is normal and behavior is normal. Her mood appears not anxious.  Nursing note and vitals reviewed.    ED Treatments / Results  Labs (all labs  ordered are listed, but only abnormal results are displayed) Results for orders placed or performed during the hospital encounter of 12/28/17  Comprehensive metabolic panel  Result Value Ref Range   Sodium 138 135 - 145 mmol/L   Potassium 3.6 3.5 - 5.1 mmol/L   Chloride 108 98 - 111 mmol/L   CO2 25 22 - 32 mmol/L   Glucose, Bld 121 (H) 70 - 99 mg/dL   BUN 13 6 - 20 mg/dL   Creatinine, Ser 0.75 0.44 - 1.00 mg/dL   Calcium 8.9 8.9 - 10.3 mg/dL   Total Protein 7.1 6.5 - 8.1 g/dL   Albumin 4.2 3.5 - 5.0 g/dL   AST 15 15 - 41 U/L   ALT 10 0 - 44 U/L   Alkaline Phosphatase 40 38 - 126 U/L   Total Bilirubin 0.6 0.3 - 1.2 mg/dL   GFR calc non Af Amer >60 >60 mL/min   GFR calc Af Amer >60 >60 mL/min   Anion gap 5 5 - 15  Troponin I  Result Value Ref Range   Troponin I <0.03 <0.03 ng/mL  Magnesium  Result Value Ref Range   Magnesium 1.9 1.7 - 2.4 mg/dL   Laboratory interpretation all normal    EKG EKG Interpretation  Date/Time:  Sunday December 28 2017 23:51:23 EDT Ventricular Rate:  122 PR Interval:    QRS Duration: 89 QT Interval:  317 QTC Calculation: 452 R Axis:   94 Text Interpretation:  Sinus tachycardia Consider right atrial enlargement Consider right ventricular hypertrophy Since last tracing rate faster 31 Oct 2017 Confirmed by Rolland Porter [id number]) on 12/29/2017 12:12:55 AM   Radiology No results found.  Procedures Procedures (including critical care time)  Medications Ordered in ED Medications - No data to display   Initial Impression / Assessment and Plan / ED Course  I have reviewed the triage vital signs and the nursing notes.  Pertinent labs & imaging results that were available during my care of the patient were reviewed by me and considered in my medical decision making (see chart for details).    Patient has multiple allergies and intolerances.  I asked her what evaluation she wanted done tonight.  She could not  decide so I let her mother discuss and  she did finally decide to get basic blood work rechecked.  When I looked her up labs were last checked in August.  Patient has been complaining of palpitations and chest pain since at least 2013.  She has had multiple cardiology appointments.  They have recommended she increase her salt intake for orthostatic hypotension, they also recommended she take propranolol.  Patient is not following either recommendation.  During my exam her heart rate was down to 87 and when she sat up it did pop up to 115.  Mother states patient is supposed to be on oxygen for her asthma.  Her pulse ox is 99 to 100% on the monitor.  Mother states however she is allergic to the nasal cannula material.  She states it has ordered 24/7 but she only uses it as needed.  Check at 3:50 AM patient's heart rate is in the 80s.  Patient states when they drew her blood that they "went through the vein".  She states she feels like there is "something in my arm".  Her mother specifies that patient can see and feel things other people cannot see or feel.  Patient then states she feels like there is a blood clot.  She was given a ice pack to use.  When I look at her arm I do not see any bruising or obvious swelling or redness.  I explained to her that there is nothing I could do that the body will just resorb the blood clot.  She was discharged home.  Final Clinical Impressions(s) / ED Diagnoses   Final diagnoses:  Palpitations    ED Discharge Orders    None     Plan discharge  Rolland Porter, MD, Barbette Or, MD 12/29/17 [phone]

## 2017-12-29 NOTE — Discharge Instructions (Addendum)
Please follow up with Dr Jacinta Shoe about your racing heart and slow heart beat. Use ice and heat for comfort on your arm.

## 2017-12-29 NOTE — ED Notes (Signed)
Patient complains of pain to right [facility] area where labs were previously drawn. Ice pack given and applied. MD notified of patient complaint.

## 2017-12-31 DIAGNOSIS — M79601 Pain in right arm: Secondary | ICD-10-CM | POA: Diagnosis not present

## 2017-12-31 DIAGNOSIS — L0390 Cellulitis, unspecified: Secondary | ICD-10-CM | POA: Diagnosis not present

## 2017-12-31 DIAGNOSIS — I82601 Acute embolism and thrombosis of unspecified veins of right upper extremity: Secondary | ICD-10-CM | POA: Diagnosis not present

## 2018-01-01 ENCOUNTER — Encounter: Payer: Self-pay | Admitting: "Endocrinology

## 2018-01-01 ENCOUNTER — Ambulatory Visit (INDEPENDENT_AMBULATORY_CARE_PROVIDER_SITE_OTHER): Payer: Medicare Other | Admitting: "Endocrinology

## 2018-01-01 VITALS — BP 108/73 | HR 62 | Ht 61.0 in | Wt 114.0 lb

## 2018-01-01 DIAGNOSIS — E559 Vitamin D deficiency, unspecified: Secondary | ICD-10-CM | POA: Diagnosis not present

## 2018-01-01 DIAGNOSIS — Z8639 Personal history of other endocrine, nutritional and metabolic disease: Secondary | ICD-10-CM

## 2018-01-01 MED ORDER — D3-50 1.25 MG (50000 UT) PO CAPS: ORAL_CAPSULE | ORAL | 1 refills | Status: DC

## 2018-01-01 NOTE — Progress Notes (Signed)
Endocrinology follow-up  Note                                            01/01/2018, 1:00 PM   Subjective:    Patient ID: Traci Hensley, female    DOB: [birth year], PCP Martin Majestic, FNP   Past Medical History:  Diagnosis Date  . Agoraphobia   . Anxiety   . Bipolar 1 disorder ([place])   . Chronic abdominal pain   . Chronic chest pain   . Depression   . PTSD (post-traumatic stress disorder)   . Scoliosis    Past Surgical History:  Procedure Laterality Date  . MOUTH SURGERY     Social History   Socioeconomic History  . Marital status: Single    Spouse name: Not on file  . Number of children: Not on file  . Years of education: Not on file  . Highest education level: Not on file  Occupational History  . Not on file  Social Needs  . Financial resource strain: Not on file  . Food insecurity:    Worry: Not on file    Inability: Not on file  . Transportation needs:    Medical: Not on file    Non-medical: Not on file  Tobacco Use  . Smoking status: Former Smoker    Packs/day: 0.01    Years: 2.00    Pack years: 0.02    Types: Cigarettes    Last attempt to quit: 02/14/2014    Years since quitting: 3.8  . Smokeless tobacco: Never Used  . Tobacco comment: rare, social smoker  Substance and Sexual Activity  . Alcohol use: Yes    Alcohol/week: 0.0 standard drinks    Comment: occ  . Drug use: No  . Sexual activity: Not on file  Lifestyle  . Physical activity:    Days per week: Not on file    Minutes per session: Not on file  . Stress: Not on file  Relationships  . Social connections:    Talks on phone: Not on file    Gets together: Not on file    Attends religious service: Not on file    Active member of club or organization: Not on file    Attends meetings of clubs or organizations: Not on file    Relationship status: Not on file  Other Topics Concern  . Not on file  Social History Narrative  . Not on file   Outpatient Encounter  Medications as of 01/01/2018  Medication Sig  . ADVAIR DISKUS 500-50 MCG/DOSE AEPB Inhale 1 puff into the lungs 2 (two) times daily as needed (for shortness of breath).   Marland Kitchen albuterol (PROVENTIL HFA;VENTOLIN HFA) 108 (90 Base) MCG/ACT inhaler Inhale into the lungs every 6 (six) hours as needed for wheezing or shortness of breath.  . baclofen (LIORESAL) 10 MG tablet Take 10 mg by mouth 2 (two) times daily as needed for muscle spasms.   . D3-50 50000 units capsule Take 1 capsule 2 x a week  . diclofenac sodium (VOLTAREN) 1 % GEL Apply 4 g topically 4 (four) times daily.   Marland Kitchen EPINEPHrine 0.3 mg/0.3 mL IJ SOAJ injection Inject 0.3 mg into the muscle once. As needed for allergic reaction  . gabapentin (NEURONTIN) 300 MG capsule Take 300 mg by mouth 3 (three) times daily as needed (  pain).   . hydrOXYzine (ATARAX/VISTARIL) 25 MG tablet Take 25 mg by mouth 3 (three) times daily as needed (for allergies).   Marland Kitchen levalbuterol (XOPENEX HFA) 45 MCG/ACT inhaler Inhale 2 puffs into the lungs every 8 (eight) hours as needed for wheezing or shortness of breath.   . levalbuterol (XOPENEX) 1.25 MG/3ML nebulizer solution Take 1.25 mg by nebulization every 8 (eight) hours as needed for wheezing or shortness of breath.   . nitroGLYCERIN (NITROSTAT) 0.4 MG SL tablet Place 1 tablet (0.4 mg total) under the tongue every 5 (five) minutes as needed for chest pain.  . NONFORMULARY OR COMPOUNDED ITEM Apply 1 application topically 4 (four) times daily. Katamine/Gabapentin/Baclofen/Lidocaine/Menthol Cream  . norgestimate-ethinyl estradiol (ESTARYLLA) 0.25-35 MG-MCG tablet Take 1 tablet by mouth daily.  . propranolol (INDERAL) 20 MG/5ML solution Take 2.5 mLs (10 mg total) by mouth 2 (two) times daily as needed. (Patient not taking: Reported on 12/25/2017)  . RESTASIS MULTIDOSE 0.05 % ophthalmic emulsion Place 1 drop into both eyes 2 (two) times daily.   . [DISCONTINUED] D3-50 50000 units capsule Take 50,000 Units by mouth once a week.    No facility-administered encounter medications on file as of 01/01/2018.    ALLERGIES: Allergies  Allergen Reactions  . Adhesive [Tape] Other (See Comments)    Eats skin almost instantly  . Blue Dyes (Parenteral) Other (See Comments)    BLUE DYES IS OK BY ITSELF, BUT CANNOT BE COMBINED WITH GREEN DYES  . Contrast Media [Iodinated Diagnostic Agents] Nausea And Vomiting    Vomiting up blood, shaking and tremors uncontrollable, dizziness and lightheadness  . Corn-Containing Products Anaphylaxis and Nausea And Vomiting    ALSO GRASS FAMILY PLANTS, VEGETABLES, ETC.   . Green Dyes Other (See Comments)    GREEN DYE IS OK BY ITSELF, BUT CANNOT BE COMBINED WITH BLUE DYE  . Nickel Itching, Swelling, Rash and Other (See Comments)  . Onion     Cannot be around onion at all or she has breathing difficulty  . Other Nausea Only, Rash and Other (See Comments)    Sunlight causes extreme fatigue , Migraine , Cat Dander  . Petroleum Distillate Itching, Swelling and Rash  . Silicone Rash    rash  . Cortisporin [Bacitra-Neomycin-Polymyxin-Hc]     Otic 1% suspension- causes hearing loss and pain.  . Yellow Jacket Venom Hives    Severe disorientation    VACCINATION STATUS:  There is no immunization history on file for this patient.  HPI Traci Hensley is 27 y.o. female who presents today with a medical history as above. she is returning with endocrine work-up of pituitary enlargement documented on 2 previous pituitary/sella imagings.    -She denies any prior history of pituitary , thyroid, adrenal dysfunctions.  She was incidentally found to have prominent pituitary gland on MRI of the brain done in February 2019 as a result of numbness and weakness of upper extremities.  The description was that pituitary gland was extending into the suprasellar fossa potentially due to shallow sella ,  mass was not excluded.  sella/pituitary protocol MRI was not done.  However, on October 31, 2017 due to  unexplained altered level of consciousness she underwent CT head without contrast.  That study showed stable prominent pituitary gland measuring 8 mm craniocaudal with superior convex margin, size within normal limits for age. -Her previsit endocrine work-up ruled out thyroid, adrenal dysfunctions.  Her insulin-like growth factor still in progress.    -She is on chronic and ongoing  estrogen supplement with oral contraceptive pills, as a result of irregular menstrual cycles. -She describes on and off occasions, denies  heat intolerance, tremors.  She has history of chronic abdominal pain. -Patient with history of smoking, history of PTSD, and social deprivation. -She has no new complaints today.  Review of Systems  Constitutional:  + Always has been light weight, no recent significant weight changes, + fatigue, no subjective hyperthermia, no subjective hypothermia Eyes: no blurry vision, no xerophthalmia ENT: no sore throat, no nodules palpated in throat, no dysphagia/odynophagia, no hoarseness  Musculoskeletal: no muscle/joint aches Skin: no rashes Neurological: no tremors, no numbness, no tingling, no dizziness Psychiatric: no depression, no anxiety  Objective:    BP 108/73   Pulse 62   Ht 5' 1" (1.549 m)   Wt 114 lb (51.7 kg)   BMI 21.54 kg/m   Wt Readings from Last 3 Encounters:  01/01/18 114 lb (51.7 kg)  12/28/17 114 lb (51.7 kg)  12/25/17 114 lb (51.7 kg)    Physical Exam  Constitutional: + Appropriate weight for height, not in acute distress, normal state of mind. Eyes: PERRLA, EOMI, no exophthalmos ENT: moist mucous membranes, no thyromegaly, no cervical lymphadenopathy Cardiovascular: normal precordial activity, Regular Rate and Rhythm, no Murmur/Rubs/Gallops Respiratory:  adequate breathing efforts, no gross chest deformity, Clear to auscultation bilaterally Gastrointestinal: abdomen soft, Non -tender, No distension, Bowel Sounds present Musculoskeletal: no gross  deformities, strength intact in all four extremities Skin: moist, warm, no rashes Neurological: no tremor with outstretched hands, Deep tendon reflexes normal in all four extremities.   CMP     Component Value Date/Time   NA 138 12/29/2017 0155   NA 139 10/26/2012 0112   K 3.6 12/29/2017 0155   K 3.8 10/26/2012 0112   CL 108 12/29/2017 0155   CL 106 10/26/2012 0112   CO2 25 12/29/2017 0155   CO2 25 10/26/2012 0112   GLUCOSE 121 (H) 12/29/2017 0155   GLUCOSE 102 (H) 10/26/2012 0112   BUN 13 12/29/2017 0155   BUN 12 10/26/2012 0112   CREATININE 0.75 12/29/2017 0155   CREATININE 0.74 10/26/2012 0112   CALCIUM 8.9 12/29/2017 0155   CALCIUM 9.4 10/26/2012 0112   PROT 7.1 12/29/2017 0155   PROT 8.2 10/26/2012 0112   ALBUMIN 4.2 12/29/2017 0155   ALBUMIN 4.5 10/26/2012 0112   AST 15 12/29/2017 0155   AST 20 10/26/2012 0112   ALT 10 12/29/2017 0155   ALT 14 10/26/2012 0112   ALKPHOS 40 12/29/2017 0155   ALKPHOS 70 10/26/2012 0112   BILITOT 0.6 12/29/2017 0155   BILITOT 0.6 10/26/2012 0112   GFRNONAA >60 12/29/2017 0155   GFRNONAA >60 10/26/2012 0112   GFRAA >60 12/29/2017 0155   GFRAA >60 10/26/2012 0112     Diabetic Labs (most recent): Lab Results  Component Value Date   HGBA1C 5.2 07/08/2014      Lab Results  Component Value Date   TSH 4.46 12/29/2017   TSH 1.979 10/31/2017   TSH 1.23 12/09/2016   TSH 2.13 10/26/2012   FREET4 1.1 12/29/2017   FREET4 0.9 12/09/2016    Recent Results (from the past 2160 hour(s))  Troponin I     Status: None   Collection Time: 10/31/17  8:21 PM  Result Value Ref Range   Troponin I <0.03 <0.03 ng/mL    Comment: Performed at [facility], [address] [place], [place] [address]  CBC with Differential     Status: None   Collection  Time: 10/31/17  8:21 PM  Result Value Ref Range   WBC 10.3 4.0 - 10.5 K/uL   RBC 4.48 3.87 - 5.11 MIL/uL   Hemoglobin 13.7 12.0 - 15.0 g/dL   HCT 40.6 36.0 - 46.0 %   MCV 90.6 78.0 - 100.0 fL    MCH 30.6 26.0 - 34.0 pg   MCHC 33.7 30.0 - 36.0 g/dL   RDW 12.1 11.5 - 15.5 %   Platelets 251 150 - 400 K/uL   Neutrophils Relative % 75 %   Neutro Abs 7.7 1.7 - 7.7 K/uL   Lymphocytes Relative 19 %   Lymphs Abs 1.9 0.7 - 4.0 K/uL   Monocytes Relative 5 %   Monocytes Absolute 0.5 0.1 - 1.0 K/uL   Eosinophils Relative 1 %   Eosinophils Absolute 0.1 0.0 - 0.7 K/uL   Basophils Relative 0 %   Basophils Absolute 0.0 0.0 - 0.1 K/uL    Comment: Performed at [facility], [address], [place], [place] [address]  Basic metabolic panel     Status: None   Collection Time: 10/31/17  8:21 PM  Result Value Ref Range   Sodium 139 135 - 145 mmol/L   Potassium 3.8 3.5 - 5.1 mmol/L   Chloride 105 98 - 111 mmol/L   CO2 23 22 - 32 mmol/L   Glucose, Bld 99 70 - 99 mg/dL   BUN 11 6 - 20 mg/dL   Creatinine, Ser 0.56 0.44 - 1.00 mg/dL   Calcium 9.4 8.9 - 10.3 mg/dL   GFR calc non Af Amer >60 >60 mL/min   GFR calc Af Amer >60 >60 mL/min    Comment: (NOTE) The eGFR has been calculated using the CKD EPI equation. This calculation has not been validated in all clinical situations. eGFR's persistently <60 mL/min signify possible Chronic Kidney Disease.    Anion gap 11 5 - 15    Comment: Performed at [facility], [address], [place], [place] [address]  Magnesium     Status: None   Collection Time: 10/31/17  8:21 PM  Result Value Ref Range   Magnesium 2.0 1.7 - 2.4 mg/dL    Comment: Performed at [facility], [address], [place], [place] [address]  TSH     Status: None   Collection Time: 10/31/17  8:21 PM  Result Value Ref Range   TSH 1.979 0.350 - 4.500 uIU/mL    Comment: Performed by a 3rd Generation assay with a functional sensitivity of <=0.01 uIU/mL. Performed at [facility], [address], [place], [place] [address]   Iron and TIBC     Status: None   Collection Time: 10/31/17  8:21 PM  Result Value Ref Range   Iron 64 28 - 170 ug/dL   TIBC 406 250 - 450 ug/dL   Saturation Ratios  16 10.4 - 31.8 %   UIBC 342 ug/dL    Comment: Performed at [place] [address], [place], [place] [address]  Ferritin (Iron Binding Protein)     Status: None   Collection Time: 10/31/17  8:21 PM  Result Value Ref Range   Ferritin 23 11 - 307 ng/mL    Comment: Performed at [facility], [place] [address], [place], [place] [address]  Comprehensive metabolic panel     Status: Abnormal   Collection Time: 12/29/17  1:55 AM  Result Value Ref Range   Sodium 138 135 - 145 mmol/L   Potassium 3.6 3.5 - 5.1  mmol/L   Chloride 108 98 - 111 mmol/L   CO2 25 22 - 32 mmol/L   Glucose, Bld 121 (H) 70 - 99 mg/dL   BUN 13 6 - 20 mg/dL   Creatinine, Ser 0.75 0.44 - 1.00 mg/dL   Calcium 8.9 8.9 - 10.3 mg/dL   Total Protein 7.1 6.5 - 8.1 g/dL   Albumin 4.2 3.5 - 5.0 g/dL   AST 15 15 - 41 U/L   ALT 10 0 - 44 U/L   Alkaline Phosphatase 40 38 - 126 U/L   Total Bilirubin 0.6 0.3 - 1.2 mg/dL   GFR calc non Af Amer >60 >60 mL/min   GFR calc Af Amer >60 >60 mL/min    Comment: (NOTE) The eGFR has been calculated using the CKD EPI equation. This calculation has not been validated in all clinical situations. eGFR's persistently <60 mL/min signify possible Chronic Kidney Disease.    Anion gap 5 5 - 15    Comment: Performed at [facility], [address], [place], [place] [address]  Troponin I     Status: None   Collection Time: 12/29/17  1:55 AM  Result Value Ref Range   Troponin I <0.03 <0.03 ng/mL    Comment: Performed at [facility], [address] [place], [place] [address]  Magnesium     Status: None   Collection Time: 12/29/17  1:55 AM  Result Value Ref Range   Magnesium 1.9 1.7 - 2.4 mg/dL    Comment: Performed at [facility], [address] [place], [place] [address]  TSH     Status: None   Collection Time: 12/29/17  7:35 AM  Result Value Ref Range   TSH 4.46 mIU/L    Comment:           Reference Range .           > or = 20 Years  0.40-4.50 .                Pregnancy  Ranges           First trimester    0.26-2.66           Second trimester   0.55-2.73           Third trimester    0.43-2.91   T4, free     Status: None   Collection Time: 12/29/17  7:35 AM  Result Value Ref Range   Free T4 1.1 0.8 - 1.8 ng/dL  T3, free     Status: None   Collection Time: 12/29/17  7:35 AM  Result Value Ref Range   T3, Free 3.5 2.3 - 4.2 pg/mL  Prolactin     Status: None   Collection Time: 12/29/17  7:35 AM  Result Value Ref Range   Prolactin 8.5 ng/mL    Comment:             Reference Range  Females         Non-pregnant        3.0-30.0         Pregnant           10.0-209.0         Postmenopausal      2.0-20.0 . Marland Kitchen Nino Glow, blood     Status: None   Collection Time: 12/29/17  7:35 AM  Result Value Ref Range   Cortisol - AM 16.7 mcg/dL    Comment: Reference Range 8 a.m. (7-9 a.m.) Specimen: 4.0-22.0 .  VITAMIN D 25 Hydroxy (Vit-D Deficiency, Fractures)     Status: Abnormal   Collection Time: 12/29/17  7:35 AM  Result Value Ref Range   Vit D, 25-Hydroxy 16 (L) 30 - 100 ng/mL    Comment: Vitamin D Status         25-OH Vitamin D: . Deficiency:                    <20 ng/mL Insufficiency:             20 - 29 ng/mL Optimal:                 > or = 30 ng/mL . For 25-OH Vitamin D testing on patients on  D2-supplementation and patients for whom quantitation  of D2 and D3 fractions is required, the QuestAssureD(TM) 25-OH VIT D, (D2,D3), LC/MS/MS is recommended: order  code [id number] (patients >36yr). . For more information on this test, go to: http://education.questdiagnostics.com/faq/FAQ163 (This link is being provided for  informational/educational purposes only.)      Assessment & Plan:   1. History of enlargement of pituitary gland 2. Moderate malnutrition ([place] 3.  Vitamin D deficiency   -Review of her endocrine records reveals prominent pituitary gland for age, measuring 8 mm craniocaudal, with no major tissue shifts .  Based on a  description of her pituitary CT and MRI, she may have shallow sella.  She will not require any intervention at this time.  -She will need pituitary/sella protocol MRI in 1 year to study the pituitary size in detail and comparison. -her functional endocrine work-up rules out thyroid , adrenal, dysfunction .  insulin like growth factor I still in process.  -Review of her records is consistent with hypoestrogenic state, currently on COC for menstrual irregularities.  This may put her at risk for osteopenia/osteoporosis or risk of never achieving peak bone mass.  She will need at least baseline bone density study.  At this time her insurance declined coverage.  She will be reconsidered for this study on subsequent visits.  - She has severe vitamin D deficiency despite ergocalciferol 50,000 units weekly.  I advised her to double this regimen to use 50,000 units twice weekly for the next 90 days.  - I advised her  to maintain close follow up with WMartin Majestic FNP for primary care needs.   KChestine Sporeparticipated in the discussions, expressed understanding, and voiced agreement with the above plans.  All questions were answered to her satisfaction. she is encouraged to contact clinic should she have any questions or concerns prior to her return visit.  Follow up plan: Return in about 6 months (around 07/03/2018) for Follow up with Pre-visit Labs.   GGlade Lloyd MD [facility] [address]Phone: [phone] Fax: [phone]    01/01/2018, 1:00 PM  This note was partially dictated with voice recognition software. Similar sounding words can be transcribed inadequately or may not  be corrected upon review.

## 2018-01-02 ENCOUNTER — Ambulatory Visit (INDEPENDENT_AMBULATORY_CARE_PROVIDER_SITE_OTHER): Payer: Medicare Other | Admitting: Cardiovascular Disease

## 2018-01-02 ENCOUNTER — Encounter: Payer: Self-pay | Admitting: Cardiovascular Disease

## 2018-01-02 VITALS — BP 128/68 | HR 128 | Ht 61.0 in | Wt 113.0 lb

## 2018-01-02 DIAGNOSIS — Z9289 Personal history of other medical treatment: Secondary | ICD-10-CM | POA: Diagnosis not present

## 2018-01-02 DIAGNOSIS — R000 Tachycardia, unspecified: Secondary | ICD-10-CM | POA: Diagnosis not present

## 2018-01-02 DIAGNOSIS — R002 Palpitations: Secondary | ICD-10-CM

## 2018-01-02 LAB — INSULIN-LIKE GROWTH FACTOR
IGF-I, LC/MS: 256 ng/mL (ref 63–373)
Z-Score (Female): 0.8 SD (ref ?–2.0)

## 2018-01-02 LAB — TSH: TSH: 4.46 mIU/L

## 2018-01-02 LAB — T3, FREE: T3, Free: 3.5 pg/mL (ref 2.3–4.2)

## 2018-01-02 LAB — VITAMIN D 25 HYDROXY (VIT D DEFICIENCY, FRACTURES): Vit D, 25-Hydroxy: 16 ng/mL — ABNORMAL LOW (ref 30–100)

## 2018-01-02 LAB — CORTISOL-AM, BLOOD: Cortisol - AM: 16.7 ug/dL

## 2018-01-02 LAB — PROLACTIN: Prolactin: 8.5 ng/mL

## 2018-01-02 LAB — T4, FREE: Free T4: 1.1 ng/dL (ref 0.8–1.8)

## 2018-01-02 NOTE — Progress Notes (Signed)
SUBJECTIVE: The patient presents for follow-up after being evaluated in the ED for palpitations and tachycardia. She was evaluated by EP on 09/30/2017 and it was suspected she had sinus tachycardia and PVCs.  It was recommended she increase her sodium intake so that she might be able to use propranolol to help improve palpitations.  Additional cardiac monitoring was not recommended.  He also mentioned that she likely had autonomic dysfunction.  Because of her inability to take multiple medications, Dr. Lovena Le was not inclined to recommend either midodrine or Florinef.  I last evaluated her on 11/04/2017 and she complained of a myriad of somatic symptoms.  She was evaluated in the ED on 12/28/2017.  Upon review of the EMR, she complained of chest pain and felt like her heart stopped.  ED physician notes mention the heart rate was 87 bpm and after the patient sat up it increased to 115 bpm.  O2 sats were normal.  I personally reviewed the ECG which demonstrated sinus tachycardia, 122 bpm.  ECG performed in the office today which I ordered and personally interpreted demonstrates sinus tachycardia, 111 bpm.  After having a blood draw in the [facility] ED, her right forearm and antecubital fossa are bruised and she complains of pain and tenderness.  She has been applying ice packs.  She apparently had an ultrasound at her PCPs office and was told she had superficial vein leg clot and the needle may have injured a nerve or tendon.  She saw an endocrinologist locally but was dissatisfied with treatment as her TSH was reportedly in the normal range and he just wanted to repeat the value in 6 months.  She apparently saw a rheumatologist who said she does not have an autoimmune disorder.    Review of Systems: As per "subjective", otherwise negative.  Allergies  Allergen Reactions  . Adhesive [Tape] Other (See Comments)    Eats skin almost instantly  . Blue Dyes (Parenteral) Other  (See Comments)    BLUE DYES IS OK BY ITSELF, BUT CANNOT BE COMBINED WITH GREEN DYES  . Contrast Media [Iodinated Diagnostic Agents] Nausea And Vomiting    Vomiting up blood, shaking and tremors uncontrollable, dizziness and lightheadness  . Corn-Containing Products Anaphylaxis and Nausea And Vomiting    ALSO GRASS FAMILY PLANTS, VEGETABLES, ETC.   . Green Dyes Other (See Comments)    GREEN DYE IS OK BY ITSELF, BUT CANNOT BE COMBINED WITH BLUE DYE  . Nickel Itching, Swelling, Rash and Other (See Comments)  . Onion     Cannot be around onion at all or she has breathing difficulty  . Other Nausea Only, Rash and Other (See Comments)    Sunlight causes extreme fatigue , Migraine , Cat Dander  . Petroleum Distillate Itching, Swelling and Rash  . Silicone Rash    rash  . Cortisporin [Bacitra-Neomycin-Polymyxin-Hc]     Otic 1% suspension- causes hearing loss and pain.  . Yellow Jacket Venom Hives    Severe disorientation    Current Outpatient Medications  Medication Sig Dispense Refill  . ADVAIR DISKUS 500-50 MCG/DOSE AEPB Inhale 1 puff into the lungs 2 (two) times daily as needed (for shortness of breath).   12  . albuterol (PROVENTIL HFA;VENTOLIN HFA) 108 (90 Base) MCG/ACT inhaler Inhale into the lungs every 6 (six) hours as needed for wheezing or shortness of breath.    . baclofen (LIORESAL) 10 MG tablet Take 10 mg by mouth 2 (two) times daily as  needed for muscle spasms.   1  . D3-50 50000 units capsule Take 1 capsule 2 x a week 48 capsule 1  . diclofenac sodium (VOLTAREN) 1 % GEL Apply 4 g topically 4 (four) times daily.   5  . EPINEPHrine 0.3 mg/0.3 mL IJ SOAJ injection Inject 0.3 mg into the muscle once. As needed for allergic reaction    . gabapentin (NEURONTIN) 300 MG capsule Take 300 mg by mouth 3 (three) times daily as needed (pain).   2  . hydrOXYzine (ATARAX/VISTARIL) 25 MG tablet Take 25 mg by mouth 3 (three) times daily as needed (for allergies).     Marland Kitchen levalbuterol (XOPENEX  HFA) 45 MCG/ACT inhaler Inhale 2 puffs into the lungs every 8 (eight) hours as needed for wheezing or shortness of breath.     . levalbuterol (XOPENEX) 1.25 MG/3ML nebulizer solution Take 1.25 mg by nebulization every 8 (eight) hours as needed for wheezing or shortness of breath.   12  . nitroGLYCERIN (NITROSTAT) 0.4 MG SL tablet Place 1 tablet (0.4 mg total) under the tongue every 5 (five) minutes as needed for chest pain. 25 tablet 12  . NONFORMULARY OR COMPOUNDED ITEM Apply 1 application topically 4 (four) times daily. Katamine/Gabapentin/Baclofen/Lidocaine/Menthol Cream    . norgestimate-ethinyl estradiol (ESTARYLLA) 0.25-35 MG-MCG tablet Take 1 tablet by mouth daily.    . propranolol (INDERAL) 20 MG/5ML solution Take 2.5 mLs (10 mg total) by mouth 2 (two) times daily as needed. 200 mL 3  . RESTASIS MULTIDOSE 0.05 % ophthalmic emulsion Place 1 drop into both eyes 2 (two) times daily.   3   No current facility-administered medications for this visit.     Past Medical History:  Diagnosis Date  . Agoraphobia   . Anxiety   . Bipolar 1 disorder ([place])   . Chronic abdominal pain   . Chronic chest pain   . Depression   . PTSD (post-traumatic stress disorder)   . Scoliosis     Past Surgical History:  Procedure Laterality Date  . MOUTH SURGERY      Social History   Socioeconomic History  . Marital status: Single    Spouse name: Not on file  . Number of children: Not on file  . Years of education: Not on file  . Highest education level: Not on file  Occupational History  . Not on file  Social Needs  . Financial resource strain: Not on file  . Food insecurity:    Worry: Not on file    Inability: Not on file  . Transportation needs:    Medical: Not on file    Non-medical: Not on file  Tobacco Use  . Smoking status: Former Smoker    Packs/day: 0.01    Years: 2.00    Pack years: 0.02    Types: Cigarettes    Last attempt to quit: 02/14/2014    Years since quitting: 3.8  .  Smokeless tobacco: Never Used  . Tobacco comment: rare, social smoker  Substance and Sexual Activity  . Alcohol use: Yes    Alcohol/week: 0.0 standard drinks    Comment: occ  . Drug use: No  . Sexual activity: Not on file  Lifestyle  . Physical activity:    Days per week: Not on file    Minutes per session: Not on file  . Stress: Not on file  Relationships  . Social connections:    Talks on phone: Not on file    Gets together: Not on file  Attends religious service: Not on file    Active member of club or organization: Not on file    Attends meetings of clubs or organizations: Not on file    Relationship status: Not on file  . Intimate partner violence:    Fear of current or ex partner: Not on file    Emotionally abused: Not on file    Physically abused: Not on file    Forced sexual activity: Not on file  Other Topics Concern  . Not on file  Social History Narrative  . Not on file     Vitals:   01/02/18 1248  BP: 128/68  Pulse: (!) 128  SpO2: 94%  Weight: 113 lb (51.3 kg)  Height: _0  (1.549 m)    Wt Readings from Last 3 Encounters:  01/02/18 113 lb (51.3 kg)  01/01/18 114 lb (51.7 kg)  12/28/17 114 lb (51.7 kg)     PHYSICAL EXAM General: NAD HEENT: Normal. Neck: No JVD, no thyromegaly. Lungs: Clear to auscultation bilaterally with normal respiratory effort. CV: Tachycardic, regular rhythm, normal S1/S2, no S3/S4, no murmur. No pretibial or periankle edema.     Abdomen: Soft, nontender, no distention.  Neurologic: Alert and oriented.  Psych: Normal affect. Skin: Normal. Musculoskeletal: No gross deformities.    ECG: Reviewed above under Subjective   Labs: Lab Results  Component Value Date/Time   K 3.6 12/29/2017 01:55 AM   K 3.8 10/26/2012 01:12 AM   BUN 13 12/29/2017 01:55 AM   BUN 12 10/26/2012 01:12 AM   CREATININE 0.75 12/29/2017 01:55 AM   CREATININE 0.74 10/26/2012 01:12 AM   ALT 10 12/29/2017 01:55 AM   ALT 14 10/26/2012 01:12 AM     TSH 4.46 12/29/2017 07:35 AM   HGB 13.7 10/31/2017 08:21 PM   HGB 13.4 10/26/2012 01:12 AM     Lipids: No results found for: LDLCALC, LDLDIRECT, CHOL, TRIG, HDL     ASSESSMENT AND PLAN:  1.  Tachycardia: See discussion above.  Already evaluated by EP and no further testing was indicated.  It is suspected she has sinus tachycardia with PVCs and autonomic dysfunction.  Increased sodium intake was recommended by EP so that she might be able to take her propranolol.   It does not appear she has an inappropriate sinus tachycardia as I feel there are multiple other reasons for her heart rate to be elevated.  During my physical exam after speaking with her, her heart rate normalized.  No cardiac testing or cardiovascular medications are indicated at this time.   Disposition: Follow up with me as needed.  I recommended she potentially be referred to a [facility] by her PCP.  Time spent: 40 minutes, of which greater than 50% was spent reviewing symptoms, relevant blood tests and studies, and discussing management plan with the patient.    Kate Sable, M.D., F.A.C.C.

## 2018-01-02 NOTE — Patient Instructions (Signed)
Medication Instructions:   Your physician recommends that you continue on your current medications as directed. Please refer to the Current Medication list given to you today.  Labwork:  none  Testing/Procedures:  none  Follow-Up:  Your physician recommends that you schedule a follow-up appointment in: as needed.  Any Other Special Instructions Will Be Listed Below (If Applicable).  If you need a refill on your cardiac medications before your next appointment, please call your pharmacy. 

## 2018-01-16 DIAGNOSIS — E559 Vitamin D deficiency, unspecified: Secondary | ICD-10-CM | POA: Diagnosis not present

## 2018-01-16 DIAGNOSIS — E7800 Pure hypercholesterolemia, unspecified: Secondary | ICD-10-CM | POA: Diagnosis not present

## 2018-01-16 DIAGNOSIS — E876 Hypokalemia: Secondary | ICD-10-CM | POA: Diagnosis not present

## 2018-01-16 DIAGNOSIS — R5383 Other fatigue: Secondary | ICD-10-CM | POA: Diagnosis not present

## 2018-01-16 DIAGNOSIS — F5089 Other specified eating disorder: Secondary | ICD-10-CM | POA: Diagnosis not present

## 2018-01-21 DIAGNOSIS — G908 Other disorders of autonomic nervous system: Secondary | ICD-10-CM | POA: Diagnosis not present

## 2018-01-21 DIAGNOSIS — R000 Tachycardia, unspecified: Secondary | ICD-10-CM | POA: Diagnosis not present

## 2018-02-09 DIAGNOSIS — J4550 Severe persistent asthma, uncomplicated: Secondary | ICD-10-CM | POA: Diagnosis not present

## 2018-02-09 DIAGNOSIS — I10 Essential (primary) hypertension: Secondary | ICD-10-CM | POA: Diagnosis not present

## 2018-02-09 DIAGNOSIS — J9611 Chronic respiratory failure with hypoxia: Secondary | ICD-10-CM | POA: Diagnosis not present

## 2018-02-09 DIAGNOSIS — I471 Supraventricular tachycardia: Secondary | ICD-10-CM | POA: Diagnosis not present

## 2018-02-17 DIAGNOSIS — M5416 Radiculopathy, lumbar region: Secondary | ICD-10-CM | POA: Diagnosis not present

## 2018-02-17 DIAGNOSIS — F4001 Agoraphobia with panic disorder: Secondary | ICD-10-CM | POA: Diagnosis not present

## 2018-02-17 DIAGNOSIS — E649 Sequelae of unspecified nutritional deficiency: Secondary | ICD-10-CM | POA: Diagnosis not present

## 2018-02-17 DIAGNOSIS — M4134 Thoracogenic scoliosis, thoracic region: Secondary | ICD-10-CM | POA: Diagnosis not present

## 2018-02-23 DIAGNOSIS — R000 Tachycardia, unspecified: Secondary | ICD-10-CM | POA: Diagnosis not present

## 2018-02-23 DIAGNOSIS — F419 Anxiety disorder, unspecified: Secondary | ICD-10-CM | POA: Diagnosis not present

## 2018-02-23 DIAGNOSIS — R079 Chest pain, unspecified: Secondary | ICD-10-CM | POA: Diagnosis not present

## 2018-02-23 DIAGNOSIS — R002 Palpitations: Secondary | ICD-10-CM | POA: Diagnosis not present

## 2018-02-24 DIAGNOSIS — H6061 Unspecified chronic otitis externa, right ear: Secondary | ICD-10-CM | POA: Diagnosis not present

## 2018-02-24 DIAGNOSIS — J029 Acute pharyngitis, unspecified: Secondary | ICD-10-CM | POA: Diagnosis not present

## 2018-03-14 ENCOUNTER — Emergency Department (HOSPITAL_COMMUNITY): Payer: Medicare Other

## 2018-03-14 ENCOUNTER — Other Ambulatory Visit: Payer: Self-pay

## 2018-03-14 ENCOUNTER — Emergency Department (HOSPITAL_COMMUNITY)
Admission: EM | Admit: 2018-03-14 | Discharge: 2018-03-14 | Disposition: A | Discharge status: Home / Self Care | Payer: Medicare Other | Attending: Emergency Medicine | Admitting: Emergency Medicine

## 2018-03-14 ENCOUNTER — Encounter (HOSPITAL_COMMUNITY): Payer: Self-pay | Admitting: Emergency Medicine

## 2018-03-14 DIAGNOSIS — Z87891 Personal history of nicotine dependence: Secondary | ICD-10-CM | POA: Insufficient documentation

## 2018-03-14 DIAGNOSIS — Z79899 Other long term (current) drug therapy: Secondary | ICD-10-CM | POA: Diagnosis not present

## 2018-03-14 DIAGNOSIS — M79671 Pain in right foot: Secondary | ICD-10-CM | POA: Insufficient documentation

## 2018-03-14 DIAGNOSIS — M19071 Primary osteoarthritis, right ankle and foot: Secondary | ICD-10-CM | POA: Diagnosis not present

## 2018-03-14 MED ORDER — IBUPROFEN 600 MG PO TABS: 600.0000 mg | ORAL_TABLET | Freq: Three times a day (TID) | ORAL | 0 refills | Status: DC | PRN

## 2018-03-14 MED ORDER — IBUPROFEN 800 MG PO TABS: 800.0000 mg | ORAL_TABLET | Freq: Three times a day (TID) | ORAL | 0 refills | Status: DC

## 2018-03-14 MED ORDER — IBUPROFEN 400 MG PO TABS
400.0000 mg | ORAL_TABLET | Freq: Once | ORAL | Status: DC
  Filled 2018-03-14: qty 1

## 2018-03-14 NOTE — ED Triage Notes (Signed)
Pt C/O right foot pain that started "a few hours ago." Pt denies injury.

## 2018-03-14 NOTE — Discharge Instructions (Signed)
Wear the postop shoe for at least 1 week when weightbearing.  Elevate and apply ice packs on and off to your foot.  Call the podiatrist (foot doctor) listed to arrange a follow-up appointment if your symptoms are not improving.

## 2018-03-16 NOTE — ED Provider Notes (Signed)
[facility] EMERGENCY DEPARTMENT Provider Note   CSN: [id number] Arrival date & time: 03/14/18  1920     History   Chief Complaint Chief Complaint  Patient presents with  . Foot Pain    HPI Traci Hensley is a 28 y.o. female.  HPI   Traci Hensley is a 28 y.o. female who presents to the Emergency Department complaining of right foot pain for two hours. She reports chronic pain to her right foot and noticed increasing pain to the "ball" of her foot.  Pain is described as throbbing and worse with pressure and weight bearing.  She denies known injury, swelling, redness and open wound.  Denies pain proximal to the foot.    Past Medical History:  Diagnosis Date  . Agoraphobia   . Anxiety   . Bipolar 1 disorder ([place])   . Chronic abdominal pain   . Chronic chest pain   . Depression   . PTSD (post-traumatic stress disorder)   . Scoliosis     Patient Active Problem List   Diagnosis Date Noted  . History of enlargement of pituitary gland 12/25/2017  . Moderate malnutrition ([place]) 12/25/2017  . Loss of weight 12/25/2017  . Vitamin D deficiency 12/25/2017  . Ingrown toenail 11/23/2015  . Dyspepsia 08/31/2014  . Abdominal pain 06/29/2014  . Achilles tendinitis of both lower extremities 01/23/2014  . Palpitations 04/19/2013  . Chest pain 04/19/2013  . Sinus tachycardia 04/19/2013  . Anxiety 04/19/2013    Past Surgical History:  Procedure Laterality Date  . MOUTH SURGERY       OB History   No obstetric history on file.      Home Medications    Prior to Admission medications   Medication Sig Start Date End Date Taking? Authorizing Provider  ADVAIR DISKUS 500-50 MCG/DOSE AEPB Inhale 1 puff into the lungs 2 (two) times daily as needed (for shortness of breath).  07/25/14   [provider]  albuterol (PROVENTIL HFA;VENTOLIN HFA) 108 (90 Base) MCG/ACT inhaler Inhale into the lungs every 6 (six) hours as needed for wheezing or shortness of breath.    [provider]  baclofen (LIORESAL) 10 MG tablet Take 10 mg by mouth 2 (two) times daily as needed for muscle spasms.  08/09/17   [provider]  D3-50 50000 units capsule Take 1 capsule 2 x a week 01/01/18   Cassandria Anger, MD  diclofenac sodium (VOLTAREN) 1 % GEL Apply 4 g topically 4 (four) times daily.  08/09/17   [provider]  EPINEPHrine 0.3 mg/0.3 mL IJ SOAJ injection Inject 0.3 mg into the muscle once. As needed for allergic reaction    [provider]  gabapentin (NEURONTIN) 300 MG capsule Take 300 mg by mouth 3 (three) times daily as needed (pain).  06/03/16   [provider]  hydrOXYzine (ATARAX/VISTARIL) 25 MG tablet Take 25 mg by mouth 3 (three) times daily as needed (for allergies).  07/19/17   [provider]  ibuprofen (ADVIL,MOTRIN) 600 MG tablet Take 1 tablet (600 mg total) by mouth every 8 (eight) hours as needed. 03/14/18   , , PA-C  levalbuterol (XOPENEX HFA) 45 MCG/ACT inhaler Inhale 2 puffs into the lungs every 8 (eight) hours as needed for wheezing or shortness of breath.  12/17/16   [provider]  levalbuterol Penne Lash) 1.25 MG/3ML nebulizer solution Take 1.25 mg by nebulization every 8 (eight) hours as needed for wheezing or shortness of breath.  06/06/17  [provider]  nitroGLYCERIN (NITROSTAT) 0.4 MG SL tablet Place 1 tablet (0.4 mg total) under the tongue every 5 (five) minutes as needed for chest pain. 06/17/14   Jerline Pain, MD  NONFORMULARY OR COMPOUNDED ITEM Apply 1 application topically 4 (four) times daily. Katamine/Gabapentin/Baclofen/Lidocaine/Menthol Cream    [provider]  norgestimate-ethinyl estradiol (ESTARYLLA) 0.25-35 MG-MCG tablet Take 1 tablet by mouth daily.    [provider]  propranolol (INDERAL) 20 MG/5ML solution Take 2.5 mLs (10 mg total) by mouth 2 (two) times daily as needed. 12/06/16   Lendon Colonel, NP  RESTASIS MULTIDOSE 0.05 % ophthalmic  emulsion Place 1 drop into both eyes 2 (two) times daily.  08/09/17   [provider]    Family History Family History  Problem Relation Age of Onset  . Arrhythmia Mother   . Colon cancer Neg Hx     Social History Social History   Tobacco Use  . Smoking status: Former Smoker    Packs/day: 0.01    Years: 2.00    Pack years: 0.02    Types: Cigarettes    Last attempt to quit: 02/14/2014    Years since quitting: 4.0  . Smokeless tobacco: Never Used  . Tobacco comment: rare, social smoker  Substance Use Topics  . Alcohol use: Yes    Alcohol/week: 0.0 standard drinks    Comment: occ  . Drug use: No     Allergies   Adhesive [tape]; Blue dyes (parenteral); Contrast media [iodinated diagnostic agents]; Corn-containing products; Green dyes; Nickel; Onion; Other; Petroleum distillate; Silicone; Cortisporin [bacitra-neomycin-polymyxin-hc]; and Yellow jacket venom   Review of Systems Review of Systems  Constitutional: Negative for chills and fever.  Musculoskeletal: Positive for arthralgias (right foot pain). Negative for joint swelling.  Skin: Negative for color change, rash and wound.  Neurological: Negative for weakness and numbness.     Physical Exam Updated Vital Signs BP 118/75 (BP Location: Right Arm)   Pulse 97   Temp 98.5 F (36.9 C) (Oral)   Resp 16   Ht 5\' 1"  (1.549 m)   Wt 52.2 kg   SpO2 100%   BMI 21.73 kg/m   Physical Exam Vitals signs and nursing note reviewed.  Constitutional:      General: She is not in acute distress.    Appearance: She is not toxic-appearing.  Cardiovascular:     Rate and Rhythm: Normal rate.     Pulses: Normal pulses.  Pulmonary:     Breath sounds: Normal breath sounds.  Musculoskeletal:        General: Tenderness present. No swelling, deformity or signs of injury.       Feet:     Comments: Focal ttp of the distal right foot.  No obvious edema.  No open wounds, erythema.  Web spaces are nml appearing. Sensation  intact, proximal foot and ankle are non-tender.    Skin:    Capillary Refill: Capillary refill takes less than 2 seconds.     Findings: No erythema or rash.  Neurological:     General: No focal deficit present.     Mental Status: She is alert.     Sensory: No sensory deficit.     Motor: No weakness.      ED Treatments / Results  Labs (all labs ordered are listed, but only abnormal results are displayed) Labs Reviewed - No data to display  EKG None  Radiology Dg Foot Complete Right  Result Date: 03/14/2018 CLINICAL DATA:  Right foot pain.  No known injury. EXAM: RIGHT FOOT COMPLETE - 3+ VIEW COMPARISON:  Radiograph 11/14/2015 FINDINGS: There is no evidence of fracture or dislocation. There is no evidence of arthropathy or other focal bone abnormality. Soft tissues are unremarkable. IMPRESSION: Negative radiographs of the foot. Electronically Signed   By: Keith Rake M.D.   On: 03/14/2018 20:12    Procedures Procedures (including critical care time)  Medications Ordered in ED Medications - No data to display   Initial Impression / Assessment and Plan / ED Course  I have reviewed the triage vital signs and the nursing notes.  Pertinent labs & imaging results that were available during my care of the patient were reviewed by me and considered in my medical decision making (see chart for details).     Pt with few hours of pain to right foot, denies injuries.  No skin changes.  XR neg for bony injury.  NV intact.  Offered post op shoe, but patient refused.  Patient requested cam walker, but without bony injury, I do not feel that cam walker is indicated.  patient given crutches.  She has podiatrist that she prefers to f/u with  Final Clinical Impressions(s) / ED Diagnoses   Final diagnoses:  Foot pain, right    ED Discharge Orders         Ordered    ibuprofen (ADVIL,MOTRIN) 800 MG tablet  3 times daily,   Status:  Discontinued     03/14/18 2019    ibuprofen  (ADVIL,MOTRIN) 600 MG tablet  Every 8 hours PRN     03/14/18 2022           Kem Parkinson, PA-C 03/16/18 1230    Nat Christen, MD 03/20/18 [phone]

## 2018-03-19 ENCOUNTER — Ambulatory Visit (INDEPENDENT_AMBULATORY_CARE_PROVIDER_SITE_OTHER): Payer: Medicare Other | Admitting: Podiatry

## 2018-03-19 DIAGNOSIS — D3610 Benign neoplasm of peripheral nerves and autonomic nervous system, unspecified: Secondary | ICD-10-CM

## 2018-03-19 DIAGNOSIS — M779 Enthesopathy, unspecified: Secondary | ICD-10-CM

## 2018-03-19 MED ORDER — SILVER SULFADIAZINE 1 % EX CREA: 1.0000 "application " | TOPICAL_CREAM | Freq: Every day | CUTANEOUS | 0 refills | Status: DC

## 2018-03-19 MED ORDER — METHYLPREDNISOLONE 4 MG PO TBPK: ORAL_TABLET | ORAL | 0 refills | Status: DC

## 2018-03-23 DIAGNOSIS — R000 Tachycardia, unspecified: Secondary | ICD-10-CM | POA: Diagnosis not present

## 2018-03-23 DIAGNOSIS — R079 Chest pain, unspecified: Secondary | ICD-10-CM | POA: Diagnosis not present

## 2018-03-23 DIAGNOSIS — F419 Anxiety disorder, unspecified: Secondary | ICD-10-CM | POA: Diagnosis not present

## 2018-03-23 DIAGNOSIS — R002 Palpitations: Secondary | ICD-10-CM | POA: Diagnosis not present

## 2018-03-25 NOTE — Progress Notes (Signed)
Subjective: 28 year old female presents the office today for concerns of pain and swelling to the right forefoot.  She is actually gone to the ER for this and she was told to wear surgical shoe but because of her ankle issues that she has had previously she states that she feels that she did break her ankle to wear this.  She feels that she is stepped on the ball of her foot.  She is get some similar sensations in the left foot over last 2 days but not as significant.  She denies any recent injury or trauma. Denies any systemic complaints such as fevers, chills, nausea, vomiting. No acute changes since last appointment, and no other complaints at this time.   Objective: AAO x3, NAD DP/PT pulses palpable bilaterally, CRT less than 3 seconds There is mild swelling on the forefoot.  There is tenderness palpation on the third interspace she is getting some numbness into the third fourth toes.  No significant palpable neuroma is identified at this time.  There is no area pinpoint bony tenderness or pain to vibratory sensation.  Some swelling on the MPJs. No open lesions or pre-ulcerative lesions.  No pain with calf compression, swelling, warmth, erythema  Assessment: Right foot capsulitis, concern for neuroma  Plan: -All treatment options discussed with the patient including all alternatives, risks, complications.  -She declined injection.  Ordered a Medrol Dosepak.  She declined surgical shoe. I dispensed gel metatarsal pads and we discussed wearing a stiffer sole shoe.  -Patient encouraged to call the office with any questions, concerns, change in symptoms.   Return in about 6 weeks (around 04/30/2018), or if symptoms worsen or fail to improve.  Trula Slade DPM

## 2018-03-31 DIAGNOSIS — F4001 Agoraphobia with panic disorder: Secondary | ICD-10-CM | POA: Diagnosis not present

## 2018-03-31 DIAGNOSIS — M4134 Thoracogenic scoliosis, thoracic region: Secondary | ICD-10-CM | POA: Diagnosis not present

## 2018-03-31 DIAGNOSIS — E649 Sequelae of unspecified nutritional deficiency: Secondary | ICD-10-CM | POA: Diagnosis not present

## 2018-03-31 DIAGNOSIS — M5416 Radiculopathy, lumbar region: Secondary | ICD-10-CM | POA: Diagnosis not present

## 2018-04-07 ENCOUNTER — Ambulatory Visit (INDEPENDENT_AMBULATORY_CARE_PROVIDER_SITE_OTHER): Payer: Medicare Other | Admitting: Podiatry

## 2018-04-07 ENCOUNTER — Ambulatory Visit (INDEPENDENT_AMBULATORY_CARE_PROVIDER_SITE_OTHER): Payer: Medicare Other

## 2018-04-07 DIAGNOSIS — R2241 Localized swelling, mass and lump, right lower limb: Secondary | ICD-10-CM

## 2018-04-07 DIAGNOSIS — M84374A Stress fracture, right foot, initial encounter for fracture: Secondary | ICD-10-CM

## 2018-04-07 DIAGNOSIS — M779 Enthesopathy, unspecified: Secondary | ICD-10-CM

## 2018-04-08 ENCOUNTER — Telehealth: Payer: Self-pay | Admitting: *Deleted

## 2018-04-08 DIAGNOSIS — M779 Enthesopathy, unspecified: Secondary | ICD-10-CM

## 2018-04-08 DIAGNOSIS — T148XXA Other injury of unspecified body region, initial encounter: Secondary | ICD-10-CM

## 2018-04-08 DIAGNOSIS — M84374A Stress fracture, right foot, initial encounter for fracture: Secondary | ICD-10-CM

## 2018-04-08 NOTE — Progress Notes (Signed)
Subjective: 28 year old female presents the office today for follow-up evaluation of right foot swelling and discomfort.  Overall she states that she still has that she is walking on a ball and she states the swelling and it improved with the Medrol Dosepak however when she finishes it came back and has moved and she points more to submetatarsal 5 where she is getting majority discomfort at this time she is getting diffuse tenderness on the ball of her foot.  She said that she is try not to put any weight on her foot.  She states the pain is worse after walking more.  No recent injury. Denies any systemic complaints such as fevers, chills, nausea, vomiting. No acute changes since last appointment, and no other complaints at this time.   Objective: AAO x3, NAD DP/PT pulses palpable bilaterally, CRT less than 3 seconds There is swelling submetatarsal 2 through 5 on the plantar aspect the right foot.  There is tenderness palpation submetatarsal area as well.  There is no pain on the dorsal metatarsals.  Unable to palpate a neuroma today.  No pain in the ankle. No open lesions or pre-ulcerative lesions.  No pain with calf compression, swelling, warmth, erythema  Assessment: Right ongoing forefoot swelling, rule out stress fracture, tear  Plan: -All treatment options discussed with the patient including all alternatives, risks, complications.  -Repeat x-rays were obtained reviewed which not reveal any evidence of acute fracture. -Given she has had 2 sets of x-rays from myself as well as the emergency room without any significant relief in her symptoms I will order an MRI of the neck shows no stress fracture or osteoporotic tendon tear.  A cam boot was dispensed today.  Encouraged ice elevation.  Continue anti-inflammatories as needed. -Patient encouraged to call the office with any questions, concerns, change in symptoms.   Trula Slade DPM

## 2018-04-08 NOTE — Telephone Encounter (Signed)
-----   Message from Trula Slade, DPM sent at 04/07/2018  5:22 PM EST ----- Can you please order an MRI of the right foot? She has had 2 sets of x-rays which are negative and continued pain and swelling to the forefoot. This is to rule out stress fracture/tendon tear.

## 2018-04-22 ENCOUNTER — Ambulatory Visit
Admission: RE | Admit: 2018-04-22 | Discharge: 2018-04-22 | Disposition: A | Discharge status: Home / Self Care | Payer: Medicare Other | Source: Ambulatory Visit | Attending: Podiatry | Admitting: Podiatry

## 2018-04-22 DIAGNOSIS — T148XXA Other injury of unspecified body region, initial encounter: Secondary | ICD-10-CM

## 2018-04-22 DIAGNOSIS — R600 Localized edema: Secondary | ICD-10-CM | POA: Diagnosis not present

## 2018-04-22 DIAGNOSIS — M25475 Effusion, left foot: Secondary | ICD-10-CM | POA: Diagnosis not present

## 2018-04-22 DIAGNOSIS — M84374A Stress fracture, right foot, initial encounter for fracture: Secondary | ICD-10-CM

## 2018-04-22 DIAGNOSIS — M779 Enthesopathy, unspecified: Secondary | ICD-10-CM

## 2018-04-24 ENCOUNTER — Telehealth: Payer: Self-pay | Admitting: *Deleted

## 2018-04-24 NOTE — Telephone Encounter (Signed)
-----   Message from Trula Slade, DPM sent at 04/24/2018  9:32 AM EST ----- Val- please let her know that the MRI shows swelling in the bones in all of her toes. This is likely from stress to the area. I doubt any infection. Have her remain in the CAM boot and please have her follow up in about 2 weeks for repeat x-rays. Thanks.

## 2018-04-24 NOTE — Telephone Encounter (Signed)
I informed pt's POA, Marijo of Dr. Leigh Aurora review of results and orders. Transferred Marijo to schedulers.

## 2018-04-30 ENCOUNTER — Ambulatory Visit: Payer: Medicare Other | Admitting: Podiatry

## 2018-05-05 ENCOUNTER — Ambulatory Visit (INDEPENDENT_AMBULATORY_CARE_PROVIDER_SITE_OTHER): Payer: Medicare Other | Admitting: Podiatry

## 2018-05-05 ENCOUNTER — Other Ambulatory Visit: Payer: Self-pay | Admitting: Podiatry

## 2018-05-05 ENCOUNTER — Ambulatory Visit (INDEPENDENT_AMBULATORY_CARE_PROVIDER_SITE_OTHER): Payer: Medicare Other

## 2018-05-05 DIAGNOSIS — M84374G Stress fracture, right foot, subsequent encounter for fracture with delayed healing: Secondary | ICD-10-CM

## 2018-05-05 DIAGNOSIS — M84374A Stress fracture, right foot, initial encounter for fracture: Secondary | ICD-10-CM | POA: Diagnosis not present

## 2018-05-05 DIAGNOSIS — R609 Edema, unspecified: Secondary | ICD-10-CM

## 2018-05-05 DIAGNOSIS — T148XXA Other injury of unspecified body region, initial encounter: Secondary | ICD-10-CM

## 2018-05-05 NOTE — Progress Notes (Signed)
Subjective: 28 year old female presents the office today for follow-up evaluation of right foot pain discussed the MRI results.  Overall she states that she still having discomfort but when she wears the cam boot it helps her walk and she is not having much pressure and pain to the foot.  She has noticed a bruise forming the top of the right foot as well.  She denies recent injury or fall.  She states that she is getting some pain to the ball of her left foot as she has been compensating.  No injury to the left foot.  Denies any systemic complaints such as fevers, chills, nausea, vomiting. No acute changes since last appointment, and no other complaints at this time.   Objective: AAO x3, NAD DP/PT pulses palpable bilaterally, CRT less than 3 seconds There is a bruise present on the dorsal aspect of the forefoot and mild tenderness palpation of this area.  There is also continued tenderness palpation of the metatarsal areas most of the toes.  There is mild swelling but not significant.  There is no erythema or warmth.  Mild discomfort of the left foot. No open lesions or pre-ulcerative lesions.  No pain with calf compression, swelling, warmth, erythema  Assessment: Stress reaction right foot  Plan: -All treatment options discussed with the patient including all alternatives, risks, complications.  -New x-rays were obtained reviewed.  There is some radiodensities present in the toes but there is no evidence of acute fracture. -I reviewed the MRI with her.  I think this is more of a bone marrow syndrome, stress reaction.  I doubt infection is no clinical signs of infection no open sores.  For now I want her to remain in the cam boot.  I did apply an Haematologist today.  Also we can consider bone scan if no improvement in symptoms for further evaluation. -Patient encouraged to call the office with any questions, concerns, change in symptoms.   Trula Slade DPM

## 2018-05-19 ENCOUNTER — Ambulatory Visit (INDEPENDENT_AMBULATORY_CARE_PROVIDER_SITE_OTHER): Payer: Medicare Other

## 2018-05-19 ENCOUNTER — Ambulatory Visit (INDEPENDENT_AMBULATORY_CARE_PROVIDER_SITE_OTHER): Payer: Medicare Other | Admitting: Podiatry

## 2018-05-19 DIAGNOSIS — M84377D Stress fracture, right toe(s), subsequent encounter for fracture with routine healing: Secondary | ICD-10-CM

## 2018-05-19 DIAGNOSIS — M779 Enthesopathy, unspecified: Secondary | ICD-10-CM | POA: Diagnosis not present

## 2018-05-19 DIAGNOSIS — M79671 Pain in right foot: Secondary | ICD-10-CM

## 2018-05-19 DIAGNOSIS — M84374A Stress fracture, right foot, initial encounter for fracture: Secondary | ICD-10-CM

## 2018-05-19 DIAGNOSIS — M79672 Pain in left foot: Secondary | ICD-10-CM

## 2018-05-19 DIAGNOSIS — M84376A Stress fracture, unspecified foot, initial encounter for fracture: Secondary | ICD-10-CM

## 2018-05-19 DIAGNOSIS — D7589 Other specified diseases of blood and blood-forming organs: Secondary | ICD-10-CM

## 2018-05-19 DIAGNOSIS — R937 Abnormal findings on diagnostic imaging of other parts of musculoskeletal system: Secondary | ICD-10-CM

## 2018-05-20 ENCOUNTER — Other Ambulatory Visit: Payer: Self-pay | Admitting: Podiatry

## 2018-05-20 DIAGNOSIS — M84376A Stress fracture, unspecified foot, initial encounter for fracture: Secondary | ICD-10-CM

## 2018-05-28 ENCOUNTER — Telehealth: Payer: Self-pay | Admitting: Podiatry

## 2018-05-28 NOTE — Telephone Encounter (Signed)
I informed Traci Hensley the unna boots were $40.00 for 4 and our office manager would bill pt and someone could bring them to her in the car. Traci Hensley states she will discuss with pt.

## 2018-05-28 NOTE — Telephone Encounter (Signed)
Patient wanted to know if we could mail out the materials for the soft cast to avoid coming to the office because her and her daughter have respiratory issues. Please call patient

## 2018-05-28 NOTE — Progress Notes (Signed)
Subjective: 28 year old female presents the office today for follow-up evaluation of right foot pain.  She states that the boot did help quite a bit.  She still in the cam boot as well.  Overall her symptoms have improved on the right side.  They are unchanged in the left side and only expressing mild discomfort to the toe area in the left side no swelling.  She had a bruise on the top of the right foot previously and this is almost resolved she states as well.  She tries the off of her foot. Denies any systemic complaints such as fevers, chills, nausea, vomiting. No acute changes since last appointment, and no other complaints at this time.   Objective: AAO x3, NAD DP/PT pulses palpable bilaterally, CRT less than 3 seconds Minimal ecchymosis present dorsal aspect the right forefoot.  There is minimal edema to the area there is no erythema or warmth.  No open lesions.  There is decreased tenderness palpation of the digits and forefoot.  Unable to elicit any significant tenderness palpation of the left side. No open lesions or pre-ulcerative lesions.  No pain with calf compression, swelling, warmth, erythema  Assessment: Right foot bone marrow edema, stress reaction  Plan: -All treatment options discussed with the patient including all alternatives, risks, complications.  -X-rays were obtained reviewed.  No definitive evidence of acute fracture or osteomyelitis. -She will proceed with another Unna boot today.  This was applied and we discussed precautions on this and when to move it. -Continue the cam boot for now the right side. -Continue ice elevate. -Patient encouraged to call the office with any questions, concerns, change in symptoms.   RTC 2 weeks or sooner if needed  Trula Slade DPM

## 2018-06-02 ENCOUNTER — Ambulatory Visit: Payer: Medicare Other | Admitting: Podiatry

## 2018-07-03 ENCOUNTER — Ambulatory Visit: Payer: Medicare Other | Admitting: "Endocrinology

## 2018-07-13 ENCOUNTER — Ambulatory Visit (INDEPENDENT_AMBULATORY_CARE_PROVIDER_SITE_OTHER): Payer: Medicare Other | Admitting: Podiatry

## 2018-07-13 ENCOUNTER — Encounter: Payer: Self-pay | Admitting: Podiatry

## 2018-07-13 ENCOUNTER — Telehealth: Payer: Self-pay | Admitting: *Deleted

## 2018-07-13 ENCOUNTER — Other Ambulatory Visit: Payer: Self-pay

## 2018-07-13 VITALS — Temp 98.1°F

## 2018-07-13 DIAGNOSIS — M84377D Stress fracture, right toe(s), subsequent encounter for fracture with routine healing: Secondary | ICD-10-CM

## 2018-07-13 DIAGNOSIS — D7589 Other specified diseases of blood and blood-forming organs: Secondary | ICD-10-CM

## 2018-07-13 DIAGNOSIS — R609 Edema, unspecified: Secondary | ICD-10-CM | POA: Diagnosis not present

## 2018-07-13 DIAGNOSIS — M84374A Stress fracture, right foot, initial encounter for fracture: Secondary | ICD-10-CM

## 2018-07-13 DIAGNOSIS — T148XXA Other injury of unspecified body region, initial encounter: Secondary | ICD-10-CM

## 2018-07-13 DIAGNOSIS — R937 Abnormal findings on diagnostic imaging of other parts of musculoskeletal system: Secondary | ICD-10-CM

## 2018-07-13 NOTE — Telephone Encounter (Signed)
-----   Message from Trula Slade, DPM sent at 07/13/2018  9:43 AM EDT ----- Can you order a new MRI of the right foot? She had an MRI done in February that showed swelling and stress reaction to the toes. She is continuing to have pain/swelling/bruising. X-rays never showed anything. I would like to get a new MRI to evaluate healing. Thanks.

## 2018-07-13 NOTE — Telephone Encounter (Signed)
Roy, "THIS [place] MEDICAID MEMBER DOES NOT REQUIRE PRIOR AUTHORIZATION FOR [place] OR  DMA AT THIS TIME." Faxed orders with authorization to [place].

## 2018-07-15 NOTE — Progress Notes (Signed)
Subjective: 28 year old female presents the office today with her mom for follow-up evaluation of right foot swelling and pain.  She states that she still gets pain and swelling to the right foot although the bruising has improved.  She is currently off of her foot.  She stopped wearing the boot because it was hurting.  She getting pain more to the balls of the foot as well as the bottom of the toes.  No redness or open sores.  She states that the [employer] is been very helpful.  Denies any systemic complaints such as fevers, chills, nausea, vomiting. No acute changes since last appointment, and no other complaints at this time.   Objective: AAO x3, NAD DP/PT pulses palpable bilaterally, CRT less than 3 seconds There is mild edema to her toes on the right foot there is no erythema or warmth.  Is no areas of skin breakdown or signs of infection.  No increase in warmth.  There is equal skin temperature bilaterally.  There is no mottling of skin.  There is no specific area pinpoint bony tenderness.  No bruising. No open lesions or pre-ulcerative lesions.  No pain with calf compression, swelling, warmth, erythema  Assessment: Stress reaction right foot  Plan: -All treatment options discussed with the patient including all alternatives, risks, complications.  -At this time given her continued swelling and discomfort of the toes and would recommend repeat MRI to see about healing of the stress changes to the toes.  If continues recommend bone stimulator.  X-rays have been negative previously. Louretta Parma boot was applied today.  Encouraged elevation. -Patient encouraged to call the office with any questions, concerns, change in symptoms.  Trula Slade DPM

## 2018-08-20 ENCOUNTER — Other Ambulatory Visit: Payer: Self-pay

## 2018-08-20 ENCOUNTER — Other Ambulatory Visit: Payer: Self-pay | Admitting: Podiatry

## 2018-08-20 ENCOUNTER — Ambulatory Visit
Admission: RE | Admit: 2018-08-20 | Discharge: 2018-08-20 | Disposition: A | Discharge status: Home / Self Care | Payer: Medicare Other | Source: Ambulatory Visit | Attending: Podiatry | Admitting: Podiatry

## 2018-08-20 ENCOUNTER — Other Ambulatory Visit: Payer: Medicare Other

## 2018-08-20 DIAGNOSIS — M84377D Stress fracture, right toe(s), subsequent encounter for fracture with routine healing: Secondary | ICD-10-CM

## 2018-08-20 DIAGNOSIS — R937 Abnormal findings on diagnostic imaging of other parts of musculoskeletal system: Secondary | ICD-10-CM

## 2018-08-20 DIAGNOSIS — R609 Edema, unspecified: Secondary | ICD-10-CM

## 2018-08-20 DIAGNOSIS — T148XXA Other injury of unspecified body region, initial encounter: Secondary | ICD-10-CM

## 2018-08-22 ENCOUNTER — Other Ambulatory Visit: Payer: Self-pay

## 2018-08-22 ENCOUNTER — Ambulatory Visit
Admission: RE | Admit: 2018-08-22 | Discharge: 2018-08-22 | Disposition: A | Discharge status: Home / Self Care | Payer: Medicare Other | Source: Ambulatory Visit | Attending: Podiatry | Admitting: Podiatry

## 2018-08-22 DIAGNOSIS — R937 Abnormal findings on diagnostic imaging of other parts of musculoskeletal system: Secondary | ICD-10-CM

## 2018-08-22 DIAGNOSIS — R609 Edema, unspecified: Secondary | ICD-10-CM

## 2018-08-22 DIAGNOSIS — S9031XA Contusion of right foot, initial encounter: Secondary | ICD-10-CM | POA: Diagnosis not present

## 2018-08-22 DIAGNOSIS — M84377D Stress fracture, right toe(s), subsequent encounter for fracture with routine healing: Secondary | ICD-10-CM

## 2018-09-07 ENCOUNTER — Ambulatory Visit (INDEPENDENT_AMBULATORY_CARE_PROVIDER_SITE_OTHER): Payer: Medicare Other | Admitting: Podiatry

## 2018-09-07 ENCOUNTER — Other Ambulatory Visit: Payer: Self-pay

## 2018-09-07 ENCOUNTER — Ambulatory Visit (INDEPENDENT_AMBULATORY_CARE_PROVIDER_SITE_OTHER): Payer: Medicare Other

## 2018-09-07 DIAGNOSIS — M1990 Unspecified osteoarthritis, unspecified site: Secondary | ICD-10-CM | POA: Diagnosis not present

## 2018-09-07 DIAGNOSIS — T148XXA Other injury of unspecified body region, initial encounter: Secondary | ICD-10-CM

## 2018-09-07 DIAGNOSIS — S9001XA Contusion of right ankle, initial encounter: Secondary | ICD-10-CM | POA: Diagnosis not present

## 2018-09-07 DIAGNOSIS — R937 Abnormal findings on diagnostic imaging of other parts of musculoskeletal system: Secondary | ICD-10-CM

## 2018-09-07 DIAGNOSIS — M7989 Other specified soft tissue disorders: Secondary | ICD-10-CM | POA: Diagnosis not present

## 2018-09-07 DIAGNOSIS — D7589 Other specified diseases of blood and blood-forming organs: Secondary | ICD-10-CM

## 2018-09-07 DIAGNOSIS — S99921A Unspecified injury of right foot, initial encounter: Secondary | ICD-10-CM

## 2018-09-07 NOTE — Progress Notes (Signed)
Subjective: 28 year old female presents the office today for follow-up evaluation after the MRI.  She states that she still getting bruising and pain to the top of her foot.  She states that she has had chronic bruising and she is getting other areas of her body.  She has follow-up with her primary care physician for this.  She is also concerned about autoimmune disease.  She has previously seen a rheumatologist at [facility] for possible lupus.  She states that both of her parents have lupus and she was told that she has an autoimmune issue but never been able to diagnose.  She has not followed up with rheumatology and she would like actually like to look into getting a new physician.  She states that she has significant arthritis in her back as well as she was told she has osteoarthritis in her hands. Denies any systemic complaints such as fevers, chills, nausea, vomiting. No acute changes since last appointment, and no other complaints at this time.   Objective: AAO x3, NAD DP/PT pulses palpable bilaterally, CRT less than 3 seconds Bruising present dorsal aspect of the right foot.  This is where she gets majority of tenderness.  There is no erythema or warmth.  No pain in the digits.  No open lesions or pre-ulcerative lesions.  No pain with calf compression, swelling, warmth, erythema  Assessment: 28 year old female with chronic bruising right foot, bone marrow edema on MRI; multiple joint pain  Plan: -All treatment options discussed with the patient including all alternatives, risks, complications.  -Reviewed the MRI with her and her guardian today.  Unna boot was applied.  Precautions were advised on when to remove this.  She states that she has been on several times her self.  The dispensed materials for her to apply but discussed not to apply it too tight as it can constrict because of circulation issues. -She recently has been having increased foot pain without any injury.  She is having quite a bit  of bruising.  She is embracing other areas of her body as well.  Discussed with hematology evaluation.  However she is more concerned to arthritis.  She has had previous MRIs of her back that she and her guardan reportedly showed arthritis and she was told she has significant osteoarthritis in other joints as well by other physicians.  It seems that she has some underlying autoimmune disease.  She wants to get a new rheumatologist.  I will refer her to Dr. Berna Bue.  -Patient encouraged to call the office with any questions, concerns, change in symptoms.   Trula Slade DPM

## 2018-09-08 ENCOUNTER — Other Ambulatory Visit: Payer: Self-pay

## 2018-09-08 NOTE — Progress Notes (Signed)
Error

## 2018-09-09 ENCOUNTER — Telehealth: Payer: Self-pay | Admitting: *Deleted

## 2018-09-09 ENCOUNTER — Other Ambulatory Visit: Payer: Self-pay | Admitting: Podiatry

## 2018-09-09 DIAGNOSIS — M1990 Unspecified osteoarthritis, unspecified site: Secondary | ICD-10-CM

## 2018-09-09 NOTE — Telephone Encounter (Signed)
-----   Message from Trula Slade, DPM sent at 09/07/2018  8:05 PM EDT ----- Rae Halsted or Lattie Haw- Can you please put in a referral to Dr. Berna Bue with rheumatology? When we send over my clinicals I would like to include some blood work and also her MRI and CT of her back and foot. Thanks.

## 2018-09-09 NOTE — Telephone Encounter (Signed)
Called Dr Lenna Gilford office and will be sending the paper work over. Traci Hensley

## 2018-09-09 NOTE — Telephone Encounter (Signed)
Called and left a message for Anderson Malta at Dr Berna Bue office to call me back to get a referral for the patient. Lattie Haw

## 2018-09-10 ENCOUNTER — Telehealth: Payer: Self-pay | Admitting: *Deleted

## 2018-09-10 NOTE — Telephone Encounter (Signed)
Sent the necessary paper work over to Dr Juliene Pina office today at [phone] to [facility] the referral coordinator. Lattie Haw

## 2018-09-10 NOTE — Telephone Encounter (Signed)
-----   Message from Trula Slade, DPM sent at 09/07/2018  8:05 PM EDT ----- Rae Halsted or Lattie Haw- Can you please put in a referral to Dr. Berna Bue with rheumatology? When we send over my clinicals I would like to include some blood work and also her MRI and CT of her back and foot. Thanks.

## 2018-10-05 ENCOUNTER — Other Ambulatory Visit: Payer: Self-pay

## 2018-10-05 ENCOUNTER — Ambulatory Visit (INDEPENDENT_AMBULATORY_CARE_PROVIDER_SITE_OTHER): Payer: Medicare Other | Admitting: Podiatry

## 2018-10-05 ENCOUNTER — Encounter: Payer: Self-pay | Admitting: Podiatry

## 2018-10-05 VITALS — Temp 97.2°F

## 2018-10-05 DIAGNOSIS — M7989 Other specified soft tissue disorders: Secondary | ICD-10-CM

## 2018-10-05 DIAGNOSIS — T148XXA Other injury of unspecified body region, initial encounter: Secondary | ICD-10-CM | POA: Diagnosis not present

## 2018-10-05 DIAGNOSIS — D7589 Other specified diseases of blood and blood-forming organs: Secondary | ICD-10-CM | POA: Diagnosis not present

## 2018-10-05 DIAGNOSIS — R937 Abnormal findings on diagnostic imaging of other parts of musculoskeletal system: Secondary | ICD-10-CM

## 2018-10-05 NOTE — Progress Notes (Signed)
Subjective: 28 year old female presents the office today for follow-up evaluation of the right foot pain, bruising.  She has been doing the unna boots and this is been helpful.  The bruising improved as well as the swelling but she still gets discomfort.  She is not been able to wear regular shoe because hurts.  She wears foot pumps which feel better.  No recent injury or falls.  She is scheduled to see Dr. Lenna Gilford on 10/14/2018.   Denies any systemic complaints such as fevers, chills, nausea, vomiting. No acute changes since last appointment, and no other complaints at this time.   Objective: AAO x3, NAD DP/PT pulses palpable bilaterally, CRT less than 3 seconds Bruising that was present dorsal aspect of the right foot has resolved but there is small blanchable areas of erythema although mild.  No area of increased warmth.  There is no open lesions.  Mild discomfort diffusely on dorsal forefoot. No pain in the digits.  No open lesions or pre-ulcerative lesions.  No pain with calf compression, swelling, warmth, erythema  MRI 08/22/2018: 1. Low-grade edema signal particularly distally in the distal phalanx great toe, suggesting a stress injury or inflammation. 2. Subtle nonspecific subcutaneous edema dorsal to the second and third metatarsals.  MRI 04/23/2018: Scattered marrow edema throughout all of the toes is worst in the distal phalanx of the great toe and middle phalanx of the second toe. This finding is nonspecific and could be due to painful bone marrow edema syndrome, osteonecrosis, osteomyelitis or stress change. Osteomyelitis and stress change are thought less likely.  Assessment: 28 year old female with chronic bruising right foot, bone marrow edema on MRI; multiple joint pain  Plan: -All treatment options discussed with the patient including all alternatives, risks, complications.  -As the unna boots have been helpful we will continue this therapy.  This was applied today.   Continue elevation.  She also has them at home she can apply.  Precautions were advised on when to remove them.  She feels comfortable finding herself at home that she has done this previously.  Plan for the next 2 to 3 weeks and at that point she is continue to improve we will start physical therapy.  Trula Slade DPM

## 2018-10-26 ENCOUNTER — Other Ambulatory Visit: Payer: Self-pay

## 2018-10-26 ENCOUNTER — Ambulatory Visit (INDEPENDENT_AMBULATORY_CARE_PROVIDER_SITE_OTHER): Payer: Medicare Other | Admitting: Podiatry

## 2018-10-26 VITALS — Temp 98.0°F

## 2018-10-26 DIAGNOSIS — D7589 Other specified diseases of blood and blood-forming organs: Secondary | ICD-10-CM

## 2018-10-26 DIAGNOSIS — M7989 Other specified soft tissue disorders: Secondary | ICD-10-CM | POA: Diagnosis not present

## 2018-10-26 DIAGNOSIS — T148XXA Other injury of unspecified body region, initial encounter: Secondary | ICD-10-CM | POA: Diagnosis not present

## 2018-10-26 DIAGNOSIS — R937 Abnormal findings on diagnostic imaging of other parts of musculoskeletal system: Secondary | ICD-10-CM

## 2018-10-28 ENCOUNTER — Telehealth: Payer: Self-pay | Admitting: *Deleted

## 2018-10-28 DIAGNOSIS — M7989 Other specified soft tissue disorders: Secondary | ICD-10-CM

## 2018-10-28 DIAGNOSIS — T148XXA Other injury of unspecified body region, initial encounter: Secondary | ICD-10-CM

## 2018-10-28 DIAGNOSIS — R937 Abnormal findings on diagnostic imaging of other parts of musculoskeletal system: Secondary | ICD-10-CM

## 2018-10-28 NOTE — Telephone Encounter (Signed)
-----   Message from Trula Slade, DPM sent at 10/26/2018  9:07 AM EDT ----- Can you please order PT for her in [place] at a [facility] facility? Thanks.

## 2018-10-28 NOTE — Telephone Encounter (Signed)
Faxed referral, demographics to [place].

## 2018-11-01 NOTE — Progress Notes (Signed)
Subjective: 28 year old female presents the office today for follow-up evaluation of the right foot pain, bruising.  Overall she states that she is made improvement.  The introducing helpful.  She has not been decreasing her swelling.  The pain is also improved.  She is unable to follow-up with Dr. Lenna Gilford yet because of the pain in the neck and she is not able to wear a mask. Denies any systemic complaints such as fevers, chills, nausea, vomiting. No acute changes since last appointment, and no other complaints at this time.   Objective: AAO x3, NAD DP/PT pulses palpable bilaterally, CRT less than 3 seconds There is minimal edema to the right foot there is no erythema or warmth.  No bruising.  No significant tenderness.  Overall clinically she appears to be much improved. No pain with calf compression, swelling, warmth, erythema  MRI 08/22/2018: 1. Low-grade edema signal particularly distally in the distal phalanx great toe, suggesting a stress injury or inflammation. 2. Subtle nonspecific subcutaneous edema dorsal to the second and third metatarsals.  MRI 04/23/2018: Scattered marrow edema throughout all of the toes is worst in the distal phalanx of the great toe and middle phalanx of the second toe. This finding is nonspecific and could be due to painful bone marrow edema syndrome, osteonecrosis, osteomyelitis or stress change. Osteomyelitis and stress change are thought less likely.  Assessment: 28 year old female with chronic bruising right foot, bone marrow edema on MRI; multiple joint pain  Plan: -All treatment options discussed with the patient including all alternatives, risks, complications.  -Unna boots applied today.  Precautions were present during this.  Also order for physical therapy was completed today. -Again discussed shoe modifications.  Return in about 6 weeks (around 12/07/2018).  Trula Slade DPM

## 2018-11-05 ENCOUNTER — Telehealth (HOSPITAL_COMMUNITY): Payer: Self-pay | Admitting: Specialist

## 2018-11-05 ENCOUNTER — Ambulatory Visit (HOSPITAL_COMMUNITY): Payer: Medicare Other | Attending: Podiatry | Admitting: Physical Therapy

## 2018-11-05 ENCOUNTER — Telehealth (HOSPITAL_COMMUNITY): Payer: Self-pay | Admitting: Family Medicine

## 2018-11-05 ENCOUNTER — Encounter (HOSPITAL_COMMUNITY): Payer: Self-pay

## 2018-11-05 ENCOUNTER — Other Ambulatory Visit: Payer: Self-pay

## 2018-11-05 NOTE — Telephone Encounter (Signed)
Rehab Manager called to follow up with patient's caregiver regarding our masking policy.  We agreed that due to her physical and mental health history, patient will wait in car for therapist to bring her into therapy, will be brought through waiting room and to private treatment room without a mask, and all visitors will be 6 feet away.  Rehab manager offered telehealth, which is not an option due to homes limited internet service.  Rehab manager did inform caregiver that treatment may be limited due to the constraints of a private treatment room.   Caregiver stated agreeance to this. Vangie Bicker, [place], OTR/L [phone]

## 2018-11-05 NOTE — Telephone Encounter (Signed)
11/05/18  The patient came in along with her [place].  Neither was wearing a mask and I asked if they had a mask.  The MPOA said that they don't wear masks because if they wear for prolonged periods of time they lose consciousness.  I told her that I understood but our policy states that as long as they are in the waiting room they should wear a mask and the MPOA stated that she could do that but Yuliya couldn't.  Idell got upset and left the clinic to go outside.  The MPOA stated that Dennell is constantly on oxygen, although she wasn't on oxygen while she was in the clinic.  She stated that [place] could get CO2 poisoning by wearing the mask and it could kill her.  I explained that I would need to speak to my manager.  I explained everything to [facility] and she said that the patient would have to go straight to the room and not be in close proximity of patients and as long as the therapist was comfortable treating her.  I went back to the lobby and they had left and there car was no longer in the parking lot.

## 2018-11-18 ENCOUNTER — Ambulatory Visit (HOSPITAL_COMMUNITY): Payer: Medicare Other | Attending: Podiatry

## 2018-11-18 ENCOUNTER — Encounter (HOSPITAL_COMMUNITY): Payer: Self-pay

## 2018-11-18 ENCOUNTER — Other Ambulatory Visit: Payer: Self-pay

## 2018-11-18 DIAGNOSIS — M25571 Pain in right ankle and joints of right foot: Secondary | ICD-10-CM
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
## 2018-11-25 ENCOUNTER — Encounter (HOSPITAL_COMMUNITY): Payer: Self-pay | Admitting: Physical Therapy

## 2018-11-25 ENCOUNTER — Ambulatory Visit (HOSPITAL_COMMUNITY): Payer: Medicare Other | Admitting: Physical Therapy

## 2018-11-25 ENCOUNTER — Other Ambulatory Visit: Payer: Self-pay

## 2018-11-25 DIAGNOSIS — M25571 Pain in right ankle and joints of right foot: Secondary | ICD-10-CM

## 2018-11-25 NOTE — Patient Instructions (Signed)
Access Code: [email or web address]  URL: [email or web address]  Date: 11/25/2018  Prepared by: Jerene Pitch   Exercises seated foot raise - 15 reps - 2 sets - 5 hold - 1x daily - 5x weekly press inside of the foot down into floor - 15 reps - 2 sets - 5 hold - 1x daily - 5x weekly Long Sitting Ankle Plantar Flexion with Resistance - 15 reps - 2 sets - 5 hold - 1x daily - 5x weekly

## 2018-11-25 NOTE — Therapy (Signed)
[place], [place], [address] Phone: [phone]   Fax:  [phone]  Physical Therapy Treatment  Patient Details  Name: Traci Hensley MRN: [id number] Date of Birth: 06/02/1990 Referring Provider (PT): Annice Needy   Encounter Date: 11/25/2018  PT End of Session - 11/25/18 0906    Visit Number  2    Number of Visits  6    Date for PT Re-Evaluation  12/09/18    Authorization Type  medicare primary and medicaid secondary    Authorization - Visit Number  2    Authorization - Number of Visits  6    PT Start Time  0912    PT Stop Time  0958    PT Time Calculation (min)  46 min    Activity Tolerance  Other (comment)    Behavior During Therapy  Restless;Anxious       Past Medical History:  Diagnosis Date  . Agoraphobia   . Anxiety   . Bipolar 1 disorder ([place])   . Chronic abdominal pain   . Chronic chest pain   . Depression   . PTSD (post-traumatic stress disorder)   . Scoliosis     Past Surgical History:  Procedure Laterality Date  . MOUTH SURGERY      There were no vitals filed for this visit.  Subjective Assessment - 11/25/18 0906    Subjective  Patient reports she is a bit sore today. States that she is still waiting on Rheumatology to see her but they are refusing to see her because she cannot wear a face mask. States that she didn't have too much trouble with the exercises over the last week.    Patient is accompained by:  Family member   power of attorney   Limitations  Standing;Walking    How long can you sit comfortably?  no difficulties    How long can you stand comfortably?  30 minutes -otherwise it will start to swell and becomes painful    How long can you walk comfortably?  can walk a few hundred feet -otherwise it will start to swell and becomes painful    Pain Score  5     Pain Location  Foot    Pain Onset  More than a month ago             [facility] Adult PT  Treatment/Exercise - 11/25/18 0001      Manual Therapy   Manual Therapy  Joint mobilization;Soft tissue mobilization    Manual therapy comments  all manual therapy performed independently of other interventions    Joint Mobilization  right metatarsal mobilizations grade II/III - tolerated well.     Soft tissue mobilization  STM to right interossei muscles and plantar fascia       Ankle Exercises: Seated   Toe Raise  15 reps;5 seconds   R, 2 sets   Other Seated Ankle Exercises  medial press downs R ankle 2x10. 5"  holds    Other Seated Ankle Exercises  PF with theraband - green 2x15, 5"             PT Education - 11/25/18 1008    Education Details  HEP, reasoning behind exercises    Person(s) Educated  Patient;Caregiver(s)    Methods  Explanation    Comprehension  Verbalized understanding       PT Short Term Goals - 11/18/18 1515      PT SHORT TERM GOAL #  1   Title  Patient will be independent in HEP to improve functional outcomes.    Time  3    Period  Weeks    Status  New    Target Date  12/09/18      PT SHORT TERM GOAL #2   Title  Patient will report at least 25% improvement in overall symptoms in right foot to improve QOL and ability to weight bear.    Baseline  0% better    Time  3    Period  Weeks    Status  New    Target Date  12/09/18      PT SHORT TERM GOAL #3   Title  Patient will be able to tolerate ROM measurements with goniometer to obtain formal ankle ROM measurements.    Baseline  unable to tolerate on this date    Time  3    Period  Weeks    Status  New    Target Date  12/09/18        PT Long Term Goals - 11/18/18 1517      PT LONG TERM GOAL #1   Title  Patient will report at least 50% improvement in overall symptoms in right foot to improve QOL and ability to weight bear.    Baseline  0% better    Time  6    Period  Weeks    Status  New    Target Date  12/30/18      PT LONG TERM GOAL #2   Title  Patient will be able to tolerate  balance testing on R LE to demonstrate improved R LE  weightbearing.    Baseline  unable to perform balance testing on R LE today    Time  6    Period  Weeks    Status  New    Target Date  12/30/18            Plan - 11/25/18 0907    Clinical Impression Statement  Patient tolerated session better compared to last session. Able to tolerate manual therapy on this session and even reported that some mobilizations felt good. Added seated exercises to HEP as weightbearing still bothersome at this time. No complaints with exercises. Rest breaks taken between sets secondary to "weirdness" with foot/toe exercises and needing a break. Patient would continue to benefit from skilled physical therapy focusing on ROM, strengthening and transition to WB exercises as tolerated.    Personal Factors and Comorbidities  Behavior Pattern;Past/Current Experience    Examination-Activity Limitations  Squat;Stairs;Stand;Lift    Examination-Participation Restrictions  Community Activity    Stability/Clinical Decision Making  Stable/Uncomplicated    Rehab Potential  Fair    PT Frequency  1x / week    PT Duration  6 weeks    PT Treatment/Interventions  ADLs/Self Care Home Management;Aquatic Therapy;Biofeedback;Cryotherapy;Electrical Stimulation;Iontophoresis 4mg /ml Dexamethasone;Moist Heat;Traction;Parrafin;Balance training;Therapeutic exercise;Therapeutic activities;Functional mobility training;Stair training;Gait training;Neuromuscular re-education;Patient/family education;Scar mobilization;Passive range of motion;Manual techniques;Taping    PT Next Visit Plan  balance exercises as able, joint mobilization, ankle/foot Strengthening/ROM    PT Home Exercise Plan  gastroc stretch, ankle circles, seated heel raises, resisted PF (GTB), medial press downs    Consulted and Agree with Plan of Care  Patient;Other (Comment)   power of attorney      Patient will benefit from skilled therapeutic intervention in order to  improve the following deficits and impairments:  Pain, Decreased mobility, Decreased strength, Difficulty walking  Visit Diagnosis: Pain in  right ankle and joints of right foot     Problem List Patient Active Problem List   Diagnosis Date Noted  . History of enlargement of pituitary gland 12/25/2017  . Moderate malnutrition ([place]) 12/25/2017  . Loss of weight 12/25/2017  . Vitamin D deficiency 12/25/2017  . Cervical spondylosis 11/27/2017  . Chronic fatigue 11/27/2017  . Family history of lupus erythematosus 11/27/2017  . Thoracic spondylosis without myelopathy 11/27/2017  . Ingrown toenail 11/23/2015  . Dyspepsia 08/31/2014  . Abdominal pain 06/29/2014  . Achilles tendinitis of both lower extremities 01/23/2014  . Palpitations 04/19/2013  . Chest pain 04/19/2013  . Sinus tachycardia 04/19/2013  . Anxiety 04/19/2013   10:16 AM, 11/25/18 Jerene Pitch, DPT Physical Therapy with [facility]  [phone] office    [place] [address] [place], [place], [address] Phone: [phone]   Fax:  [phone]  Name: Traci Hensley MRN: [id number] Date of Birth: Nov 19, 1990

## 2018-11-30 ENCOUNTER — Telehealth (HOSPITAL_COMMUNITY): Payer: Self-pay | Admitting: Physical Therapy

## 2018-11-30 NOTE — Telephone Encounter (Signed)
pt's poa called to cancel the appt due to the pt has a bruise on her hip that needs to heal. They will see [place] next week.

## 2018-12-02 ENCOUNTER — Ambulatory Visit (HOSPITAL_COMMUNITY): Payer: Medicare Other | Admitting: Physical Therapy

## 2018-12-07 ENCOUNTER — Encounter: Payer: Self-pay | Admitting: Podiatry

## 2018-12-07 ENCOUNTER — Other Ambulatory Visit: Payer: Self-pay

## 2018-12-07 ENCOUNTER — Ambulatory Visit (INDEPENDENT_AMBULATORY_CARE_PROVIDER_SITE_OTHER): Payer: Medicare Other | Admitting: Podiatry

## 2018-12-07 DIAGNOSIS — M7989 Other specified soft tissue disorders: Secondary | ICD-10-CM

## 2018-12-07 DIAGNOSIS — T148XXA Other injury of unspecified body region, initial encounter: Secondary | ICD-10-CM

## 2018-12-07 NOTE — Progress Notes (Signed)
Subjective: 28 year old female presents the office today for follow-up evaluation of the right foot pain, bruising.  Overall she was doing better.  Her first physical therapy treatment did well, second treatment she started get bruising increased pain.  Because that she canceled physical therapy.  She has not yet followed up with a rheumatologist due to the recent pandemic she cannot wear a mask.  She states that she started a bruise on her leg and other areas as well.  She does have a history of B12 deficiency recently and she reports. Denies any systemic complaints such as fevers, chills, nausea, vomiting. No acute changes since last appointment, and no other complaints at this time.   Objective: AAO x3, NAD DP/PT pulses palpable bilaterally, CRT less than 3 seconds There is minimal edema to the right foot there is no erythema or warmth.  There is tenderness the MPJs there is mild bruising present today but there is no significant swelling.  No erythema or warmth.  No pain with calf compression, swelling, warmth, erythema  MRI 08/22/2018: 1. Low-grade edema signal particularly distally in the distal phalanx great toe, suggesting a stress injury or inflammation. 2. Subtle nonspecific subcutaneous edema dorsal to the second and third metatarsals.  MRI 04/23/2018: Scattered marrow edema throughout all of the toes is worst in the distal phalanx of the great toe and middle phalanx of the second toe. This finding is nonspecific and could be due to painful bone marrow edema syndrome, osteonecrosis, osteomyelitis or stress change. Osteomyelitis and stress change are thought less likely.  Assessment: 28 year old female with chronic bruising right foot, bone marrow edema on MRI; multiple joint pain  Plan: -All treatment options discussed with the patient including all alternatives, risks, complications.  -Unna boots applied today.  Precautions were present during this.  Also order for physical  therapy was completed today.  She is scheduled for physical therapy later this week.  Will discuss with her primary care physician possible hematology consult or work-up given the bruising. -Again discussed shoe modifications.  Trula Slade DPM

## 2018-12-09 ENCOUNTER — Ambulatory Visit (HOSPITAL_COMMUNITY): Payer: Medicare Other | Admitting: Physical Therapy

## 2018-12-09 ENCOUNTER — Other Ambulatory Visit: Payer: Self-pay

## 2018-12-09 ENCOUNTER — Encounter (HOSPITAL_COMMUNITY): Payer: Self-pay | Admitting: Physical Therapy

## 2018-12-09 DIAGNOSIS — M25571 Pain in right ankle and joints of right foot: Secondary | ICD-10-CM | POA: Diagnosis not present

## 2018-12-09 NOTE — Therapy (Signed)
[place], [place], [address] Phone: [phone]   Fax:  [phone]  Physical Therapy Treatment  Patient Details  Name: Traci Hensley MRN: [id number] Date of Birth: 04/18/1990 Referring Provider (PT): Annice Needy PHYSICAL THERAPY DISCHARGE SUMMARY  Visits from Start of Care: 3  Current functional level related to goals / functional outcomes: Goals not met, unable to bear weight on left foot/leg   Remaining deficits: Strength/ROM/walking/balance   Education / Equipment: On current condition and to f/u with primary healthcare provider Plan: Patient agrees to discharge.  Patient goals were not met. Patient is being discharged due to                                                     ?????PT contraindicated at this time, referring back to primary care provider.         Encounter Date: 12/09/2018  PT End of Session - 12/09/18 1202    Visit Number  3    Number of Visits  6    Date for PT Re-Evaluation  12/09/18    Authorization Type  medicare primary and medicaid secondary    Authorization - Visit Number  3    Authorization - Number of Visits  6    PT Start Time  [phone]    PT Stop Time  0940    PT Time Calculation (min)  22 min    Activity Tolerance  Other (comment)    Behavior During Therapy  Restless;Anxious       Past Medical History:  Diagnosis Date  . Agoraphobia   . Anxiety   . Bipolar 1 disorder ([place])   . Chronic abdominal pain   . Chronic chest pain   . Depression   . PTSD (post-traumatic stress disorder)   . Scoliosis     Past Surgical History:  Procedure Laterality Date  . MOUTH SURGERY      There were no vitals filed for this visit.  Subjective Assessment - 12/09/18 1158    Subjective  Patient reports she had severe bruising that started the night after her last treatment session. Reports that that is why she cancelled her last appointment. Reports she has also been getting  bruising along her right hip since last session and her entire leg throbs. Reports that her entire leg is very painful right now and she only came because the doctor thought it might help. Reports she saw the doctor on Monday and they wrapped her in an unna boot which she took off this morning. Reports it helped a bit with the swelling but is still very painful. Reports she is frustrated because the visual bruising goes away so quickly but she continues to have swelling and bruising under her skin and she feels she needs to start taking pictures when the visual bruise is present. States she is frustrated as many of her providers are not currently seeing patient's that are not covid related and she has not been able to get her blood drawn since last year and she was due in March of this year.    Patient is accompained by:  Family member   power of attorney   Limitations  Standing;Walking    How long can you sit comfortably?  no difficulties    How long can  you stand comfortably?  30 minutes -otherwise it will start to swell and becomes painful    How long can you walk comfortably?  can walk a few hundred feet -otherwise it will start to swell and becomes painful    Currently in Pain?  Yes    Pain Score  9     Pain Location  Leg    Pain Orientation  Right    Pain Descriptors / Indicators  Sore;Tightness;Throbbing    Pain Onset  More than a month ago                               PT Education - 12/09/18 1159    Education Details  on current condition, how symptoms not consistent with musculoskeletal pain/symptoms, about plan moving forward (follow up with primary care provider), about how PT is contra-indicated at this time. discussed and educated patient in use of compression sock but patient reports it is too much pressure.    Person(s) Educated  Patient    Methods  Explanation    Comprehension  Verbalized understanding       PT Short Term Goals - 12/09/18 1229       PT SHORT TERM GOAL #1   Title  Patient will be independent in HEP to improve functional outcomes.    Time  3    Period  Weeks    Status  Not Met    Target Date  12/09/18      PT SHORT TERM GOAL #2   Title  Patient will report at least 25% improvement in overall symptoms in right foot to improve QOL and ability to weight bear.    Baseline  0% better    Time  3    Period  Weeks    Status  Not Met    Target Date  12/09/18      PT SHORT TERM GOAL #3   Title  Patient will be able to tolerate ROM measurements with goniometer to obtain formal ankle ROM measurements.    Baseline  unable to tolerate on this date    Time  3    Period  Weeks    Status  Not Met    Target Date  12/09/18        PT Long Term Goals - 12/09/18 1229      PT LONG TERM GOAL #1   Title  Patient will report at least 50% improvement in overall symptoms in right foot to improve QOL and ability to weight bear.    Baseline  0% better    Time  6    Period  Weeks    Status  Not Met      PT LONG TERM GOAL #2   Title  Patient will be able to tolerate balance testing on R LE to demonstrate improved R LE  weightbearing.    Baseline  unable to perform balance testing on R LE today    Time  6    Period  Weeks    Status  Not Met            Plan - 12/09/18 1225    Clinical Impression Statement  Session focused on education and discussion of current symptoms. At this time patient is unable to tolerate hands on interventions (severe bruising and pain reported following last session 2 weeks ago), standing/strengthening/ROM exercises secondary to severe pain. Discussed how intense two week long  bruising should not have presented after last session if injury is truly musculoskeletal in nature. Discussed how physical therapy is contraindicated at this time and strongly encouraged patient to follow up with primary care provided. Will message primary care provider on behalf of the patient to share findings and tolerance to  physical therapy. At this time patient is discharged from physical therapy and being referred back to primary care provider.    Personal Factors and Comorbidities  Behavior Pattern;Past/Current Experience    Examination-Activity Limitations  Squat;Stairs;Stand;Lift    Examination-Participation Restrictions  Community Activity    Stability/Clinical Decision Making  Stable/Uncomplicated    Rehab Potential  Fair    PT Frequency  1x / week    PT Duration  6 weeks    PT Treatment/Interventions  ADLs/Self Care Home Management;Aquatic Therapy;Biofeedback;Cryotherapy;Electrical Stimulation;Iontophoresis 49m/ml Dexamethasone;Moist Heat;Traction;Parrafin;Balance training;Therapeutic exercise;Therapeutic activities;Functional mobility training;Stair training;Gait training;Neuromuscular re-education;Patient/family education;Scar mobilization;Passive range of motion;Manual techniques;Taping    PT Next Visit Plan  balance exercises as able, joint mobilization, ankle/foot Strengthening/ROM    PT Home Exercise Plan  gastroc stretch, ankle circles, seated heel raises, resisted PF (GTB), medial press downs    Recommended Other Services  follow up with primary care provider    Consulted and Agree with Plan of Care  Patient;Other (Comment)   power of attorney      Patient will benefit from skilled therapeutic intervention in order to improve the following deficits and impairments:  Pain, Decreased mobility, Decreased strength, Difficulty walking  Visit Diagnosis: Pain in right ankle and joints of right foot     Problem List Patient Active Problem List   Diagnosis Date Noted  . History of enlargement of pituitary gland 12/25/2017  . Moderate malnutrition [place] 12/25/[place]  . Loss of weight 12/25/2017  . Vitamin D deficiency 12/25/2017  . Cervical spondylosis 11/27/2017  . Chronic fatigue 11/27/2017  . Family history of lupus erythematosus 11/27/2017  . Thoracic spondylosis without myelopathy 11/27/2017   . Ingrown toenail 11/23/2015  . Dyspepsia 08/31/2014  . Abdominal pain 06/29/2014  . Achilles tendinitis of both lower extremities 01/23/2014  . Palpitations 04/19/2013  . Chest pain 04/19/2013  . Sinus tachycardia 04/19/2013  . Anxiety 04/19/2013   12:32 PM, 12/09/18 MJerene Pitch DPT [facility] [phone]    [place] [address]Phone: [phone]  Fax:  [phone] Name: Traci JOSWICKMRN: [id number] of Birth: 103/28/1992

## 2018-12-16 ENCOUNTER — Encounter (HOSPITAL_COMMUNITY): Payer: Medicare Other | Admitting: Physical Therapy

## 2018-12-23 ENCOUNTER — Encounter (HOSPITAL_COMMUNITY): Payer: Medicare Other | Admitting: Physical Therapy

## 2019-01-04 ENCOUNTER — Encounter: Payer: Self-pay | Admitting: Podiatry

## 2019-01-04 ENCOUNTER — Telehealth: Payer: Self-pay | Admitting: *Deleted

## 2019-01-04 ENCOUNTER — Other Ambulatory Visit: Payer: Self-pay

## 2019-01-04 ENCOUNTER — Ambulatory Visit (INDEPENDENT_AMBULATORY_CARE_PROVIDER_SITE_OTHER): Payer: Medicare Other | Admitting: Podiatry

## 2019-01-04 DIAGNOSIS — T148XXA Other injury of unspecified body region, initial encounter: Secondary | ICD-10-CM | POA: Diagnosis not present

## 2019-01-04 DIAGNOSIS — M7989 Other specified soft tissue disorders: Secondary | ICD-10-CM

## 2019-01-04 DIAGNOSIS — S9001XA Contusion of right ankle, initial encounter: Secondary | ICD-10-CM

## 2019-01-04 DIAGNOSIS — R937 Abnormal findings on diagnostic imaging of other parts of musculoskeletal system: Secondary | ICD-10-CM

## 2019-01-04 NOTE — Progress Notes (Signed)
Subjective: 28 year old female presents the office today for Pap evaluation of right foot pain, bruising.  She states that she has no more having any pain to her foot.  Her main concern is that she still getting random bruising to both lower extremities.  She stopped physical therapy because of this and I was in agreement with it as well.  She also states that she has a history of anemia but never had bruising before. Denies any systemic complaints such as fevers, chills, nausea, vomiting. No acute changes since last appointment, and no other complaints at this time.   Objective: AAO x3, NAD DP/PT pulses palpable bilaterally, CRT less than 3 seconds There is no pain with right leg swelling is no edema, erythema.  Unable to palpate any issues.  Range of motion intact.  MMT 5/5.  There is bruising present to bilateral lower extremities and she also states that she is having bruising to her thighs. No pain with calf compression, swelling, warmth, erythema  Assessment: Sporadic bruising; resolved right foot pain  Plan: -All treatment options discussed with the patient including all alternatives, risks, complications.  -Given her ongoing symptoms as well as bruising I referred her to hematology.  For now her foot is doing well and see her back as needed.  On physical therapy treatment for her feet at this time and she is currently not expensing any pain. -Patient encouraged to call the office with any questions, concerns, change in symptoms.   Trula Slade DPM

## 2019-01-04 NOTE — Telephone Encounter (Signed)
-----   Message from Trula Slade, DPM sent at 01/04/2019  9:14 AM EDT ----- Can you please put in a referral for hematology? Thanks.

## 2019-01-04 NOTE — Telephone Encounter (Signed)
I spoke with [facility] and was told check pt's location preference, then order the referral through that group and they work the que.

## 2019-01-04 NOTE — Telephone Encounter (Signed)
I called pt and spoke with her medical Traci Hensley and she stated if would be fine to go to [facility] - Hematology.

## 2019-06-09 DIAGNOSIS — M792 Neuralgia and neuritis, unspecified: Secondary | ICD-10-CM | POA: Diagnosis not present

## 2019-06-09 DIAGNOSIS — Z1389 Encounter for screening for other disorder: Secondary | ICD-10-CM | POA: Diagnosis not present

## 2019-06-09 DIAGNOSIS — F419 Anxiety disorder, unspecified: Secondary | ICD-10-CM | POA: Diagnosis not present

## 2019-06-09 DIAGNOSIS — I10 Essential (primary) hypertension: Secondary | ICD-10-CM | POA: Diagnosis not present

## 2019-06-09 DIAGNOSIS — F4000 Agoraphobia, unspecified: Secondary | ICD-10-CM | POA: Diagnosis not present

## 2019-08-03 ENCOUNTER — Other Ambulatory Visit: Payer: Self-pay

## 2019-08-03 ENCOUNTER — Ambulatory Visit (INDEPENDENT_AMBULATORY_CARE_PROVIDER_SITE_OTHER): Payer: Medicare Other

## 2019-08-03 ENCOUNTER — Encounter: Payer: Self-pay | Admitting: Emergency Medicine

## 2019-08-03 ENCOUNTER — Ambulatory Visit
Admission: EM | Admit: 2019-08-03 | Discharge: 2019-08-03 | Disposition: A | Discharge status: Home / Self Care | Payer: Medicare Other | Attending: Emergency Medicine | Admitting: Emergency Medicine

## 2019-08-03 DIAGNOSIS — S92425A Nondisplaced fracture of distal phalanx of left great toe, initial encounter for closed fracture: Secondary | ICD-10-CM | POA: Diagnosis not present

## 2019-08-03 DIAGNOSIS — S99922A Unspecified injury of left foot, initial encounter: Secondary | ICD-10-CM | POA: Diagnosis not present

## 2019-08-03 MED ORDER — MELOXICAM 7.5 MG PO TABS: 7.5000 mg | ORAL_TABLET | Freq: Every day | ORAL | 0 refills | Status: DC

## 2019-08-03 NOTE — Discharge Instructions (Signed)
X-rays positive for possible tiny fracture chip to LT great toe Continue conservative management of rest, ice, and elevation Take mobic as needed for pain relief (may cause abdominal discomfort, ulcers, and GI bleeds avoid taking with other NSAIDs) Follow up with podiatry Return or go to the ER if you have any new or worsening symptoms (fever, chills, chest pain, redness, swelling, bruising, worsening symptoms despite treatment, etc...)

## 2019-08-03 NOTE — ED Triage Notes (Signed)
Dropped object on top of foot x 1 month ago. Her joint near her LT great toe has been stiff since then.  Today she was able to move the area, now it is swollen, purple and painful.

## 2019-08-03 NOTE — ED Provider Notes (Signed)
[facility]   [id number] 08/03/19 Arrival Time: [id number]  CC: LT foot pain  SUBJECTIVE: History from: patient. Traci Hensley is a 29 y.o. female complains of LT foot pain and injury x 1 month.  Reports dropping an item on her LT toe, and then moved great toe joint today that exacerbated symptoms.  Localizes the pain to the great toe.  Describes the pain as intermittent and pounding/ pressure in character.  Has tried OTC medications without relief.  Symptoms are made worse with walking.  Reports hx of microfracture in RT foot.   Bruising to MTP joint of LT great toe.  Denies fever, chills, erythema, effusion, weakness.  ROS: As per HPI.  All other pertinent ROS negative.     Past Medical History:  Diagnosis Date  . Agoraphobia   . Anxiety   . Bipolar 1 disorder ([place])   . Chronic abdominal pain   . Chronic chest pain   . Depression   . PTSD (post-traumatic stress disorder)   . Scoliosis    Past Surgical History:  Procedure Laterality Date  . MOUTH SURGERY     Allergies  Allergen Reactions  . Adhesive [Tape] Other (See Comments)    Eats skin almost instantly  . Blue Dyes (Parenteral) Other (See Comments)    BLUE DYES IS OK BY ITSELF, BUT CANNOT BE COMBINED WITH GREEN DYES  . Contrast Media [Iodinated Diagnostic Agents] Nausea And Vomiting    Vomiting up blood, shaking and tremors uncontrollable, dizziness and lightheadness  . Corn-Containing Products Anaphylaxis and Nausea And Vomiting    ALSO GRASS FAMILY PLANTS, VEGETABLES, ETC.   . Green Dyes Other (See Comments)    GREEN DYE IS OK BY ITSELF, BUT CANNOT BE COMBINED WITH BLUE DYE  . Nickel Itching, Swelling, Rash and Other (See Comments)  . Onion     Cannot be around onion at all or she has breathing difficulty  . Other Nausea Only, Rash and Other (See Comments)    Sunlight causes extreme fatigue , Migraine , Cat Dander  . Petroleum Distillate Itching, Swelling and Rash  . Silicone Rash    rash  . Bee Venom  Hives    Severe disorientation  . Cortisporin [Bacitra-Neomycin-Polymyxin-Hc]     Otic 1% suspension- causes hearing loss and pain.  . Yellow [place]et Venom Hives    Severe disorientation   No current facility-administered medications on file prior to encounter.   Current Outpatient Medications on File Prior to Encounter  Medication Sig Dispense Refill  . ADVAIR DISKUS 500-50 MCG/DOSE AEPB Inhale 1 puff into the lungs 2 (two) times daily as needed (for shortness of breath).   12  . albuterol (PROVENTIL HFA;VENTOLIN HFA) 108 (90 Base) MCG/ACT inhaler Inhale into the lungs every 6 (six) hours as needed for wheezing or shortness of breath.    . baclofen (LIORESAL) 10 MG tablet Take 10 mg by mouth 2 (two) times daily as needed for muscle spasms.   1  . D3-50 50000 units capsule Take 1 capsule 2 x a week 48 capsule 1  . diclofenac sodium (VOLTAREN) 1 % GEL Apply 4 g topically 4 (four) times daily.   5  . EPINEPHrine 0.3 mg/0.3 mL IJ SOAJ injection Inject 0.3 mg into the muscle once. As needed for allergic reaction    . gabapentin (NEURONTIN) 300 MG capsule Take 300 mg by mouth 3 (three) times daily as needed (pain).   2  . ipratropium-albuterol (DUONEB) 0.5-2.5 (3) MG/3ML SOLN USE  1 SOLUTION IN NEBULIZER 4 TIMES DAILY AS NEEDED  12  . levalbuterol (XOPENEX HFA) 45 MCG/ACT inhaler Inhale 2 puffs into the lungs every 8 (eight) hours as needed for wheezing or shortness of breath.     . levalbuterol (XOPENEX) 1.25 MG/3ML nebulizer solution Take 1.25 mg by nebulization every 8 (eight) hours as needed for wheezing or shortness of breath.   12  . montelukast (SINGULAIR) 10 MG tablet Take 10 mg by mouth daily.    . nitroGLYCERIN (NITROSTAT) 0.4 MG SL tablet Place 1 tablet (0.4 mg total) under the tongue every 5 (five) minutes as needed for chest pain. 25 tablet 12  . NONFORMULARY OR COMPOUNDED ITEM Apply 1 application topically 4 (four) times daily. Katamine/Gabapentin/Baclofen/Lidocaine/Menthol Cream    .  norgestimate-ethinyl estradiol (ESTARYLLA) 0.25-35 MG-MCG tablet Take 1 tablet by mouth daily.    . RESTASIS MULTIDOSE 0.05 % ophthalmic emulsion Place 1 drop into both eyes 2 (two) times daily.   3  . [DISCONTINUED] potassium chloride SA (K-DUR,KLOR-CON) 20 MEQ tablet Take by mouth.    . [DISCONTINUED] propranolol (INDERAL) 20 MG/5ML solution Take 2.5 mLs (10 mg total) by mouth 2 (two) times daily as needed. 200 mL 3  . [DISCONTINUED] TROKENDI XR 50 MG CP24 Take 1 capsule by mouth daily.     Social History   Socioeconomic History  . Marital status: Single    Spouse name: Not on file  . Number of children: Not on file  . Years of education: Not on file  . Highest education level: Not on file  Occupational History  . Not on file  Tobacco Use  . Smoking status: Former Smoker    Packs/day: 0.01    Years: 2.00    Pack years: 0.02    Types: Cigarettes    Quit date: 02/14/2014    Years since quitting: 5.4  . Smokeless tobacco: Never Used  . Tobacco comment: rare, social smoker  Substance and Sexual Activity  . Alcohol use: Yes    Alcohol/week: 0.0 standard drinks    Comment: occ  . Drug use: No  . Sexual activity: Not on file  Other Topics Concern  . Not on file  Social History Narrative  . Not on file   Social Determinants of Health   Financial Resource Strain:   . Difficulty of Paying Living Expenses:   Food Insecurity:   . Worried About Charity fundraiser in the Last Year:   . Arboriculturist in the Last Year:   Transportation Needs:   . Film/video editor (Medical):   Marland Kitchen Lack of Transportation (Non-Medical):   Physical Activity:   . Days of Exercise per Week:   . Minutes of Exercise per Session:   Stress:   . Feeling of Stress :   Social Connections:   . Frequency of Communication with Friends and Family:   . Frequency of Social Gatherings with Friends and Family:   . Attends Religious Services:   . Active Member of Clubs or Organizations:   . Attends Theatre manager Meetings:   Marland Kitchen Marital Status:   Intimate Partner Violence:   . Fear of Current or Ex-Partner:   . Emotionally Abused:   Marland Kitchen Physically Abused:   . Sexually Abused:    Family History  Problem Relation Age of Onset  . Arrhythmia Mother   . Colon cancer Neg Hx     OBJECTIVE:  Vitals:   08/03/19 1352 08/03/19 1353  BP: 117/73  Pulse: 80   Resp: 18   Temp: 98.7 F (37.1 C)   TempSrc: Oral   SpO2: 96%   Weight:  115 lb (52.2 kg)  Height:  5\' 1"  (1.549 m)    General appearance: ALERT; in no acute distress.  Head: NCAT Lungs: Normal respiratory effort CV: Dorsalis pedis pulses 2+.  Musculoskeletal: LT foot Inspection: light ecchymosis over first MTP joint Palpation: TTP over first MTP joint ROM: LROM Strength: 5/5 dorsiflexion, 5/5 plantar flexion Skin: warm and dry Neurologic: Ambulates without difficulty; Sensation intact about the lower extremities Psychological: alert and cooperative; normal mood and affect  DIAGNOSTIC STUDIES:  DG Foot Complete Left  Result Date: 08/03/2019 CLINICAL DATA:  Foot injury. EXAM: LEFT FOOT - COMPLETE 3+ VIEW COMPARISON:  11/14/2015. FINDINGS: Punctate bony density noted along the plantar lateral aspect of the distal phalanx of the left great toe. This could represent a tiny fracture chip. Diffuse mild degenerative change left great toe. No other focal bony abnormality identified. IMPRESSION: Punctate bony density noted the plantar lateral aspect of the distal phalanx of the left great toe. This could represent a very tiny fracture chip. No acute abnormality otherwise identified. Electronically Signed   By: Marcello Moores  Register   On: 08/03/2019 14:09    X-rays positive for bony fragment of distal phalanx  I have reviewed the x-rays myself and the radiologist interpretation. I am in agreement with the radiologist interpretation.     ASSESSMENT & PLAN:  1. Closed nondisplaced fracture of distal phalanx of left great toe, initial  encounter   2. Injury of toe on left foot, initial encounter    Meds ordered this encounter  Medications  . meloxicam (MOBIC) 7.5 MG tablet    Sig: Take 1 tablet (7.5 mg total) by mouth daily.    Dispense:  30 tablet    Refill:  0    Order Specific Question:   Supervising Provider    Answer:   Raylene Everts [id number]   X-rays positive for possible tiny fracture chip to LT great toe Continue conservative management of rest, ice, and elevation Take mobic as needed for pain relief (may cause abdominal discomfort, ulcers, and GI bleeds avoid taking with other NSAIDs) Follow up with podiatry Return or go to the ER if you have any new or worsening symptoms (fever, chills, chest pain, redness, swelling, bruising, worsening symptoms despite treatment, etc...)   Reviewed expectations re: course of current medical issues. Questions answered. Outlined signs and symptoms indicating need for more acute intervention. Patient verbalized understanding. After Visit Summary given.    Lestine Box, PA-C 08/03/19 1425

## 2019-08-04 ENCOUNTER — Ambulatory Visit: Payer: Medicare Other | Admitting: Podiatry

## 2019-08-14 ENCOUNTER — Ambulatory Visit: Payer: Medicare Other | Admitting: Podiatry

## 2019-09-30 DIAGNOSIS — N946 Dysmenorrhea, unspecified: Secondary | ICD-10-CM | POA: Diagnosis not present

## 2019-09-30 DIAGNOSIS — F4000 Agoraphobia, unspecified: Secondary | ICD-10-CM | POA: Diagnosis not present

## 2019-09-30 DIAGNOSIS — J4550 Severe persistent asthma, uncomplicated: Secondary | ICD-10-CM | POA: Diagnosis not present

## 2019-09-30 DIAGNOSIS — Z1389 Encounter for screening for other disorder: Secondary | ICD-10-CM | POA: Diagnosis not present

## 2019-09-30 DIAGNOSIS — F419 Anxiety disorder, unspecified: Secondary | ICD-10-CM | POA: Diagnosis not present

## 2019-10-01 DIAGNOSIS — E559 Vitamin D deficiency, unspecified: Secondary | ICD-10-CM | POA: Diagnosis not present

## 2019-10-01 DIAGNOSIS — R000 Tachycardia, unspecified: Secondary | ICD-10-CM | POA: Diagnosis not present

## 2019-10-01 DIAGNOSIS — J45909 Unspecified asthma, uncomplicated: Secondary | ICD-10-CM | POA: Diagnosis not present

## 2019-10-01 DIAGNOSIS — D510 Vitamin B12 deficiency anemia due to intrinsic factor deficiency: Secondary | ICD-10-CM | POA: Diagnosis not present

## 2019-10-01 DIAGNOSIS — M792 Neuralgia and neuritis, unspecified: Secondary | ICD-10-CM | POA: Diagnosis not present

## 2019-10-01 DIAGNOSIS — F339 Major depressive disorder, recurrent, unspecified: Secondary | ICD-10-CM | POA: Diagnosis not present

## 2019-10-01 DIAGNOSIS — I10 Essential (primary) hypertension: Secondary | ICD-10-CM | POA: Diagnosis not present

## 2019-10-01 DIAGNOSIS — D649 Anemia, unspecified: Secondary | ICD-10-CM | POA: Diagnosis not present

## 2019-12-28 ENCOUNTER — Telehealth: Payer: Self-pay | Admitting: Radiology

## 2019-12-28 ENCOUNTER — Telehealth (INDEPENDENT_AMBULATORY_CARE_PROVIDER_SITE_OTHER): Payer: Medicare Other | Admitting: Internal Medicine

## 2019-12-28 VITALS — Ht 61.0 in | Wt 120.8 lb

## 2019-12-28 DIAGNOSIS — R000 Tachycardia, unspecified: Secondary | ICD-10-CM | POA: Diagnosis not present

## 2019-12-28 DIAGNOSIS — R002 Palpitations: Secondary | ICD-10-CM | POA: Diagnosis not present

## 2019-12-28 NOTE — Patient Instructions (Signed)
Medication Instructions:  No Changes In Medications at this time.  *If you need a refill on your cardiac medications before your next appointment, please call your pharmacy*  Lab Work: None Ordered At This Time.  If you have labs (blood work) drawn today and your tests are completely normal, you will receive your results only by: Marland Kitchen MyChart Message (if you have MyChart) OR . A paper copy in the mail If you have any lab test that is abnormal or we need to change your treatment, we will call you to review the results.  Testing/Procedures:  [employer] Cardiac Event Monitor Instructions Your physician has requested you wear your cardiac event monitor for 14 days, (1-30). [employer] may call or text to confirm a shipping address. The monitor will be sent to a land address via UPS. [employer] will not ship a monitor to a PO BOX. It typically takes 3-5 days to receive your monitor after it has been enrolled. [employer] will assist with USPS tracking if your package is delayed. The telephone number for [employer] is [phone]. Once you have received your monitor, please review the enclosed instructions. Instruction tutorials can also be viewed under help and settings on the enclosed cell phone. Your monitor has already been registered assigning a specific monitor serial # to you.  Applying the monitor Remove cell phone from case and turn it on. The cell phone works as Dealer and needs to be within [employer] of you at all times. The cell phone will need to be charged on a daily basis. We recommend you plug the cell phone into the enclosed charger at your bedside table every night.  Monitor batteries: You will receive two monitor batteries labelled #1 and #2. These are your recorders. Plug battery #2 onto the second connection on the enclosed charger. Keep one battery on the charger at all times. This will keep the monitor battery deactivated. It will also keep it fully charged for  when you need to switch your monitor batteries. A small light will be blinking on the battery emblem when it is charging. The light on the battery emblem will remain on when the battery is fully charged.  Open package of a Monitor strip. Insert battery #1 into black hood on strip and gently squeeze monitor battery onto connection as indicated in instruction booklet. Set aside while preparing skin.  Choose location for your strip, vertical or horizontal, as indicated in the instruction booklet. Shave to remove all hair from location. There cannot be any lotions, oils, powders, or colognes on skin where monitor is to be applied. Wipe skin clean with enclosed Saline wipe. Dry skin completely.  Peel paper labeled #1 off the back of the Monitor strip exposing the adhesive. Place the monitor on the chest in the vertical or horizontal position shown in the instruction booklet. One arrow on the monitor strip must be pointing upward. Carefully remove paper labeled #2, attaching remainder of strip to your skin. Try not to create any folds or wrinkles in the strip as you apply it.  Firmly press and release the circle in the center of the monitor battery. You will hear a small beep. This is turning the monitor battery on. The heart emblem on the monitor battery will light up every 5 seconds if the monitor battery in turned on and connected to the patient securely. Do not push and hold the circle down as this turns the monitor battery off. The cell phone will locate the monitor battery. A  screen will appear on the cell phone checking the connection of your monitor strip. This may read poor connection initially but change to good connection within the next minute. Once your monitor accepts the connection you will hear a series of 3 beeps followed by a climbing crescendo of beeps. A screen will appear on the cell phone showing the two monitor strip placement options. Touch the picture that demonstrates where  you applied the monitor strip.  Your monitor strip and battery are waterproof. You are able to shower, bathe, or swim with the monitor on. They just ask you do not submerge deeper than 3 feet underwater. We recommend removing the monitor if you are swimming in a lake, river, or ocean.  Your monitor battery will need to be switched to a fully charged monitor battery approximately once a week. The cell phone will alert you of an action which needs to be made.  On the cell phone, tap for details to reveal connection status, monitor battery status, and cell phone battery status. The green dots indicates your monitor is in good status. A red dot indicates there is something that needs your attention.  To record a symptom, click the circle on the monitor battery. In 30-60 seconds a list of symptoms will appear on the cell phone. Select your symptom and tap save. Your monitor will record a sustained or significant arrhythmia regardless of you clicking the button. Some patients do not feel the heart rhythm irregularities. [employer] will notify us of any serious or critical events.  Refer to instruction booklet for instructions on switching batteries, changing strips, the Do not disturb or Pause features, or any additional questions.  Call [employer] at [phone], to confirm your monitor is transmitting and record your baseline. They will answer any questions you may have regarding the monitor instructions at that time.  Returning the monitor to [place] all equipment back into blue box. Peel off strip of paper to expose adhesive and close box securely. There is a prepaid UPS shipping label on this box. Drop in a UPS drop box, or at a UPS facility like [employer]. You may also contact [employer] to arrange UPS to pick up monitor package at your home.  Follow-Up: At [facility], you and your health needs are our priority.  As part of our continuing mission to provide you with  exceptional heart care, we have created designated Provider Care Teams.  These Care Teams include your primary Cardiologist (physician) and Advanced Practice Providers (APPs -  Physician Assistants and Nurse Practitioners) who all work together to provide you with the care you need, when you need it.  Your next appointment:   FOLLOW UP AFTER PREVENTICE MONITOR   The format for your next appointment:   In Person  Provider:   DR. Caryl Comes   Other Instructions YOU ARE BEING REFERRED TO DR. Millen

## 2019-12-28 NOTE — Progress Notes (Signed)
Virtual Visit via Telephone Note   This visit type was conducted due to national recommendations for restrictions regarding the COVID-19 Pandemic (e.g. social distancing) in an effort to limit this patient's exposure and mitigate transmission in our community.  Due to her co-morbid illnesses, this patient is at least at moderate risk for complications without adequate follow up.  This format is felt to be most appropriate for this patient at this time.  The patient did not have access to video technology/had technical difficulties with video requiring transitioning to audio format only (telephone).  All issues noted in this document were discussed and addressed.  No physical exam could be performed with this format.  Please refer to the patient's chart for her  consent to telehealth for [facility].    Date:  12/28/2019   ID:  Traci Hensley, DOB 1990/09/06, MRN [id number] The patient was identified using 2 identifiers.  Patient Location: Home Provider Location: Home Office  PCP:  Martin Majestic, FNP  Cardiologist:  Kate Sable, MD (Inactive)  Electrophysiologist:  None   Evaluation Performed:  Follow-Up Visit  Chief Complaint:  Palpitations.  History of Present Illness:    Traci Hensley is a 29 y.o. female with PTSD and history of palpitations who has been seen by several members of our practice, most recently Dr. Cristopher Peru in EP and Dr. Kate Sable. She was last seen in our group on 01/02/2018 after an ER visit. She presented with tachycardia - collapsed in ER. BP was elevated as well. Feels like she passed out from PVCs and fast HR.  Loop recorder has been discussed but unable to go forward due to patient deferring. We discussed details of loop recorder implant, she declines. She is concerned about procedural details and her contraindications to many medications and sedating agents.  Scheduled ETT after being evalated at [facility], but broke foot so doesn't  want to go back to [facility].   Felt like she had an heart attack at 30 yo but can't get the records, tells me her doctor is unable to release them.  Daily symptoms, feels like "only part of heart beats", and that she has double beats. Chest pressure from palpitations. Has to be sitting to when having palpitations. We discussed possibly obtaining a 2 week monitor. She has had telemetry monitoring in the past with no malignant arrhythmia.   Offered EP appointment. Patient will consider.  Offered propranolol, however she does not want to take because of hypotension.   The patient does not have symptoms concerning for COVID-19 infection (fever, chills, cough, or new shortness of breath).    Past Medical History:  Diagnosis Date  . Agoraphobia   . Anxiety   . Bipolar 1 disorder ([place])   . Chronic abdominal pain   . Chronic chest pain   . Depression   . PTSD (post-traumatic stress disorder)   . Scoliosis    Past Surgical History:  Procedure Laterality Date  . MOUTH SURGERY       Current Meds  Medication Sig  . ADVAIR DISKUS 500-50 MCG/DOSE AEPB Inhale 1 puff into the lungs 2 (two) times daily as needed (for shortness of breath).   Marland Kitchen albuterol (PROVENTIL HFA;VENTOLIN HFA) 108 (90 Base) MCG/ACT inhaler Inhale into the lungs every 6 (six) hours as needed for wheezing or shortness of breath.  . baclofen (LIORESAL) 10 MG tablet Take 10 mg by mouth 2 (two) times daily as needed for muscle spasms.   . D3-50 50000  units capsule Take 1 capsule 2 x a week  . diclofenac sodium (VOLTAREN) 1 % GEL Apply 4 g topically 4 (four) times daily.   Marland Kitchen EPINEPHrine 0.3 mg/0.3 mL IJ SOAJ injection Inject 0.3 mg into the muscle once. As needed for allergic reaction  . gabapentin (NEURONTIN) 300 MG capsule Take 300 mg by mouth 3 (three) times daily as needed (pain).   Marland Kitchen ipratropium-albuterol (DUONEB) 0.5-2.5 (3) MG/3ML SOLN USE 1 SOLUTION IN NEBULIZER 4 TIMES DAILY AS NEEDED  . levalbuterol (XOPENEX HFA) 45 MCG/ACT  inhaler Inhale 2 puffs into the lungs every 8 (eight) hours as needed for wheezing or shortness of breath.   . levalbuterol (XOPENEX) 1.25 MG/3ML nebulizer solution Take 1.25 mg by nebulization every 8 (eight) hours as needed for wheezing or shortness of breath.   . nitroGLYCERIN (NITROSTAT) 0.4 MG SL tablet Place 1 tablet (0.4 mg total) under the tongue every 5 (five) minutes as needed for chest pain.  . NONFORMULARY OR COMPOUNDED ITEM Apply 1 application topically 4 (four) times daily. Katamine/Gabapentin/Baclofen/Lidocaine/Menthol Cream  . norgestimate-ethinyl estradiol (ESTARYLLA) 0.25-35 MG-MCG tablet Take 1 tablet by mouth daily.  . RESTASIS MULTIDOSE 0.05 % ophthalmic emulsion Place 1 drop into both eyes 2 (two) times daily.      Allergies:   Adhesive [tape], Blue dyes (parenteral), Contrast media [iodinated diagnostic agents], Corn-containing products, Green dyes, Nickel, Onion, Other, Petroleum distillate, Silicone, Bee venom, Cortisporin [bacitra-neomycin-polymyxin-hc], and Yellow jacket venom   Social History   Tobacco Use  . Smoking status: Former Smoker    Packs/day: 0.01    Years: 2.00    Pack years: 0.02    Types: Cigarettes    Quit date: 02/14/2014    Years since quitting: 5.8  . Smokeless tobacco: Never Used  . Tobacco comment: rare, social smoker  Vaping Use  . Vaping Use: Some days  Substance Use Topics  . Alcohol use: Yes    Alcohol/week: 0.0 standard drinks    Comment: occ  . Drug use: No     Family Hx: The patient's family history includes Arrhythmia in her mother. There is no history of Colon cancer.  ROS:   Please see the history of present illness.     All other systems reviewed and are negative.   Prior CV studies:   The following studies were reviewed today:    Labs/Other Tests and Data Reviewed:    EKG:  Representative sample of ecgs dating back to 2014 reviewed in conjunction with this consultation.  12/28/2017 - sinus tachycardia, rate 122  bpm.  Recent Labs: No results found for requested labs within last 8760 hours.   Recent Lipid Panel No results found for: CHOL, TRIG, HDL, CHOLHDL, LDLCALC, LDLDIRECT  Wt Readings from Last 3 Encounters:  12/28/19 120 lb 12.8 oz (54.8 kg)  08/03/19 115 lb (52.2 kg)  03/14/18 115 lb (52.2 kg)     Risk Assessment/Calculations:      Objective:    Vital Signs:  Ht 5\' 1"  (1.549 m)   Wt 120 lb 12.8 oz (54.8 kg)   BMI 22.82 kg/m    VITAL SIGNS:  reviewed GEN:  no acute distress RESPIRATORY:  normal respiratory effort, no increased work of breathing NEURO:  alert and oriented x 3, speech normal PSYCH:  Frustrated in speech.   ASSESSMENT & PLAN:     1. Palpitations   2. Sinus tachycardia    Difficult situation given our inability to examine patient in office due to PTSD and difficulty  wearing a mask. Patient would prefer not to come to office for an examination, nor go to EP office for consultation for POTS evaluation. We discussed conservative measures for POTS as detailed below.  I offered her a repeat 2 week monitor since symptoms feel different than prior evaluations to some degree. She would like to go forward with monitor to evaluate any change from prior monitor evaluations.   Consider repeat echocardiogram if patient would like.   Follow up in EP clinic if virtual visit able to be arranged.  Postural Orthostatic Tachycardia Syndrome: Spent >20 minutes counseling specifically on the etiology, diagnosis, and symptom management of POTS. The following recommendations were emphasized: -avoid dehydration. Often it requires high volumes of fluids, often with salt/electrolytes included, to stay hydrated. People with POTS are very sensitive to fluid shifts and dehydration. Oral rehydration is preferred, and routine use of IV fluids is not recommended. -if tolerated, compression stocking can assist with fluid management and prevent pooling in the legs. -slow position changes are  recommended -if there is a feeling of severe lightheadedness, like near to passing out, recommend lying on the floor on the back, with legs elevated up on a chair or up against the wall. -the best long term management of POTS symptoms is gradual exercise conditioning. I recommend seated exercises such as bike to start, to avoid the risk of falling with lightheadedness. Exercise programs, either through supervised programs like cardiac rehab or through personal programs, should focus on gradually increasing exercise tolerance and conditioning.  -we discussed the typical spectrum of dysautonomia, including typical populations, that this sometimes spontaneously improves with age (though a small percentage have persistent symptoms), that this has uncomfortable symptoms but is not associated with long term mortality, and that the etiology/treatment of this is an area of active research    COVID-19 Education: The signs and symptoms of COVID-19 were discussed with the patient and how to seek care for testing (follow up with PCP or arrange E-visit).  The importance of social distancing was discussed today.  Time:   Today, I have spent 26 minutes with the patient with telehealth technology discussing the above problems.    Patient Instructions  Medication Instructions:  No Changes In Medications at this time.  *If you need a refill on your cardiac medications before your next appointment, please call your pharmacy*  Lab Work: None Ordered At This Time.  If you have labs (blood work) drawn today and your tests are completely normal, you will receive your results only by: Marland Kitchen MyChart Message (if you have MyChart) OR . A paper copy in the mail If you have any lab test that is abnormal or we need to change your treatment, we will call you to review the results.  Testing/Procedures:  [employer] Cardiac Event Monitor Instructions Your physician has requested you wear your cardiac event monitor for 14  days, (1-30). [employer] may call or text to confirm a shipping address. The monitor will be sent to a land address via UPS. [employer] will not ship a monitor to a PO BOX. It typically takes 3-5 days to receive your monitor after it has been enrolled. [employer] will assist with USPS tracking if your package is delayed. The telephone number for [employer] is [phone]. Once you have received your monitor, please review the enclosed instructions. Instruction tutorials can also be viewed under help and settings on the enclosed cell phone. Your monitor has already been registered assigning a specific monitor serial # to you.  Applying  the monitor Remove cell phone from case and turn it on. The cell phone works as Dealer and needs to be within [employer] of you at all times. The cell phone will need to be charged on a daily basis. We recommend you plug the cell phone into the enclosed charger at your bedside table every night.  Monitor batteries: You will receive two monitor batteries labelled #1 and #2. These are your recorders. Plug battery #2 onto the second connection on the enclosed charger. Keep one battery on the charger at all times. This will keep the monitor battery deactivated. It will also keep it fully charged for when you need to switch your monitor batteries. A small light will be blinking on the battery emblem when it is charging. The light on the battery emblem will remain on when the battery is fully charged.  Open package of a Monitor strip. Insert battery #1 into black hood on strip and gently squeeze monitor battery onto connection as indicated in instruction booklet. Set aside while preparing skin.  Choose location for your strip, vertical or horizontal, as indicated in the instruction booklet. Shave to remove all hair from location. There cannot be any lotions, oils, powders, or colognes on skin where monitor is to be applied. Wipe skin clean with enclosed  Saline wipe. Dry skin completely.  Peel paper labeled #1 off the back of the Monitor strip exposing the adhesive. Place the monitor on the chest in the vertical or horizontal position shown in the instruction booklet. One arrow on the monitor strip must be pointing upward. Carefully remove paper labeled #2, attaching remainder of strip to your skin. Try not to create any folds or wrinkles in the strip as you apply it.  Firmly press and release the circle in the center of the monitor battery. You will hear a small beep. This is turning the monitor battery on. The heart emblem on the monitor battery will light up every 5 seconds if the monitor battery in turned on and connected to the patient securely. Do not push and hold the circle down as this turns the monitor battery off. The cell phone will locate the monitor battery. A screen will appear on the cell phone checking the connection of your monitor strip. This may read poor connection initially but change to good connection within the next minute. Once your monitor accepts the connection you will hear a series of 3 beeps followed by a climbing crescendo of beeps. A screen will appear on the cell phone showing the two monitor strip placement options. Touch the picture that demonstrates where you applied the monitor strip.  Your monitor strip and battery are waterproof. You are able to shower, bathe, or swim with the monitor on. They just ask you do not submerge deeper than 3 feet underwater. We recommend removing the monitor if you are swimming in a lake, river, or ocean.  Your monitor battery will need to be switched to a fully charged monitor battery approximately once a week. The cell phone will alert you of an action which needs to be made.  On the cell phone, tap for details to reveal connection status, monitor battery status, and cell phone battery status. The green dots indicates your monitor is in good status. A red dot indicates  there is something that needs your attention.  To record a symptom, click the circle on the monitor battery. In 30-60 seconds a list of symptoms will appear on the cell phone. Select  your symptom and tap save. Your monitor will record a sustained or significant arrhythmia regardless of you clicking the button. Some patients do not feel the heart rhythm irregularities. [employer] will notify us of any serious or critical events.  Refer to instruction booklet for instructions on switching batteries, changing strips, the Do not disturb or Pause features, or any additional questions.  Call [employer] at [phone], to confirm your monitor is transmitting and record your baseline. They will answer any questions you may have regarding the monitor instructions at that time.  Returning the monitor to [place] all equipment back into blue box. Peel off strip of paper to expose adhesive and close box securely. There is a prepaid UPS shipping label on this box. Drop in a UPS drop box, or at a UPS facility like [employer]. You may also contact [employer] to arrange UPS to pick up monitor package at your home.  Follow-Up: At [facility], you and your health needs are our priority.  As part of our continuing mission to provide you with exceptional heart care, we have created designated Provider Care Teams.  These Care Teams include your primary Cardiologist (physician) and Advanced Practice Providers (APPs -  Physician Assistants and Nurse Practitioners) who all work together to provide you with the care you need, when you need it.  Your next appointment:   FOLLOW UP AFTER PREVENTICE MONITOR   The format for your next appointment:   In Person  Provider:   DR. Caryl Comes   Other Instructions YOU ARE BEING REFERRED TO DR. KLEIN- ELECTROPHYSIOLOGY    Signed, Elouise Munroe, MD  12/28/2019 9:42 AM    [facility]

## 2019-12-28 NOTE — Telephone Encounter (Addendum)
Enrolled patient for a 13 day [employer] Event Monitor to be mailed to patients home. Per HPOA monitor being sent to [address], [place], [address]

## 2019-12-31 DIAGNOSIS — R636 Underweight: Secondary | ICD-10-CM | POA: Diagnosis not present

## 2019-12-31 DIAGNOSIS — E785 Hyperlipidemia, unspecified: Secondary | ICD-10-CM | POA: Diagnosis not present

## 2020-01-05 ENCOUNTER — Telehealth: Payer: Self-pay

## 2020-01-05 NOTE — Telephone Encounter (Signed)
Outreach made to Pt's PCP.  Per PCP- she has not provided Pt with a medical exemption stating she does not need to wear a mask.

## 2020-01-08 ENCOUNTER — Encounter (INDEPENDENT_AMBULATORY_CARE_PROVIDER_SITE_OTHER): Payer: Medicare Other

## 2020-01-08 DIAGNOSIS — R002 Palpitations: Secondary | ICD-10-CM | POA: Diagnosis not present

## 2020-01-08 DIAGNOSIS — R000 Tachycardia, unspecified: Secondary | ICD-10-CM

## 2020-01-19 ENCOUNTER — Telehealth: Payer: Self-pay | Admitting: Internal Medicine

## 2020-01-19 NOTE — Telephone Encounter (Signed)
Spoke with Traci Hensley regarding monitor that was placed on Traci Hensley and she states the adhesive strips is causing a skin allergic reaction and is tearing her skin when it comes off.  She is requesting the cream to help heal the wounds and to notify the Doctor she can not keep the monitor on but for 2 days then leave off 2-3 days.  The results will not have 14 consistent days of data.

## 2020-01-19 NOTE — Telephone Encounter (Signed)
Spoke with patient's MPOA. Patient only able to wear heart monitor for two days due to severe adhesive and nickel allergy. She has a wound from the monitor.  The nickel allergy then causes a petroleum allergy and patient cannot use neosporin to heal her wounds. She is requesting silvadene cream to heal the wound caused by the heart monitor. They are going to mail the monitor back in.   Patient utilizes a smart watch and believes the watch has picked up some of the high and low heart rates. Requested data be sent in through [place] if possible.   Patient also wants to know how the office visit will work. She has a mass on her pituitary gland and they struggle to keep her oxygen above 90% on supplemental O2. They report they usually have patient sit in the car until she's called back and then she runs through the lobby to the room and removes her mask. They are happy to continue virtual appointments if this will be an issue.   Will route to MD and primary RN for review. Patient uses the [facility] in [place].

## 2020-01-20 NOTE — Telephone Encounter (Signed)
Spoke with Traci Hensley (okay per DPR), advised her of PharmD's recommendation regarding the silvadene cream. Traci Hensley adamantly requesting prescription for Silvadene as she states "this is the absolute only thing that [name] is not allergic to". Traci Hensley states that patient is allergic to all adhesive, petroleum, nickel and therefore can not use anything at all over the counter. Spoke with Dr. Margaretann Loveless regarding patient and issue and per Dr. Margaretann Loveless the patient should be seen by PCP to evaluate the wound to determine treatment as the patient has not been examined in person. Traci Hensley states that patients PCP is too busy to get patient in to be seen and states that it would be weeks before patient could be seen. Traci Hensley states that the patient can not be vaccinated therefore they do not want the patient out due to possible exposure to covid. Traci Hensley still very adamantly requesting Silvadene prescription at this time. Traci Hensley states that they only want the silvadene prescription. Will forward to MD for further advice.

## 2020-01-20 NOTE — Telephone Encounter (Signed)
Routing to MD.

## 2020-01-20 NOTE — Telephone Encounter (Signed)
Agree with recommendations from [facility].   Belinda Block RN was able to contact the patient, unfortunately patient and MPOA were very upset about these recommendations.  It would be difficult to write a prescription for a wound we have not been able to evaluate, and it would be best medical practice to evaluate this in person, particularly since we have not met the patient before. I would recommend being seen either in our office, or better would be evaluation with her PCP, dermatologist, wound care clinic or ED, as this would be more their area of expertise.   I will review monitor results as they are made available.

## 2020-01-20 NOTE — Telephone Encounter (Signed)
Patient's MPOA calling back for the silvadene cream. She states the patient is allergic to everything else and needs the cream for the wound created by the monitor. She states it is an open wound and the patient needs the cream to heal it.

## 2020-01-20 NOTE — Telephone Encounter (Signed)
Silvadene is used to prevent infection in wounds/burns, not to help them heal faster.  Unless we can see the wound and signs of infections, it is really not necessary.  She would be better to keep the wound clean and dry and let it heal naturally.  Looks like she wore a holter monitor in 2020 at [facility] cardiology [facility]).

## 2020-01-21 ENCOUNTER — Other Ambulatory Visit: Payer: Self-pay | Admitting: Neurology

## 2020-01-21 DIAGNOSIS — M5416 Radiculopathy, lumbar region: Secondary | ICD-10-CM | POA: Diagnosis not present

## 2020-01-21 DIAGNOSIS — L989 Disorder of the skin and subcutaneous tissue, unspecified: Secondary | ICD-10-CM | POA: Diagnosis not present

## 2020-01-21 DIAGNOSIS — G603 Idiopathic progressive neuropathy: Secondary | ICD-10-CM | POA: Diagnosis not present

## 2020-01-21 DIAGNOSIS — F4001 Agoraphobia with panic disorder: Secondary | ICD-10-CM | POA: Diagnosis not present

## 2020-01-21 DIAGNOSIS — G4489 Other headache syndrome: Secondary | ICD-10-CM | POA: Diagnosis not present

## 2020-01-21 DIAGNOSIS — M4134 Thoracogenic scoliosis, thoracic region: Secondary | ICD-10-CM | POA: Diagnosis not present

## 2020-01-21 DIAGNOSIS — D497 Neoplasm of unspecified behavior of endocrine glands and other parts of nervous system: Secondary | ICD-10-CM

## 2020-01-21 DIAGNOSIS — G6289 Other specified polyneuropathies: Secondary | ICD-10-CM | POA: Diagnosis not present

## 2020-01-21 NOTE — Telephone Encounter (Signed)
Spoke with Marijo MPOA in regards to Dr. Delphina Cahill recommendation of having the wound evaluated either in office or by PCP. Marijo states that patient was seen today by neurology and that the medication cream for wound was prescribed by them. Marijo stated that "patient was given the the run around in treating the patients wound instead of just ordering the prescription cream". Advised Marijo that patient was made aware of Dr. Delphina Cahill recommendation of having the wound evaluated for best treatment of the wound as the wound had not been seen by provider. Offered to make an appointment for patient to be evaluated in office by Dr. Margaretann Loveless but Idalia Needle declined and stated that patient can only be Virtual due to not being able to be vaccinated for Covid and patient not being able to wear a mask. Advised Marijo that I would forward message to Dr. Margaretann Loveless. Advised Marijo to let us know if there are any other questions or concerns. Marijo expressed gratitude for the return call.

## 2020-01-24 ENCOUNTER — Ambulatory Visit
Admission: RE | Admit: 2020-01-24 | Discharge: 2020-01-24 | Disposition: A | Discharge status: Home / Self Care | Payer: Medicare Other | Source: Ambulatory Visit | Attending: Neurology | Admitting: Neurology

## 2020-01-24 ENCOUNTER — Other Ambulatory Visit: Payer: Self-pay

## 2020-01-24 DIAGNOSIS — D497 Neoplasm of unspecified behavior of endocrine glands and other parts of nervous system: Secondary | ICD-10-CM

## 2020-01-24 DIAGNOSIS — E237 Disorder of pituitary gland, unspecified: Secondary | ICD-10-CM | POA: Diagnosis not present

## 2020-01-26 ENCOUNTER — Encounter (HOSPITAL_COMMUNITY): Payer: Self-pay | Admitting: Emergency Medicine

## 2020-01-26 ENCOUNTER — Emergency Department (HOSPITAL_COMMUNITY): Payer: Medicare Other

## 2020-01-26 ENCOUNTER — Other Ambulatory Visit: Payer: Self-pay

## 2020-01-26 DIAGNOSIS — R064 Hyperventilation: Secondary | ICD-10-CM | POA: Diagnosis not present

## 2020-01-26 DIAGNOSIS — Z5321 Procedure and treatment not carried out due to patient leaving prior to being seen by health care provider: Secondary | ICD-10-CM | POA: Insufficient documentation

## 2020-01-26 DIAGNOSIS — R002 Palpitations: Secondary | ICD-10-CM | POA: Diagnosis not present

## 2020-01-26 LAB — CBC
HCT: 40.6 % (ref 36.0–46.0)
Hemoglobin: 13.4 g/dL (ref 12.0–15.0)
MCH: 30.9 pg (ref 26.0–34.0)
MCHC: 33 g/dL (ref 30.0–36.0)
MCV: 93.5 fL (ref 80.0–100.0)
Platelets: 325 10*3/uL (ref 150–400)
RBC: 4.34 MIL/uL (ref 3.87–5.11)
RDW: 11.8 % (ref 11.5–15.5)
WBC: 7.9 10*3/uL (ref 4.0–10.5)
nRBC: 0 % (ref 0.0–0.2)

## 2020-01-26 NOTE — ED Triage Notes (Signed)
Pt c/o palpitations that started 40 minutes ago. Pt is hyperventilating in triage.

## 2020-01-27 ENCOUNTER — Emergency Department (HOSPITAL_COMMUNITY)
Admission: EM | Admit: 2020-01-27 | Discharge: 2020-01-27 | Disposition: A | Discharge status: Home / Self Care | Payer: Medicare Other | Attending: Emergency Medicine | Admitting: Emergency Medicine

## 2020-01-27 LAB — BASIC METABOLIC PANEL
Anion gap: 9 (ref 5–15)
BUN: 14 mg/dL (ref 6–20)
CO2: 23 mmol/L (ref 22–32)
Calcium: 9.5 mg/dL (ref 8.9–10.3)
Chloride: 105 mmol/L (ref 98–111)
Creatinine, Ser: 0.58 mg/dL (ref 0.44–1.00)
GFR, Estimated: >60 mL/min (ref >60)
Glucose, Bld: 121 mg/dL — ABNORMAL HIGH (ref 70–99)
Potassium: 3.5 mmol/L (ref 3.5–5.1)
Sodium: 137 mmol/L (ref 135–145)

## 2020-01-27 LAB — TROPONIN I (HIGH SENSITIVITY): Troponin I (High Sensitivity): <2 ng/L (ref <18)

## 2020-01-27 NOTE — ED Notes (Signed)
Per screener pt is not in waiting room, bathroom, or outside.

## 2020-02-11 ENCOUNTER — Ambulatory Visit: Payer: Medicare Other | Admitting: Internal Medicine

## 2020-03-16 ENCOUNTER — Emergency Department (HOSPITAL_COMMUNITY)
Admission: EM | Admit: 2020-03-16 | Discharge: 2020-03-16 | Disposition: A | Discharge status: Home / Self Care | Payer: Medicare Other | Attending: Emergency Medicine | Admitting: Emergency Medicine

## 2020-03-16 ENCOUNTER — Ambulatory Visit
Admission: EM | Admit: 2020-03-16 | Discharge: 2020-03-16 | Disposition: A | Discharge status: Home / Self Care | Payer: Medicare Other

## 2020-03-16 ENCOUNTER — Encounter: Payer: Self-pay | Admitting: Emergency Medicine

## 2020-03-16 ENCOUNTER — Encounter (HOSPITAL_COMMUNITY): Payer: Self-pay | Admitting: Emergency Medicine

## 2020-03-16 ENCOUNTER — Other Ambulatory Visit: Payer: Self-pay

## 2020-03-16 DIAGNOSIS — F17210 Nicotine dependence, cigarettes, uncomplicated: Secondary | ICD-10-CM | POA: Diagnosis not present

## 2020-03-16 DIAGNOSIS — H00033 Abscess of eyelid right eye, unspecified eyelid: Secondary | ICD-10-CM

## 2020-03-16 DIAGNOSIS — H02846 Edema of left eye, unspecified eyelid: Secondary | ICD-10-CM | POA: Diagnosis not present

## 2020-03-16 DIAGNOSIS — H5712 Ocular pain, left eye: Secondary | ICD-10-CM | POA: Diagnosis not present

## 2020-03-16 MED ORDER — TOBRAMYCIN 0.3 % OP SOLN: 1.0000 [drp] | OPHTHALMIC | 0 refills | Status: AC

## 2020-03-16 NOTE — ED Provider Notes (Signed)
Patient were disrespectful, yelling and rude at the office.  They were kindly advised  and redirected to go to the ER for further evaluation regarding their symptoms   Durward Parcel, FNP 03/16/20 1242

## 2020-03-16 NOTE — ED Notes (Signed)
Patient is being discharged from the Urgent Care and sent to the Emergency Department via pov . Per K. Avegno, patient is in need of higher level of care due to eye pain. Patient is aware and verbalizes understanding of plan of care.  Vitals:   03/16/20 1117  BP: 115/79  Pulse: 77  Resp: 20  Temp: 98.9 F (37.2 C)  SpO2: 98%

## 2020-03-16 NOTE — ED Triage Notes (Signed)
Pt to the ED with her mother whom is the POA.  Pt has bilateral eye swelling and pain that began last night.  Pt was seen at [facility] and sent to ED for evaluation of eye pressure.

## 2020-03-16 NOTE — ED Provider Notes (Signed)
Patient was redirected to go to the ER for further evaluation as there was a complaint of eye pain.  Eye pressure need to be measured as there is no  tonometer at the urgent care site.     Durward Parcel, FNP 03/16/20 1215

## 2020-03-16 NOTE — Discharge Instructions (Signed)
You may be developing a stye. Soak eyelid 4 times a day

## 2020-03-16 NOTE — ED Triage Notes (Signed)
Pt presents with her mother who reports being her spokesperson. Pt is able to communicate and c/o of bilateral eye pain, greater on left. Denies injury.

## 2020-03-22 NOTE — ED Provider Notes (Signed)
[facility] EMERGENCY DEPARTMENT Provider Note   CSN: [id number] Arrival date & time: 03/16/20  1222     History Chief Complaint  Patient presents with  . Eye Pain    Traci Hensley is a 30 y.o. female.  The history is provided by the patient. No language interpreter was used.  Eye Pain This is a new problem. The current episode started 12 to 24 hours ago. The problem occurs constantly. The problem has not changed since onset.Nothing aggravates the symptoms. Nothing relieves the symptoms. She has tried nothing for the symptoms. The treatment provided no relief.   Pt complains of swelling to her eyelid     Past Medical History:  Diagnosis Date  . Agoraphobia   . Anxiety   . Bipolar 1 disorder ([place])   . Chronic abdominal pain   . Chronic chest pain   . Depression   . PTSD (post-traumatic stress disorder)   . Scoliosis     Patient Active Problem List   Diagnosis Date Noted  . History of enlargement of pituitary gland 12/25/2017  . Moderate malnutrition ([place]) 12/25/2017  . Loss of weight 12/25/2017  . Vitamin D deficiency 12/25/2017  . Cervical spondylosis 11/27/2017  . Chronic fatigue 11/27/2017  . Family history of lupus erythematosus 11/27/2017  . Thoracic spondylosis without myelopathy 11/27/2017  . Ingrown toenail 11/23/2015  . Dyspepsia 08/31/2014  . Abdominal pain 06/29/2014  . Achilles tendinitis of both lower extremities 01/23/2014  . Palpitations 04/19/2013  . Chest pain 04/19/2013  . Sinus tachycardia 04/19/2013  . Anxiety 04/19/2013    Past Surgical History:  Procedure Laterality Date  . MOUTH SURGERY       OB History   No obstetric history on file.     Family History  Problem Relation Age of Onset  . Arrhythmia Mother   . Colon cancer Neg Hx     Social History   Tobacco Use  . Smoking status: Current Some Day Smoker    Packs/day: 0.01    Years: 2.00    Pack years: 0.02    Types: Cigarettes    Last attempt to quit: 02/14/2014     Years since quitting: 6.1  . Smokeless tobacco: Never Used  . Tobacco comment: rare, social smoker  Vaping Use  . Vaping Use: Some days  Substance Use Topics  . Alcohol use: Yes    Alcohol/week: 0.0 standard drinks    Comment: occ  . Drug use: No    Home Medications Prior to Admission medications   Medication Sig Start Date End Date Taking? Authorizing Provider  tobramycin (TOBREX) 0.3 % ophthalmic solution Place 1 drop into the right eye every 4 (four) hours for 10 days. 03/16/20 03/26/20 Yes Caryl Ada K, PA-C  ADVAIR DISKUS 500-50 MCG/DOSE AEPB Inhale 1 puff into the lungs 2 (two) times daily as needed (for shortness of breath).  07/25/14   [provider]  albuterol (PROVENTIL HFA;VENTOLIN HFA) 108 (90 Base) MCG/ACT inhaler Inhale into the lungs every 6 (six) hours as needed for wheezing or shortness of breath.    [provider]  baclofen (LIORESAL) 10 MG tablet Take by mouth 2 (two) times daily as needed for muscle spasms. 2 tabs twice daily PRN 08/09/17   [provider]  D3-50 50000 units capsule Take 1 capsule 2 x a week 01/01/18   Cassandria Anger, MD  diclofenac sodium (VOLTAREN) 1 % GEL Apply 4 g topically 4 (four) times daily.  08/09/17  [provider]  EPINEPHrine 0.3 mg/0.3 mL IJ SOAJ injection Inject 0.3 mg into the muscle once. As needed for allergic reaction    [provider]  gabapentin (NEURONTIN) 300 MG capsule Take 300 mg by mouth 3 (three) times daily as needed (pain).  06/03/16   [provider]  ipratropium-albuterol (DUONEB) 0.5-2.5 (3) MG/3ML SOLN USE 1 SOLUTION IN NEBULIZER 4 TIMES DAILY AS NEEDED 11/20/17   [provider]  levalbuterol (XOPENEX HFA) 45 MCG/ACT inhaler Inhale 2 puffs into the lungs every 8 (eight) hours as needed for wheezing or shortness of breath.  12/17/16   [provider]  levalbuterol Penne Lash) 1.25 MG/3ML nebulizer solution Take 1.25 mg by nebulization every 8 (eight)  hours as needed for wheezing or shortness of breath.  06/06/17   [provider]  meloxicam (MOBIC) 7.5 MG tablet Take 1 tablet (7.5 mg total) by mouth daily. Patient not taking: Reported on 12/28/2019 08/03/19   Wurst, [place], PA-C  montelukast (SINGULAIR) 10 MG tablet Take 10 mg by mouth daily. Patient not taking: Reported on 12/28/2019 01/03/19   [provider]  nitroGLYCERIN (NITROSTAT) 0.4 MG SL tablet Place 1 tablet (0.4 mg total) under the tongue every 5 (five) minutes as needed for chest pain. 06/17/14   Jerline Pain, MD  NONFORMULARY OR COMPOUNDED ITEM Apply 1 application topically 4 (four) times daily. Katamine/Gabapentin/Baclofen/Lidocaine/Menthol Cream    [provider]  norgestimate-ethinyl estradiol (ORTHO-CYCLEN) 0.25-35 MG-MCG tablet Take 1 tablet by mouth daily.    [provider]  RESTASIS MULTIDOSE 0.05 % ophthalmic emulsion Place 1 drop into both eyes 2 (two) times daily.  08/09/17   [provider]  potassium chloride SA (K-DUR,KLOR-CON) 20 MEQ tablet Take by mouth.  08/03/19  [provider]  propranolol (INDERAL) 20 MG/5ML solution Take 2.5 mLs (10 mg total) by mouth 2 (two) times daily as needed. 12/06/16 08/03/19  Lendon Colonel, NP  TROKENDI XR 50 MG CP24 Take 1 capsule by mouth daily. 04/05/18 08/03/19  [provider]    Allergies    Adhesive [tape], Blue dyes (parenteral), Contrast media [iodinated diagnostic agents], Corn-containing products, Green dyes, Nickel, Onion, Other, Petroleum distillate, Silicone, Bee venom, Cortisporin [bacitra-neomycin-polymyxin-hc], and Yellow jacket venom  Review of Systems   Review of Systems  Eyes: Positive for pain.  All other systems reviewed and are negative.   Physical Exam Updated Vital Signs BP 123/88   Pulse 73   Temp 98.5 F (36.9 C) (Oral)   Resp 18   Ht 5\' 1"  (1.549 m)   Wt 53.1 kg   SpO2 97%   BMI 22.11 kg/m   Physical Exam Vitals and nursing  note reviewed.  Constitutional:      Appearance: She is well-developed and well-nourished.  HENT:     Head: Normocephalic.     Mouth/Throat:     Mouth: Mucous membranes are moist.  Eyes:     Extraocular Movements: Extraocular movements intact and EOM normal.     Pupils: Pupils are equal, round, and reactive to light.     Comments: No fluro uptake, no foreign body , left eyelid is swollen,  No obvious stye.   Pulmonary:     Effort: Pulmonary effort is normal.  Abdominal:     General: There is no distension.  Musculoskeletal:        General: Normal range of motion.     Cervical back: Normal range of motion.  Neurological:     General: No  focal deficit present.     Mental Status: She is alert and oriented to person, place, and time.  Psychiatric:        Mood and Affect: Mood and affect and mood normal.     ED Results / Procedures / Treatments   Labs (all labs ordered are listed, but only abnormal results are displayed) Labs Reviewed - No data to display  EKG None  Radiology No results found.  Procedures Procedures (including critical care time)  Medications Ordered in ED Medications - No data to display  ED Course  I have reviewed the triage vital signs and the nursing notes.  Pertinent labs & imaging results that were available during my care of the patient were reviewed by me and considered in my medical decision making (see chart for details).    MDM Rules/Calculators/A&P                          MDM:  Pt counseled on blepharitis.  Pt given tobrex drops.  Pt advised warm compresses. Return if any problems.  Final Clinical Impression(s) / ED Diagnoses Final diagnoses:  Swelling of eyelid, left    Rx / DC Orders ED Discharge Orders         Ordered    tobramycin (TOBREX) 0.3 % ophthalmic solution  Every 4 hours        03/16/20 1530        An After Visit Summary was printed and given to the patient.    Fransico Meadow, [place] 03/22/20 1556    Wyvonnia Dusky, MD 03/22/20 [id number]

## 2020-03-23 DIAGNOSIS — L989 Disorder of the skin and subcutaneous tissue, unspecified: Secondary | ICD-10-CM | POA: Diagnosis not present

## 2020-03-23 DIAGNOSIS — M4134 Thoracogenic scoliosis, thoracic region: Secondary | ICD-10-CM | POA: Diagnosis not present

## 2020-03-23 DIAGNOSIS — M5416 Radiculopathy, lumbar region: Secondary | ICD-10-CM | POA: Diagnosis not present

## 2020-03-23 DIAGNOSIS — G603 Idiopathic progressive neuropathy: Secondary | ICD-10-CM | POA: Diagnosis not present

## 2020-03-23 DIAGNOSIS — D497 Neoplasm of unspecified behavior of endocrine glands and other parts of nervous system: Secondary | ICD-10-CM | POA: Diagnosis not present

## 2020-03-23 DIAGNOSIS — G6289 Other specified polyneuropathies: Secondary | ICD-10-CM | POA: Diagnosis not present

## 2020-03-23 DIAGNOSIS — G4489 Other headache syndrome: Secondary | ICD-10-CM | POA: Diagnosis not present

## 2020-03-23 DIAGNOSIS — M545 Low back pain, unspecified: Secondary | ICD-10-CM | POA: Diagnosis not present

## 2020-03-23 DIAGNOSIS — F4001 Agoraphobia with panic disorder: Secondary | ICD-10-CM | POA: Diagnosis not present

## 2020-04-04 IMAGING — CT CT HEAD W/O CM
4 series · 16 of 47 positions shown, 18 images · non-contrast
Comparison: 05/03/2017 MRI of the head.

CLINICAL DATA: 26 y/o F; unexplained altered level of
consciousness.

EXAM:
CT HEAD WITHOUT CONTRAST
TECHNIQUE: Contiguous axial images were obtained from the base of the skull
through the vertex without intravenous contrast.

[Series 2: head trauma wo · axial · 0.39mm/px · z∈[+70,+160]mm · 7 of 26 slices shown, 9 images]
[im 4/26  brain]
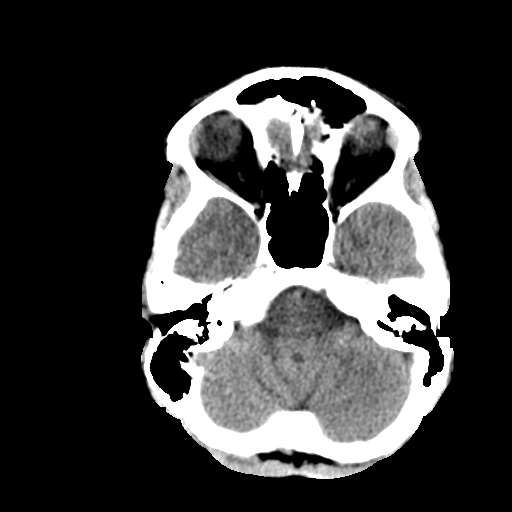
[im 4/26  bone]
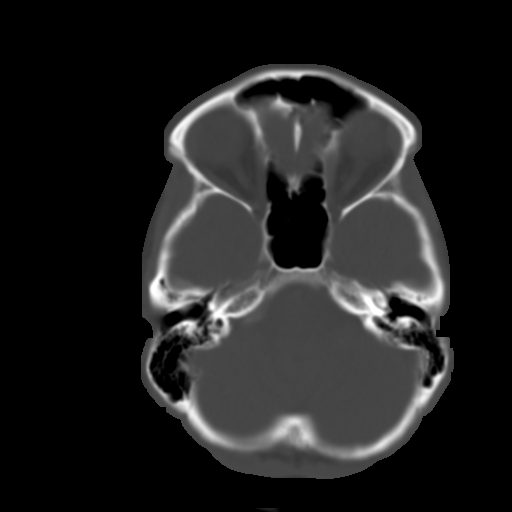
[im 7/26  brain]
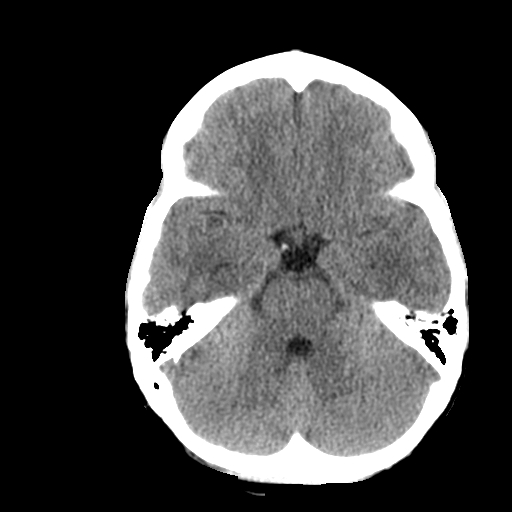
[im 10/26  brain]
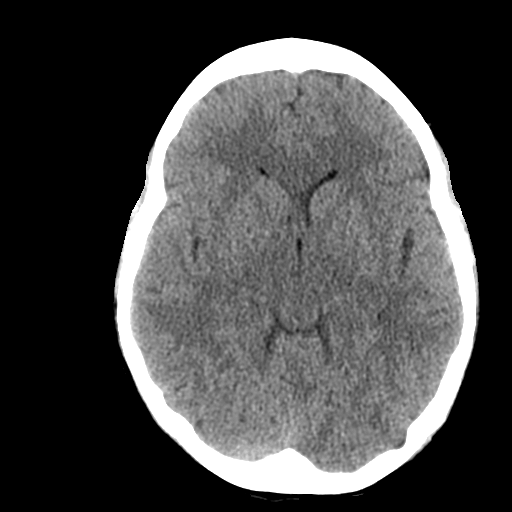
[im 13/26  brain]
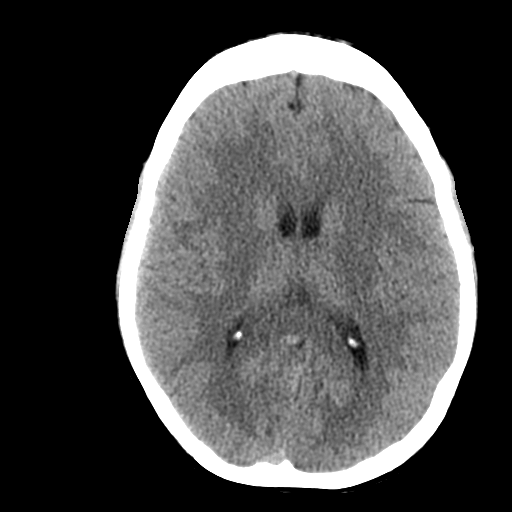
[im 16/26  brain]
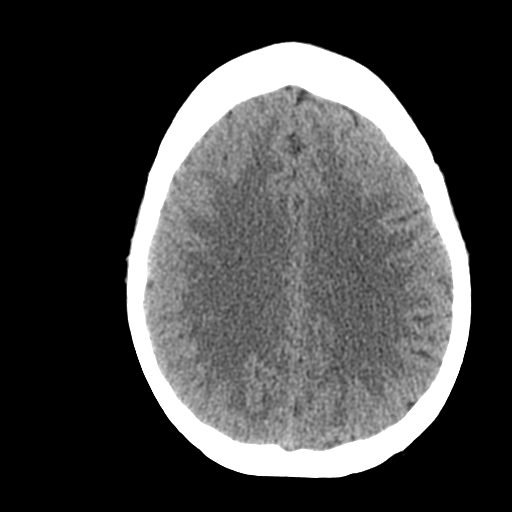
[im 16/26  bone]
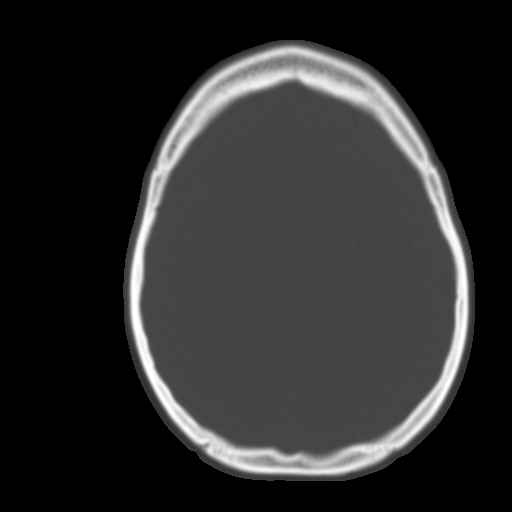
[im 19/26  brain]
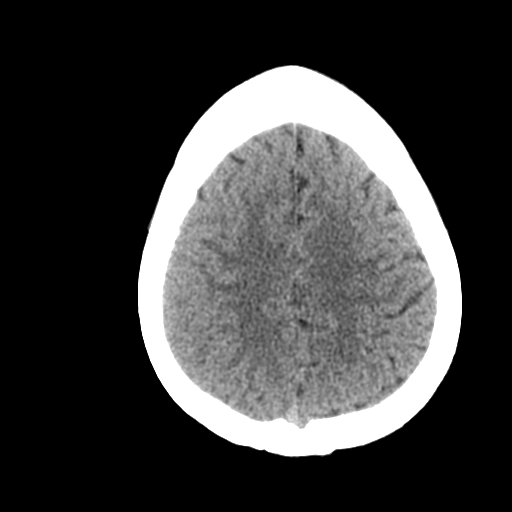
[im 22/26  brain]
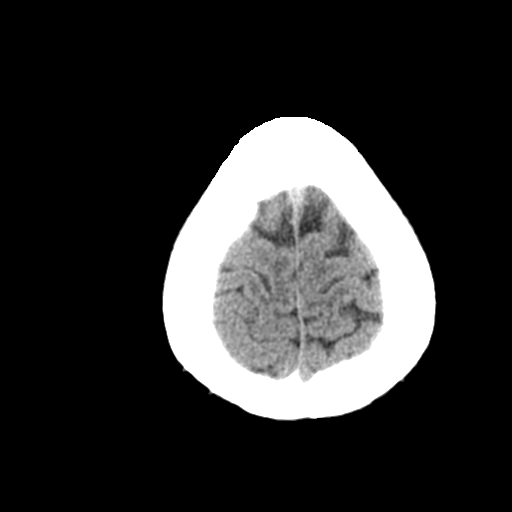

[Series 3: head bone · axial · 0.43mm/px · z∈[+67,+93]mm · 3 of 65 slices shown]
[im 7/65  bone]
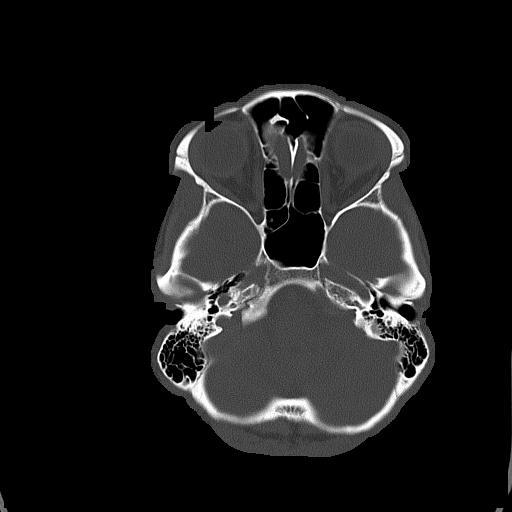
[im 13/65  bone]
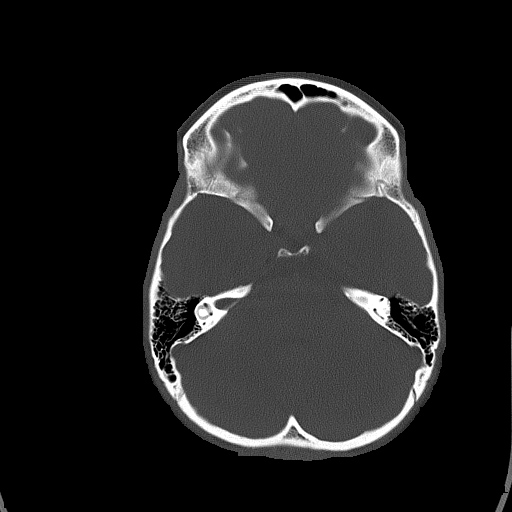
[im 20/65  bone]
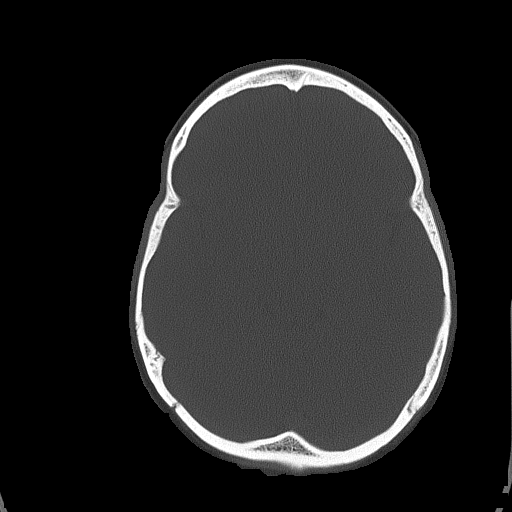

[Series 4: coronal soft tissue · coronal · 0.27mm/px · 3 of 62 slices shown]
[im 21/62  brain]
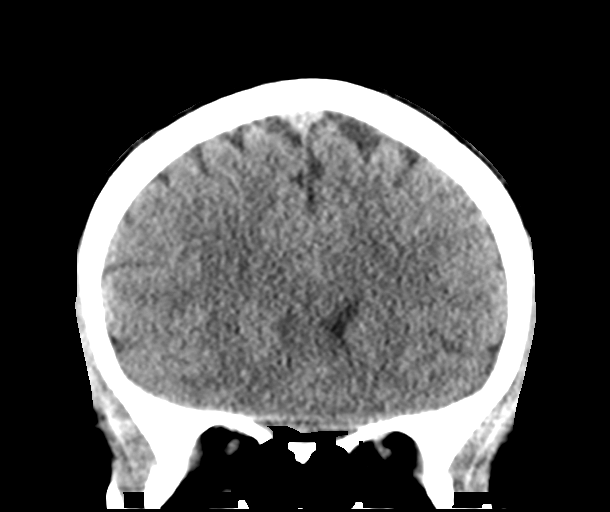
[im 28/62  brain]
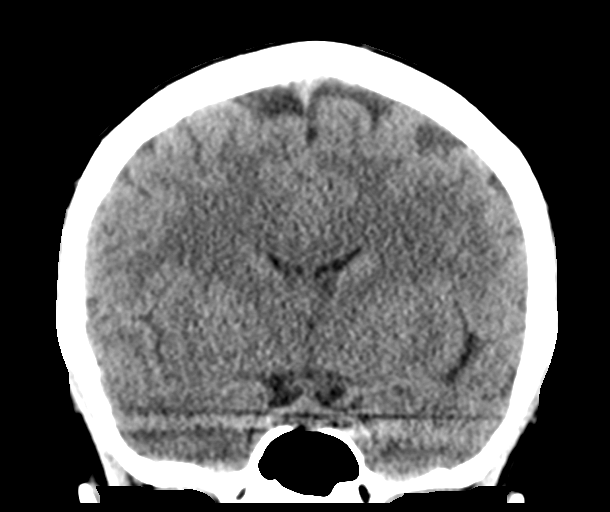
[im 34/62  brain]
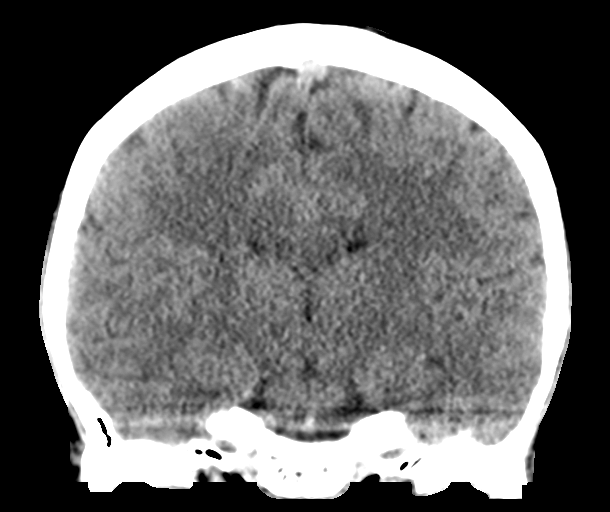

[Series 5: sagittal soft tissue · sagittal · 0.29mm/px · 3 of 51 slices shown]
[im 17/51  brain]
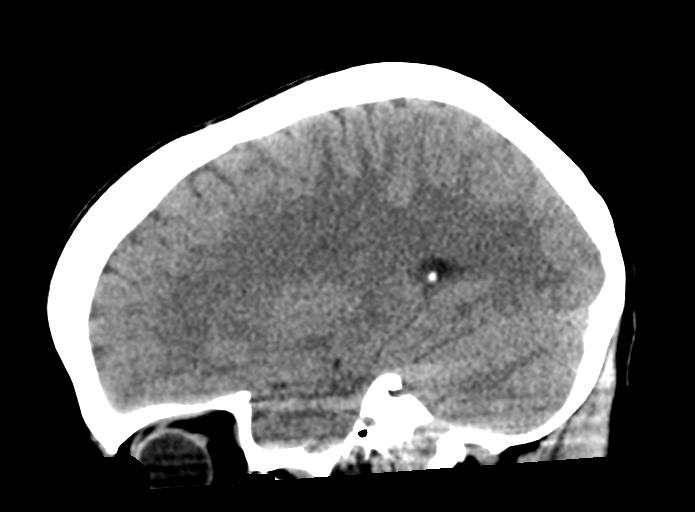
[im 26/51  brain]
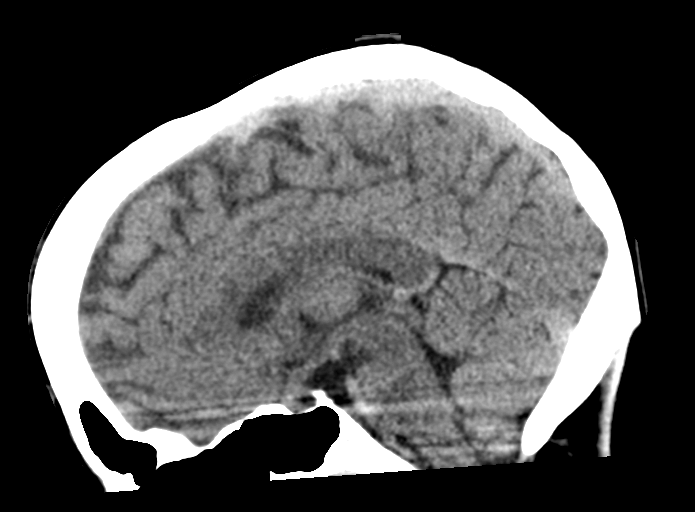
[im 34/51  brain]
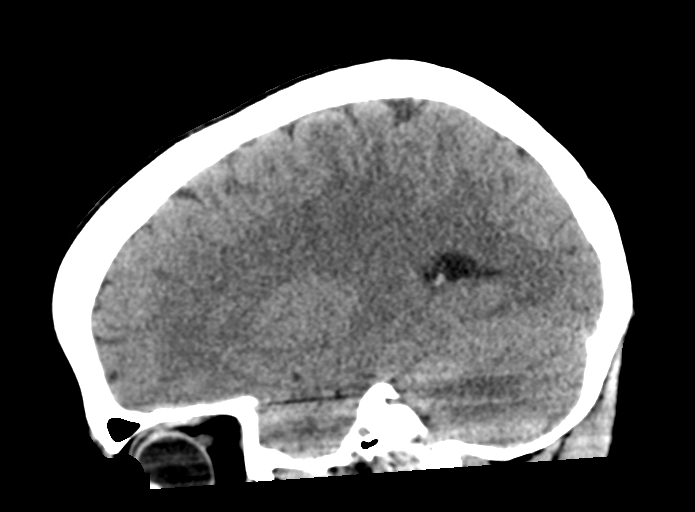

[16 of 47 positions shown; findings below may reference images not displayed]

FINDINGS: Brain: No evidence of acute infarction, hemorrhage, hydrocephalus,
extra-axial collection or mass lesion/mass effect. Stable prominent
pituitary gland measuring 8 mm craniocaudal with superior convex
margin, size within normal limits for age.

Vascular: No hyperdense vessel or unexpected calcification.

Skull: Normal. Negative for fracture or focal lesion.

Sinuses/Orbits: No acute finding.

Other: None.
IMPRESSION: No acute intracranial abnormality.

Stable prominent pituitary gland given differences in technique.

By: Andrey Gerardo Youmans M.D.

## 2020-04-12 DIAGNOSIS — G6289 Other specified polyneuropathies: Secondary | ICD-10-CM | POA: Diagnosis not present

## 2020-04-12 DIAGNOSIS — L989 Disorder of the skin and subcutaneous tissue, unspecified: Secondary | ICD-10-CM | POA: Diagnosis not present

## 2020-04-12 DIAGNOSIS — G603 Idiopathic progressive neuropathy: Secondary | ICD-10-CM | POA: Diagnosis not present

## 2020-04-12 DIAGNOSIS — M5416 Radiculopathy, lumbar region: Secondary | ICD-10-CM | POA: Diagnosis not present

## 2020-04-12 DIAGNOSIS — F4001 Agoraphobia with panic disorder: Secondary | ICD-10-CM | POA: Diagnosis not present

## 2020-04-12 DIAGNOSIS — M4134 Thoracogenic scoliosis, thoracic region: Secondary | ICD-10-CM | POA: Diagnosis not present

## 2020-04-12 DIAGNOSIS — M545 Low back pain, unspecified: Secondary | ICD-10-CM | POA: Diagnosis not present

## 2020-04-12 DIAGNOSIS — D497 Neoplasm of unspecified behavior of endocrine glands and other parts of nervous system: Secondary | ICD-10-CM | POA: Diagnosis not present

## 2020-04-12 DIAGNOSIS — G4489 Other headache syndrome: Secondary | ICD-10-CM | POA: Diagnosis not present

## 2020-04-13 DIAGNOSIS — G603 Idiopathic progressive neuropathy: Secondary | ICD-10-CM | POA: Diagnosis not present

## 2020-04-13 DIAGNOSIS — E559 Vitamin D deficiency, unspecified: Secondary | ICD-10-CM | POA: Diagnosis not present

## 2020-04-13 DIAGNOSIS — R636 Underweight: Secondary | ICD-10-CM | POA: Diagnosis not present

## 2020-04-13 DIAGNOSIS — N946 Dysmenorrhea, unspecified: Secondary | ICD-10-CM | POA: Diagnosis not present

## 2020-04-13 DIAGNOSIS — J4551 Severe persistent asthma with (acute) exacerbation: Secondary | ICD-10-CM | POA: Diagnosis not present

## 2020-04-13 DIAGNOSIS — G4489 Other headache syndrome: Secondary | ICD-10-CM | POA: Diagnosis not present

## 2020-04-13 DIAGNOSIS — F5089 Other specified eating disorder: Secondary | ICD-10-CM | POA: Diagnosis not present

## 2020-04-13 DIAGNOSIS — F4310 Post-traumatic stress disorder, unspecified: Secondary | ICD-10-CM | POA: Diagnosis not present

## 2020-04-13 DIAGNOSIS — I10 Essential (primary) hypertension: Secondary | ICD-10-CM | POA: Diagnosis not present

## 2020-04-13 DIAGNOSIS — F4001 Agoraphobia with panic disorder: Secondary | ICD-10-CM | POA: Diagnosis not present

## 2020-04-13 DIAGNOSIS — F39 Unspecified mood [affective] disorder: Secondary | ICD-10-CM | POA: Diagnosis not present

## 2020-04-13 DIAGNOSIS — M792 Neuralgia and neuritis, unspecified: Secondary | ICD-10-CM | POA: Diagnosis not present

## 2020-04-13 LAB — LIPID PANEL
Cholesterol: 193 (ref 0–200)
HDL: 55 (ref 35–70)
LDL Cholesterol: 124
Triglycerides: 75 (ref 40–160)

## 2020-04-13 LAB — BASIC METABOLIC PANEL
BUN: 13 (ref 4–21)
CO2: 24 — AB (ref 13–22)
Chloride: 99 (ref 99–108)
Creatinine: 0.7 (ref 0.5–1.1)
Glucose: 86
Potassium: 4 (ref 3.4–5.3)
Sodium: 139 (ref 137–147)

## 2020-04-13 LAB — HEPATIC FUNCTION PANEL
ALT: 14 (ref 7–35)
AST: 17 (ref 13–35)
Alkaline Phosphatase: 55 (ref 25–125)
Bilirubin, Total: 0.4

## 2020-04-13 LAB — COMPREHENSIVE METABOLIC PANEL
Albumin: 5.2 — AB (ref 3.5–5.0)
Calcium: 10.4 (ref 8.7–10.7)
GFR calc Af Amer: 132
Globulin: 114

## 2020-04-13 LAB — TSH: TSH: 1.67 (ref 0.41–5.90)

## 2020-05-16 ENCOUNTER — Telehealth: Payer: Self-pay

## 2020-05-16 ENCOUNTER — Other Ambulatory Visit: Payer: Self-pay | Admitting: "Endocrinology

## 2020-05-16 DIAGNOSIS — E559 Vitamin D deficiency, unspecified: Secondary | ICD-10-CM

## 2020-05-16 DIAGNOSIS — Z8639 Personal history of other endocrine, nutritional and metabolic disease: Secondary | ICD-10-CM

## 2020-05-16 NOTE — Telephone Encounter (Signed)
I saw her labs. She needs some additional : Prolactin, AM Cortisol, Vit D and may reschedule her visit as a follow up.

## 2020-05-16 NOTE — Telephone Encounter (Signed)
Dr Dorris Fetch- I received some labs from this patient's PCP and I called them to see if the patient was wanting to be schedule here because she was last here in 2019. They said they did not realized she had not come back to her appts-PCP will call patient-what do you need her to do for an appt if she calls back

## 2020-05-16 NOTE — Telephone Encounter (Signed)
Called this patient-Her mother answered , was very rude and disrespectful & stated labs should not have been sent here. She said she wanted the lab results-I did not release anything to her as I am not a clinical employee. I told her if she had an issue she needed to call her PCP. She disconnected the phone.

## 2020-07-24 DIAGNOSIS — E237 Disorder of pituitary gland, unspecified: Secondary | ICD-10-CM | POA: Diagnosis not present

## 2020-07-24 DIAGNOSIS — R238 Other skin changes: Secondary | ICD-10-CM | POA: Diagnosis not present

## 2020-07-24 DIAGNOSIS — J45909 Unspecified asthma, uncomplicated: Secondary | ICD-10-CM | POA: Diagnosis not present

## 2020-07-24 DIAGNOSIS — R519 Headache, unspecified: Secondary | ICD-10-CM | POA: Diagnosis not present

## 2020-08-14 DIAGNOSIS — E237 Disorder of pituitary gland, unspecified: Secondary | ICD-10-CM | POA: Diagnosis not present

## 2020-08-14 DIAGNOSIS — R5383 Other fatigue: Secondary | ICD-10-CM | POA: Diagnosis not present

## 2020-08-14 DIAGNOSIS — R238 Other skin changes: Secondary | ICD-10-CM | POA: Diagnosis not present

## 2020-08-14 DIAGNOSIS — R519 Headache, unspecified: Secondary | ICD-10-CM | POA: Diagnosis not present

## 2020-11-01 DIAGNOSIS — Z8639 Personal history of other endocrine, nutritional and metabolic disease: Secondary | ICD-10-CM | POA: Diagnosis not present

## 2020-11-01 DIAGNOSIS — I479 Paroxysmal tachycardia, unspecified: Secondary | ICD-10-CM | POA: Diagnosis not present

## 2020-11-02 ENCOUNTER — Telehealth: Payer: Self-pay | Admitting: Internal Medicine

## 2020-11-02 NOTE — Telephone Encounter (Signed)
Patient is a former Dr. Jacinta Shoe patient. She was referred to Endocrinologist Dr. Francetta Found at [facility]. He wants patient to see Cardiologist to rule out if the patient has Autonomic Neuropathy-Dysautonomia. She does not want to travel to [facility], wants Dr. Margaretann Loveless to release her to a Cardiologist in [place] or [place]. She wants appointment ASAP. She can be reached at [phone].

## 2020-11-03 NOTE — Telephone Encounter (Signed)
  At this time we cannot transfer to [place]/. Patient would like to switch to Dr Gasper Sells since she is familiar with the [facility] location. Dr. Margaretann Loveless has already approved a switch to another provider.

## 2020-11-09 ENCOUNTER — Encounter: Payer: Self-pay | Admitting: Internal Medicine

## 2020-11-09 ENCOUNTER — Other Ambulatory Visit: Payer: Self-pay

## 2020-11-09 ENCOUNTER — Ambulatory Visit (INDEPENDENT_AMBULATORY_CARE_PROVIDER_SITE_OTHER): Payer: Medicare Other | Admitting: Internal Medicine

## 2020-11-09 VITALS — BP 130/90 | HR 75 | Ht 61.0 in | Wt 112.2 lb

## 2020-11-09 DIAGNOSIS — R55 Syncope and collapse: Secondary | ICD-10-CM

## 2020-11-09 DIAGNOSIS — R072 Precordial pain: Secondary | ICD-10-CM | POA: Diagnosis not present

## 2020-11-09 LAB — CBC
Hematocrit: 41.3 % (ref 34.0–46.6)
Hemoglobin: 14.2 g/dL (ref 11.1–15.9)
MCH: 30.9 pg (ref 26.6–33.0)
MCHC: 34.4 g/dL (ref 31.5–35.7)
MCV: 90 fL (ref 79–97)
Platelets: 242 10*3/uL (ref 150–450)
RBC: 4.6 x10E6/uL (ref 3.77–5.28)
RDW: 11 % — ABNORMAL LOW (ref 11.7–15.4)
WBC: 8.5 10*3/uL (ref 3.4–10.8)

## 2020-11-09 LAB — BASIC METABOLIC PANEL
BUN/Creatinine Ratio: 23 (ref 9–23)
BUN: 15 mg/dL (ref 6–20)
CO2: 25 mmol/L (ref 20–29)
Calcium: 10.2 mg/dL (ref 8.7–10.2)
Chloride: 100 mmol/L (ref 96–106)
Creatinine, Ser: 0.66 mg/dL (ref 0.57–1.00)
Glucose: 97 mg/dL (ref 65–99)
Potassium: 4.2 mmol/L (ref 3.5–5.2)
Sodium: 140 mmol/L (ref 134–144)
eGFR: 122 mL/min/{1.73_m2} (ref >59)

## 2020-11-09 NOTE — Addendum Note (Signed)
Addended by: Precious Gilding on: 11/09/2020 02:08 PM   Modules accepted: Orders

## 2020-11-09 NOTE — Patient Instructions (Addendum)
Medication Instructions:  Your physician recommends that you continue on your current medications as directed. Please refer to the Current Medication list given to you today.  *If you need a refill on your cardiac medications before your next appointment, please call your pharmacy*   Lab Work: TODAY: CBC, BMET If you have labs (blood work) drawn today and your tests are completely normal, you will receive your results only by: [place] (if you have MyChart) OR A paper copy in the mail If you have any lab test that is abnormal or we need to change your treatment, we will call you to review the results.   Testing/Procedures: Your physician has requested that you have a Cardiac Stress MRI   Your physician has placed a referral to address dysautonomia    Follow-Up: At [facility], you and your health needs are our priority.  As part of our continuing mission to provide you with exceptional heart care, we have created designated Provider Care Teams.  These Care Teams include your primary Cardiologist (physician) and Advanced Practice Providers (APPs -  Physician Assistants and Nurse Practitioners) who all work together to provide you with the care you need, when you need it.  We recommend signing up for the patient portal called "MyChart".  Sign up information is provided on this After Visit Summary.  MyChart is used to connect with patients for Virtual Visits (Telemedicine).  Patients are able to view lab/test results, encounter notes, upcoming appointments, etc.  Non-urgent messages can be sent to your provider as well.   To learn more about what you can do with MyChart, go to [email or web address].    Your next appointment:   3 -52months)  The format for your next appointment:   In Person  Provider:   You may see MRudean Haskell MD or one of the following Advanced Practice Providers on your designated Care Team:   DMelina Copa PA-C MErmalinda Barrios PA-C   Other  Instructions   You are scheduled for Cardiac MRI on ____to be determined_. [facility]    (30-45 minutes prior to test start time). ?  [facility]y Brenda [phone] Please take advantage of the free valet parking available at the MAIN entrance (A entrance). [facility] to [facility] ([facility] Floor). ? Magnetic resonance imaging (MRI) is a painless test that produces images of the inside of the body without using Xrays.  During an MRI, strong magnets and radio waves work together in a mResearch officer, political partyto form detailed images.   MRI images may provide more details about a medical condition than X-rays, CT scans, and ultrasounds can provide.  You may be given earphones to listen for instructions.  You may eat a light breakfast and take medications as ordered with the exception of HCTZ (fluid pill, other). Please avoid stimulants for 12 hr prior to test. (Ie. Caffeine, nicotine, chocolate, or antihistamine medications)  If a contrast material will be used, an IV will be inserted into one of your veins. Contrast material will be injected into your IV. It will leave your body through your urine within a day. You may be told to drink plenty of fluids to help flush the contrast material out of your system.  You will be asked to remove all metal, including: Watch, jewelry, and other metal objects including hearing aids, hair pieces and dentures. Also wearable glucose monitoring  systems (ie. Freestyle Libre and Omnipods) (Braces and fillings normally are not a problem.)   TEST WILL TAKE APPROXIMATELY 1 HOUR  PLEASE NOTIFY SCHEDULING AT LEAST 24 HOURS IN ADVANCE IF YOU ARE UNABLE TO KEEP YOUR APPOINTMENT. [phone]  Please call Marchia Bond, cardiac imaging nurse navigator with any questions/concerns. Marchia Bond RN Navigator Cardiac Imaging Gordy Clement RN  Navigator Cardiac Imaging [facility] [phone] Office

## 2020-11-09 NOTE — Progress Notes (Signed)
Cardiology Office Note:    Date:  11/09/2020   ID:  Traci Hensley, DOB 1990-10-09, MRN [id number]  PCP:  Martin Majestic, [place] Providers Cardiologist:  Werner Lean, MD     Referring MD: Martin Majestic, *   CC: Transition to different general cardiologist (prior Shaune Pascal)  History of Present Illness:    Traci Hensley is a 30 y.o. female with a hx of PTSD with query of inappropriate sinus tachycardia vs POTS scoliosis who presents 11/09/20.  Called urgent at the initial assessment: during the start of orthostatic vital signs (130/90, Heart rate 75).  Patient developed 10/10 chest pain and tightness.  Fell into the arms of one of our team members.  On initially assessment only responsive to painful stimuli. BG 88.  With 1-2 minutes lethargy improved.  BP 130/90.  Heart rate 70s.    Discussed with patient and family recommendations for ED assessment.  They note that this happens frequently and defer ED and EMS evaluation.  Patient notes that she is feeling terrible. Notes that she has spontaneous chest tightness and pain that happen spontaneously.  These are getting worse.  Notes that she has prior MI at a hospital NOS but is unable to access these records.    Discomfort improves spontaneously as well.  Patient exertion notable for being able to do exercises and feels tachycardia.  No shortness of breath, DOE.  No PND or orthopnea.  No weight gain, leg swelling , or abdominal swelling.   Is loosing balance more frequently.  Have frequent fast and slow heart rates.  Has had multiple heart monitors and has allergies to the adhesive.  Notes having issues with scoliosis making echocardiography difficult.  Notes lethargic reaction to receiving radiation.  Notes hemoptysis associated with IV contrast.  Notes corn allergy thought has tolerated nitroglycerin in the past. Had pituitary evaluation and 11/01/20.   Concern for POTS.   Past Medical History:  Diagnosis Date   Agoraphobia    Anxiety    Bipolar 1 disorder (HCC)    Chronic abdominal pain    Chronic chest pain    Depression    PTSD (post-traumatic stress disorder)    Scoliosis     Past Surgical History:  Procedure Laterality Date   MOUTH SURGERY      Current Medications: Current Meds  Medication Sig   ADVAIR DISKUS 500-50 MCG/DOSE AEPB Inhale 1 puff into the lungs 2 (two) times daily as needed (for shortness of breath).    albuterol (PROVENTIL HFA;VENTOLIN HFA) 108 (90 Base) MCG/ACT inhaler Inhale into the lungs every 6 (six) hours as needed for wheezing or shortness of breath.   baclofen (LIORESAL) 20 MG tablet Take 20 mg by mouth as needed.   cyclobenzaprine (FLEXERIL) 10 MG tablet Take 10 mg by mouth as needed.   diclofenac Sodium (VOLTAREN) 1 % GEL Apply 2 g topically as needed.   EPINEPHrine 0.3 mg/0.3 mL IJ SOAJ injection Inject 0.3 mg into the muscle once. As needed for allergic reaction   ergocalciferol (VITAMIN D2) 1.25 MG (50000 UT) capsule once a week.   gabapentin (NEURONTIN) 300 MG capsule Take by mouth 3 (three) times daily.   ipratropium-albuterol (DUONEB) 0.5-2.5 (3) MG/3ML SOLN USE 1 SOLUTION IN NEBULIZER 4 TIMES DAILY AS NEEDED   levalbuterol (XOPENEX HFA) 45 MCG/ACT inhaler Inhale 2 puffs into the lungs every 8 (eight) hours as needed for wheezing or shortness of breath.  levalbuterol (XOPENEX) 1.25 MG/3ML nebulizer solution Take 1.25 mg by nebulization every 8 (eight) hours as needed for wheezing or shortness of breath.    nitroGLYCERIN (NITROSTAT) 0.4 MG SL tablet Place 1 tablet (0.4 mg total) under the tongue every 5 (five) minutes as needed for chest pain.   NONFORMULARY OR COMPOUNDED ITEM Apply 1 application topically 4 (four) times daily. Katamine/Gabapentin/Baclofen/Lidocaine/Menthol Cream   norgestimate-ethinyl estradiol (ORTHO-CYCLEN) 0.25-35 MG-MCG tablet Take 1 tablet by mouth daily.   RESTASIS  MULTIDOSE 0.05 % ophthalmic emulsion Place 1 drop into both eyes 2 (two) times daily.    SSD 1 % cream as needed.     Allergies:   Adhesive [tape], Blue dyes (parenteral), Contrast media [iodinated diagnostic agents], Corn-containing products, Green dyes, Nickel, Onion, Other, Petroleum distillate, Silicone, Bee venom, Cortisporin [bacitra-neomycin-polymyxin-hc], and Yellow jacket venom   Social History   Socioeconomic History   Marital status: Single    Spouse name: Not on file   Number of children: Not on file   Years of education: Not on file   Highest education level: Not on file  Occupational History   Not on file  Tobacco Use   Smoking status: Some Days    Packs/day: 0.01    Years: 2.00    Pack years: 0.02    Types: Cigarettes    Last attempt to quit: 02/14/2014    Years since quitting: 6.7   Smokeless tobacco: Never   Tobacco comments:    rare, social smoker  Vaping Use   Vaping Use: Some days  Substance and Sexual Activity   Alcohol use: Yes    Alcohol/week: 0.0 standard drinks    Comment: occ   Drug use: No   Sexual activity: Not on file  Other Topics Concern   Not on file  Social History Narrative   Not on file   Social Determinants of Health   Financial Resource Strain: Not on file  Food Insecurity: Not on file  Transportation Needs: Not on file  Physical Activity: Not on file  Stress: Not on file  Social Connections: Not on file     Family History: The patient's family history includes Arrhythmia in her mother. There is no history of Colon cancer.  ROS:   Please see the history of present illness.     All other systems reviewed and are negative.  EKGs/Labs/Other Studies Reviewed:    The following studies were reviewed today:  EKG:  EKG is ordered today.  The ekg ordered today demonstrates  11/09/20: SR 75   Cardiac Event Monitoring: Date: 01/25/2020 Results: Minimum HR (bpm): 52 Maximum HR (bpm): 164 14% burden of tachycardia    Supraventricular Ectopy: <1% SVT: none   Ventricular Ectopy: <1% NSVT: none Ventricular Tachycardia: none   Pauses: none AV block: none   Atrial fibrillation: none   Diary events: Diary events of dizziness, lightheadedness, chest pain and pressure, SOB, rapid or fast heartbeat, tired or fatigued associated primarily with sinus tachycardia, and sinus rhythm.    IMPRESSION: Symptomatic sinus tachycardia.   Transthoracic Echocardiogram: Date: 12/11/2016 Results: Study Conclusions   - Left ventricle: The cavity size was normal. Wall thickness was    normal. Systolic function was normal. The estimated ejection    fraction was in the range of 60% to 65%. Wall motion was normal;    there were no regional wall motion abnormalities. Left    ventricular diastolic function parameters were normal.     Recent Labs: 01/26/2020: Hemoglobin 13.4; Platelets 325  04/13/2020: ALT 14; BUN 13; Creatinine 0.7; Potassium 4.0; Sodium 139; TSH 1.67  Recent Lipid Panel    Component Value Date/Time   CHOL 193 04/13/2020 0000   TRIG 75 04/13/2020 0000   HDL 55 04/13/2020 0000   LDLCALC 124 04/13/2020 0000       Physical Exam:    VS:  BP 130/90   Pulse 75   Ht '5\' 1"'$  (1.549 m)   Wt 112 lb 3.2 oz (50.9 kg)   BMI 21.20 kg/m     Wt Readings from Last 3 Encounters:  11/09/20 112 lb 3.2 oz (50.9 kg)  03/16/20 117 lb (53.1 kg)  01/26/20 125 lb (56.7 kg)    Orthostatics aborted prior to supine due to event as above  GEN: Thin well developed in no acute distress HEENT: Normal NECK: No JVD LYMPHATICS: No lymphadenopathy CARDIAC: RRR, no murmurs, rubs, gallops RESPIRATORY:  Clear to auscultation without rales, wheezing or rhonchi  ABDOMEN: Soft, non-tender, non-distended MUSCULOSKELETAL:  No edema; No deformity  SKIN: Warm and dry; has well healed horizontal forearm scars most prominent on left arm NEUROLOGIC:  Alert and oriented x 3 PSYCHIATRIC:  Normal affect   ASSESSMENT:    1.  Precordial pain   2. Syncope, unspecified syncope type    PLAN:    Precordial chest pain Syncope Concern for POTS - has not tolerated any heart monitors and deferred loop and EP eval - we have discussed Stress CMR for evaluation; ideally when I'm in patient and can assist - has not tolerated propranolol in the past (concern for hypotension) - ivabradine could be used - ultimately may have POTS and may need to see a dysautonomic specialist:  We are willing to refer to [facility], or [facility] if patient and family are amenable. - We have discussed exercise for POTS: [email or web address]-[email or web address]  Three months follow up post   Time Spent Directly with Patient:   I have spent a total of 60 minutes with the patient reviewing notes, imaging, EKGs, labs and examining the patient as well as establishing an assessment and plan that was discussed personally with the patient.  > 50% of time was spent in direct patient care and family.   Shared Decision Making/Informed Consent The risks [chest pain, shortness of breath, cardiac arrhythmias, dizziness, blood pressure fluctuations, myocardial infarction, stroke/transient ischemic attack, and life-threatening complications (estimated to be 1 in 10,000)], benefits (risk stratification, diagnosing coronary artery disease, treatment guidance) and alternatives of a stress or dobutamine stress echocardiogram were discussed in detail with Ms. Wojdyla and she agrees to proceed.    Medication Adjustments/Labs and Tests Ordered: Current medicines are reviewed at length with the patient today.  Concerns regarding medicines are outlined above.  Orders Placed This Encounter  Procedures   MR CARDIAC STRESS TEST   CBC   Basic metabolic panel   EKG [id number]   No orders of the defined types were placed in this encounter.   Patient Instructions  Medication Instructions:   Your physician recommends that you continue on your current medications as directed. Please refer to the Current Medication list given to you today.  *If you need a refill on your cardiac medications before your next appointment, please call your pharmacy*   Lab Work: TODAY: CBC, BMET If you have labs (blood work) drawn today and your tests are completely normal, you will receive your results only by: [place] (if you have MyChart) OR A paper copy in the mail If you have  any lab test that is abnormal or we need to change your treatment, we will call you to review the results.   Testing/Procedures: Your physician has requested that you have a Cardiac Stress MRI   Your physician has placed a referral to address dysautonomia    Follow-Up: At [facility], you and your health needs are our priority.  As part of our continuing mission to provide you with exceptional heart care, we have created designated Provider Care Teams.  These Care Teams include your primary Cardiologist (physician) and Advanced Practice Providers (APPs -  Physician Assistants and Nurse Practitioners) who all work together to provide you with the care you need, when you need it.  We recommend signing up for the patient portal called "MyChart".  Sign up information is provided on this After Visit Summary.  MyChart is used to connect with patients for Virtual Visits (Telemedicine).  Patients are able to view lab/test results, encounter notes, upcoming appointments, etc.  Non-urgent messages can be sent to your provider as well.   To learn more about what you can do with MyChart, go to [email or web address].    Your next appointment:   3 -69months)  The format for your next appointment:   In Person  Provider:   You may see MRudean Haskell MD or one of the following Advanced Practice Providers on your designated Care Team:   DMelina Copa PA-C MErmalinda Barrios PA-C   Other Instructions   You are  scheduled for Cardiac MRI on ____to be determined_. [facility]    (30-45 minutes prior to test start time). ?  [facility]a Rosita Sheldahl [phone] Please take advantage of the free valet parking available at the MAIN entrance (A entrance). Proceed to the [facility] (First Floor). ? Magnetic resonance imaging (MRI) is a painless test that produces images of the inside of the body without using Xrays.  During an MRI, strong magnets and radio waves work together in a mResearch officer, political partyto form detailed images.   MRI images may provide more details about a medical condition than X-rays, CT scans, and ultrasounds can provide.  You may be given earphones to listen for instructions.  You may eat a light breakfast and take medications as ordered with the exception of HCTZ (fluid pill, other). Please avoid stimulants for 12 hr prior to test. (Ie. Caffeine, nicotine, chocolate, or antihistamine medications)  If a contrast material will be used, an IV will be inserted into one of your veins. Contrast material will be injected into your IV. It will leave your body through your urine within a day. You may be told to drink plenty of fluids to help flush the contrast material out of your system.  You will be asked to remove all metal, including: Watch, jewelry, and other metal objects including hearing aids, hair pieces and dentures. Also wearable glucose monitoring systems (ie. Freestyle Libre and Omnipods) (Braces and fillings normally are not a problem.)   TEST WILL TAKE APPROXIMATELY 1 HOUR  PLEASE NOTIFY SCHEDULING AT LEAST 24 HOURS IN ADVANCE IF YOU ARE UNABLE TO KEEP YOUR APPOINTMENT. [phone] Please call SMarchia Bond cardiac imaging nurse navigator with any questions/concerns. SMarchia BondRN Navigator Cardiac Imaging MGordy ClementRN Navigator Cardiac  Imaging [facility] [phone]      Signed, MWerner Lean MD  11/09/2020 1:09 PM  [facility]

## 2020-11-10 NOTE — Addendum Note (Signed)
Addended by: Precious Gilding on: 11/10/2020 08:59 AM   Modules accepted: Orders

## 2020-11-14 DIAGNOSIS — R6889 Other general symptoms and signs: Secondary | ICD-10-CM | POA: Diagnosis not present

## 2020-11-14 DIAGNOSIS — R5383 Other fatigue: Secondary | ICD-10-CM | POA: Diagnosis not present

## 2020-11-14 DIAGNOSIS — G909 Disorder of the autonomic nervous system, unspecified: Secondary | ICD-10-CM | POA: Diagnosis not present

## 2020-11-14 DIAGNOSIS — Z6821 Body mass index (BMI) 21.0-21.9, adult: Secondary | ICD-10-CM | POA: Diagnosis not present

## 2020-11-14 DIAGNOSIS — H6501 Acute serous otitis media, right ear: Secondary | ICD-10-CM | POA: Diagnosis not present

## 2020-11-20 ENCOUNTER — Telehealth: Payer: Self-pay | Admitting: Internal Medicine

## 2020-11-20 DIAGNOSIS — R55 Syncope and collapse: Secondary | ICD-10-CM

## 2020-11-20 NOTE — Telephone Encounter (Signed)
Order placed for [facility] Neurology referral for POTS.  Per request.  Spoke with mother ok per dpr.  I informed her that referral has been sent and someone will coordinate care at a later time.  She thanked me for returning her call.

## 2020-11-20 NOTE — Telephone Encounter (Signed)
   Pt's mother calling, she said they received a call from [facility] clinic in [place] to schedule an appt for th pts. She said they thought Dr. Gasper Sells will [facility] the referral to [facility] so it will be closer to them

## 2020-12-18 DIAGNOSIS — G4489 Other headache syndrome: Secondary | ICD-10-CM | POA: Diagnosis not present

## 2020-12-18 DIAGNOSIS — F4001 Agoraphobia with panic disorder: Secondary | ICD-10-CM | POA: Diagnosis not present

## 2020-12-18 DIAGNOSIS — M545 Low back pain, unspecified: Secondary | ICD-10-CM | POA: Diagnosis not present

## 2020-12-18 DIAGNOSIS — G603 Idiopathic progressive neuropathy: Secondary | ICD-10-CM | POA: Diagnosis not present

## 2020-12-18 DIAGNOSIS — L989 Disorder of the skin and subcutaneous tissue, unspecified: Secondary | ICD-10-CM | POA: Diagnosis not present

## 2020-12-18 DIAGNOSIS — M5416 Radiculopathy, lumbar region: Secondary | ICD-10-CM | POA: Diagnosis not present

## 2020-12-18 DIAGNOSIS — M4134 Thoracogenic scoliosis, thoracic region: Secondary | ICD-10-CM | POA: Diagnosis not present

## 2020-12-18 DIAGNOSIS — D497 Neoplasm of unspecified behavior of endocrine glands and other parts of nervous system: Secondary | ICD-10-CM | POA: Diagnosis not present

## 2020-12-18 DIAGNOSIS — I951 Orthostatic hypotension: Secondary | ICD-10-CM | POA: Diagnosis not present

## 2020-12-18 DIAGNOSIS — G6289 Other specified polyneuropathies: Secondary | ICD-10-CM | POA: Diagnosis not present

## 2020-12-25 ENCOUNTER — Other Ambulatory Visit: Payer: Self-pay

## 2020-12-25 ENCOUNTER — Telehealth: Payer: Self-pay | Admitting: Internal Medicine

## 2020-12-25 DIAGNOSIS — R55 Syncope and collapse: Secondary | ICD-10-CM

## 2020-12-25 DIAGNOSIS — R002 Palpitations: Secondary | ICD-10-CM

## 2020-12-25 DIAGNOSIS — R000 Tachycardia, unspecified: Secondary | ICD-10-CM

## 2020-12-25 NOTE — Telephone Encounter (Signed)
  Traci Hensley pts power of attorney is requesting that referral be sent to [employer] rather than [employer] or [place]... phone number is  [phone]- [phone]  [facility] [address], [place] [address], not sure what fax number is.  Please Advise.

## 2020-12-25 NOTE — Telephone Encounter (Signed)
Pt POA Marijo returned call in regards to referral.  She reports that [place] has one of the best autonomic clinics in the country.  I advised her that referral has been placed.  Pt had a question in regards to Cardiac MRI.  She says that she could not do stress test d/t inability to ambulate as needed.  She wants to know how Cardiac MRI will show what's needed if adenosine that she will be given will slow down heart rate.  I advised her that I would route to MD to address at his earliest convenience.   They thanked me for returning call.

## 2020-12-25 NOTE — Telephone Encounter (Signed)
Called Traci Hensley ok per dpr.  To inquire if pt would like to see a specific MD.  Phone continued to ring with no answer.  I called [facility] and was told a referral needs to be faxed to [phone].  They will send over a 14-15 pg packet that needs to be filled out and faxed back. Referral placed.

## 2020-12-27 ENCOUNTER — Other Ambulatory Visit (HOSPITAL_COMMUNITY): Payer: Self-pay | Admitting: *Deleted

## 2020-12-27 ENCOUNTER — Telehealth: Payer: Self-pay | Admitting: Internal Medicine

## 2020-12-27 DIAGNOSIS — R072 Precordial pain: Secondary | ICD-10-CM

## 2020-12-27 DIAGNOSIS — R000 Tachycardia, unspecified: Secondary | ICD-10-CM

## 2020-12-27 DIAGNOSIS — R55 Syncope and collapse: Secondary | ICD-10-CM

## 2020-12-27 DIAGNOSIS — R002 Palpitations: Secondary | ICD-10-CM

## 2020-12-27 NOTE — Telephone Encounter (Signed)
Called Patient and guardian  in regard to CMR Discussed  - MOA of adenosine and regadenosn as a stress agent, compared to nitro for CCTA - prior history of MI for which her records are sealed - history of descending/abdominal coarctation of the aorta  Answered questions in regard to  Limitation for CMR for arrythmia diagnosis without evidence of scar  Assessment - will see if we can add on MRA Aorta for history of coarctation of the aorta; if not we will still purse CMR for chest pain, syncope, and vague history of MI   Patient had no further questions.  Rudean Haskell, MD [address], [address] [place], [place] [address] [phone]  9:29 AM   Time Spent Directly with Patient:   I have spent a total of 30 minutes with the patient reviewing notes, imaging, EKGs, labs and as well as establishing an assessment and plan that was discussed personally with the patient.  100% of time was spent in direct patient care and/or  family and reviewing imaging with patient and discussing the limitations of Cardiac MRI.

## 2020-12-27 NOTE — Progress Notes (Addendum)
Order placed for MR chest angio w/wo contrast per Dr. Gasper Sells order.  Called and spoke with POA she reports pt is not pregnant, there is zero chance that she could be pregnant. Pt takes birth control pills and has a cycle every 90 days.

## 2020-12-30 ENCOUNTER — Other Ambulatory Visit (HOSPITAL_COMMUNITY): Payer: Self-pay | Admitting: *Deleted

## 2020-12-30 DIAGNOSIS — Z01810 Encounter for preprocedural cardiovascular examination: Secondary | ICD-10-CM

## 2021-01-01 ENCOUNTER — Telehealth (HOSPITAL_COMMUNITY): Payer: Self-pay | Admitting: *Deleted

## 2021-01-01 ENCOUNTER — Inpatient Hospital Stay (HOSPITAL_COMMUNITY): Admission: RE | Admit: 2021-01-01 | Payer: Medicare Other | Source: Ambulatory Visit

## 2021-01-01 NOTE — Telephone Encounter (Signed)
Reaching out to patient to offer assistance regarding upcoming cardiac imaging study; pt verbalizes understanding of appt date/time, parking situation and where to check in, and verified current allergies; name and call back number provided for further questions should they arise  Gordy Clement RN Navigator Cardiac Imaging [facility] [phone] office [phone] cell  Patient to arrive at 11:15am for EKG.

## 2021-01-02 ENCOUNTER — Other Ambulatory Visit (HOSPITAL_COMMUNITY): Payer: Medicare Other

## 2021-01-02 ENCOUNTER — Telehealth (HOSPITAL_COMMUNITY): Payer: Self-pay | Admitting: *Deleted

## 2021-01-02 ENCOUNTER — Telehealth: Payer: Self-pay | Admitting: Internal Medicine

## 2021-01-02 ENCOUNTER — Ambulatory Visit (HOSPITAL_COMMUNITY): Admission: RE | Admit: 2021-01-02 | Payer: Medicare Other | Source: Ambulatory Visit
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
## 2021-01-02 NOTE — Telephone Encounter (Signed)
Due to the multiple allergies and concerns of the patient and her POA, multiple follow up phone calls made to discuss the process that would occur for patient's Cardiac Stress MRI.  Information regarding the substance of the EKG leads were given to the best of my abilities.  Samples of the leads were offered to patient to try out prior to test.  One concern of the patient was changing into hospital attire.  The patient's POA states that she is unable to change into hospital clothing due to extreme PTSD.  Patient was offered the opportunity to obtain MRI safe hospital gowns and pants to try on prior to arrival but this was not acceptable due to the PTSD. The patient and POA stated that they have been able to get MRIs in the past at other local imaging locations and would be willing to sign a waiver of release the hospital's liability if she would be allowed to wear her own clothing for the MRI. Per MRI supervisor, Risk Management would not allow a waiver to be signed. Patient's POA offered Radiology leadership contact information to discuss further. Patient's POA offered other times we could do the scan this week if things could be worked out for scan. Dr. Gasper Sells is aware of the events related to the delay in obtaining scan.

## 2021-01-23 DIAGNOSIS — I951 Orthostatic hypotension: Secondary | ICD-10-CM | POA: Diagnosis not present

## 2021-01-23 DIAGNOSIS — M5416 Radiculopathy, lumbar region: Secondary | ICD-10-CM | POA: Diagnosis not present

## 2021-01-23 DIAGNOSIS — G603 Idiopathic progressive neuropathy: Secondary | ICD-10-CM | POA: Diagnosis not present

## 2021-01-23 DIAGNOSIS — M4134 Thoracogenic scoliosis, thoracic region: Secondary | ICD-10-CM | POA: Diagnosis not present

## 2021-01-23 DIAGNOSIS — F4001 Agoraphobia with panic disorder: Secondary | ICD-10-CM | POA: Diagnosis not present

## 2021-01-23 DIAGNOSIS — D497 Neoplasm of unspecified behavior of endocrine glands and other parts of nervous system: Secondary | ICD-10-CM | POA: Diagnosis not present

## 2021-01-23 DIAGNOSIS — G4489 Other headache syndrome: Secondary | ICD-10-CM | POA: Diagnosis not present

## 2021-01-23 DIAGNOSIS — M545 Low back pain, unspecified: Secondary | ICD-10-CM | POA: Diagnosis not present

## 2021-01-23 DIAGNOSIS — L989 Disorder of the skin and subcutaneous tissue, unspecified: Secondary | ICD-10-CM | POA: Diagnosis not present

## 2021-01-23 DIAGNOSIS — G6289 Other specified polyneuropathies: Secondary | ICD-10-CM | POA: Diagnosis not present

## 2021-02-12 DIAGNOSIS — R6889 Other general symptoms and signs: Secondary | ICD-10-CM | POA: Diagnosis not present

## 2021-02-12 DIAGNOSIS — R5383 Other fatigue: Secondary | ICD-10-CM | POA: Diagnosis not present

## 2021-02-12 DIAGNOSIS — H66002 Acute suppurative otitis media without spontaneous rupture of ear drum, left ear: Secondary | ICD-10-CM | POA: Diagnosis not present

## 2021-02-12 DIAGNOSIS — Z6821 Body mass index (BMI) 21.0-21.9, adult: Secondary | ICD-10-CM | POA: Diagnosis not present

## 2021-02-12 DIAGNOSIS — R110 Nausea: Secondary | ICD-10-CM | POA: Diagnosis not present

## 2021-02-12 DIAGNOSIS — R55 Syncope and collapse: Secondary | ICD-10-CM | POA: Diagnosis not present

## 2021-02-12 DIAGNOSIS — G901 Familial dysautonomia [Riley-Day]: Secondary | ICD-10-CM | POA: Diagnosis not present

## 2021-02-19 NOTE — Progress Notes (Signed)
Cardiology Office Note:    Date:  02/20/2021   ID:  Traci Hensley, DOB Oct 27, 1990, MRN [id number]  PCP:  Celene Squibb, MD   [facility] Providers Cardiologist:  Werner Lean, MD     Referring MD: Martin Majestic, *   CC: Follow up CP Has had prior cardiology (prior Meriel Flavors, [facility]ron Doom)  History of Present Illness:    Traci Hensley is a 30 y.o. female with a hx of PTSD with query of inappropriate sinus tachycardia vs POTS scoliosis who presents 11/09/20.  In interim of this visit, we were unable to perform Stress MRI at our facility (see phone notes for more details).  Seen 02/20/21.  Patient notes that she is doing worse.  Since she has been forced to do orthostatic vitals she has had worsening in fast heart, slow heart beats, high blood pressure, and chest pain.  Her missed beats are getting worse.    We again reviewed her prior history of heart attack and her limitations to ionizing radiation (makes her feel loopy) and to IV contrast.  She notes that she has allergies that preclude an ILR.  We talked about gel pads and that she has tolerated hydrogel pads in the past but did not tolerate the [employer] Monitor.  We discussed EP once again and she is hesistant to see EP or [place] for Dysautonomia.  She has worsening sx after seeing her neurologist and had q48 hours fludrocortisone.   Past Medical History:  Diagnosis Date   Agoraphobia    Anxiety    Bipolar 1 disorder (HCC)    Chronic abdominal pain    Chronic chest pain    Depression    PTSD (post-traumatic stress disorder)    Scoliosis     Past Surgical History:  Procedure Laterality Date   MOUTH SURGERY      Current Medications: Current Meds  Medication Sig   ADVAIR DISKUS 500-50 MCG/DOSE AEPB Inhale 1 puff into the lungs 2 (two) times daily as needed (for shortness of breath).    albuterol (PROVENTIL HFA;VENTOLIN HFA) 108 (90 Base) MCG/ACT inhaler  Inhale into the lungs every 6 (six) hours as needed for wheezing or shortness of breath.   amoxicillin-clavulanate (AUGMENTIN) 875-125 MG tablet daily.   baclofen (LIORESAL) 20 MG tablet Take 20 mg by mouth as needed.   cyclobenzaprine (FLEXERIL) 10 MG tablet Take 10 mg by mouth as needed.   diclofenac Sodium (VOLTAREN) 1 % GEL Apply 2 g topically as needed.   EPINEPHrine 0.3 mg/0.3 mL IJ SOAJ injection Inject 0.3 mg into the muscle once. As needed for allergic reaction   ergocalciferol (VITAMIN D2) 1.25 MG (50000 UT) capsule once a week.   fludrocortisone (FLORINEF) 0.1 MG tablet Take 100 mcg by mouth daily.   gabapentin (NEURONTIN) 300 MG capsule Take by mouth 3 (three) times daily.   ipratropium-albuterol (DUONEB) 0.5-2.5 (3) MG/3ML SOLN USE 1 SOLUTION IN NEBULIZER 4 TIMES DAILY AS NEEDED   levalbuterol (XOPENEX HFA) 45 MCG/ACT inhaler Inhale 2 puffs into the lungs every 8 (eight) hours as needed for wheezing or shortness of breath.    levalbuterol (XOPENEX) 1.25 MG/3ML nebulizer solution Take 1.25 mg by nebulization every 8 (eight) hours as needed for wheezing or shortness of breath.    nitroGLYCERIN (NITROSTAT) 0.4 MG SL tablet Place 1 tablet (0.4 mg total) under the tongue every 5 (five) minutes as needed for chest pain.   NONFORMULARY OR COMPOUNDED ITEM Apply 1 application  topically 4 (four) times daily. Katamine/Gabapentin/Baclofen/Lidocaine/Menthol Cream   norgestimate-ethinyl estradiol (ORTHO-CYCLEN) 0.25-35 MG-MCG tablet Take 1 tablet by mouth daily.   ondansetron (ZOFRAN-ODT) 8 MG disintegrating tablet Take by mouth as needed.   predniSONE (DELTASONE) 20 MG tablet Take 60 mg by mouth daily. approx 2-3 days   RESTASIS MULTIDOSE 0.05 % ophthalmic emulsion Place 1 drop into both eyes 2 (two) times daily.    SSD 1 % cream as needed.     Allergies:   Adhesive [tape], Blue dyes (parenteral), Contrast media [iodinated diagnostic agents], Corn-containing products, Green dyes, Nickel, Onion,  Other, Petroleum distillate, Silicone, Bee venom, Cortisporin [bacitra-neomycin-polymyxin-hc], and Yellow jacket venom   Social History   Socioeconomic History   Marital status: Single    Spouse name: Not on file   Number of children: Not on file   Years of education: Not on file   Highest education level: Not on file  Occupational History   Not on file  Tobacco Use   Smoking status: Some Days    Packs/day: 0.01    Years: 2.00    Pack years: 0.02    Types: Cigarettes    Last attempt to quit: 02/14/2014    Years since quitting: 7.0   Smokeless tobacco: Never   Tobacco comments:    rare, social smoker  Vaping Use   Vaping Use: Some days  Substance and Sexual Activity   Alcohol use: Yes    Alcohol/week: 0.0 standard drinks    Comment: occ   Drug use: No   Sexual activity: Not on file  Other Topics Concern   Not on file  Social History Narrative   Not on file   Social Determinants of Health   Financial Resource Strain: Not on file  Food Insecurity: Not on file  Transportation Needs: Not on file  Physical Activity: Not on file  Stress: Not on file  Social Connections: Not on file    Social: Comes to most visits with her health care POA  Family History: The patient's family history includes Arrhythmia in her mother. There is no history of Colon cancer.  ROS:   Please see the history of present illness.     All other systems reviewed and are negative.  EKGs/Labs/Other Studies Reviewed:    The following studies were reviewed today:  EKG:   11/09/20: SR 75   Cardiac Event Monitoring: Date: 01/25/2020 Results: Minimum HR (bpm): 52 Maximum HR (bpm): 164 14% burden of tachycardia   Supraventricular Ectopy: <1% SVT: none   Ventricular Ectopy: <1% NSVT: none Ventricular Tachycardia: none   Pauses: none AV block: none   Atrial fibrillation: none   Diary events: Diary events of dizziness, lightheadedness, chest pain and pressure, SOB, rapid or fast  heartbeat, tired or fatigued associated primarily with sinus tachycardia, and sinus rhythm.    IMPRESSION: Symptomatic sinus tachycardia.   Transthoracic Echocardiogram: Date: 12/11/2016 Results: Study Conclusions   - Left ventricle: The cavity size was normal. Wall thickness was    normal. Systolic function was normal. The estimated ejection    fraction was in the range of 60% to 65%. Wall motion was normal;    there were no regional wall motion abnormalities. Left    ventricular diastolic function parameters were normal.     Recent Labs: 04/13/2020: ALT 14; TSH 1.67 11/09/2020: BUN 15; Creatinine, Ser 0.66; Hemoglobin 14.2; Platelets 242; Potassium 4.2; Sodium 140  Recent Lipid Panel    Component Value Date/Time   CHOL 193 04/13/2020 0000  TRIG 75 04/13/2020 0000   HDL 55 04/13/2020 0000   LDLCALC 124 04/13/2020 0000       Physical Exam:    VS:  BP 122/60   Pulse 74   Ht 5\' 1"  (1.549 m)   Wt 114 lb 3.2 oz (51.8 kg)   SpO2 100%   BMI 21.58 kg/m     Wt Readings from Last 3 Encounters:  02/20/21 114 lb 3.2 oz (51.8 kg)  11/09/20 112 lb 3.2 oz (50.9 kg)  03/16/20 117 lb (53.1 kg)    Gen: moderate distress  Neck: No JVD,  Ears: no Pilar Plate Sign Cardiac: No Rubs or Gallops, no Murmur, regular tachycardia cardia, +2 radial pulses Respiratory: Clear to auscultation bilaterally, normal effort, normal  respiratory rate GI: Soft, nontender, non-distended  MS: No  edema;  moves all extremities Integument: Skin feels warm Neuro:  At time of evaluation, alert and oriented to person/place/time/situation  Psych: Anxious affect, patient feels worse  ASSESSMENT:    1. Chest pain, unspecified type   2. Palpitations     PLAN:    Precordial chest pain Palpitations  Syncope Concern for POTS (she has been referred to [facility]) - has not tolerated propranolol in the past (concern for hypotension) - ivabradine could be used; we discussed this and discussed the risks of  bradycardia; patient is amenable to trial of heart monitor, different that prior: ziopatch 2 weeks - we have discussed compressions stockings, abdominal binders, and exercises for POTS - we discussed my concerns about her having obstructive CAD (I do no think this is the case, but she notes a history of NSTEMI by troponin).  We are unable to do dobutamine stress at our facility, stress MRI was not able to be done.  She cannot get a CCTA. - we discussed the risks and benefits of radiation in a young patient, but as this is the best test we could use to see if she have coronary artery disease, she and her team are willing to take the risk - we have discussed that I am not a specialist in her disease process, have offered referrals in this regard; presently patient and team wish for me to be cardiologist, will continue unless she changes her mind or after [facility] eval  Would be reasonable for me four months me or APP Follow up   Time Spent Directly with Patient:   I have spent a total of 40 minutes with the patient reviewing notes, imaging, EKGs, labs and examining the patient as well as establishing an assessment and plan that was discussed personally with the patient.  > 50% of time was spent in direct patient care and  family.    Medication Adjustments/Labs and Tests Ordered: Current medicines are reviewed at length with the patient today.  Concerns regarding medicines are outlined above.  Orders Placed This Encounter  Procedures   Cardiac Stress Test: Informed Consent Details: Physician/Practitioner Attestation; Transcribe to consent form and obtain patient signature   MYOCARDIAL PERFUSION IMAGING   LONG TERM MONITOR (3-14 DAYS)    No orders of the defined types were placed in this encounter.   Patient Instructions  Medication Instructions:  Your physician recommends that you continue on your current medications as directed. Please refer to the Current Medication list given to you  today.  *If you need a refill on your cardiac medications before your next appointment, please call your pharmacy*   Lab Work: NONE If you have labs (blood work) drawn today and  your tests are completely normal, you will receive your results only by: MyChart Message (if you have MyChart) OR A paper copy in the mail If you have any lab test that is abnormal or we need to change your treatment, we will call you to review the results.   Testing/Procedures: Your physician has requested that you have a lexiscan myoview.   Please follow instruction sheet, as given.    Your physician has requested that you wear a 14 day heart monitor.    Follow-Up: At [facility], you and your health needs are our priority.  As part of our continuing mission to provide you with exceptional heart care, we have created designated Provider Care Teams.  These Care Teams include your primary Cardiologist (physician) and Advanced Practice Providers (APPs -  Physician Assistants and Nurse Practitioners) who all work together to provide you with the care you need, when you need it.   Your next appointment:   4 month(s)  The format for your next appointment:   In Person  Provider:   Werner Lean, MD  or Melina Copa, PA-C or Ermalinda Barrios, PA-C          Other Instructions   You are scheduled for a Myocardial Perfusion Imaging Study. Please arrive 15 minutes prior to your appointment time for registration and insurance purposes.   The test will take approximately 3 to 4 hours to complete; you may bring reading material.  If someone comes with you to your appointment, they will need to remain in the main lobby due to limited space in the testing area. **If you are pregnant or breastfeeding, please notify the nuclear lab prior to your appointment**   How to prepare for your Myocardial Perfusion Test: Do not eat or drink 3 hours prior to your test, except you may have water. Do not consume  products containing caffeine (regular or decaffeinated) 12 hours prior to your test. (ex: coffee, chocolate, sodas, tea). Do bring a list of your current medications with you.  If not listed below, you may take your medications as normal. Do wear comfortable clothes (no dresses or overalls) and walking shoes, tennis shoes preferred (No heels or open toe shoes are allowed). Do NOT wear cologne, perfume, aftershave, or lotions (deodorant is allowed). If these instructions are not followed, your test will have to be rescheduled.  If you cannot keep your appointment, please provide 24 hours notification to the Nuclear Lab, to avoid a possible $50 charge to your account.        ZIO XT- Long Term Monitor Instructions  Your physician has requested you wear a ZIO patch monitor for 14 days.  This is a single patch monitor. [employer] supplies one patch monitor per enrollment. Additional stickers are not available. Please do not apply patch if you will be having a Nuclear Stress Test,  Echocardiogram, Cardiac CT, MRI, or Chest Xray during the period you would be wearing the  monitor. The patch cannot be worn during these tests. You cannot remove and re-apply the  ZIO XT patch monitor.  Your ZIO patch monitor will be mailed 3 day USPS to your address on file. It may take 3-5 days  to receive your monitor after you have been enrolled.  Once you have received your monitor, please review the enclosed instructions. Your monitor  has already been registered assigning a specific monitor serial # to you.  Billing and Patient Assistance Program Information  We have supplied [employer] with any of  your insurance information on file for billing purposes. [employer] offers a sliding scale Patient Assistance Program for patients that do not have  insurance, or whose insurance does not completely cover the cost of the ZIO monitor.  You must apply for the Patient Assistance Program to qualify for this discounted rate.   To apply, please call [employer] at [phone], select option 4, select option 2, ask to apply for  Patient Assistance Program. Theodore Demark will ask your household income, and how many people  are in your household. They will quote your out-of-pocket cost based on that information.  [employer] will also be able to set up a 65-month, interest-free payment plan if needed.  Applying the monitor   Shave hair from upper left chest.  Hold abrader disc by orange tab. Rub abrader in 40 strokes over the upper left chest as  indicated in your monitor instructions.  Clean area with 4 enclosed alcohol pads. Let dry.  Apply patch as indicated in monitor instructions. Patch will be placed under collarbone on left  side of chest with arrow pointing upward.  Rub patch adhesive wings for 2 minutes. Remove white label marked "1". Remove the white  label marked "2". Rub patch adhesive wings for 2 additional minutes.  While looking in a mirror, press and release button in center of patch. A small green light will  flash 3-4 times. This will be your only indicator that the monitor has been turned on.  Do not shower for the first 24 hours. You may shower after the first 24 hours.  Press the button if you feel a symptom. You will hear a small click. Record Date, Time and  Symptom in the Patient Logbook.  When you are ready to remove the patch, follow instructions on the last 2 pages of Patient  Logbook. Stick patch monitor onto the last page of Patient Logbook.  Place Patient Logbook in the blue and white box. Use locking tab on box and tape box closed  securely. The blue and white box has prepaid postage on it. Please place it in the mailbox as  soon as possible. Your physician should have your test results approximately 7 days after the  monitor has been mailed back to [facility].  Call [place] at [phone] if you have questions regarding  your ZIO XT patch monitor. Call them immediately  if you see an orange light blinking on your  monitor.  If your monitor falls off in less than 4 days, contact our Monitor department at [phone].  If your monitor becomes loose or falls off after 4 days call [employer] at [phone] for  suggestions on securing your monitor     Signed, Werner Lean, MD  02/20/2021 3:01 PM    [place]

## 2021-02-20 ENCOUNTER — Encounter: Payer: Self-pay | Admitting: Internal Medicine

## 2021-02-20 ENCOUNTER — Ambulatory Visit (INDEPENDENT_AMBULATORY_CARE_PROVIDER_SITE_OTHER): Payer: Medicare Other

## 2021-02-20 ENCOUNTER — Other Ambulatory Visit: Payer: Self-pay

## 2021-02-20 ENCOUNTER — Ambulatory Visit (INDEPENDENT_AMBULATORY_CARE_PROVIDER_SITE_OTHER): Payer: Medicare Other | Admitting: Internal Medicine

## 2021-02-20 VITALS — BP 122/60 | HR 74 | Ht 61.0 in | Wt 114.2 lb

## 2021-02-20 DIAGNOSIS — R079 Chest pain, unspecified: Secondary | ICD-10-CM

## 2021-02-20 DIAGNOSIS — R002 Palpitations: Secondary | ICD-10-CM

## 2021-02-20 NOTE — Patient Instructions (Signed)
Medication Instructions:  Your physician recommends that you continue on your current medications as directed. Please refer to the Current Medication list given to you today.  *If you need a refill on your cardiac medications before your next appointment, please call your pharmacy*   Lab Work: NONE If you have labs (blood work) drawn today and your tests are completely normal, you will receive your results only by: [place] (if you have MyChart) OR A paper copy in the mail If you have any lab test that is abnormal or we need to change your treatment, we will call you to review the results.   Testing/Procedures: Your physician has requested that you have a lexiscan myoview.   Please follow instruction sheet, as given.    Your physician has requested that you wear a 14 day heart monitor.    Follow-Up: At [facility], you and your health needs are our priority.  As part of our continuing mission to provide you with exceptional heart care, we have created designated Provider Care Teams.  These Care Teams include your primary Cardiologist (physician) and Advanced Practice Providers (APPs -  Physician Assistants and Nurse Practitioners) who all work together to provide you with the care you need, when you need it.   Your next appointment:   4 month(s)  The format for your next appointment:   In Person  Provider:   Werner Lean, MD  or Melina Copa, PA-C or Ermalinda Barrios, PA-C          Other Instructions   You are scheduled for a Myocardial Perfusion Imaging Study. Please arrive 15 minutes prior to your appointment time for registration and insurance purposes.   The test will take approximately 3 to 4 hours to complete; you may bring reading material.  If someone comes with you to your appointment, they will need to remain in the main lobby due to limited space in the testing area. **If you are pregnant or breastfeeding, please notify the nuclear lab prior to your  appointment**   How to prepare for your Myocardial Perfusion Test: Do not eat or drink 3 hours prior to your test, except you may have water. Do not consume products containing caffeine (regular or decaffeinated) 12 hours prior to your test. (ex: coffee, chocolate, sodas, tea). Do bring a list of your current medications with you.  If not listed below, you may take your medications as normal. Do wear comfortable clothes (no dresses or overalls) and walking shoes, tennis shoes preferred (No heels or open toe shoes are allowed). Do NOT wear cologne, perfume, aftershave, or lotions (deodorant is allowed). If these instructions are not followed, your test will have to be rescheduled.  If you cannot keep your appointment, please provide 24 hours notification to the Nuclear Lab, to avoid a possible $50 charge to your account.        ZIO XT- Long Term Monitor Instructions  Your physician has requested you wear a ZIO patch monitor for 14 days.  This is a single patch monitor. [employer] supplies one patch monitor per enrollment. Additional stickers are not available. Please do not apply patch if you will be having a Nuclear Stress Test,  Echocardiogram, Cardiac CT, MRI, or Chest Xray during the period you would be wearing the  monitor. The patch cannot be worn during these tests. You cannot remove and re-apply the  ZIO XT patch monitor.  Your ZIO patch monitor will be mailed 3 day USPS to your address on  file. It may take 3-5 days  to receive your monitor after you have been enrolled.  Once you have received your monitor, please review the enclosed instructions. Your monitor  has already been registered assigning a specific monitor serial # to you.  Billing and Patient Assistance Program Information  We have supplied [employer] with any of your insurance information on file for billing purposes. [employer] offers a sliding scale Patient Assistance Program for patients that do not have  insurance, or  whose insurance does not completely cover the cost of the ZIO monitor.  You must apply for the Patient Assistance Program to qualify for this discounted rate.  To apply, please call [employer] at [phone], select option 4, select option 2, ask to apply for  Patient Assistance Program. Theodore Demark will ask your household income, and how many people  are in your household. They will quote your out-of-pocket cost based on that information.  [employer] will also be able to set up a 49-month, interest-free payment plan if needed.  Applying the monitor   Shave hair from upper left chest.  Hold abrader disc by orange tab. Rub abrader in 40 strokes over the upper left chest as  indicated in your monitor instructions.  Clean area with 4 enclosed alcohol pads. Let dry.  Apply patch as indicated in monitor instructions. Patch will be placed under collarbone on left  side of chest with arrow pointing upward.  Rub patch adhesive wings for 2 minutes. Remove white label marked "1". Remove the white  label marked "2". Rub patch adhesive wings for 2 additional minutes.  While looking in a mirror, press and release button in center of patch. A small green light will  flash 3-4 times. This will be your only indicator that the monitor has been turned on.  Do not shower for the first 24 hours. You may shower after the first 24 hours.  Press the button if you feel a symptom. You will hear a small click. Record Date, Time and  Symptom in the Patient Logbook.  When you are ready to remove the patch, follow instructions on the last 2 pages of Patient  Logbook. Stick patch monitor onto the last page of Patient Logbook.  Place Patient Logbook in the blue and white box. Use locking tab on box and tape box closed  securely. The blue and white box has prepaid postage on it. Please place it in the mailbox as  soon as possible. Your physician should have your test results approximately 7 days after the  monitor has been mailed  back to [facility].  Call [place] at [phone] if you have questions regarding  your ZIO XT patch monitor. Call them immediately if you see an orange light blinking on your  monitor.  If your monitor falls off in less than 4 days, contact our Monitor department at [phone].  If your monitor becomes loose or falls off after 4 days call [employer] at [phone] for  suggestions on securing your monitor

## 2021-02-20 NOTE — Progress Notes (Unsigned)
Enrolled patient for a 14 day ZIo XT monitor to be mailed to patients home  

## 2021-02-21 ENCOUNTER — Telehealth (HOSPITAL_COMMUNITY): Payer: Self-pay | Admitting: *Deleted

## 2021-02-21 NOTE — Telephone Encounter (Signed)
Patient's POA per DPR  was given detailed instructions per Myocardial Perfusion Study Information Sheet for the test on 02/27/21 at 0800. Patient notified to arrive 15 minutes early and that it is imperative to arrive on time for appointment to keep from having the test rescheduled.  If you need to cancel or reschedule your appointment, please call the office within 24 hours of your appointment. . Patient verbalized understanding., Ranae Palms

## 2021-02-21 NOTE — Telephone Encounter (Signed)
Patient's POA is returning call. She states she would like to go over the instructions and she has additional questions that she would prefer to have answered by someone in the nuclear department. Please return call when able.

## 2021-02-27 ENCOUNTER — Other Ambulatory Visit: Payer: Self-pay

## 2021-02-27 ENCOUNTER — Ambulatory Visit (HOSPITAL_COMMUNITY): Payer: Medicare Other | Attending: Internal Medicine

## 2021-02-27 DIAGNOSIS — R079 Chest pain, unspecified: Secondary | ICD-10-CM | POA: Diagnosis not present

## 2021-02-27 LAB — MYOCARDIAL PERFUSION IMAGING
LV dias vol: 58 mL (ref 46–106)
LV sys vol: 15 mL
Nuc Stress EF: 73 %
Peak HR: 129 {beats}/min
Rest HR: 87 {beats}/min
Rest Nuclear Isotope Dose: 10.8 mCi
SDS: 3
SRS: 5
SSS: 8
ST Depression (mm): 0 mm
Stress Nuclear Isotope Dose: 31.7 mCi
TID: 1.03

## 2021-02-27 MED ORDER — TECHNETIUM TC 99M TETROFOSMIN IV KIT
31.7000 | PACK | Freq: Once | INTRAVENOUS | Status: AC | PRN
  Administered 2021-02-27: 31.7 via INTRAVENOUS
  Filled 2021-02-27: qty 32

## 2021-02-27 MED ORDER — REGADENOSON 0.4 MG/5ML IV SOLN
0.4000 mg | Freq: Once | INTRAVENOUS | Status: AC
  Administered 2021-02-27: 0.4 mg via INTRAVENOUS

## 2021-02-27 MED ORDER — TECHNETIUM TC 99M TETROFOSMIN IV KIT
10.8000 | PACK | Freq: Once | INTRAVENOUS | Status: AC | PRN
  Administered 2021-02-27: 08:00:00 10.8 via INTRAVENOUS
  Filled 2021-02-27: qty 11

## 2021-03-01 ENCOUNTER — Ambulatory Visit: Payer: Medicare Other | Admitting: Internal Medicine

## 2021-03-02 DIAGNOSIS — R079 Chest pain, unspecified: Secondary | ICD-10-CM

## 2021-03-02 DIAGNOSIS — R002 Palpitations: Secondary | ICD-10-CM

## 2021-03-21 DIAGNOSIS — R002 Palpitations: Secondary | ICD-10-CM | POA: Diagnosis not present

## 2021-03-21 DIAGNOSIS — R079 Chest pain, unspecified: Secondary | ICD-10-CM | POA: Diagnosis not present

## 2021-03-22 ENCOUNTER — Telehealth: Payer: Self-pay

## 2021-03-22 NOTE — Telephone Encounter (Signed)
Spoke with pt's POA and pt via speaker phone and advised per Dr Gasper Sells pt's monitor shows no evidence of heart rhythm disease but does offer a trial of Propranolol 80mg  for symptoms.  Pt's POA immediately declines states pt has tried this medication and could not tolerate due to hypotension.  Pt's POA also reports pt did have an allergic reaction to the adhesive on the monitor and has a reddened area that she is applying Silvadene cream to that was prescribed by her PCP.  POA asks if Dr Julieanne Manson would consider sending in a larger tub of the Silvadene cream as the tube is not going to be sufficient.   Pt advised will forward to Dr Dimas Chyle to make him aware of allergic reaction to adhesive.

## 2021-03-22 NOTE — Telephone Encounter (Signed)
-----   Message from Werner Lean, MD sent at 03/22/2021  2:52 PM EST ----- There is no evidence of heart rhythm disease. I am willing to offer a trial of propranolol XL 80 mg PO daily to help her symptoms.   I suspect she will decline, if she accepts we will need to have her follow her blood pressure ----- Message ----- From: Werner Lean, MD Sent: 03/22/2021  11:30 AM EST To: Werner Lean, MD

## 2021-03-23 MED ORDER — SSD 1 % EX CREA: TOPICAL_CREAM | CUTANEOUS | 0 refills | Status: DC | PRN

## 2021-03-23 NOTE — Telephone Encounter (Signed)
Spoke with POA Kyra Manges informed her that MD will prescribe silvadene X1 with no refills.  She thanked me for calling.  Orders placed.

## 2021-03-23 NOTE — Telephone Encounter (Signed)
Refilled current script.

## 2021-03-23 NOTE — Addendum Note (Signed)
Addended by: Precious Gilding on: 03/23/2021 01:11 PM   Modules accepted: Orders

## 2021-05-15 DIAGNOSIS — G909 Disorder of the autonomic nervous system, unspecified: Secondary | ICD-10-CM | POA: Diagnosis not present

## 2021-05-15 DIAGNOSIS — R21 Rash and other nonspecific skin eruption: Secondary | ICD-10-CM | POA: Diagnosis not present

## 2021-05-15 DIAGNOSIS — G8929 Other chronic pain: Secondary | ICD-10-CM | POA: Diagnosis not present

## 2021-05-15 DIAGNOSIS — R079 Chest pain, unspecified: Secondary | ICD-10-CM | POA: Diagnosis not present

## 2021-05-15 DIAGNOSIS — R6889 Other general symptoms and signs: Secondary | ICD-10-CM | POA: Diagnosis not present

## 2021-05-15 DIAGNOSIS — I10 Essential (primary) hypertension: Secondary | ICD-10-CM | POA: Diagnosis not present

## 2021-05-15 DIAGNOSIS — R5383 Other fatigue: Secondary | ICD-10-CM | POA: Diagnosis not present

## 2021-05-15 DIAGNOSIS — F3181 Bipolar II disorder: Secondary | ICD-10-CM | POA: Diagnosis not present

## 2021-05-15 DIAGNOSIS — G603 Idiopathic progressive neuropathy: Secondary | ICD-10-CM | POA: Diagnosis not present

## 2021-05-15 DIAGNOSIS — Z6822 Body mass index (BMI) 22.0-22.9, adult: Secondary | ICD-10-CM | POA: Diagnosis not present

## 2021-05-15 DIAGNOSIS — F4001 Agoraphobia with panic disorder: Secondary | ICD-10-CM | POA: Diagnosis not present

## 2021-05-15 DIAGNOSIS — E538 Deficiency of other specified B group vitamins: Secondary | ICD-10-CM | POA: Diagnosis not present

## 2021-05-15 DIAGNOSIS — R002 Palpitations: Secondary | ICD-10-CM | POA: Diagnosis not present

## 2021-05-15 DIAGNOSIS — G901 Familial dysautonomia [Riley-Day]: Secondary | ICD-10-CM | POA: Diagnosis not present

## 2021-05-15 DIAGNOSIS — J45909 Unspecified asthma, uncomplicated: Secondary | ICD-10-CM | POA: Diagnosis not present

## 2021-05-15 DIAGNOSIS — F4310 Post-traumatic stress disorder, unspecified: Secondary | ICD-10-CM | POA: Diagnosis not present

## 2021-05-15 DIAGNOSIS — R238 Other skin changes: Secondary | ICD-10-CM | POA: Diagnosis not present

## 2021-05-15 DIAGNOSIS — E559 Vitamin D deficiency, unspecified: Secondary | ICD-10-CM | POA: Diagnosis not present

## 2021-05-15 DIAGNOSIS — M419 Scoliosis, unspecified: Secondary | ICD-10-CM | POA: Diagnosis not present

## 2021-06-12 DIAGNOSIS — G603 Idiopathic progressive neuropathy: Secondary | ICD-10-CM | POA: Diagnosis not present

## 2021-06-12 DIAGNOSIS — M4134 Thoracogenic scoliosis, thoracic region: Secondary | ICD-10-CM | POA: Diagnosis not present

## 2021-06-12 DIAGNOSIS — G4489 Other headache syndrome: Secondary | ICD-10-CM | POA: Diagnosis not present

## 2021-06-12 DIAGNOSIS — E649 Sequelae of unspecified nutritional deficiency: Secondary | ICD-10-CM | POA: Diagnosis not present

## 2021-06-12 DIAGNOSIS — M5416 Radiculopathy, lumbar region: Secondary | ICD-10-CM | POA: Diagnosis not present

## 2021-06-12 DIAGNOSIS — G909 Disorder of the autonomic nervous system, unspecified: Secondary | ICD-10-CM | POA: Diagnosis not present

## 2021-06-12 DIAGNOSIS — I951 Orthostatic hypotension: Secondary | ICD-10-CM | POA: Diagnosis not present

## 2021-06-12 DIAGNOSIS — F4001 Agoraphobia with panic disorder: Secondary | ICD-10-CM | POA: Diagnosis not present

## 2021-06-18 ENCOUNTER — Ambulatory Visit: Payer: Medicare Other | Admitting: Internal Medicine

## 2021-07-03 ENCOUNTER — Other Ambulatory Visit (HOSPITAL_COMMUNITY): Payer: Self-pay | Admitting: Neurology

## 2021-07-03 ENCOUNTER — Other Ambulatory Visit: Payer: Self-pay | Admitting: Neurology

## 2021-07-03 DIAGNOSIS — G4452 New daily persistent headache (NDPH): Secondary | ICD-10-CM

## 2021-07-06 ENCOUNTER — Other Ambulatory Visit: Payer: Self-pay | Admitting: Neurology

## 2021-07-06 DIAGNOSIS — G4452 New daily persistent headache (NDPH): Secondary | ICD-10-CM

## 2021-07-09 NOTE — Progress Notes (Deleted)
Cardiology Office Note:    Date:  07/09/2021   ID:  Traci Hensley, DOB Jun 14, 1990, MRN [id number]  PCP:  Celene Squibb, MD   [facility] Providers Cardiologist:  Werner Lean, MD     Referring MD: Celene Squibb, MD   CC: Follow up CP  Has had prior cardiology (prior Shaune Pascal)  History of Present Illness:    Traci Hensley is a 31 y.o. female with a hx of PTSD with query of inappropriate sinus tachycardia vs POTS scoliosis who presents 11/09/20.  In interim of this visit, we were unable to perform Stress MRI at our facility (see phone notes for more details).  2022:  Negative stress test. Unremarkable heart monitor.  Patient notes that she is doing ***.   Since day prior/last visit notes *** . There are no*** interval hospital/ED visit.    No chest pain or pressure ***.  No SOB/DOE*** and no PND/Orthopnea***.  No weight gain or leg swelling***.  No palpitations or syncope ***.  Ambulatory blood pressure ***.     Past Medical History:  Diagnosis Date   Agoraphobia    Anxiety    Bipolar 1 disorder (HCC)    Chronic abdominal pain    Chronic chest pain    Depression    PTSD (post-traumatic stress disorder)    Scoliosis     Past Surgical History:  Procedure Laterality Date   MOUTH SURGERY      Current Medications: No outpatient medications have been marked as taking for the 07/10/21 encounter (Appointment) with Werner Lean, MD.     Allergies:   Adhesive [tape], Blue dyes (parenteral), Contrast media [iodinated contrast media], Corn-containing products, Green dyes, Nickel, Onion, Other, Petroleum distillate, Silicone, Bee venom, Cortisporin [bacitra-neomycin-polymyxin-hc], and Yellow jacket venom   Social History   Socioeconomic History   Marital status: Single    Spouse name: Not on file   Number of children: Not on file   Years of education: Not on file   Highest education level:  Not on file  Occupational History   Not on file  Tobacco Use   Smoking status: Some Days    Packs/day: 0.01    Years: 2.00    Pack years: 0.02    Types: Cigarettes    Last attempt to quit: 02/14/2014    Years since quitting: 7.4   Smokeless tobacco: Never   Tobacco comments:    rare, social smoker  Vaping Use   Vaping Use: Some days  Substance and Sexual Activity   Alcohol use: Yes    Alcohol/week: 0.0 standard drinks    Comment: occ   Drug use: No   Sexual activity: Not on file  Other Topics Concern   Not on file  Social History Narrative   Not on file   Social Determinants of Health   Financial Resource Strain: Not on file  Food Insecurity: Not on file  Transportation Needs: Not on file  Physical Activity: Not on file  Stress: Not on file  Social Connections: Not on file    Social: Comes to most visits with her health care POA  Family History: The patient's family history includes Arrhythmia in her mother. There is no history of Colon cancer.  ROS:   Please see the history of present illness.     All other systems reviewed and are negative.  EKGs/Labs/Other Studies Reviewed:    The following studies were reviewed today:  EKG:  11/09/20: SR 75   Cardiac Event Monitoring: Date: 01/25/2020 Results: Minimum HR (bpm): 52 Maximum HR (bpm): 164 14% burden of tachycardia   Supraventricular Ectopy: <1% SVT: none   Ventricular Ectopy: <1% NSVT: none Ventricular Tachycardia: none   Pauses: none AV block: none   Atrial fibrillation: none   Diary events: Diary events of dizziness, lightheadedness, chest pain and pressure, SOB, rapid or fast heartbeat, tired or fatigued associated primarily with sinus tachycardia, and sinus rhythm.    IMPRESSION: Symptomatic sinus tachycardia.   Transthoracic Echocardiogram: Date: 12/11/2016 Results: Study Conclusions   - Left ventricle: The cavity size was normal. Wall thickness was    normal. Systolic function was  normal. The estimated ejection    fraction was in the range of 60% to 65%. Wall motion was normal;    there were no regional wall motion abnormalities. Left    ventricular diastolic function parameters were normal.     Recent Labs: 11/09/2020: BUN 15; Creatinine, Ser 0.66; Hemoglobin 14.2; Platelets 242; Potassium 4.2; Sodium 140  Recent Lipid Panel    Component Value Date/Time   CHOL 193 04/13/2020 0000   TRIG 75 04/13/2020 0000   HDL 55 04/13/2020 0000   LDLCALC 124 04/13/2020 0000       Physical Exam:    VS:  There were no vitals taken for this visit.    Wt Readings from Last 3 Encounters:  02/27/21 114 lb (51.7 kg)  02/20/21 114 lb 3.2 oz (51.8 kg)  11/09/20 112 lb 3.2 oz (50.9 kg)    Gen: *** distress, *** obese/well nourished/malnourished   Neck: No JVD, *** carotid bruit Ears: *** Frank Sign Cardiac: No Rubs or Gallops, *** Murmur, ***cardia, *** radial pulses Respiratory: Clear to auscultation bilaterally, *** effort, ***  respiratory rate GI: Soft, nontender, non-distended *** MS: No *** edema; *** moves all extremities Integument: Skin feels *** Neuro:  At time of evaluation, alert and oriented to person/place/time/situation *** Psych: Normal affect, patient feels ***   ASSESSMENT:    No diagnosis found.   PLAN:     Palpitations  Non Cardiac CP Syncope Concern for POTS (she has been referred to [facility]) - has not tolerated propranolol in the past (concern for hypotension) - ivabradine could be used; we discussed this and discussed the risks of bradycardia; patient is amenable to trial of heart monitor *** - we have discussed compressions stockings, abdominal binders, and exercises for POTS - has been referred to [facility] for dyautonomia  One year me or APP      Medication Adjustments/Labs and Tests Ordered: Current medicines are reviewed at length with the patient today.  Concerns regarding medicines are outlined above.  No orders of the  defined types were placed in this encounter.   No orders of the defined types were placed in this encounter.    There are no Patient Instructions on file for this visit.   Signed, Werner Lean, MD  07/09/2021 8:33 AM    [facility]

## 2021-07-10 ENCOUNTER — Ambulatory Visit: Payer: Medicare Other | Admitting: Internal Medicine

## 2021-07-16 ENCOUNTER — Ambulatory Visit
Admission: RE | Admit: 2021-07-16 | Discharge: 2021-07-16 | Disposition: A | Discharge status: Home / Self Care | Payer: Medicare Other | Source: Ambulatory Visit | Attending: Neurology | Admitting: Neurology

## 2021-07-16 DIAGNOSIS — R519 Headache, unspecified: Secondary | ICD-10-CM | POA: Diagnosis not present

## 2021-07-16 DIAGNOSIS — G4452 New daily persistent headache (NDPH): Secondary | ICD-10-CM

## 2021-08-07 ENCOUNTER — Other Ambulatory Visit: Payer: Self-pay | Admitting: Neurology

## 2021-09-07 ENCOUNTER — Other Ambulatory Visit: Payer: Self-pay

## 2021-09-07 ENCOUNTER — Emergency Department (HOSPITAL_COMMUNITY)
Admission: EM | Admit: 2021-09-07 | Discharge: 2021-09-07 | Disposition: A | Discharge status: Home / Self Care | Payer: Medicare Other | Attending: Emergency Medicine | Admitting: Emergency Medicine

## 2021-09-07 ENCOUNTER — Encounter (HOSPITAL_COMMUNITY): Payer: Self-pay

## 2021-09-07 DIAGNOSIS — R519 Headache, unspecified: Secondary | ICD-10-CM | POA: Diagnosis not present

## 2021-09-07 MED ORDER — SODIUM CHLORIDE 0.9 % IV BOLUS
1000.0000 mL | Freq: Once | INTRAVENOUS | Status: AC
  Administered 2021-09-07: 1000 mL via INTRAVENOUS

## 2021-09-07 MED ORDER — DIPHENHYDRAMINE HCL 50 MG/ML IJ SOLN
25.0000 mg | Freq: Once | INTRAMUSCULAR | Status: DC
  Filled 2021-09-07: qty 1

## 2021-09-07 MED ORDER — KETOROLAC TROMETHAMINE 30 MG/ML IJ SOLN
30.0000 mg | Freq: Once | INTRAMUSCULAR | Status: AC
Start: 2021-09-07 — End: 2021-09-07
  Administered 2021-09-07: 30 mg via INTRAVENOUS
  Filled 2021-09-07: qty 1

## 2021-09-07 MED ORDER — METOCLOPRAMIDE HCL 5 MG/ML IJ SOLN
10.0000 mg | Freq: Once | INTRAMUSCULAR | Status: AC
Start: 2021-09-07 — End: 2021-09-07
  Administered 2021-09-07: 10 mg via INTRAVENOUS
  Filled 2021-09-07: qty 2

## 2021-09-07 MED ORDER — DEXAMETHASONE SODIUM PHOSPHATE 10 MG/ML IJ SOLN
10.0000 mg | Freq: Once | INTRAMUSCULAR | Status: AC
  Administered 2021-09-07: 10 mg via INTRAVENOUS
  Filled 2021-09-07: qty 1

## 2021-09-07 NOTE — ED Provider Notes (Signed)
[facility] Provider Note   CSN: [id number] Arrival date & time: 09/07/21  0250     History  Chief Complaint  Patient presents with   Headache    Traci Hensley is a 31 y.o. female.  Patient is a 31 year old female with past medical history of migraine headaches, suspected POTS, chronic fatigue, and anxiety.  Patient presenting today with complaints of headache.  This started approximately 2 hours prior to presentation.  She describes severe, sharp pain to the top of her head that has been persistent since its onset.  Normally she is able to take Tylenol and ibuprofen and apply ice to her head to alleviate the symptoms, however this does not seem to be working this evening.  She denies any fevers or chills.  She denies neck pain.  She denies weakness, numbness, or visual disturbances.  Her neurologist is Dr. Merlene Laughter.  The history is provided by the patient.       Home Medications Prior to Admission medications   Medication Sig Start Date End Date Taking? Authorizing Provider  ADVAIR DISKUS 500-50 MCG/DOSE AEPB Inhale 1 puff into the lungs 2 (two) times daily as needed (for shortness of breath).  07/25/14   [provider]  albuterol (PROVENTIL HFA;VENTOLIN HFA) 108 (90 Base) MCG/ACT inhaler Inhale into the lungs every 6 (six) hours as needed for wheezing or shortness of breath.    [provider]  amoxicillin-clavulanate (AUGMENTIN) 875-125 MG tablet daily. 02/12/21   [provider]  baclofen (LIORESAL) 20 MG tablet Take 20 mg by mouth as needed. 10/14/20   [provider]  cyclobenzaprine (FLEXERIL) 10 MG tablet Take 10 mg by mouth as needed. 10/14/20   [provider]  diclofenac Sodium (VOLTAREN) 1 % GEL Apply 2 g topically as needed. 10/14/20   [provider]  EPINEPHrine 0.3 mg/0.3 mL IJ SOAJ injection Inject 0.3 mg into the muscle once. As needed for allergic reaction    [provider]   ergocalciferol (VITAMIN D2) 1.25 MG (50000 UT) capsule once a week.    [provider]  fludrocortisone (FLORINEF) 0.1 MG tablet Take 100 mcg by mouth daily. 01/23/21   [provider]  gabapentin (NEURONTIN) 300 MG capsule Take by mouth 3 (three) times daily. 10/14/20   [provider]  ipratropium-albuterol (DUONEB) 0.5-2.5 (3) MG/3ML SOLN USE 1 SOLUTION IN NEBULIZER 4 TIMES DAILY AS NEEDED 11/20/17   [provider]  levalbuterol (XOPENEX HFA) 45 MCG/ACT inhaler Inhale 2 puffs into the lungs every 8 (eight) hours as needed for wheezing or shortness of breath.  12/17/16   [provider]  levalbuterol Penne Lash) 1.25 MG/3ML nebulizer solution Take 1.25 mg by nebulization every 8 (eight) hours as needed for wheezing or shortness of breath.  06/06/17   [provider]  nitroGLYCERIN (NITROSTAT) 0.4 MG SL tablet Place 1 tablet (0.4 mg total) under the tongue every 5 (five) minutes as needed for chest pain. 06/17/14   Jerline Pain, MD  NONFORMULARY OR COMPOUNDED ITEM Apply 1 application topically 4 (four) times daily. Katamine/Gabapentin/Baclofen/Lidocaine/Menthol Cream    [provider]  norgestimate-ethinyl estradiol (ORTHO-CYCLEN) 0.25-35 MG-MCG tablet Take 1 tablet by mouth daily.    [provider]  ondansetron (ZOFRAN-ODT) 8 MG disintegrating tablet Take by mouth as needed. 02/12/21   [provider]  predniSONE (DELTASONE) 20 MG tablet Take 60 mg by mouth daily. approx 2-3 days 02/19/21   [provider]  RESTASIS MULTIDOSE 0.05 %  ophthalmic emulsion Place 1 drop into both eyes 2 (two) times daily.  08/09/17   [provider]  SSD 1 % cream Apply topically as needed. 03/23/21   Werner Lean, MD      Allergies    Adhesive [tape], Blue dyes (parenteral), Contrast media [iodinated contrast media], Corn-containing products, Green dyes, Nickel, Onion, Other, Petroleum distillate, Silicone, Bee  venom, Cortisporin [bacitra-neomycin-polymyxin-hc], and Yellow jacket venom    Review of Systems   Review of Systems  All other systems reviewed and are negative.   Physical Exam Updated Vital Signs BP (!) 127/95   Pulse 99   Temp 98.1 F (36.7 C)   Resp 19   Ht '5\' 1"'$  (1.549 m)   Wt 49.9 kg   SpO2 100%   BMI 20.78 kg/m  Physical Exam Vitals and nursing note reviewed.  Constitutional:      General: She is not in acute distress.    Appearance: She is well-developed. She is not diaphoretic.     Comments: Patient is awake and alert.  She appears uncomfortable and is tearful.  HENT:     Head: Normocephalic and atraumatic.  Eyes:     General: No visual field deficit. Cardiovascular:     Rate and Rhythm: Normal rate and regular rhythm.     Heart sounds: No murmur heard.    No friction rub. No gallop.  Pulmonary:     Effort: Pulmonary effort is normal. No respiratory distress.     Breath sounds: Normal breath sounds. No wheezing.  Abdominal:     General: Bowel sounds are normal. There is no distension.     Palpations: Abdomen is soft.     Tenderness: There is no abdominal tenderness.  Musculoskeletal:        General: Normal range of motion.     Cervical back: Normal range of motion and neck supple.  Skin:    General: Skin is warm and dry.  Neurological:     General: No focal deficit present.     Mental Status: She is alert and oriented to person, place, and time.     Cranial Nerves: No cranial nerve deficit, dysarthria or facial asymmetry.     ED Results / Procedures / Treatments   Labs (all labs ordered are listed, but only abnormal results are displayed) Labs Reviewed - No data to display  EKG None  Radiology No results found.  Procedures Procedures    Medications Ordered in ED Medications  sodium chloride 0.9 % bolus 1,000 mL (has no administration in time range)  ketorolac (TORADOL) 30 MG/ML injection 30 mg (has no administration in time range)   dexamethasone (DECADRON) injection 10 mg (has no administration in time range)  diphenhydrAMINE (BENADRYL) injection 25 mg (has no administration in time range)  metoCLOPramide (REGLAN) injection 10 mg (has no administration in time range)    ED Course/ Medical Decision Making/ A&P  Patient presenting with complaints of headache as described in the HPI.  She arrives here neurologically intact with no focal deficits.  She has history of similar episodes, but this 1 is particularly bad.  She was given a migraine cocktail and seems to be feeling much better.  At this point, I feel as though she can safely be discharged.  I will have her follow-up with her neurologist and return as needed.  Final Clinical Impression(s) / ED Diagnoses Final diagnoses:  None    Rx / DC Orders ED Discharge Orders  None         Veryl Speak, MD 09/07/21 [phone]

## 2021-09-07 NOTE — Discharge Instructions (Signed)
Follow-up with your neurologist, and return to the ER if symptoms significantly worsen or change.

## 2021-09-07 NOTE — ED Notes (Signed)
Mom called out to hall stating that patient was wanting IV out because it was hurting. IV removed and catheter intact. Pt states headache has decreased to a 4/10. EKG leads removed. Pt and mother state they are ready for dc. Edp notified of the same.

## 2021-09-07 NOTE — ED Notes (Addendum)
Pov from home with mother. Cc of headache to the back of her head. Started tonight. Pt has a history of headaches and sees a neurologist but nothing they do help them. Pt states usually ice helps but this time it does not. Pt has hx of Autonomic dysfunction, cervical and thoracic spondylosis, and pituitary issues. Says no other medications work. Recently had MRI

## 2021-09-07 NOTE — ED Notes (Signed)
Pt states she will not get the benadryl because she will be feeling the effects of the medication for too long.

## 2021-09-14 DIAGNOSIS — G603 Idiopathic progressive neuropathy: Secondary | ICD-10-CM | POA: Diagnosis not present

## 2021-09-14 DIAGNOSIS — M5416 Radiculopathy, lumbar region: Secondary | ICD-10-CM | POA: Diagnosis not present

## 2021-09-14 DIAGNOSIS — E649 Sequelae of unspecified nutritional deficiency: Secondary | ICD-10-CM | POA: Diagnosis not present

## 2021-09-14 DIAGNOSIS — F4001 Agoraphobia with panic disorder: Secondary | ICD-10-CM | POA: Diagnosis not present

## 2021-09-14 DIAGNOSIS — M4134 Thoracogenic scoliosis, thoracic region: Secondary | ICD-10-CM | POA: Diagnosis not present

## 2021-09-14 DIAGNOSIS — G4489 Other headache syndrome: Secondary | ICD-10-CM | POA: Diagnosis not present

## 2021-09-21 DIAGNOSIS — I10 Essential (primary) hypertension: Secondary | ICD-10-CM | POA: Diagnosis not present

## 2021-09-21 DIAGNOSIS — F4310 Post-traumatic stress disorder, unspecified: Secondary | ICD-10-CM | POA: Diagnosis not present

## 2021-09-21 DIAGNOSIS — R238 Other skin changes: Secondary | ICD-10-CM | POA: Diagnosis not present

## 2021-09-21 DIAGNOSIS — D509 Iron deficiency anemia, unspecified: Secondary | ICD-10-CM | POA: Diagnosis not present

## 2021-09-21 DIAGNOSIS — R5383 Other fatigue: Secondary | ICD-10-CM | POA: Diagnosis not present

## 2021-09-21 DIAGNOSIS — G4489 Other headache syndrome: Secondary | ICD-10-CM | POA: Diagnosis not present

## 2021-11-27 DIAGNOSIS — I951 Orthostatic hypotension: Secondary | ICD-10-CM | POA: Diagnosis not present

## 2021-11-27 DIAGNOSIS — G4489 Other headache syndrome: Secondary | ICD-10-CM | POA: Diagnosis not present

## 2021-11-27 DIAGNOSIS — M5416 Radiculopathy, lumbar region: Secondary | ICD-10-CM | POA: Diagnosis not present

## 2021-11-27 DIAGNOSIS — G603 Idiopathic progressive neuropathy: Secondary | ICD-10-CM | POA: Diagnosis not present

## 2021-11-27 DIAGNOSIS — F4001 Agoraphobia with panic disorder: Secondary | ICD-10-CM | POA: Diagnosis not present

## 2021-11-27 DIAGNOSIS — G909 Disorder of the autonomic nervous system, unspecified: Secondary | ICD-10-CM | POA: Diagnosis not present

## 2021-11-27 DIAGNOSIS — E649 Sequelae of unspecified nutritional deficiency: Secondary | ICD-10-CM | POA: Diagnosis not present

## 2021-11-27 DIAGNOSIS — M4134 Thoracogenic scoliosis, thoracic region: Secondary | ICD-10-CM | POA: Diagnosis not present

## 2022-01-21 DIAGNOSIS — Z6822 Body mass index (BMI) 22.0-22.9, adult: Secondary | ICD-10-CM | POA: Diagnosis not present

## 2022-01-21 DIAGNOSIS — T7840XD Allergy, unspecified, subsequent encounter: Secondary | ICD-10-CM | POA: Diagnosis not present

## 2022-01-21 DIAGNOSIS — M419 Scoliosis, unspecified: Secondary | ICD-10-CM | POA: Diagnosis not present

## 2022-05-12 ENCOUNTER — Emergency Department (HOSPITAL_COMMUNITY): Payer: Medicare Other

## 2022-05-12 ENCOUNTER — Other Ambulatory Visit: Payer: Self-pay

## 2022-05-12 ENCOUNTER — Encounter (HOSPITAL_COMMUNITY): Payer: Self-pay

## 2022-05-12 ENCOUNTER — Emergency Department (HOSPITAL_COMMUNITY)
Admission: EM | Admit: 2022-05-12 | Discharge: 2022-05-12 | Disposition: A | Discharge status: Home / Self Care | Payer: Medicare Other | Attending: Student | Admitting: Student

## 2022-05-12 DIAGNOSIS — W228XXA Striking against or struck by other objects, initial encounter: Secondary | ICD-10-CM | POA: Insufficient documentation

## 2022-05-12 DIAGNOSIS — M79641 Pain in right hand: Secondary | ICD-10-CM | POA: Diagnosis not present

## 2022-05-12 DIAGNOSIS — S6991XA Unspecified injury of right wrist, hand and finger(s), initial encounter: Secondary | ICD-10-CM | POA: Diagnosis not present

## 2022-05-12 DIAGNOSIS — S60511A Abrasion of right hand, initial encounter: Secondary | ICD-10-CM | POA: Diagnosis not present

## 2022-05-12 NOTE — ED Notes (Signed)
R wrist splint applied, pt did not tolerate well. +PMS distal to splint.  Put states she is unable to sign form. Noted on [employer] form.

## 2022-05-12 NOTE — ED Triage Notes (Signed)
Pt reports she closed her right hand in the chicken coop door this evening and has bruising, and pain.

## 2022-05-12 NOTE — Discharge Instructions (Signed)
You were evaluated today for right hand and wrist pain.  Your x-rays were negative for fractures or dislocation.  The brace is for comfort.  You may remove it you find it is not helpful or cumbersome.  Please contact hand surgery at the number included schedule follow-up appointment for further evaluation and management.

## 2022-05-12 NOTE — ED Provider Notes (Signed)
[facility] Provider Note   CSN: [id number] Arrival date & time: 05/12/22  2036     History  Chief Complaint  Patient presents with   Hand Injury    Traci Hensley is a 32 y.o. female.  Patient presents emergency department complaining of right hand pain secondary to shutting her hand in a door.  She states after shutting her hand in the door she noticed an abrasion at the knuckle of the fourth MCP joint on the posterior side of the hand, she washed her hand thoroughly and then noticed that it started to bruise.  She states that while driving here she began to feel that her hand was having a hard time moving due to pain.  Past medical history includes bipolar 1 disorder, anxiety, chronic chest pain, chronic abdominal pain, agoraphobia, depression, PTSD, multiple allergies  HPI     Home Medications Prior to Admission medications   Medication Sig Start Date End Date Taking? Authorizing Provider  ADVAIR DISKUS 500-50 MCG/DOSE AEPB Inhale 1 puff into the lungs 2 (two) times daily as needed (for shortness of breath).  07/25/14   [provider]  albuterol (PROVENTIL HFA;VENTOLIN HFA) 108 (90 Base) MCG/ACT inhaler Inhale into the lungs every 6 (six) hours as needed for wheezing or shortness of breath.    [provider]  amoxicillin-clavulanate (AUGMENTIN) 875-125 MG tablet daily. 02/12/21   [provider]  baclofen (LIORESAL) 20 MG tablet Take 20 mg by mouth as needed. 10/14/20   [provider]  cyclobenzaprine (FLEXERIL) 10 MG tablet Take 10 mg by mouth as needed. 10/14/20   [provider]  diclofenac Sodium (VOLTAREN) 1 % GEL Apply 2 g topically as needed. 10/14/20   [provider]  EPINEPHrine 0.3 mg/0.3 mL IJ SOAJ injection Inject 0.3 mg into the muscle once. As needed for allergic reaction    [provider]  ergocalciferol (VITAMIN D2) 1.25 MG (50000 UT) capsule once a week.     [provider]  fludrocortisone (FLORINEF) 0.1 MG tablet Take 100 mcg by mouth daily. 01/23/21   [provider]  gabapentin (NEURONTIN) 300 MG capsule Take by mouth 3 (three) times daily. 10/14/20   [provider]  ipratropium-albuterol (DUONEB) 0.5-2.5 (3) MG/3ML SOLN USE 1 SOLUTION IN NEBULIZER 4 TIMES DAILY AS NEEDED 11/20/17   [provider]  levalbuterol (XOPENEX HFA) 45 MCG/ACT inhaler Inhale 2 puffs into the lungs every 8 (eight) hours as needed for wheezing or shortness of breath.  12/17/16   [provider]  levalbuterol Penne Lash) 1.25 MG/3ML nebulizer solution Take 1.25 mg by nebulization every 8 (eight) hours as needed for wheezing or shortness of breath.  06/06/17   [provider]  nitroGLYCERIN (NITROSTAT) 0.4 MG SL tablet Place 1 tablet (0.4 mg total) under the tongue every 5 (five) minutes as needed for chest pain. 06/17/14   Jerline Pain, MD  NONFORMULARY OR COMPOUNDED ITEM Apply 1 application topically 4 (four) times daily. Katamine/Gabapentin/Baclofen/Lidocaine/Menthol Cream    [provider]  norgestimate-ethinyl estradiol (ORTHO-CYCLEN) 0.25-35 MG-MCG tablet Take 1 tablet by mouth daily.    [provider]  ondansetron (ZOFRAN-ODT) 8 MG disintegrating tablet Take by mouth as needed. 02/12/21   [provider]  predniSONE (DELTASONE) 20 MG tablet Take 60 mg by mouth daily. approx 2-3 days 02/19/21   [provider]  RESTASIS MULTIDOSE 0.05 % ophthalmic emulsion Place 1 drop into both eyes 2 (two) times  daily.  08/09/17   [provider]  SSD 1 % cream Apply topically as needed. 03/23/21   Werner Lean, MD      Allergies    Adhesive [tape], Blue dyes (parenteral), Contrast media [iodinated contrast media], Corn-containing products, Green dyes, Nickel, Onion, Other, Petroleum distillate, Silicone, Bee venom, Cortisporin [bacitra-neomycin-polymyxin-hc], and Yellow jacket venom     Review of Systems   Review of Systems  Musculoskeletal:  Positive for arthralgias.    Physical Exam Updated Vital Signs BP (!) 150/88   Pulse 96   Temp 98.1 F (36.7 C) (Oral)   Resp 18   Ht 5' (1.524 m)   Wt 49.9 kg   SpO2 100%   BMI 21.48 kg/m  Physical Exam HENT:     Head: Normocephalic and atraumatic.  Eyes:     Pupils: Pupils are equal, round, and reactive to light.  Pulmonary:     Effort: Pulmonary effort is normal. No respiratory distress.  Musculoskeletal:        General: Tenderness and signs of injury present. No swelling or deformity.     Cervical back: Normal range of motion.     Comments: Patient had normal range of motion upon arrival of the right hand, now complaining of pain limiting range of motion  Skin:    General: Skin is dry.     Comments: Abrasion noted to the posterior right hand at the fourth MCP joint  Neurological:     Mental Status: She is alert.  Psychiatric:        Speech: Speech normal.        Behavior: Behavior normal.     ED Results / Procedures / Treatments   Labs (all labs ordered are listed, but only abnormal results are displayed) Labs Reviewed - No data to display  EKG None  Radiology DG Wrist Complete Right  Result Date: 05/12/2022 CLINICAL DATA:  Blunt trauma to the wrist, initial encounter EXAM: RIGHT WRIST - COMPLETE 3+ VIEW COMPARISON:  None Available. FINDINGS: There is no evidence of fracture or dislocation. There is no evidence of arthropathy or other focal bone abnormality. Soft tissues are unremarkable. IMPRESSION: No acute abnormality noted. Electronically Signed   By: Inez Catalina M.D.   On: 05/12/2022 21:20   DG Hand Complete Right  Result Date: 05/12/2022 CLINICAL DATA:  Blunt trauma to the hand with pain, initial encounter EXAM: RIGHT HAND - COMPLETE 3+ VIEW COMPARISON:  None Available. FINDINGS: There is no evidence of fracture or dislocation. There is no evidence of arthropathy or other focal bone  abnormality. Soft tissues are unremarkable. IMPRESSION: No acute abnormality noted. Electronically Signed   By: Inez Catalina M.D.   On: 05/12/2022 21:20    Procedures Procedures    Medications Ordered in ED Medications - No data to display  ED Course/ Medical Decision Making/ A&P                             Medical Decision Making Amount and/or Complexity of Data Reviewed Radiology: ordered.   Patient presents complaining of right hand/wrist pain.  Differential diagnosis includes fracture, dislocation, soft tissue injury, and others  The patient is comorbidities including multiple allergies, chronic pain, myelopathy  The patient has no recent relevant medical visits on file for review  I ordered and interpreted imaging including plain films of the right hand and wrist.  No acute fracture or dislocation was noted.  I agree  with the radiologist findings  I see no acute bony abnormalities on imaging.  Patient refuses any pain medications that she states that oral pain medications do not work.  She states the only topical medications work.  Patient also refuses a tetanus booster as she states that she is allergic and they have caused paralysis in the past.  As he medication further emergent workup at this time.  Plan to place patient in a wrist brace with plans for follow-up with hand surgery.       Final Clinical Impression(s) / ED Diagnoses Final diagnoses:  Right hand pain    Rx / DC Orders ED Discharge Orders     None         Ronny Bacon 05/12/22 2209    Teressa Lower, MD 05/13/22 [phone]

## 2022-05-16 ENCOUNTER — Telehealth: Payer: Self-pay

## 2022-05-16 NOTE — Telephone Encounter (Signed)
        Patient  visited [facility] on 05/12/2022  for right hand pain.   Telephone encounter attempt :  1st  No answer/busy unable to leave message.   [facility] Resource Care Guide   ??[email or web address]  ?? [phone]   Website: [email or web address]  [email or web address]

## 2022-05-17 DIAGNOSIS — E538 Deficiency of other specified B group vitamins: Secondary | ICD-10-CM | POA: Diagnosis not present

## 2022-05-17 DIAGNOSIS — Z3009 Encounter for other general counseling and advice on contraception: Secondary | ICD-10-CM | POA: Diagnosis not present

## 2022-05-17 DIAGNOSIS — G8929 Other chronic pain: Secondary | ICD-10-CM | POA: Diagnosis not present

## 2022-05-17 DIAGNOSIS — E559 Vitamin D deficiency, unspecified: Secondary | ICD-10-CM | POA: Diagnosis not present

## 2022-05-17 DIAGNOSIS — F4310 Post-traumatic stress disorder, unspecified: Secondary | ICD-10-CM | POA: Diagnosis not present

## 2022-05-17 DIAGNOSIS — Z6821 Body mass index (BMI) 21.0-21.9, adult: Secondary | ICD-10-CM | POA: Diagnosis not present

## 2022-05-17 DIAGNOSIS — R233 Spontaneous ecchymoses: Secondary | ICD-10-CM | POA: Diagnosis not present

## 2022-05-17 DIAGNOSIS — G43009 Migraine without aura, not intractable, without status migrainosus: Secondary | ICD-10-CM | POA: Diagnosis not present

## 2022-05-17 DIAGNOSIS — I10 Essential (primary) hypertension: Secondary | ICD-10-CM | POA: Diagnosis not present

## 2022-05-17 DIAGNOSIS — R002 Palpitations: Secondary | ICD-10-CM | POA: Diagnosis not present

## 2022-05-17 DIAGNOSIS — R5383 Other fatigue: Secondary | ICD-10-CM | POA: Diagnosis not present

## 2022-05-21 ENCOUNTER — Telehealth: Payer: Self-pay

## 2022-05-21 NOTE — Telephone Encounter (Signed)
        Patient  visited [facility] on 05/11/2022  for right hand pain.   Telephone encounter attempt :  2nd  A HIPAA compliant voice message was left requesting a return call.  Instructed patient to call back at No answer/busy unable to leave message.   [facility] Resource Care Guide   [email or web address]  ?? [phone]   Website: [email or web address]  [email or web address]

## 2022-08-19 DIAGNOSIS — R002 Palpitations: Secondary | ICD-10-CM | POA: Diagnosis not present

## 2022-08-19 DIAGNOSIS — E559 Vitamin D deficiency, unspecified: Secondary | ICD-10-CM | POA: Diagnosis not present

## 2022-08-19 DIAGNOSIS — Z6822 Body mass index (BMI) 22.0-22.9, adult: Secondary | ICD-10-CM | POA: Diagnosis not present

## 2022-08-19 DIAGNOSIS — E538 Deficiency of other specified B group vitamins: Secondary | ICD-10-CM | POA: Diagnosis not present

## 2022-08-19 DIAGNOSIS — J45909 Unspecified asthma, uncomplicated: Secondary | ICD-10-CM | POA: Diagnosis not present

## 2022-08-19 DIAGNOSIS — R1030 Lower abdominal pain, unspecified: Secondary | ICD-10-CM | POA: Diagnosis not present

## 2022-08-20 ENCOUNTER — Other Ambulatory Visit: Payer: Self-pay | Admitting: Family Medicine

## 2022-08-20 DIAGNOSIS — R1030 Lower abdominal pain, unspecified: Secondary | ICD-10-CM

## 2022-08-22 ENCOUNTER — Telehealth: Payer: Self-pay | Admitting: Internal Medicine

## 2022-08-22 NOTE — Telephone Encounter (Signed)
Received stat call from operator. Spoke with caregiver MariJo who reports patient was out watering and feeding her chickens and noted her HR was 27 on her pulse ox meter. MariJo reports patient has been feeling tired, sluggish and has increased SOB using her oxygen more.   Advised that patient go to ED for immediate evaluation. MariJo refused and states patient refuses to go to ED because they always dismiss her and don't treat her issues. MariJo states patient saw PCP on 08/19/2022 and was told they are concerned about her "stroking out" and to make an appointment with her primary cardiologist ASAP.  Encouraged ED, again refused, MariJo states she is just calling to get patient an appt with cardiologist ASAP and it needs to be with Dr. Izora Ribas.  Offered next available appt with Dr. Izora Ribas on 08/27/22 at 3:20pm, MariJo accepted appt. Requested office visit note from PCP's office (to be faxed to our office today).  Placed patient on waitlist in case there are any cancellations between now and next Tuesday. Will forward to Dr. Debby Bud nurse.

## 2022-08-22 NOTE — Telephone Encounter (Signed)
Called Maryjo to f/u.  Reports pt will not be going to the ED for bradycardia.   Reports pt going in and out of consciousness.   Advised pt needs to go to the ED with theses symptoms.   Maryjo reports pt has a no ambulance clause in advance directives d/t allergies and she can't ride in ambulance with pt.  Reports pt HR down to 27 while up and moving and that something is wrong.  PCP office is going to fax over last OV notes for MD to review. Reviewed ED precautions again with pt and Maryjo.  They report no one takes pt symptoms seriously and she will not be going.   Reviewed next scheduled OV date and time and ended call.

## 2022-08-22 NOTE — Telephone Encounter (Signed)
STAT if HR is under 50 or over 120 (normal HR is 60-100 beats per minute)  What is your heart rate? 27  Do you have a log of your heart rate readings (document readings)? Yes   Do you have any other symptoms? Really fatigued, Bradycardia

## 2022-08-27 ENCOUNTER — Ambulatory Visit (INDEPENDENT_AMBULATORY_CARE_PROVIDER_SITE_OTHER): Payer: Medicare Other

## 2022-08-27 ENCOUNTER — Encounter: Payer: Self-pay | Admitting: Internal Medicine

## 2022-08-27 ENCOUNTER — Ambulatory Visit: Payer: Medicare Other | Attending: Internal Medicine | Admitting: Internal Medicine

## 2022-08-27 VITALS — BP 128/78 | HR 82 | Ht 60.0 in | Wt 115.4 lb

## 2022-08-27 DIAGNOSIS — R002 Palpitations: Secondary | ICD-10-CM

## 2022-08-27 DIAGNOSIS — R5383 Other fatigue: Secondary | ICD-10-CM | POA: Insufficient documentation

## 2022-08-27 DIAGNOSIS — R001 Bradycardia, unspecified: Secondary | ICD-10-CM | POA: Insufficient documentation

## 2022-08-27 DIAGNOSIS — G909 Disorder of the autonomic nervous system, unspecified: Secondary | ICD-10-CM | POA: Diagnosis not present

## 2022-08-27 NOTE — Progress Notes (Signed)
Cardiology Office Note:    Date:  08/27/2022   ID:  [name], DOB 1990/12/15, MRN [id number]  PCP:  Sedonia Small, MD   [facility] Providers Cardiologist:  Christell Constant, MD     Referring MD: Sedonia Small, MD   CC: Follow up CP Has had prior cardiology (prior Malen Gauze)  History of Present Illness:    Traci Hensley is a 32 y.o. female with a hx of PTSD with query of inappropriate sinus tachycardia vs POTS scoliosis who presents 11/09/20.  In interim of this visit, we were unable to perform Stress MRI at our facility (see phone notes for more details).  Seen 02/20/21. 2024: Did not elect to go to [facility] (was unable to go for repeat testing prior).  New low heart rates.  Patient notes that she is doing poorly.   Since last visit notes new symptoms .  She has has two paroxysms of low heart rate.  They are associated with irregular beats.  They are associated with brain fog and abdominal pain.  She was told that she has a distal aortic narrowing (I did not see evidence of coarctation on her abdominal CT that was done prio, but this feels different.  She has worsening symptoms when she rolls of her right side. These symptoms are new from her prior chest pain   Past Medical History:  Diagnosis Date   Agoraphobia    Anxiety    Bipolar 1 disorder (HCC)    Chronic abdominal pain    Chronic chest pain    Depression    PTSD (post-traumatic stress disorder)    Scoliosis     Past Surgical History:  Procedure Laterality Date   MOUTH SURGERY      Current Medications: Current Meds  Medication Sig   albuterol (PROVENTIL HFA;VENTOLIN HFA) 108 (90 Base) MCG/ACT inhaler Inhale into the lungs every 6 (six) hours as needed for wheezing or shortness of breath.   baclofen (LIORESAL) 20 MG tablet Take 20 mg by mouth as needed.   cyclobenzaprine (FLEXERIL) 10 MG tablet Take 10 mg by mouth as needed.    EPINEPHrine 0.3 mg/0.3 mL IJ SOAJ injection Inject 0.3 mg into the muscle once. As needed for allergic reaction   ergocalciferol (VITAMIN D2) 1.25 MG (50000 UT) capsule once a week.   fludrocortisone (FLORINEF) 0.1 MG tablet Take 100 mcg by mouth daily.   gabapentin (NEURONTIN) 300 MG capsule Take by mouth 3 (three) times daily.   ipratropium-albuterol (DUONEB) 0.5-2.5 (3) MG/3ML SOLN USE 1 SOLUTION IN NEBULIZER 4 TIMES DAILY AS NEEDED   levalbuterol (XOPENEX HFA) 45 MCG/ACT inhaler Inhale 2 puffs into the lungs every 8 (eight) hours as needed for wheezing or shortness of breath.    levalbuterol (XOPENEX) 1.25 MG/3ML nebulizer solution Take 1.25 mg by nebulization every 8 (eight) hours as needed for wheezing or shortness of breath.    meloxicam (MOBIC) 15 MG tablet Take 15 mg by mouth daily.   montelukast (SINGULAIR) 10 MG tablet daily.   nitroGLYCERIN (NITROSTAT) 0.4 MG SL tablet Place 1 tablet (0.4 mg total) under the tongue every 5 (five) minutes as needed for chest pain.   NONFORMULARY OR COMPOUNDED ITEM Apply 1 application topically 4 (four) times daily. Katamine/Gabapentin/Baclofen/Lidocaine/Menthol Cream   NORTREL 0.5/35, 28, 0.5-35 MG-MCG tablet Take 1 tablet by mouth daily.   ondansetron (ZOFRAN-ODT) 8 MG disintegrating tablet Take by mouth as needed.   RESTASIS MULTIDOSE 0.05 % ophthalmic emulsion  Place 1 drop into both eyes 2 (two) times daily.    SSD 1 % cream Apply topically as needed.   [DISCONTINUED] ADVAIR DISKUS 500-50 MCG/DOSE AEPB Inhale 1 puff into the lungs 2 (two) times daily as needed (for shortness of breath).    [DISCONTINUED] amoxicillin-clavulanate (AUGMENTIN) 875-125 MG tablet daily.   [DISCONTINUED] diclofenac Sodium (VOLTAREN) 1 % GEL Apply 2 g topically as needed.   [DISCONTINUED] norgestimate-ethinyl estradiol (ORTHO-CYCLEN) 0.25-35 MG-MCG tablet Take 1 tablet by mouth daily.   [DISCONTINUED] predniSONE (DELTASONE) 20 MG tablet Take 60 mg by mouth daily. approx 2-3  days     Allergies:   Adhesive [tape], Blue dyes (parenteral), Contrast media [iodinated contrast media], Corn-containing products, Green dyes, Nickel, Onion, Other, Petroleum distillate, Silicone, Bee venom, Cortisporin [bacitra-neomycin-polymyxin-hc], and Yellow jacket venom   Social History   Socioeconomic History   Marital status: Single    Spouse name: Not on file   Number of children: Not on file   Years of education: Not on file   Highest education level: Not on file  Occupational History   Not on file  Tobacco Use   Smoking status: Some Days    Packs/day: 0.01    Years: 2.00    Additional pack years: 0.00    Total pack years: 0.02    Types: Cigarettes    Last attempt to quit: 02/14/2014    Years since quitting: 8.5   Smokeless tobacco: Never   Tobacco comments:    rare, social smoker  Vaping Use   Vaping Use: Former  Substance and Sexual Activity   Alcohol use: Yes    Alcohol/week: 0.0 standard drinks of alcohol    Comment: occ   Drug use: No   Sexual activity: Not on file  Other Topics Concern   Not on file  Social History Narrative   Not on file   Social Determinants of Health   Financial Resource Strain: Not on file  Food Insecurity: Not on file  Transportation Needs: Not on file  Physical Activity: Not on file  Stress: Not on file  Social Connections: Not on file    Social: Comes to most visits with her health care POA  Family History: The patient's family history includes Arrhythmia in her mother. There is no history of Colon cancer.  ROS:   Please see the history of present illness.     EKGs/Labs/Other Studies Reviewed:    The following studies were reviewed today:  EKG:   11/09/20: SR 75   Cardiac Studies & Procedures     STRESS TESTS  MYOCARDIAL PERFUSION IMAGING 02/27/2021  Narrative   The study is normal. The study is low risk.   No ST deviation was noted.   LV perfusion is normal. There is no evidence of ischemia. There is no  evidence of infarction.   Left ventricular function is normal. Nuclear stress EF: 73 %. The left ventricular ejection fraction is hyperdynamic (>65%). End diastolic cavity size is normal. End systolic cavity size is normal.   Prior study not available for comparison.   ECHOCARDIOGRAM  ECHOCARDIOGRAM COMPLETE 12/11/2016  Narrative *[facility]* [address] [place], [place] [address] [phone]  ------------------------------------------------------------------- Transthoracic Echocardiography  Patient:    Traci Hensley, Traci Hensley MR #:       [id number] Study Date: 12/11/2016 Gender:     F Age:        25 Height:     154.9 cm Weight:     53.9  kg BSA:        1.53 m^2 Pt. Status: Room:  ATTENDING    [place]     Joni Reining M REFERRING    Jodelle Gross SONOGRAPHER  Jeryl Columbia PERFORMING   [facility], Pattricia Boss [facility]  cc:  ------------------------------------------------------------------- LV EF: 60% -   65%  ------------------------------------------------------------------- Indications:      Palpitations 785.1.  ------------------------------------------------------------------- History:   PMH:  Palpitations, Former Smoker, Sinus Tachycardia, PTSD, Bipolar.  Angina pectoris.  ------------------------------------------------------------------- Study Conclusions  - Left ventricle: The cavity size was normal. Wall thickness was normal. Systolic function was normal. The estimated ejection fraction was in the range of 60% to 65%. Wall motion was normal; there were no regional wall motion abnormalities. Left ventricular diastolic function parameters were normal.  ------------------------------------------------------------------- Study data:  Comparison was made to the study of 04/29/2013.  Study status:  Routine.  Procedure:  Transthoracic echocardiography. Image quality was adequate.          Transthoracic echocardiography.  M-mode,  complete 2D, spectral Doppler, and color Doppler.  Birthdate:  Patient birthdate: [birth year].  Age:  Patient is 32 yr old.  Sex:  Gender: female.    BMI: 22.5 kg/m^2.  Patient status:  Outpatient.  Study date:  Study date: 12/11/2016. Study time: 08:36 AM.  Location:  Bedside.  -------------------------------------------------------------------  ------------------------------------------------------------------- Left ventricle:  The cavity size was normal. Wall thickness was normal. Systolic function was normal. The estimated ejection fraction was in the range of 60% to 65%. Wall motion was normal; there were no regional wall motion abnormalities. The transmitral flow pattern was normal. The deceleration time of the early transmitral flow velocity was normal. The pulmonary vein flow pattern was normal. The tissue Doppler parameters were normal. Left ventricular diastolic function parameters were normal.  ------------------------------------------------------------------- Aortic valve:   Trileaflet.  Doppler:   There was no stenosis. There was no regurgitation.  ------------------------------------------------------------------- Aorta:  Aortic root: The aortic root was normal in size.  ------------------------------------------------------------------- Mitral valve:   Structurally normal valve.   Leaflet separation was normal.  Doppler:  Transvalvular velocity was within the normal range. There was no evidence for stenosis. There was trivial regurgitation.    Peak gradient (D): 4 mm Hg.  ------------------------------------------------------------------- Left atrium:  The atrium was normal in size.  ------------------------------------------------------------------- Atrial septum:  No defect or patent foramen ovale was identified.  ------------------------------------------------------------------- Right ventricle:  The cavity size was normal. Wall thickness was normal. Systolic  function was normal.  ------------------------------------------------------------------- Pulmonic valve:    The valve appears to be grossly normal. Doppler:  There was no significant regurgitation.  ------------------------------------------------------------------- Tricuspid valve:   Structurally normal valve.   Leaflet separation was normal.  Doppler:  Transvalvular velocity was within the normal range. There was no significant regurgitation.  ------------------------------------------------------------------- Right atrium:  The atrium was normal in size.  ------------------------------------------------------------------- Pericardium:  There was no pericardial effusion.  ------------------------------------------------------------------- Systemic veins: Inferior vena cava: The vessel was normal in size. The respirophasic diameter changes were in the normal range (>= 50%), consistent with normal central venous pressure.  ------------------------------------------------------------------- Measurements  Left ventricle                         Value        Reference LV ID, ED, PLAX chordal        (L)     36.4  mm     43 - 52 LV ID, ES, PLAX chordal        (  L)     21.8  mm     23 - 38 LV fx shortening, PLAX chordal         40    %      >=29 LV PW thickness, ED                    6.72  mm     ---------- IVS/LV PW ratio, ED                    1.13         <=1.3 LV e&', lateral                         13.9  cm/s   ---------- LV E/e&', lateral                       7.15         ---------- LV e&', medial                          13.8  cm/s   ---------- LV E/e&', medial                        7.2          ---------- LV e&', average                         13.85 cm/s   ---------- LV E/e&', average                       7.18         ----------  Ventricular septum                     Value        Reference IVS thickness, ED                      7.57  mm     ----------  LVOT                                    Value        Reference LVOT ID, S                             19    mm     ---------- LVOT area                              2.84  cm^2   ----------  Aorta                                  Value        Reference Aortic root ID, ED                     22    mm     ----------  Left atrium  Value        Reference LA ID, A-P, ES                         22    mm     ---------- LA ID/bsa, A-P                         1.44  cm/m^2 <=2.2 LA volume, S                           23.3  ml     ---------- LA volume/bsa, S                       15.2  ml/m^2 ---------- LA volume, ES, 1-p A4C                 18.7  ml     ---------- LA volume/bsa, ES, 1-p A4C             12.2  ml/m^2 ---------- LA volume, ES, 1-p A2C                 27.5  ml     ---------- LA volume/bsa, ES, 1-p A2C             18    ml/m^2 ----------  Mitral valve                           Value        Reference Mitral E-wave peak velocity            99.4  cm/s   ---------- Mitral A-wave peak velocity            61.1  cm/s   ---------- Mitral deceleration time               176   ms     150 - 230 Mitral peak gradient, D                4     mm Hg  ---------- Mitral E/A ratio, peak                 1.6          ----------  Right atrium                           Value        Reference RA ID, S-I, ES, A4C            (L)     31.1  mm     34 - 49 RA area, ES, A4C               (L)     5.89  cm^2   8.3 - 19.5 RA volume, ES, A/L                     9.3   ml     ---------- RA volume/bsa, ES, A/L                 6.1   ml/m^2 ----------  Systemic veins                         Value  Reference Estimated CVP                          3     mm Hg  ----------  Right ventricle                        Value        Reference TAPSE                                  21.5  mm     ---------- RV s&', lateral, S                      17    cm/s   ----------  Legend: (L)  and  (H)  mark values outside  specified reference range.  ------------------------------------------------------------------- Prepared and Electronically Authenticated by  Prentice Docker, MD 2018-10-03T11:49:53    MONITORS  LONG TERM MONITOR (3-14 DAYS) 03/22/2021  Narrative  Patient had a minimum heart rate of 46 bpm, maximum heart rate of 169 bpm, and average heart rate of 75 bpm.  Predominant underlying rhythm was sinus rhythm.  Computer reads 5 beats NSVT: appears to be PVC: only one wide complex beat. PVC and PAC triplet.  Isolated PACs were rare (<1.0%).  Isolated PVCs were rare (<1.0%).  Triggered and diary events associated with sinus rhythm, sinus tachycardia, PAC, and PVC.  No malignant arrhythmias.              Recent Labs: No results found for requested labs within last 365 days.  Recent Lipid Panel    Component Value Date/Time   CHOL 193 04/13/2020 0000   TRIG 75 04/13/2020 0000   HDL 55 04/13/2020 0000   LDLCALC 124 04/13/2020 0000       Physical Exam:    VS:  BP 128/78   Pulse 82   Ht 5' (1.524 m)   Wt 115 lb 6.4 oz (52.3 kg)   SpO2 99%   BMI 22.54 kg/m     Wt Readings from Last 3 Encounters:  08/27/22 115 lb 6.4 oz (52.3 kg)  05/12/22 110 lb (49.9 kg)  09/07/21 110 lb (49.9 kg)    Gen: moderate distress  Neck: No JVD Cardiac: No Rubs or Gallops, no Murmur, regular tachycardia cardia, +2 radial pulses Respiratory: Clear to auscultation bilaterally, normal effort, normal  respiratory rate GI: Soft, nontender, non-distended  MS: No  edema;  moves all extremities Integument: Skin feels warm Neuro:  At time of evaluation, alert and oriented to person/place/time/situation; tremulous  Psych: Anxious affect  ASSESSMENT:    1. Palpitations   2. Bradycardia   3. Fatigue, unspecified type   4. Autonomic dysfunction     PLAN:    Fatigue Palpitations  Bradycardia - I do not suspect cardiac etiology, but will get heart monitor and TSH to f/u cardiac  condition  Autonomic instability - would recommend   Concern for POTS  and autonomic dysfunction (she has been referred to [facility] and was not able to go) - I do think she would be best served by seeing a specialist for this disease; we could also see the fine folks at [facility]  PRN based on testing     Medication Adjustments/Labs and Tests Ordered: Current medicines are reviewed at length with the patient today.  Concerns regarding medicines are  outlined above.  Orders Placed This Encounter  Procedures   TSH   LONG TERM MONITOR (3-14 DAYS)   EKG 12-Lead    No orders of the defined types were placed in this encounter.    Patient Instructions  Medication Instructions:  Your physician recommends that you continue on your current medications as directed. Please refer to the Current Medication list given to you today.  *If you need a refill on your cardiac medications before your next appointment, please call your pharmacy*   Lab Work: TSH  If you have labs (blood work) drawn today and your tests are completely normal, you will receive your results only by: MyChart Message (if you have MyChart) OR A paper copy in the mail If you have any lab test that is abnormal or we need to change your treatment, we will call you to review the results.   Testing/Procedures: Your physician has requested that you wear a heart monitor.    Follow-Up:As needed At [facility], you and your health needs are our priority.  As part of our continuing mission to provide you with exceptional heart care, we have created designated Provider Care Teams.  These Care Teams include your primary Cardiologist (physician) and Advanced Practice Providers (APPs -  Physician Assistants and Nurse Practitioners) who all work together to provide you with the care you need, when you need it.   Provider:   Christell Constant, MD     Other Instructions Christena Deem- Long Term Monitor  Instructions  Your physician has requested you wear a ZIO patch monitor for 14 days.  This is a single patch monitor. [employer] supplies one patch monitor per enrollment. Additional stickers are not available. Please do not apply patch if you will be having a Nuclear Stress Test,   Cardiac CT, MRI, or Chest Xray during the period you would be wearing the  monitor. The patch cannot be worn during these tests. You cannot remove and re-apply the  ZIO XT patch monitor.  Your ZIO patch monitor will be mailed 3 day USPS to your address on file. It may take 3-5 days  to receive your monitor after you have been enrolled.  Once you have received your monitor, please review the enclosed instructions. Your monitor  has already been registered assigning a specific monitor serial # to you.  Billing and Patient Assistance Program Information  We have supplied [employer] with any of your insurance information on file for billing purposes. [employer] offers a sliding scale Patient Assistance Program for patients that do not have  insurance, or whose insurance does not completely cover the cost of the ZIO monitor.  You must apply for the Patient Assistance Program to qualify for this discounted rate.  To apply, please call [employer] at [phone], select option 4, select option 2, ask to apply for  Patient Assistance Program. Meredeth Ide will ask your household income, and how many people  are in your household. They will quote your out-of-pocket cost based on that information.  [employer] will also be able to set up a 79-month, interest-free payment plan if needed.  Applying the monitor   Shave hair from upper left chest.  Hold abrader disc by orange tab. Rub abrader in 40 strokes over the upper left chest as  indicated in your monitor instructions.  Clean area with 4 enclosed alcohol pads. Let dry.  Apply patch as indicated in monitor instructions. Patch will be placed under collarbone on left  side of chest with  arrow pointing upward.  Rub patch adhesive wings for 2 minutes. Remove white label marked "1". Remove the white  label marked "2". Rub patch adhesive wings for 2 additional minutes.  While looking in a mirror, press and release button in center of patch. A small green light will  flash 3-4 times. This will be your only indicator that the monitor has been turned on.  Do not shower for the first 24 hours. You may shower after the first 24 hours.  Press the button if you feel a symptom. You will hear a small click. Record Date, Time and  Symptom in the Patient Logbook.  When you are ready to remove the patch, follow instructions on the last 2 pages of Patient  Logbook. Stick patch monitor onto the last page of Patient Logbook.  Place Patient Logbook in the blue and white box. Use locking tab on box and tape box closed  securely. The blue and white box has prepaid postage on it. Please place it in the mailbox as  soon as possible. Your physician should have your test results approximately 7 days after the  monitor has been mailed back to [facility].  Call [facility] Customer Care at [phone] if you have questions regarding  your ZIO XT patch monitor. Call them immediately if you see an orange light blinking on your  monitor.  If your monitor falls off in less than 4 days, contact our Monitor department at [phone].  If your monitor becomes loose or falls off after 4 days call [employer] at [phone] for  suggestions on securing your monitor     Signed, Christell Constant, MD  08/27/2022 4:42 PM    [facility]

## 2022-08-27 NOTE — Patient Instructions (Signed)
Medication Instructions:  Your physician recommends that you continue on your current medications as directed. Please refer to the Current Medication list given to you today.  *If you need a refill on your cardiac medications before your next appointment, please call your pharmacy*   Lab Work: TSH  If you have labs (blood work) drawn today and your tests are completely normal, you will receive your results only by: MyChart Message (if you have MyChart) OR A paper copy in the mail If you have any lab test that is abnormal or we need to change your treatment, we will call you to review the results.   Testing/Procedures: Your physician has requested that you wear a heart monitor.    Follow-Up:As needed At [facility], you and your health needs are our priority.  As part of our continuing mission to provide you with exceptional heart care, we have created designated Provider Care Teams.  These Care Teams include your primary Cardiologist (physician) and Advanced Practice Providers (APPs -  Physician Assistants and Nurse Practitioners) who all work together to provide you with the care you need, when you need it.   Provider:   Christell Constant, MD     Other Instructions Traci Hensley- Long Term Monitor Instructions  Your physician has requested you wear a ZIO patch monitor for 14 days.  This is a single patch monitor. [employer] supplies one patch monitor per enrollment. Additional stickers are not available. Please do not apply patch if you will be having a Nuclear Stress Test,   Cardiac CT, MRI, or Chest Xray during the period you would be wearing the  monitor. The patch cannot be worn during these tests. You cannot remove and re-apply the  ZIO XT patch monitor.  Your ZIO patch monitor will be mailed 3 day USPS to your address on file. It may take 3-5 days  to receive your monitor after you have been enrolled.  Once you have received your monitor, please review the enclosed  instructions. Your monitor  has already been registered assigning a specific monitor serial # to you.  Billing and Patient Assistance Program Information  We have supplied [employer] with any of your insurance information on file for billing purposes. [employer] offers a sliding scale Patient Assistance Program for patients that do not have  insurance, or whose insurance does not completely cover the cost of the ZIO monitor.  You must apply for the Patient Assistance Program to qualify for this discounted rate.  To apply, please call [employer] at [phone], select option 4, select option 2, ask to apply for  Patient Assistance Program. Traci Hensley will ask your household income, and how many people  are in your household. They will quote your out-of-pocket cost based on that information.  [employer] will also be able to set up a 54-month, interest-free payment plan if needed.  Applying the monitor   Shave hair from upper left chest.  Hold abrader disc by orange tab. Rub abrader in 40 strokes over the upper left chest as  indicated in your monitor instructions.  Clean area with 4 enclosed alcohol pads. Let dry.  Apply patch as indicated in monitor instructions. Patch will be placed under collarbone on left  side of chest with arrow pointing upward.  Rub patch adhesive wings for 2 minutes. Remove white label marked "1". Remove the white  label marked "2". Rub patch adhesive wings for 2 additional minutes.  While looking in a mirror, press and release button in center  of patch. A small green light will  flash 3-4 times. This will be your only indicator that the monitor has been turned on.  Do not shower for the first 24 hours. You may shower after the first 24 hours.  Press the button if you feel a symptom. You will hear a small click. Record Date, Time and  Symptom in the Patient Logbook.  When you are ready to remove the patch, follow instructions on the last 2 pages of Patient  Logbook. Stick patch  monitor onto the last page of Patient Logbook.  Place Patient Logbook in the blue and white box. Use locking tab on box and tape box closed  securely. The blue and white box has prepaid postage on it. Please place it in the mailbox as  soon as possible. Your physician should have your test results approximately 7 days after the  monitor has been mailed back to [facility].  Call [place] at [phone] if you have questions regarding  your ZIO XT patch monitor. Call them immediately if you see an orange light blinking on your  monitor.  If your monitor falls off in less than 4 days, contact our Monitor department at [phone].  If your monitor becomes loose or falls off after 4 days call [employer] at [phone] for  suggestions on securing your monitor

## 2022-08-27 NOTE — Progress Notes (Unsigned)
Enrolled for [employer] to mail a ZIO XT long term holter monitor to the patients address on file.  

## 2022-08-28 LAB — TSH: TSH: 3.32 u[IU]/mL (ref 0.450–4.500)

## 2022-09-03 DIAGNOSIS — R002 Palpitations: Secondary | ICD-10-CM

## 2022-09-03 DIAGNOSIS — R5383 Other fatigue: Secondary | ICD-10-CM | POA: Diagnosis not present

## 2022-09-03 DIAGNOSIS — R001 Bradycardia, unspecified: Secondary | ICD-10-CM | POA: Diagnosis not present

## 2022-09-23 DIAGNOSIS — R002 Palpitations: Secondary | ICD-10-CM | POA: Diagnosis not present

## 2022-09-23 DIAGNOSIS — R001 Bradycardia, unspecified: Secondary | ICD-10-CM | POA: Diagnosis not present

## 2022-09-26 ENCOUNTER — Telehealth: Payer: Self-pay | Admitting: Internal Medicine

## 2022-09-26 NOTE — Telephone Encounter (Signed)
Pt's POA would like a callback regarding Heart Monitor results as to she has questions. Please advise

## 2022-09-26 NOTE — Telephone Encounter (Signed)
Spoke with Marijo reports pt is highly upset that MD is saying heart monitor is normal.  MD is calling pt events artifact.  While pt wearing heart monitor would be going down (falling) and pushing button at the same time.  Is upset that monitor is coming back normal and she can't live a normal life.    Advised Marijo that I don't see where MD is calling any readings artifact.  She also reports all of pt EKG tracing all say r axis issues and no one is taking it serious. Advised that pt maybe going down but according to heart monitor there is no abnormal heart rhythm causing symptoms.  Then reports monitor was not accurate because pt is allergic to adhesive and HR was up the whole time d/t allergy.  Once took monitor off HR goes from 40's-150's.  Advised will send concern to MD to review.

## 2022-09-27 NOTE — Telephone Encounter (Signed)
Called Marijo to inform of MD response.  Reports every time pt pushed button on monitor tracing reads artifact on each push. Advised will send to MD to review.

## 2022-10-04 DIAGNOSIS — E538 Deficiency of other specified B group vitamins: Secondary | ICD-10-CM | POA: Diagnosis not present

## 2022-10-15 ENCOUNTER — Ambulatory Visit (INDEPENDENT_AMBULATORY_CARE_PROVIDER_SITE_OTHER): Payer: Medicare Other | Admitting: Family Medicine

## 2022-10-15 ENCOUNTER — Encounter: Payer: Self-pay | Admitting: Family Medicine

## 2022-10-15 VITALS — BP 128/88 | HR 70 | Ht 60.0 in | Wt 118.0 lb

## 2022-10-15 DIAGNOSIS — E538 Deficiency of other specified B group vitamins: Secondary | ICD-10-CM

## 2022-10-15 DIAGNOSIS — Z13220 Encounter for screening for lipoid disorders: Secondary | ICD-10-CM

## 2022-10-15 DIAGNOSIS — E559 Vitamin D deficiency, unspecified: Secondary | ICD-10-CM | POA: Diagnosis not present

## 2022-10-15 DIAGNOSIS — Z7689 Persons encountering health services in other specified circumstances: Secondary | ICD-10-CM

## 2022-10-15 DIAGNOSIS — Z114 Encounter for screening for human immunodeficiency virus [HIV]: Secondary | ICD-10-CM

## 2022-10-15 DIAGNOSIS — D509 Iron deficiency anemia, unspecified: Secondary | ICD-10-CM | POA: Diagnosis not present

## 2022-10-15 DIAGNOSIS — Z131 Encounter for screening for diabetes mellitus: Secondary | ICD-10-CM

## 2022-10-15 DIAGNOSIS — Z1159 Encounter for screening for other viral diseases: Secondary | ICD-10-CM

## 2022-10-15 NOTE — Progress Notes (Signed)
New Patient Office Visit   Subjective   Patient ID: Traci Hensley, female    DOB: December 19, 1990  Age: 32 y.o. MRN: [id number]  CC:  Chief Complaint  Patient presents with   Establish Care    Patient is here to establish care. Wants a cardiology referral.     HPI Traci Hensley 32 year old female, presents to establish care. She  has a past medical history of Agoraphobia, Anxiety, Bipolar 1 disorder (HCC), Chronic abdominal pain, Chronic chest pain, Depression, PTSD (post-traumatic stress disorder), and Scoliosis.For the details of today's visit, please refer to assessment and plan.   HPI    Outpatient Encounter Medications as of 10/15/2022  Medication Sig   albuterol (PROVENTIL HFA;VENTOLIN HFA) 108 (90 Base) MCG/ACT inhaler Inhale into the lungs every 6 (six) hours as needed for wheezing or shortness of breath.   baclofen (LIORESAL) 20 MG tablet Take 20 mg by mouth as needed.   cyanocobalamin (VITAMIN B12) 1000 MCG/ML injection INJECT 1 ML INTRAMUSCULARLY EVERY OTHER WEEK   cyclobenzaprine (FLEXERIL) 10 MG tablet Take 10 mg by mouth as needed.   ELINEST 0.3-30 MG-MCG tablet Take 1 tablet by mouth daily.   EPINEPHrine 0.3 mg/0.3 mL IJ SOAJ injection Inject 0.3 mg into the muscle once. As needed for allergic reaction   ergocalciferol (VITAMIN D2) 1.25 MG (50000 UT) capsule once a week.   fludrocortisone (FLORINEF) 0.1 MG tablet Take 100 mcg by mouth daily.   gabapentin (NEURONTIN) 400 MG capsule Take 400 mg by mouth 3 (three) times daily.   ipratropium-albuterol (DUONEB) 0.5-2.5 (3) MG/3ML SOLN USE 1 SOLUTION IN NEBULIZER 4 TIMES DAILY AS NEEDED   levalbuterol (XOPENEX HFA) 45 MCG/ACT inhaler Inhale 2 puffs into the lungs every 8 (eight) hours as needed for wheezing or shortness of breath.    levalbuterol (XOPENEX) 1.25 MG/3ML nebulizer solution Take 1.25 mg by nebulization every 8 (eight) hours as needed for wheezing or shortness of breath.    meloxicam (MOBIC) 15 MG tablet Take 15  mg by mouth daily.   montelukast (SINGULAIR) 10 MG tablet daily.   nitroGLYCERIN (NITROSTAT) 0.4 MG SL tablet Place 1 tablet (0.4 mg total) under the tongue every 5 (five) minutes as needed for chest pain.   NORTREL 0.5/35, 28, 0.5-35 MG-MCG tablet Take 1 tablet by mouth daily.   ondansetron (ZOFRAN-ODT) 8 MG disintegrating tablet Take by mouth as needed.   RESTASIS MULTIDOSE 0.05 % ophthalmic emulsion Place 1 drop into both eyes 2 (two) times daily.    SSD 1 % cream Apply topically as needed.   gabapentin (NEURONTIN) 300 MG capsule Take by mouth 3 (three) times daily. (Patient not taking: Reported on 10/15/2022)   NONFORMULARY OR COMPOUNDED ITEM Apply 1 application topically 4 (four) times daily. Katamine/Gabapentin/Baclofen/Lidocaine/Menthol Cream   No facility-administered encounter medications on file as of 10/15/2022.    Past Surgical History:  Procedure Laterality Date   MOUTH SURGERY      Review of Systems  Constitutional:  Positive for chills.  Gastrointestinal:  Positive for abdominal pain.  Genitourinary:  Negative for dysuria.  Musculoskeletal:  Positive for myalgias.  Neurological:  Positive for dizziness and headaches.  Psychiatric/Behavioral:  The patient is nervous/anxious.       Objective    BP 128/88   Pulse 70   Ht 5' (1.524 m)   Wt 118 lb (53.5 kg)   SpO2 98%   BMI 23.05 kg/m   Physical Exam Vitals reviewed.  Constitutional:  Appearance: Normal appearance.  HENT:     Head: Normocephalic.  Eyes:     General:        Right eye: No discharge.        Left eye: No discharge.     Conjunctiva/sclera: Conjunctivae normal.  Cardiovascular:     Rate and Rhythm: Normal rate.     Pulses: Normal pulses.     Heart sounds: Normal heart sounds.  Pulmonary:     Effort: Pulmonary effort is normal. No respiratory distress.     Breath sounds: Normal breath sounds.  Abdominal:     General: Bowel sounds are normal.     Palpations: Abdomen is soft.     Tenderness:  There is abdominal tenderness. There is no right CVA tenderness, left CVA tenderness or guarding.  Musculoskeletal:        General: Normal range of motion.     Cervical back: Normal range of motion.  Skin:    General: Skin is warm and dry.     Capillary Refill: Capillary refill takes less than 2 seconds.  Neurological:     General: No focal deficit present.     Mental Status: She is alert and oriented to person, place, and time.     Coordination: Coordination normal.     Gait: Gait normal.       Assessment & Plan:  Screening for diabetes mellitus -     Hemoglobin A1c -     Microalbumin / creatinine urine ratio  Screening for HIV (human immunodeficiency virus) -     HIV Antibody (routine testing w rflx) -     Hepatitis C antibody  Referral of patient -     Ambulatory referral to Cardiology  Need for hepatitis C screening test  Screening for lipid disorders -     BMP8+EGFR -     Lipid panel  Vitamin D deficiency -     VITAMIN D 25 Hydroxy (Vit-D Deficiency, Fractures)  Vitamin B12 deficiency Assessment & Plan: Labs ordered in today's visit - awaiting results will follow up I advise to consume protein rich foods such as lean meats; poultry; eggs; seafood; beans, peas, and lentils; nuts and seeds; and soy products. Fish and red meat are excellent sources of vitamin B12.   Orders: -     Vitamin B12  Iron deficiency anemia, unspecified iron deficiency anemia type -     CBC with Differential/Platelet -     Iron, TIBC and Ferritin Panel    Return in about 3 months (around 01/15/2023), or if symptoms worsen or fail to improve, for chronic follow-up, routine labs.   Traci Lederer Newman Nip, FNP

## 2022-10-15 NOTE — Assessment & Plan Note (Signed)
Labs ordered in today's visit - awaiting results will follow up I advise to consume protein rich foods such as lean meats; poultry; eggs; seafood; beans, peas, and lentils; nuts and seeds; and soy products. Fish and red meat are excellent sources of vitamin B12.

## 2022-10-15 NOTE — Patient Instructions (Signed)

## 2022-10-16 DIAGNOSIS — E538 Deficiency of other specified B group vitamins: Secondary | ICD-10-CM | POA: Diagnosis not present

## 2022-10-16 DIAGNOSIS — Z13220 Encounter for screening for lipoid disorders: Secondary | ICD-10-CM | POA: Diagnosis not present

## 2022-10-16 DIAGNOSIS — Z131 Encounter for screening for diabetes mellitus: Secondary | ICD-10-CM | POA: Diagnosis not present

## 2022-10-16 DIAGNOSIS — E559 Vitamin D deficiency, unspecified: Secondary | ICD-10-CM | POA: Diagnosis not present

## 2022-10-16 DIAGNOSIS — Z114 Encounter for screening for human immunodeficiency virus [HIV]: Secondary | ICD-10-CM | POA: Diagnosis not present

## 2022-10-16 DIAGNOSIS — D509 Iron deficiency anemia, unspecified: Secondary | ICD-10-CM | POA: Diagnosis not present

## 2022-10-16 DIAGNOSIS — E785 Hyperlipidemia, unspecified: Secondary | ICD-10-CM | POA: Diagnosis not present

## 2022-10-17 ENCOUNTER — Other Ambulatory Visit: Payer: Self-pay | Admitting: Family Medicine

## 2022-10-17 MED ORDER — CYANOCOBALAMIN 1000 MCG/ML IJ SOLN: INTRAMUSCULAR | 6 refills | Status: DC

## 2022-10-18 ENCOUNTER — Telehealth: Payer: Self-pay | Admitting: Internal Medicine

## 2022-10-18 NOTE — Telephone Encounter (Signed)
Patient is requesting provider switch from Dr. Izora Ribas to Dr. Jenene Slicker. Requesting to stay in [place] or [place] office.

## 2022-10-24 ENCOUNTER — Encounter (HOSPITAL_COMMUNITY): Payer: Self-pay | Admitting: Radiology

## 2022-10-24 ENCOUNTER — Encounter (HOSPITAL_COMMUNITY): Payer: Self-pay | Admitting: *Deleted

## 2022-10-24 ENCOUNTER — Emergency Department (HOSPITAL_COMMUNITY)
Admission: EM | Admit: 2022-10-24 | Discharge: 2022-10-24 | Disposition: A | Discharge status: Home / Self Care | Payer: Medicare Other | Attending: Emergency Medicine | Admitting: Emergency Medicine

## 2022-10-24 ENCOUNTER — Emergency Department (HOSPITAL_COMMUNITY): Payer: Medicare Other

## 2022-10-24 ENCOUNTER — Other Ambulatory Visit: Payer: Self-pay | Admitting: Family Medicine

## 2022-10-24 ENCOUNTER — Other Ambulatory Visit: Payer: Self-pay

## 2022-10-24 DIAGNOSIS — I7774 Dissection of vertebral artery: Secondary | ICD-10-CM | POA: Diagnosis not present

## 2022-10-24 DIAGNOSIS — Z79899 Other long term (current) drug therapy: Secondary | ICD-10-CM | POA: Diagnosis not present

## 2022-10-24 DIAGNOSIS — Z7982 Long term (current) use of aspirin: Secondary | ICD-10-CM | POA: Insufficient documentation

## 2022-10-24 DIAGNOSIS — R930 Abnormal findings on diagnostic imaging of skull and head, not elsewhere classified: Secondary | ICD-10-CM | POA: Insufficient documentation

## 2022-10-24 DIAGNOSIS — R202 Paresthesia of skin: Secondary | ICD-10-CM | POA: Diagnosis not present

## 2022-10-24 DIAGNOSIS — R29818 Other symptoms and signs involving the nervous system: Secondary | ICD-10-CM | POA: Insufficient documentation

## 2022-10-24 DIAGNOSIS — I6501 Occlusion and stenosis of right vertebral artery: Secondary | ICD-10-CM | POA: Diagnosis not present

## 2022-10-24 DIAGNOSIS — R9431 Abnormal electrocardiogram [ECG] [EKG]: Secondary | ICD-10-CM | POA: Diagnosis not present

## 2022-10-24 DIAGNOSIS — H538 Other visual disturbances: Secondary | ICD-10-CM | POA: Diagnosis present

## 2022-10-24 DIAGNOSIS — R519 Headache, unspecified: Secondary | ICD-10-CM | POA: Diagnosis not present

## 2022-10-24 DIAGNOSIS — R531 Weakness: Secondary | ICD-10-CM | POA: Diagnosis not present

## 2022-10-24 DIAGNOSIS — M542 Cervicalgia: Secondary | ICD-10-CM | POA: Insufficient documentation

## 2022-10-24 HISTORY — DX: Vitamin B12 deficiency anemia, unspecified: D51.9

## 2022-10-24 HISTORY — DX: Dystonia, unspecified: G24.9

## 2022-10-24 LAB — COMPREHENSIVE METABOLIC PANEL
ALT: 12 U/L (ref 0–44)
AST: 15 U/L (ref 15–41)
Albumin: 4.7 g/dL (ref 3.5–5.0)
Alkaline Phosphatase: 39 U/L (ref 38–126)
Anion gap: 11 (ref 5–15)
BUN: 11 mg/dL (ref 6–20)
CO2: 24 mmol/L (ref 22–32)
Calcium: 9.5 mg/dL (ref 8.9–10.3)
Chloride: 103 mmol/L (ref 98–111)
Creatinine, Ser: 0.59 mg/dL (ref 0.44–1.00)
GFR, Estimated: >60 mL/min (ref >60)
Glucose, Bld: 92 mg/dL (ref 70–99)
Potassium: 3.5 mmol/L (ref 3.5–5.1)
Sodium: 138 mmol/L (ref 135–145)
Total Bilirubin: 0.6 mg/dL (ref 0.3–1.2)
Total Protein: 8.1 g/dL (ref 6.5–8.1)

## 2022-10-24 LAB — CBC WITH DIFFERENTIAL/PLATELET
Abs Immature Granulocytes: 0.02 10*3/uL (ref 0.00–0.07)
Basophils Absolute: 0 10*3/uL (ref 0.0–0.1)
Basophils Relative: 0 %
Eosinophils Absolute: 0.1 10*3/uL (ref 0.0–0.5)
Eosinophils Relative: 1 %
HCT: 39.3 % (ref 36.0–46.0)
Hemoglobin: 12.9 g/dL (ref 12.0–15.0)
Immature Granulocytes: 0 %
Lymphocytes Relative: 28 %
Lymphs Abs: 1.9 10*3/uL (ref 0.7–4.0)
MCH: 30.4 pg (ref 26.0–34.0)
MCHC: 32.8 g/dL (ref 30.0–36.0)
MCV: 92.7 fL (ref 80.0–100.0)
Monocytes Absolute: 0.4 10*3/uL (ref 0.1–1.0)
Monocytes Relative: 5 %
Neutro Abs: 4.4 10*3/uL (ref 1.7–7.7)
Neutrophils Relative %: 66 %
Platelets: 252 10*3/uL (ref 150–400)
RBC: 4.24 MIL/uL (ref 3.87–5.11)
RDW: 11.9 % (ref 11.5–15.5)
WBC: 6.7 10*3/uL (ref 4.0–10.5)
nRBC: 0 % (ref 0.0–0.2)

## 2022-10-24 LAB — MAGNESIUM: Magnesium: 2 mg/dL (ref 1.7–2.4)

## 2022-10-24 LAB — CBG MONITORING, ED: Glucose-Capillary: 89 mg/dL (ref 70–99)

## 2022-10-24 LAB — HCG, SERUM, QUALITATIVE: Preg, Serum: NEGATIVE

## 2022-10-24 MED ORDER — ASPIRIN 325 MG PO TABS
325.0000 mg | ORAL_TABLET | Freq: Once | ORAL | Status: AC
  Administered 2022-10-24: 325 mg via ORAL
  Filled 2022-10-24: qty 1

## 2022-10-24 MED ORDER — GADOBUTROL 1 MMOL/ML IV SOLN
5.0000 mL | Freq: Once | INTRAVENOUS | Status: AC | PRN
  Administered 2022-10-24: 5 mL via INTRAVENOUS

## 2022-10-24 MED ORDER — SODIUM CHLORIDE 0.9 % IV BOLUS
1000.0000 mL | Freq: Once | INTRAVENOUS | Status: AC
  Administered 2022-10-24: 1000 mL via INTRAVENOUS

## 2022-10-24 MED ORDER — ASPIRIN 325 MG PO TBEC: 325.0000 mg | DELAYED_RELEASE_TABLET | Freq: Every day | ORAL | 1 refills | Status: DC

## 2022-10-24 MED ORDER — KETOROLAC TROMETHAMINE 15 MG/ML IJ SOLN
15.0000 mg | Freq: Once | INTRAMUSCULAR | Status: AC
  Administered 2022-10-24: 15 mg via INTRAVENOUS
  Filled 2022-10-24: qty 1

## 2022-10-24 MED ORDER — METOCLOPRAMIDE HCL 5 MG/ML IJ SOLN
10.0000 mg | Freq: Once | INTRAMUSCULAR | Status: AC
  Administered 2022-10-24: 10 mg via INTRAVENOUS
  Filled 2022-10-24: qty 2

## 2022-10-24 MED ORDER — SODIUM CHLORIDE 0.9 % IV SOLN: Freq: Once | INTRAVENOUS | Status: AC

## 2022-10-24 MED ORDER — DEXAMETHASONE SODIUM PHOSPHATE 10 MG/ML IJ SOLN
10.0000 mg | Freq: Once | INTRAMUSCULAR | Status: AC
  Administered 2022-10-24: 10 mg via INTRAVENOUS
  Filled 2022-10-24: qty 1

## 2022-10-24 NOTE — Discharge Instructions (Addendum)
Your MRIs today shows you have a tear in your right sided neck artery called a vertebral artery dissection.  You need to be on full dose aspirin daily to help prevent complications such as a stroke.  You will need to follow-up with neurology, which I am referring you to (you should call the office tomorrow) for follow-up and to ensure that this resolves.  If you develop new or worsening pain, severe headache, vision changes, dizziness, weakness or numbness, or any other new/concerning symptoms then return to the ER or call 911.

## 2022-10-24 NOTE — ED Triage Notes (Signed)
Pt c/o blurry vision that she woke up with blurry vision around 0630 this morning. No blurry vision when she went to bed around 2100-2130 last night, but did have neck pain and headache. Pt reports right sided neck pain started a few days ago, the day after she gave blood. Three days ago a headache started. Neither has gotten any better. Pt also c/o nausea, photosensitivity, and generalized weakness. Denies unilateral weakness.

## 2022-10-24 NOTE — ED Provider Notes (Signed)
[facility] AT [facility] Provider Note   CSN: [id number] Arrival date & time: 10/24/22  1027     History  Chief Complaint  Patient presents with   Blurred Vision    Traci Hensley is a 32 y.o. female.  HPI 32 year old female presents with a chief complaint of blurry vision.  About a week ago she started having some right-sided neck pain.  Thought it was a muscle problem and has been taking baclofen, muscle relaxers, ibuprofen, Tylenol and topical medicines that she uses for other painful complaints.  However this has not helped and the pain is still there.  She is also now developed a headache, primarily on the right side.  This morning woke up and had blurry vision out of both eyes.  There is no double vision but she feels like things are slightly moving.  She denies any fevers.  She has chronic weakness and numbness in her extremities that she does not feel like is any different than normal.  Home Medications Prior to Admission medications   Medication Sig Start Date End Date Taking? Authorizing Provider  albuterol (PROVENTIL HFA;VENTOLIN HFA) 108 (90 Base) MCG/ACT inhaler Inhale into the lungs every 6 (six) hours as needed for wheezing or shortness of breath.   Yes [provider]  aspirin EC 325 MG tablet Take 1 tablet (325 mg total) by mouth daily. 10/24/22  Yes Pricilla Loveless, MD  baclofen (LIORESAL) 20 MG tablet Take 20 mg by mouth as needed. 10/14/20  Yes [provider]  cyanocobalamin (VITAMIN B12) 1000 MCG/ML injection INJECT 1 ML INTRAMUSCULARLY EVERY OTHER WEEK 10/17/22  Yes Del Newman Nip, [place], FNP  cyclobenzaprine (FLEXERIL) 10 MG tablet Take 10 mg by mouth as needed. 10/14/20  Yes [provider]  ELINEST 0.3-30 MG-MCG tablet Take 1 tablet by mouth daily. 10/10/22  Yes [provider]  EPINEPHrine 0.3 mg/0.3 mL IJ SOAJ injection Inject 0.3 mg into the muscle once. As needed for allergic reaction   Yes  [provider]  ergocalciferol (VITAMIN D2) 1.25 MG (50000 UT) capsule once a week.   Yes [provider]  fludrocortisone (FLORINEF) 0.1 MG tablet Take 100 mcg by mouth daily. 01/23/21  Yes [provider]  gabapentin (NEURONTIN) 400 MG capsule Take 400 mg by mouth 3 (three) times daily. 07/24/22  Yes [provider]  meloxicam (MOBIC) 15 MG tablet Take 15 mg by mouth daily. 07/22/22  Yes [provider]  montelukast (SINGULAIR) 10 MG tablet daily. 04/11/22  Yes [provider]  nitroGLYCERIN (NITROSTAT) 0.4 MG SL tablet Place 1 tablet (0.4 mg total) under the tongue every 5 (five) minutes as needed for chest pain. 06/17/14  Yes Jake Bathe, MD  NONFORMULARY OR COMPOUNDED ITEM Apply 1 application topically 4 (four) times daily. Katamine/Gabapentin/Baclofen/Lidocaine/Menthol Cream   Yes [provider]  ondansetron (ZOFRAN-ODT) 8 MG disintegrating tablet Take by mouth as needed. 02/12/21  Yes [provider]  SSD 1 % cream Apply topically as needed. 03/23/21  Yes Chandrasekhar, Mahesh A, MD      Allergies    Adhesive [tape], Blue dyes (parenteral), Contrast media [iodinated contrast media], Corn-containing products, Green dyes, Nickel, Omnipaque [iohexol], Onion, Other, Petroleum distillate, Silicone, Bee venom, Cortisporin [bacitra-neomycin-polymyxin-hc], and Yellow jacket venom    Review of Systems   Review of Systems  Constitutional:  Negative for fever.  Eyes:  Positive for photophobia and visual disturbance.  Musculoskeletal:  Positive for neck pain. Negative for  neck stiffness.  Neurological:  Positive for headaches.    Physical Exam Updated Vital Signs BP 119/71   Pulse 82   Temp 98.4 F (36.9 C) (Oral)   Resp 15   Ht 5' (1.524 m)   Wt 53.5 kg   LMP  (Within Months)   SpO2 99%   BMI 23.05 kg/m  Physical Exam Vitals and nursing note reviewed.  Constitutional:      Appearance: She is well-developed. She is  not ill-appearing or diaphoretic.  HENT:     Head: Normocephalic and atraumatic.  Eyes:     Extraocular Movements: Extraocular movements intact.     Pupils: Pupils are equal, round, and reactive to light.  Neck:   Cardiovascular:     Rate and Rhythm: Normal rate and regular rhythm.     Heart sounds: Normal heart sounds.  Pulmonary:     Effort: Pulmonary effort is normal.     Breath sounds: Normal breath sounds.  Abdominal:     Palpations: Abdomen is soft.     Tenderness: There is no abdominal tenderness.  Musculoskeletal:     Cervical back: Normal range of motion. No rigidity.  Skin:    General: Skin is warm and dry.  Neurological:     Mental Status: She is alert.     Comments: CN 3-12 grossly intact. 5/5 strength in all 4 extremities. Grossly normal sensation. Normal finger to nose.      ED Results / Procedures / Treatments   Labs (all labs ordered are listed, but only abnormal results are displayed) Labs Reviewed  COMPREHENSIVE METABOLIC PANEL  CBC WITH DIFFERENTIAL/PLATELET  MAGNESIUM  HCG, SERUM, QUALITATIVE  CBG MONITORING, ED    EKG EKG Interpretation Date/Time:  Thursday October 24 2022 10:24:56 EDT Ventricular Rate:  82 PR Interval:  150 QRS Duration:  91 QT Interval:  378 QTC Calculation: 442 R Axis:   83  Text Interpretation: Sinus rhythm no acute ST/T changes similar to June 2024 Confirmed by Pricilla Loveless [phone]) on 10/24/2022 10:32:04 AM  Radiology MR BRAIN WO CONTRAST  Result Date: 10/24/2022 CLINICAL DATA:  Neuro deficit, acute, stroke suspected; Dural venous sinus thrombosis suspected; Vertebral artery dissection suspected. EXAM: MRI HEAD WITHOUT CONTRAST MRA HEAD WITHOUT CONTRAST MRV HEAD WITHOUT AND WITH CONTRAST MRA NECK WITHOUT AND WITH CONTRAST TECHNIQUE: Multiplanar, multi-echo pulse sequences of the brain and surrounding structures were acquired without intravenous contrast. Angiographic images of the Circle of Willis were acquired using MRA  technique without intravenous contrast. Angiographic images of the intracranial venous structures were acquired using MRV technique without and with intravenous contrast. Angiographic images of the neck were acquired using MRA technique without and with intravenous contrast. Carotid stenosis measurements (when applicable) are obtained utilizing NASCET criteria, using the distal internal carotid diameter as the denominator. 3D post processing, including maximum intensity projections and multiplanar reformations, was performed for better evaluation of the vasculature. CONTRAST:  5mL GADAVIST GADOBUTROL 1 MMOL/ML IV SOLN COMPARISON:  None Available. FINDINGS: MRI HEAD FINDINGS Brain: No acute infarct or hemorrhage. No mass or midline shift. No hydrocephalus or extra-axial collection. No abnormal susceptibility. Vascular: Normal flow voids. Skull and upper cervical spine: Normal marrow signal. Sinuses/Orbits: No acute or significant finding. Other: None. MRA HEAD FINDINGS Anterior circulation: Intracranial ICAs are patent without stenosis or aneurysm. The proximal ACAs and MCAs are patent without stenosis or aneurysm. Distal branches are symmetric. Posterior circulation: Visualized portions of the distal vertebral arteries and basilar artery are patent without stenosis or aneurysm.  The SCAs, AICAs and PICAs are patent proximally. The PCAs are patent proximally without stenosis or aneurysm. Distal branches are symmetric. Anatomic variants: None. MRV HEAD FINDINGS No evidence of dural venous sinus or deep cerebral vein thrombosis. No dural venous sinus stenosis. MRA NECK FINDINGS Aortic arch: Three-vessel arch configuration. Arch vessel origins are patent. Right carotid system: No evidence of dissection, stenosis (50% or greater), or occlusion. Left carotid system: No evidence of dissection, stenosis (50% or greater), or occlusion. Vertebral arteries: Dissection of the right vertebral artery, proximal V2 segment, with  possible intramural hematoma (axial image 17 series 34). The right vertebral artery is mildly narrowed through the entire V2 segment and reconstitutes at the level of the V3 segment. Left vertebral artery is patent without evidence of stenosis or dissection. Other: None. IMPRESSION: 1. No acute intracranial abnormality. 2. Dissection of the right vertebral artery, proximal V2 segment, with possible intramural hematoma. The right vertebral artery is mildly narrowed through the entire V2 segment and reconstitutes at the level of the V3 segment. 3. No intracranial large vessel occlusion or significant stenosis. 4. No evidence of dural venous sinus or deep cerebral vein thrombosis. Critical Value/emergent results were called by telephone at the time of interpretation on 10/24/2022 at 3:30 pm to provider Pricilla Loveless , who verbally acknowledged these results. Electronically Signed   By: Orvan Falconer M.D.   On: 10/24/2022 15:35   MR MRV HEAD W WO CONTRAST  Result Date: 10/24/2022 CLINICAL DATA:  Neuro deficit, acute, stroke suspected; Dural venous sinus thrombosis suspected; Vertebral artery dissection suspected. EXAM: MRI HEAD WITHOUT CONTRAST MRA HEAD WITHOUT CONTRAST MRV HEAD WITHOUT AND WITH CONTRAST MRA NECK WITHOUT AND WITH CONTRAST TECHNIQUE: Multiplanar, multi-echo pulse sequences of the brain and surrounding structures were acquired without intravenous contrast. Angiographic images of the Circle of Willis were acquired using MRA technique without intravenous contrast. Angiographic images of the intracranial venous structures were acquired using MRV technique without and with intravenous contrast. Angiographic images of the neck were acquired using MRA technique without and with intravenous contrast. Carotid stenosis measurements (when applicable) are obtained utilizing NASCET criteria, using the distal internal carotid diameter as the denominator. 3D post processing, including maximum intensity  projections and multiplanar reformations, was performed for better evaluation of the vasculature. CONTRAST:  5mL GADAVIST GADOBUTROL 1 MMOL/ML IV SOLN COMPARISON:  None Available. FINDINGS: MRI HEAD FINDINGS Brain: No acute infarct or hemorrhage. No mass or midline shift. No hydrocephalus or extra-axial collection. No abnormal susceptibility. Vascular: Normal flow voids. Skull and upper cervical spine: Normal marrow signal. Sinuses/Orbits: No acute or significant finding. Other: None. MRA HEAD FINDINGS Anterior circulation: Intracranial ICAs are patent without stenosis or aneurysm. The proximal ACAs and MCAs are patent without stenosis or aneurysm. Distal branches are symmetric. Posterior circulation: Visualized portions of the distal vertebral arteries and basilar artery are patent without stenosis or aneurysm. The SCAs, AICAs and PICAs are patent proximally. The PCAs are patent proximally without stenosis or aneurysm. Distal branches are symmetric. Anatomic variants: None. MRV HEAD FINDINGS No evidence of dural venous sinus or deep cerebral vein thrombosis. No dural venous sinus stenosis. MRA NECK FINDINGS Aortic arch: Three-vessel arch configuration. Arch vessel origins are patent. Right carotid system: No evidence of dissection, stenosis (50% or greater), or occlusion. Left carotid system: No evidence of dissection, stenosis (50% or greater), or occlusion. Vertebral arteries: Dissection of the right vertebral artery, proximal V2 segment, with possible intramural hematoma (axial image 17 series 34). The right vertebral artery is  mildly narrowed through the entire V2 segment and reconstitutes at the level of the V3 segment. Left vertebral artery is patent without evidence of stenosis or dissection. Other: None. IMPRESSION: 1. No acute intracranial abnormality. 2. Dissection of the right vertebral artery, proximal V2 segment, with possible intramural hematoma. The right vertebral artery is mildly narrowed through  the entire V2 segment and reconstitutes at the level of the V3 segment. 3. No intracranial large vessel occlusion or significant stenosis. 4. No evidence of dural venous sinus or deep cerebral vein thrombosis. Critical Value/emergent results were called by telephone at the time of interpretation on 10/24/2022 at 3:30 pm to provider Pricilla Loveless , who verbally acknowledged these results. Electronically Signed   By: Orvan Falconer M.D.   On: 10/24/2022 15:35   MR ANGIO HEAD WO CONTRAST  Result Date: 10/24/2022 CLINICAL DATA:  Neuro deficit, acute, stroke suspected; Dural venous sinus thrombosis suspected; Vertebral artery dissection suspected. EXAM: MRI HEAD WITHOUT CONTRAST MRA HEAD WITHOUT CONTRAST MRV HEAD WITHOUT AND WITH CONTRAST MRA NECK WITHOUT AND WITH CONTRAST TECHNIQUE: Multiplanar, multi-echo pulse sequences of the brain and surrounding structures were acquired without intravenous contrast. Angiographic images of the Circle of Willis were acquired using MRA technique without intravenous contrast. Angiographic images of the intracranial venous structures were acquired using MRV technique without and with intravenous contrast. Angiographic images of the neck were acquired using MRA technique without and with intravenous contrast. Carotid stenosis measurements (when applicable) are obtained utilizing NASCET criteria, using the distal internal carotid diameter as the denominator. 3D post processing, including maximum intensity projections and multiplanar reformations, was performed for better evaluation of the vasculature. CONTRAST:  5mL GADAVIST GADOBUTROL 1 MMOL/ML IV SOLN COMPARISON:  None Available. FINDINGS: MRI HEAD FINDINGS Brain: No acute infarct or hemorrhage. No mass or midline shift. No hydrocephalus or extra-axial collection. No abnormal susceptibility. Vascular: Normal flow voids. Skull and upper cervical spine: Normal marrow signal. Sinuses/Orbits: No acute or significant finding. Other:  None. MRA HEAD FINDINGS Anterior circulation: Intracranial ICAs are patent without stenosis or aneurysm. The proximal ACAs and MCAs are patent without stenosis or aneurysm. Distal branches are symmetric. Posterior circulation: Visualized portions of the distal vertebral arteries and basilar artery are patent without stenosis or aneurysm. The SCAs, AICAs and PICAs are patent proximally. The PCAs are patent proximally without stenosis or aneurysm. Distal branches are symmetric. Anatomic variants: None. MRV HEAD FINDINGS No evidence of dural venous sinus or deep cerebral vein thrombosis. No dural venous sinus stenosis. MRA NECK FINDINGS Aortic arch: Three-vessel arch configuration. Arch vessel origins are patent. Right carotid system: No evidence of dissection, stenosis (50% or greater), or occlusion. Left carotid system: No evidence of dissection, stenosis (50% or greater), or occlusion. Vertebral arteries: Dissection of the right vertebral artery, proximal V2 segment, with possible intramural hematoma (axial image 17 series 34). The right vertebral artery is mildly narrowed through the entire V2 segment and reconstitutes at the level of the V3 segment. Left vertebral artery is patent without evidence of stenosis or dissection. Other: None. IMPRESSION: 1. No acute intracranial abnormality. 2. Dissection of the right vertebral artery, proximal V2 segment, with possible intramural hematoma. The right vertebral artery is mildly narrowed through the entire V2 segment and reconstitutes at the level of the V3 segment. 3. No intracranial large vessel occlusion or significant stenosis. 4. No evidence of dural venous sinus or deep cerebral vein thrombosis. Critical Value/emergent results were called by telephone at the time of interpretation on 10/24/2022 at 3:30 pm  to provider Pricilla Loveless , who verbally acknowledged these results. Electronically Signed   By: Orvan Falconer M.D.   On: 10/24/2022 15:35   MR Angiogram Neck  W or Wo Contrast  Result Date: 10/24/2022 CLINICAL DATA:  Neuro deficit, acute, stroke suspected; Dural venous sinus thrombosis suspected; Vertebral artery dissection suspected. EXAM: MRI HEAD WITHOUT CONTRAST MRA HEAD WITHOUT CONTRAST MRV HEAD WITHOUT AND WITH CONTRAST MRA NECK WITHOUT AND WITH CONTRAST TECHNIQUE: Multiplanar, multi-echo pulse sequences of the brain and surrounding structures were acquired without intravenous contrast. Angiographic images of the Circle of Willis were acquired using MRA technique without intravenous contrast. Angiographic images of the intracranial venous structures were acquired using MRV technique without and with intravenous contrast. Angiographic images of the neck were acquired using MRA technique without and with intravenous contrast. Carotid stenosis measurements (when applicable) are obtained utilizing NASCET criteria, using the distal internal carotid diameter as the denominator. 3D post processing, including maximum intensity projections and multiplanar reformations, was performed for better evaluation of the vasculature. CONTRAST:  5mL GADAVIST GADOBUTROL 1 MMOL/ML IV SOLN COMPARISON:  None Available. FINDINGS: MRI HEAD FINDINGS Brain: No acute infarct or hemorrhage. No mass or midline shift. No hydrocephalus or extra-axial collection. No abnormal susceptibility. Vascular: Normal flow voids. Skull and upper cervical spine: Normal marrow signal. Sinuses/Orbits: No acute or significant finding. Other: None. MRA HEAD FINDINGS Anterior circulation: Intracranial ICAs are patent without stenosis or aneurysm. The proximal ACAs and MCAs are patent without stenosis or aneurysm. Distal branches are symmetric. Posterior circulation: Visualized portions of the distal vertebral arteries and basilar artery are patent without stenosis or aneurysm. The SCAs, AICAs and PICAs are patent proximally. The PCAs are patent proximally without stenosis or aneurysm. Distal branches are  symmetric. Anatomic variants: None. MRV HEAD FINDINGS No evidence of dural venous sinus or deep cerebral vein thrombosis. No dural venous sinus stenosis. MRA NECK FINDINGS Aortic arch: Three-vessel arch configuration. Arch vessel origins are patent. Right carotid system: No evidence of dissection, stenosis (50% or greater), or occlusion. Left carotid system: No evidence of dissection, stenosis (50% or greater), or occlusion. Vertebral arteries: Dissection of the right vertebral artery, proximal V2 segment, with possible intramural hematoma (axial image 17 series 34). The right vertebral artery is mildly narrowed through the entire V2 segment and reconstitutes at the level of the V3 segment. Left vertebral artery is patent without evidence of stenosis or dissection. Other: None. IMPRESSION: 1. No acute intracranial abnormality. 2. Dissection of the right vertebral artery, proximal V2 segment, with possible intramural hematoma. The right vertebral artery is mildly narrowed through the entire V2 segment and reconstitutes at the level of the V3 segment. 3. No intracranial large vessel occlusion or significant stenosis. 4. No evidence of dural venous sinus or deep cerebral vein thrombosis. Critical Value/emergent results were called by telephone at the time of interpretation on 10/24/2022 at 3:30 pm to provider Pricilla Loveless , who verbally acknowledged these results. Electronically Signed   By: Orvan Falconer M.D.   On: 10/24/2022 15:35    Procedures Procedures    Medications Ordered in ED Medications  aspirin tablet 325 mg (has no administration in time range)  sodium chloride 0.9 % bolus 1,000 mL (0 mLs Intravenous Stopped 10/24/22 1258)  ketorolac (TORADOL) 15 MG/ML injection 15 mg (15 mg Intravenous Given 10/24/22 1041)  metoCLOPramide (REGLAN) injection 10 mg (10 mg Intravenous Given 10/24/22 1042)  dexamethasone (DECADRON) injection 10 mg (10 mg Intravenous Given 10/24/22 1123)  0.9 %  sodium chloride  infusion ( Intravenous New Bag/Given 10/24/22 1304)  gadobutrol (GADAVIST) 1 MMOL/ML injection 5 mL (5 mLs Intravenous Contrast Given 10/24/22 1406)    ED Course/ Medical Decision Making/ A&P                                 Medical Decision Making Amount and/or Complexity of Data Reviewed Labs: ordered.    Details: Normal labs Radiology: ordered and independent interpretation performed.    Details: Right vertebral artery dissection ECG/medicine tests: ordered and independent interpretation performed.    Details: No ischemia  Risk OTC drugs. Prescription drug management.   Patient was given treatment for headache and pain and is feeling a lot better.  Vision has improved.  MRI without stroke.  Discussed MRI findings with radiology.  Does have a right vertebral artery dissection.  Discussed with neuro, Dr. Amada Jupiter, and he feels like this is relatively mild and can be treated as an outpatient with full dose aspirin.  Patient has tolerated aspirin well in the past and has been on a baby aspirin currently.  Otherwise, she feels well enough for discharge.  Will need outpatient neuro follow-up, will refer to [facility].  Otherwise she has been given return precautions.        Final Clinical Impression(s) / ED Diagnoses Final diagnoses:  Vertebral artery dissection (HCC)    Rx / DC Orders ED Discharge Orders          Ordered    Ambulatory referral to Neurology       Comments: An appointment is requested in approximately: 2 weeks   10/24/22 1550    aspirin EC 325 MG tablet  Daily        10/24/22 1550              Pricilla Loveless, MD 10/24/22 1556

## 2022-10-27 ENCOUNTER — Emergency Department (HOSPITAL_COMMUNITY): Payer: Medicare Other

## 2022-10-27 ENCOUNTER — Emergency Department (HOSPITAL_COMMUNITY)
Admission: EM | Admit: 2022-10-27 | Discharge: 2022-10-28 | Disposition: A | Discharge status: Home / Self Care | Payer: Medicare Other | Attending: Emergency Medicine | Admitting: Emergency Medicine

## 2022-10-27 DIAGNOSIS — R519 Headache, unspecified: Secondary | ICD-10-CM | POA: Diagnosis not present

## 2022-10-27 DIAGNOSIS — Z7982 Long term (current) use of aspirin: Secondary | ICD-10-CM | POA: Insufficient documentation

## 2022-10-27 DIAGNOSIS — R000 Tachycardia, unspecified: Secondary | ICD-10-CM | POA: Diagnosis not present

## 2022-10-27 DIAGNOSIS — I6501 Occlusion and stenosis of right vertebral artery: Secondary | ICD-10-CM | POA: Diagnosis not present

## 2022-10-27 DIAGNOSIS — H53131 Sudden visual loss, right eye: Secondary | ICD-10-CM | POA: Diagnosis not present

## 2022-10-27 DIAGNOSIS — H547 Unspecified visual loss: Secondary | ICD-10-CM | POA: Diagnosis not present

## 2022-10-27 DIAGNOSIS — H53451 Other localized visual field defect, right eye: Secondary | ICD-10-CM | POA: Diagnosis not present

## 2022-10-27 DIAGNOSIS — R29818 Other symptoms and signs involving the nervous system: Secondary | ICD-10-CM | POA: Diagnosis not present

## 2022-10-27 DIAGNOSIS — Q048 Other specified congenital malformations of brain: Secondary | ICD-10-CM | POA: Diagnosis not present

## 2022-10-27 DIAGNOSIS — M542 Cervicalgia: Secondary | ICD-10-CM | POA: Diagnosis present

## 2022-10-27 DIAGNOSIS — I7774 Dissection of vertebral artery: Secondary | ICD-10-CM | POA: Diagnosis not present

## 2022-10-27 LAB — CBC WITH DIFFERENTIAL/PLATELET
Abs Immature Granulocytes: 0.02 10*3/uL (ref 0.00–0.07)
Basophils Absolute: 0 10*3/uL (ref 0.0–0.1)
Basophils Relative: 0 %
Eosinophils Absolute: 0.1 10*3/uL (ref 0.0–0.5)
Eosinophils Relative: 1 %
HCT: 40.2 % (ref 36.0–46.0)
Hemoglobin: 13.3 g/dL (ref 12.0–15.0)
Immature Granulocytes: 0 %
Lymphocytes Relative: 23 %
Lymphs Abs: 2 10*3/uL (ref 0.7–4.0)
MCH: 30.4 pg (ref 26.0–34.0)
MCHC: 33.1 g/dL (ref 30.0–36.0)
MCV: 92 fL (ref 80.0–100.0)
Monocytes Absolute: 0.6 10*3/uL (ref 0.1–1.0)
Monocytes Relative: 7 %
Neutro Abs: 5.8 10*3/uL (ref 1.7–7.7)
Neutrophils Relative %: 69 %
Platelets: 290 10*3/uL (ref 150–400)
RBC: 4.37 MIL/uL (ref 3.87–5.11)
RDW: 11.9 % (ref 11.5–15.5)
WBC: 8.4 10*3/uL (ref 4.0–10.5)
nRBC: 0 % (ref 0.0–0.2)

## 2022-10-27 LAB — PROTIME-INR
INR: 0.9 (ref 0.8–1.2)
Prothrombin Time: 12.6 seconds (ref 11.4–15.2)

## 2022-10-27 LAB — BASIC METABOLIC PANEL
Anion gap: 9 (ref 5–15)
BUN: 8 mg/dL (ref 6–20)
CO2: 26 mmol/L (ref 22–32)
Calcium: 9.4 mg/dL (ref 8.9–10.3)
Chloride: 102 mmol/L (ref 98–111)
Creatinine, Ser: 0.57 mg/dL (ref 0.44–1.00)
GFR, Estimated: >60 mL/min (ref >60)
Glucose, Bld: 103 mg/dL — ABNORMAL HIGH (ref 70–99)
Potassium: 4.1 mmol/L (ref 3.5–5.1)
Sodium: 137 mmol/L (ref 135–145)

## 2022-10-27 MED ORDER — DIPHENHYDRAMINE HCL 50 MG/ML IJ SOLN
25.0000 mg | Freq: Once | INTRAMUSCULAR | Status: DC
  Filled 2022-10-27: qty 1

## 2022-10-27 MED ORDER — CLOPIDOGREL BISULFATE 75 MG PO TABS
75.0000 mg | ORAL_TABLET | Freq: Every day | ORAL | Status: DC
  Administered 2022-10-27 – 2022-10-28 (×2): 75 mg via ORAL
  Filled 2022-10-27 (×2): qty 1

## 2022-10-27 MED ORDER — GABAPENTIN 400 MG PO CAPS
400.0000 mg | ORAL_CAPSULE | Freq: Three times a day (TID) | ORAL | Status: DC
  Administered 2022-10-28 (×2): 400 mg via ORAL
  Filled 2022-10-27 (×4): qty 1

## 2022-10-27 MED ORDER — ONDANSETRON HCL 4 MG/2ML IJ SOLN
4.0000 mg | Freq: Once | INTRAMUSCULAR | Status: AC
  Administered 2022-10-27: 4 mg via INTRAVENOUS
  Filled 2022-10-27: qty 2

## 2022-10-27 MED ORDER — FENTANYL CITRATE PF 50 MCG/ML IJ SOSY
50.0000 ug | PREFILLED_SYRINGE | Freq: Once | INTRAMUSCULAR | Status: AC
  Administered 2022-10-27: 50 ug via INTRAVENOUS
  Filled 2022-10-27: qty 1

## 2022-10-27 MED ORDER — LACTATED RINGERS IV BOLUS
1000.0000 mL | Freq: Once | INTRAVENOUS | Status: AC
  Administered 2022-10-27: 1000 mL via INTRAVENOUS

## 2022-10-27 MED ORDER — ASPIRIN 325 MG PO TBEC
325.0000 mg | DELAYED_RELEASE_TABLET | Freq: Every day | ORAL | Status: DC
  Administered 2022-10-28: 325 mg via ORAL
  Filled 2022-10-27: qty 1

## 2022-10-27 MED ORDER — GADOBUTROL 1 MMOL/ML IV SOLN
5.0000 mL | Freq: Once | INTRAVENOUS | Status: AC | PRN
  Administered 2022-10-27: 5 mL via INTRAVENOUS

## 2022-10-27 MED ORDER — SODIUM CHLORIDE 0.9 % IV BOLUS
1000.0000 mL | INTRAVENOUS | Status: AC
  Administered 2022-10-27: 1000 mL via INTRAVENOUS

## 2022-10-27 MED ORDER — PROCHLORPERAZINE EDISYLATE 10 MG/2ML IJ SOLN
10.0000 mg | Freq: Once | INTRAMUSCULAR | Status: DC
  Filled 2022-10-27: qty 2

## 2022-10-27 MED ORDER — GADOBUTROL 1 MMOL/ML IV SOLN: 5.0000 mL | Freq: Once | INTRAVENOUS | Status: DC | PRN

## 2022-10-27 MED ORDER — FENTANYL CITRATE PF 50 MCG/ML IJ SOSY
25.0000 ug | PREFILLED_SYRINGE | Freq: Once | INTRAMUSCULAR | Status: AC
  Administered 2022-10-27: 25 ug via INTRAVENOUS
  Filled 2022-10-27: qty 1

## 2022-10-27 MED ORDER — DEXAMETHASONE SODIUM PHOSPHATE 10 MG/ML IJ SOLN
10.0000 mg | Freq: Once | INTRAMUSCULAR | Status: AC
  Administered 2022-10-27: 10 mg via INTRAVENOUS
  Filled 2022-10-27: qty 1

## 2022-10-27 MED ORDER — FLUDROCORTISONE ACETATE 0.1 MG PO TABS
100.0000 ug | ORAL_TABLET | Freq: Every day | ORAL | Status: DC
  Administered 2022-10-28: 100 ug via ORAL
  Filled 2022-10-27: qty 1

## 2022-10-27 MED ORDER — LORAZEPAM 2 MG/ML IJ SOLN
0.5000 mg | Freq: Once | INTRAMUSCULAR | Status: AC
  Administered 2022-10-27: 0.5 mg via INTRAVENOUS
  Filled 2022-10-27: qty 1

## 2022-10-27 MED ORDER — CLOPIDOGREL BISULFATE 75 MG PO TABS: 75.0000 mg | ORAL_TABLET | Freq: Every day | ORAL | 2 refills | Status: DC

## 2022-10-27 MED ORDER — METOCLOPRAMIDE HCL 5 MG/ML IJ SOLN
10.0000 mg | Freq: Once | INTRAMUSCULAR | Status: AC
  Administered 2022-10-27: 10 mg via INTRAVENOUS
  Filled 2022-10-27: qty 2

## 2022-10-27 MED ORDER — MAGNESIUM SULFATE 2 GM/50ML IV SOLN
2.0000 g | Freq: Once | INTRAVENOUS | Status: AC
  Administered 2022-10-27: 2 g via INTRAVENOUS
  Filled 2022-10-27: qty 50

## 2022-10-27 MED ORDER — MUSCLE RUB 10-15 % EX CREA
TOPICAL_CREAM | CUTANEOUS | Status: DC | PRN
  Filled 2022-10-27: qty 85

## 2022-10-27 NOTE — ED Triage Notes (Signed)
Pt stated that she was seen here on Thursday and had an MRI done which showed a tear in her vertebral artery. Pt was told to follow up with neuro STAT but has not been able to get an appointment. Pt was also told that if her pain got worse or she began to have a headache to come back to the ED.
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
## 2022-10-27 NOTE — ED Notes (Signed)
Neurology at bedside.

## 2022-10-27 NOTE — ED Provider Notes (Signed)
[facility] AT [facility] Provider Note   CSN: [id number] Arrival date & time: 10/27/22  1349     History  Chief Complaint  Patient presents with   Pain    Traci Hensley is a 32 y.o. female with PMH as listed below who presents with Right sided head/neck pain, blurry vision, loss of peripheral vision.   Per chart review patient presented to the ED on 10/24/2022 for right-sided neck pain and headache as well as binocular blurry vision.  Had an MRI that demonstrated a right vertebral artery dissection.  Neurology recommended treatment with full dose aspirin and outpatient follow-up with [facility] neurology.  Since that time patient reports that she has not been able to see neurology yet but that her pain has gotten worse.  She states that she woke up this morning with a severely worsening pain in the right side of her neck and head, this time it is now radiating down into her right chest as well as the left side of her neck.  She states she has numbness and tingling anyway due to neuropathy and that is not new.  She states she has now lost peripheral vision in her right eye.  She states she did not have a CT performed other day because she is allergic to iodinated contrast. Patient has been taking her full dose aspirin and hasn't taken other pain meds today. Endorses pain 10/10, worst headache of her life, a/w photophobia. Afraid to move her head/neck d/t pain. No new trauma/falls. No LOC. No facial droop, slurred speech, asymmetric weakness.     Past Medical History:  Diagnosis Date   Agoraphobia    Anxiety    B12 deficiency anemia    Bipolar 1 disorder (HCC)    Chronic abdominal pain    Chronic chest pain    Depression    Dystonia    Unknown which type   PTSD (post-traumatic stress disorder)    Scoliosis        Home Medications Prior to Admission medications   Medication Sig Start Date End Date Taking? Authorizing Provider  albuterol (PROVENTIL  HFA;VENTOLIN HFA) 108 (90 Base) MCG/ACT inhaler Inhale into the lungs every 6 (six) hours as needed for wheezing or shortness of breath.    [provider]  aspirin EC 325 MG tablet Take 1 tablet (325 mg total) by mouth daily. 10/24/22   Pricilla Loveless, MD  baclofen (LIORESAL) 20 MG tablet Take 20 mg by mouth as needed. 10/14/20   [provider]  cyanocobalamin (VITAMIN B12) 1000 MCG/ML injection INJECT 1 ML INTRAMUSCULARLY EVERY OTHER WEEK 10/17/22   Del Newman Nip, Tenna Child, FNP  cyclobenzaprine (FLEXERIL) 10 MG tablet Take 10 mg by mouth as needed. 10/14/20   [provider]  ELINEST 0.3-30 MG-MCG tablet Take 1 tablet by mouth daily. 10/10/22   [provider]  EPINEPHrine 0.3 mg/0.3 mL IJ SOAJ injection Inject 0.3 mg into the muscle once. As needed for allergic reaction    [provider]  ergocalciferol (VITAMIN D2) 1.25 MG (50000 UT) capsule once a week.    [provider]  fludrocortisone (FLORINEF) 0.1 MG tablet Take 100 mcg by mouth daily. 01/23/21   [provider]  gabapentin (NEURONTIN) 400 MG capsule Take 400 mg by mouth 3 (three) times daily. 07/24/22   [provider]  meloxicam (MOBIC) 15 MG tablet Take 15 mg by mouth daily. 07/22/22   [provider]  montelukast (SINGULAIR) 10 MG  tablet daily. 04/11/22   [provider]  nitroGLYCERIN (NITROSTAT) 0.4 MG SL tablet Place 1 tablet (0.4 mg total) under the tongue every 5 (five) minutes as needed for chest pain. 06/17/14   Jake Bathe, MD  NONFORMULARY OR COMPOUNDED ITEM Apply 1 application topically 4 (four) times daily. Katamine/Gabapentin/Baclofen/Lidocaine/Menthol Cream    [provider]  ondansetron (ZOFRAN-ODT) 8 MG disintegrating tablet Take by mouth as needed. 02/12/21   [provider]  SSD 1 % cream Apply topically as needed. 03/23/21   Christell Constant, MD      Allergies    Adhesive [tape], Blue dyes (parenteral),  Contrast media [iodinated contrast media], Corn-containing products, Green dyes, Nickel, Omnipaque [iohexol], Onion, Other, Petroleum distillate, Silicone, Bee venom, Cortisporin [bacitra-neomycin-polymyxin-hc], and Yellow jacket venom    Review of Systems   Review of Systems A 10 point review of systems was performed and is negative unless otherwise reported in HPI.  Physical Exam Updated Vital Signs BP 118/73   Pulse 82   Temp 98.2 F (36.8 C) (Oral)   Resp 16   Ht 5' (1.524 m)   Wt 53.5 kg   SpO2 99%   BMI 23.04 kg/m  Physical Exam General: Uncomfortable appearing female, lying in bed. Patient lying very still with head facing straight forward HEENT: PERRLA 7mm bilaterally reactive, EOMI, Sclera anicteric, MMM, trachea midline.  Cardiology: RRR, no murmurs/rubs/gallops. BL radial and DP pulses equal bilaterally.  Resp: Normal respiratory rate and effort. CTAB, no wheezes, rhonchi, crackles.  Abd: Soft, non-tender, non-distended. No rebound tenderness or guarding.  GU: Deferred. MSK: No peripheral edema or signs of trauma.  Skin: warm, dry. Cutting scars on left wrist. Neuro: A&Ox4, CNs II-XII grossly intact. 5/5 strength all extremities. Sensation grossly intact.  Psych: Anxious mood and affect.   1a  Level of consciousness: 0=alert; keenly responsive  1b. LOC questions:  0=Performs both tasks correctly  1c. LOC commands: 0=Performs both tasks correctly  2.  Best Gaze: 0=normal  3.  Visual: 1=Partial hemianopia  4. Facial Palsy: 0=Normal symmetric movement  5a.  Motor left arm: 0=No drift, limb holds 90 (or 45) degrees for full 10 seconds  5b.  Motor right arm: 0=No drift, limb holds 90 (or 45) degrees for full 10 seconds  6a. motor left leg: 0=No drift, limb holds 90 (or 45) degrees for full 10 seconds  6b  Motor right leg:  0=No drift, limb holds 90 (or 45) degrees for full 10 seconds  7. Limb Ataxia: 0=Absent  8.  Sensory: 0=Normal; no sensory loss  9. Best Language:   0=No aphasia, normal  10. Dysarthria: 0=Normal  11. Extinction and Inattention: 0=No abnormality   Total:   1        ED Results / Procedures / Treatments   Labs (all labs ordered are listed, but only abnormal results are displayed) Labs Reviewed  BASIC METABOLIC PANEL - Abnormal; Notable for the following components:      Result Value   Glucose, Bld 103 (*)    All other components within normal limits  CBC WITH DIFFERENTIAL/PLATELET  PROTIME-INR    EKG None  Radiology No results found.  Procedures Procedures    Medications Ordered in ED Medications  prochlorperazine (COMPAZINE) injection 10 mg (10 mg Intravenous Patient Refused/Not Given 10/27/22 1602)  diphenhydrAMINE (BENADRYL) injection 25 mg (25 mg Intravenous Patient Refused/Not Given 10/27/22 1603)  gadobutrol (GADAVIST) 1 MMOL/ML injection 5 mL (has no administration in time range)  lactated ringers  bolus 1,000 mL (1,000 mLs Intravenous New Bag/Given 10/27/22 1637)  fentaNYL (SUBLIMAZE) injection 25 mcg (25 mcg Intravenous Given 10/27/22 1626)  LORazepam (ATIVAN) injection 0.5 mg (0.5 mg Intravenous Given 10/27/22 1626)  metoCLOPramide (REGLAN) injection 10 mg (10 mg Intravenous Given 10/27/22 1625)  gadobutrol (GADAVIST) 1 MMOL/ML injection 5 mL (5 mLs Intravenous Contrast Given 10/27/22 1744)  fentaNYL (SUBLIMAZE) injection 25 mcg (25 mcg Intravenous Given 10/27/22 1848)    ED Course/ Medical Decision Making/ A&P                          Medical Decision Making Amount and/or Complexity of Data Reviewed Labs: ordered. Radiology: ordered.  Risk Prescription drug management.    This patient presents to the ED for concern of Right sided head/neck pain, blurry vision, loss of peripheral vision; this involves an extensive number of treatment options, and is a complaint that carries with it a high risk of complications and morbidity.  I considered the following differential and admission for this acute, potentially  life threatening condition.   MDM:    Concerning for worsening arterial dissection, now patient with loss of peripheral vision in right eye and pain into her chest and left side of her neck.  Neurology had felt that prior her dissection was mild enough to be managed outpatient however concern for worsening symptoms and pain.  Will need updated imaging.Patient cannot receive CT due to severe allergic reaction.  She states last time she received iodinated contrast she started vomiting blood and blacked out.  Will get an MRA here today of head and neck.  Also consider ischemic stroke d/t dissection and will get MRI brain wo contrast. She is out of the window for tPA, as LKN was last night, she woke up with these symptoms, also NIHSS is 1. She is also having R upper chest pain. Does not anatomically make sense for vertebral artery dissection to extend into chest, however she is 31 and this appears to be spontaneous, must consider a congenital issue or connective tissue disorder, could be dissection elsewhere. Her pain is in R upper chest, called radiology to discuss and will have MRA neck extended to aortic arch to further evaluate. EKG ordered as well.  Will also immediately reach out to neurology for recommendations.  Patient will be given a headache cocktail though she does have a allergy listed to Tylenol, so we will give Compazine Benadryl and LR as well as fentanyl.  Clinical Course as of 10/27/22 1904  Wynelle Link Oct 27, 2022  1557 D/w neurology who recommends the MRAs as well as MRI brain wo contrast to evaluate for ischemic stroke caused by dissection. Recommends transfer to [facility] for evaluation. Neurology Dr. Amada Jupiter also states that no reason a vertebral artery dissection should descend into the chest. Will call radiology to discuss chest imaging.  [HN]  1757 CBC, BMP, PT-INR unremarkable [HN]  1846 Patient's MRIs completed but not read yet by radiology. [facility] here to transport patient to [facility].  [HN]     Clinical Course User Index [HN] Loetta Rough, MD    Labs: I Ordered, and personally interpreted labs.  The pertinent results include:  those listed above  Imaging Studies ordered: I ordered imaging studies including MRA H&N w/wo contrast, MRI brain wo contrast I independently visualized and interpreted imaging Radiology read pending on patient transfer  Additional history obtained from chart review, brother at bedside.   Cardiac Monitoring: The patient was maintained  on a cardiac monitor.  I personally viewed and interpreted the cardiac monitored which showed an underlying rhythm of: NSR  Reevaluation: After the interventions noted above, I reevaluated the patient and found that they have :improved  Social Determinants of Health: Lives independently  Disposition:  Transfer ED to ED to [facility] for evaluation by neurology and further w/u and management. MRI/A reads pending.  Co morbidities that complicate the patient evaluation  Past Medical History:  Diagnosis Date   Agoraphobia    Anxiety    B12 deficiency anemia    Bipolar 1 disorder (HCC)    Chronic abdominal pain    Chronic chest pain    Depression    Dystonia    Unknown which type   PTSD (post-traumatic stress disorder)    Scoliosis      Medicines Meds ordered this encounter  Medications   prochlorperazine (COMPAZINE) injection 10 mg   diphenhydrAMINE (BENADRYL) injection 25 mg   lactated ringers bolus 1,000 mL   fentaNYL (SUBLIMAZE) injection 25 mcg   LORazepam (ATIVAN) injection 0.5 mg   metoCLOPramide (REGLAN) injection 10 mg   gadobutrol (GADAVIST) 1 MMOL/ML injection 5 mL   fentaNYL (SUBLIMAZE) injection 25 mcg   gadobutrol (GADAVIST) 1 MMOL/ML injection 5 mL    I have reviewed the patients home medicines and have made adjustments as needed  Problem List / ED Course: Problem List Items Addressed This Visit   None Visit Diagnoses     Vertebral artery dissection (HCC)    -  Primary   Peripheral  vision loss, right       Right-sided headache       Relevant Medications   fentaNYL (SUBLIMAZE) injection 25 mcg (Completed)   fentaNYL (SUBLIMAZE) injection 25 mcg (Completed)                   This note was created using dictation software, which may contain spelling or grammatical errors.    Loetta Rough, MD 10/27/22 [phone]

## 2022-10-27 NOTE — ED Notes (Signed)
TTS in progress 

## 2022-10-27 NOTE — Consult Note (Signed)
NEUROLOGY CONSULTATION NOTE   Date of service: October 27, 2022 Patient Name: Traci Hensley MRN:  [id number] DOB:  06-Mar-1991 Reason for consult: "headache, neck pain, recent vertebral dissection" Requesting Provider: Gloris Manchester, MD _ _ _   _ __   _ __ _ _  __ __   _ __   __ _  History of Present Illness  Traci Hensley is a 32 y.o. female with PMH significant for anemia, B12 deficiency, Bipolar, depression, PTSD who presents to the ED a second time in the last few days for persistent head and neck pain with photophobia, nausea, stabbing pain behind her eyes.  Started about 10 days ago with pain in her neck. Tried tylenol and ibuprofen and did not help. She eventually came to the ED on 8/15 where imaging demonstrated left vertebral V2 dissection. She was discharged home on full dose aspirin.  She reports that the headache cocktail in the ED helped but now has a 10/10 headache with pain not just on the left back of her neck but now on the right in the front. Has significant photophobia, mild phonophobia, nausea. Sneezing makes her headache worse.  She had repeat MRI brain and MRA head and neck as she is allergic to iodinated contrast and the noted Right vertebral V2 dissection is stable. She is compliant with aspirin and has not missed any doses.  She reports chronic weakness and numbness in her arms and legs which has been going on for a while and is no different.  No recent chriopractic neck manipulations, no visit to hair salon with hyperextension of her neck, no car accident. She reports that she felt like she slept wrong one night and then woke up with R neck pain in the AM the next day.  Upon further questioning, she reports being under a tremendous amount of stress. Living situation is stressful. Roommate uses drugs. Also reports depressed as she is not able to do much with her chronic weakness and numbness. Over the last few weeks, has been having more intrusive thoughts. She would  cut her wrist as a teenager and more lately been having these thoughts again. She also feels like she needs help and someone to help her with basic tasks at home but has not been able to get that.   ROS   Constitutional Denies weight loss, fever and chills.   HEENT +photophobia, intact hearing.   Respiratory Denies SOB and cough.   CV Denies palpitations and CP   GI Denies abdominal pain, vomiting and diarrhea. + nausea.  GU Denies dysuria and urinary frequency.   MSK + myalgia and joint pain.   Skin Denies rash and pruritus.   Neurological + headache and syncope.   Psychiatric + recent changes in mood. + anxiety and depression.    Past History   Past Medical History:  Diagnosis Date   Agoraphobia    Anxiety    B12 deficiency anemia    Bipolar 1 disorder (HCC)    Chronic abdominal pain    Chronic chest pain    Depression    Dystonia    Unknown which type   PTSD (post-traumatic stress disorder)    Scoliosis    Past Surgical History:  Procedure Laterality Date   MOUTH SURGERY     Family History  Problem Relation Age of Onset   Arrhythmia Mother    Colon cancer Neg Hx    Social History   Socioeconomic History   Marital status: Single  Spouse name: Not on file   Number of children: Not on file   Years of education: Not on file   Highest education level: Not on file  Occupational History   Not on file  Tobacco Use   Smoking status: Some Days    Current packs/day: 0.00    Types: Cigarettes    Start date: 02/15/2012    Last attempt to quit: 02/14/2014    Years since quitting: 8.7   Smokeless tobacco: Never   Tobacco comments:    rare, social smoker  Vaping Use   Vaping status: Former  Substance and Sexual Activity   Alcohol use: Not Currently   Drug use: No   Sexual activity: Not on file  Other Topics Concern   Not on file  Social History Narrative   Not on file   Social Determinants of Health   Financial Resource Strain: Not on file  Food  Insecurity: Not on file  Transportation Needs: Not on file  Physical Activity: Not on file  Stress: Not on file  Social Connections: Unknown (07/09/2021)   Received from [facility]   Social Network    Social Network: Not on file   Allergies  Allergen Reactions   Adhesive [Tape] Other (See Comments)    Eats skin almost instantly   Blue Dyes (Parenteral) Other (See Comments)    BLUE DYES IS OK BY ITSELF, BUT CANNOT BE COMBINED WITH GREEN DYES   Contrast Media [Iodinated Contrast Media] Nausea And Vomiting    Vomiting up blood, shaking and tremors uncontrollable, dizziness and lightheadness   Corn-Containing Products Anaphylaxis and Nausea And Vomiting    ALSO GRASS FAMILY PLANTS, VEGETABLES, ETC.    Green Dyes Other (See Comments)    GREEN DYE IS OK BY ITSELF, BUT CANNOT BE COMBINED WITH BLUE DYE   Nickel Itching, Swelling, Rash and Other (See Comments)   Omnipaque [Iohexol]     CT tech notes state "Patient allergy to IV dye"   Onion     Cannot be around onion at all or she has breathing difficulty   Other Nausea Only, Rash and Other (See Comments)    Sunlight causes extreme fatigue , Migraine , Cat Dander  Any type of anesthetic causes her to have prolonged sedation, requiring more than normal doses to reverse them   Petroleum Distillate Itching, Swelling and Rash   Silicone Rash    rash   Bee Venom Hives    Severe disorientation   Cortisporin [Bacitra-Neomycin-Polymyxin-Hc]     Otic 1% suspension- causes hearing loss and pain.   Yellow Jacket Venom Hives    Severe disorientation    Medications  (Not in a hospital admission)    Vitals   Vitals:   10/27/22 1453 10/27/22 1500 10/27/22 1800 10/27/22 1927  BP:  122/70 118/73 121/87  Pulse:  80 82 78  Resp:  16 16 16   Temp:  98.2 F (36.8 C)  98.9 F (37.2 C)  TempSrc:  Oral  Oral  SpO2:  99% 99% 99%  Weight: 53.5 kg     Height: 5' (1.524 m)        Body mass index is 23.04 kg/m.  Physical Exam   General:  Laying comfortably in bed; in no acute distress.  HENT: Normal oropharynx and mucosa. Normal external appearance of ears and nose.  Neck: Supple, no pain or tenderness  CV: No JVD. No peripheral edema.  Pulmonary: Symmetric Chest rise. Normal respiratory effort.  Abdomen: Soft to touch,  non-tender.  Ext: No cyanosis, edema, or deformity  Skin: No rash. Normal palpation of skin.   Musculoskeletal: Normal digits and nails by inspection. No clubbing.   Neurologic Examination  Mental status/Cognition: Alert, oriented to self, place, month and year, good attention.  Speech/language: Fluent, comprehension intact, object naming intact, repetition intact.  Cranial nerves:   CN II Pupils equal and reactive to light, no VF deficits    CN III,IV,VI EOM intact, no gaze preference or deviation, no nystagmus    CN V normal sensation in V1, V2, and V3 segments bilaterally    CN VII no asymmetry, no nasolabial fold flattening    CN VIII normal hearing to speech    CN IX & X normal palatal elevation, no uvular deviation    CN XI 5/5 head turn and 5/5 shoulder shrug bilaterally    CN XII midline tongue protrusion    Motor:  Muscle bulk: poor, tone normal. Mvmt Root Nerve  Muscle Right Left Comments  SA C5/6 Ax Deltoid     EF C5/6 Mc Biceps 4+ 4+ Hard to be sure but some component of effort dependent weakness.  EE C6/7/8 Rad Triceps 4+ 4+   WF C6/7 Med FCR     WE C7/8 PIN ECU     F Ab C8/T1 U ADM/FDI 4+ 4+   HF L1/2/3 Fem Illopsoas 5 5   KE L2/3/4 Fem Quad 5 5   DF L4/5 D Peron Tib Ant 3 4+   PF S1/2 Tibial Grc/Sol 4 4+    Reflexes:  Right Left Comments  Pectoralis      Biceps (C5/6) 2+ 2+   Brachioradialis (C5/6) 2+ 2+    Triceps (C6/7) 2+ 2+    Patellar (L3/4) 3 3    Achilles (S1) 2 2    Hoffman Weakly positive Weakly positive    Plantar Withdraws leg Withdraws leg   Jaw jerk    Sensation:  Light touch Gorssly intact   Pin prick    Temperature    Vibration Absent in R foot, intact  otherwise.  Proprioception    Coordination/Complex Motor:  - Finger to Nose intact BL - Heel to shin unable to do - Rapid alternating movement are slowed BL - Gait: Stride length short, antalgic gait. Arm swing poor. Base width narrow. Unable to toe walk.  Labs   CBC:  Recent Labs  Lab 10/24/22 1011 10/27/22 1527  WBC 6.7 8.4  NEUTROABS 4.4 5.8  HGB 12.9 13.3  HCT 39.3 40.2  MCV 92.7 92.0  PLT 252 290    Basic Metabolic Panel:  Lab Results  Component Value Date   NA 137 10/27/2022   K 4.1 10/27/2022   CO2 26 10/27/2022   GLUCOSE 103 (H) 10/27/2022   BUN 8 10/27/2022   CREATININE 0.57 10/27/2022   CALCIUM 9.4 10/27/2022   GFRNONAA >60 10/27/2022   GFRAA 132 04/13/2020   Lipid Panel:  Lab Results  Component Value Date   LDLCALC 144 (H) 10/16/2022   HgbA1c:  Lab Results  Component Value Date   HGBA1C 5.2 10/16/2022   Urine Drug Screen:     Component Value Date/Time   LABOPIA NEGATIVE 10/26/2012 0112   COCAINSCRNUR NEGATIVE 10/26/2012 0112   LABBENZ POSITIVE 10/26/2012 0112   AMPHETMU NEGATIVE 10/26/2012 0112   THCU NEGATIVE 10/26/2012 0112   LABBARB NEGATIVE 10/26/2012 0112    Alcohol Level No results found for: "ETH"  MR Head without contrast(Personally reviewed): No acute abnormalities. Specifically no acute  stroke.  MR angio Head and Neck(Personally reviewed): Stable persistent dissection of proximal R V2. No progression of dissection, no worsening of the mild narrowing noted on my personal review.  Impression   MADELL RATZEL is a 32 y.o. female with PMH significant for anemia, B12 deficiency, Bipolar, depression, PTSD who presents to the ED a second time in the last few days for persistent head and neck pain with photophobia, nausea, stabbing pain behind her eyes. She was rencelty diagnosed with Right extracranial V2 dissection which is stable on repeat imaging. Her neurologic examination is notable for BL distal greater than proximal mild  weakness with some effort dependent component along with decreased vibratory sensation in R foot. She had MRI of her entire neuro axis back in 2019 for evaluation of this and workup back then.   Recommendations  - headache cocktail with decadron, Magnesium, IV fluids, muscle rub, heating pad to her neck.(Multiple allergies) - Pt and OT evaluation - Psych consult for noted recent intrusive thought of hurting herself with prior attempts. - In addition to aspirin, will add plavix 75mg  daily for the next 3 months, then Aspirin alone. - Will need repeat MR Angio head and neck in about 2-3 month to evaluate for stability of the noted R vertebral artery dissection. - follow up with headache specialist Dr. Ocie Doyne with [facility]. - we will be available on an as needed basis and will not be actively following her case. Please feel free to reach out to [place] with any questions or concerns. ______________________________________________________________________   Thank you for the opportunity to take part in the care of this patient. If you have any further questions, please contact the neurology consultation attending.  Signed,  Erick Blinks [facility] _ _ _   _ __   _ __ _ _  __ __   _ __   __ _

## 2022-10-27 NOTE — ED Notes (Signed)
Pt started vomiting prior to receiving Gabapentin. Having increased pain in neck. Orders obtained for pain and nausea meds

## 2022-10-27 NOTE — ED Provider Notes (Signed)
Patient received in transfer from Dr. Jearld Fenton, from [facility].  This patient with history of anxiety, malnutrition, and recent diagnosis of vertebral artery dissection, presented to the ED today for right-sided head and neck pain, and loss of peripheral vision upon waking up this morning.  She endorses lost peripheral vision in right eye.  She underwent MRI and MRI at [facility].  Results did not show any new findings. Physical Exam  BP 118/73   Pulse 82   Temp 98.2 F (36.8 C) (Oral)   Resp 16   Ht 5' (1.524 m)   Wt 53.5 kg   SpO2 99%   BMI 23.04 kg/m   Physical Exam Vitals and nursing note reviewed.  Constitutional:      General: She is not in acute distress.    Appearance: She is well-developed and underweight.  HENT:     Head: Normocephalic and atraumatic.     Right Ear: External ear normal.     Left Ear: External ear normal.     Nose: Nose normal.     Mouth/Throat:     Mouth: Mucous membranes are moist.  Eyes:     Extraocular Movements: Extraocular movements intact.     Conjunctiva/sclera: Conjunctivae normal.  Cardiovascular:     Rate and Rhythm: Normal rate and regular rhythm.  Pulmonary:     Effort: Pulmonary effort is normal. No respiratory distress.  Abdominal:     General: There is no distension.     Palpations: Abdomen is soft.  Musculoskeletal:        General: No swelling. Normal range of motion.     Cervical back: Neck supple.  Skin:    General: Skin is warm and dry.     Coloration: Skin is not jaundiced or pale.  Neurological:     Mental Status: She is alert.     Comments: Subtle weakness and diminished sensation to left upper extremity.  Peripheral vision appears intact.  Psychiatric:        Mood and Affect: Affect is flat.        Behavior: Behavior is withdrawn.     Procedures  Procedures  ED Course / MDM   Clinical Course as of 10/27/22 1933  Wynelle Link Oct 27, 2022  1557 D/w neurology who recommends the MRAs as well as MRI brain wo contrast to  evaluate for ischemic stroke caused by dissection. Recommends transfer to [facility] for evaluation. Neurology Dr. Amada Jupiter also states that no reason a vertebral artery dissection should descend into the chest. Will call radiology to discuss chest imaging.  [HN]  1757 CBC, BMP, PT-INR unremarkable [HN]  1846 Patient's MRIs completed but not read yet by radiology. [facility] here to transport patient to [facility].  [HN]    Clinical Course User Index [HN] Loetta Rough, MD   Medical Decision Making Amount and/or Complexity of Data Reviewed Labs: ordered. Radiology: ordered.  Risk Prescription drug management.   On assessment, patient resting comfortably.  She endorses ongoing headache, neck, and chest pain on the right side.  She appears to have a very subtle weakness in the manage sensation to her right upper extremity.  Neuroexam is otherwise unremarkable.  I spoke with neurologist, Dr. Derry Lory, who recommends PT evaluation.  Additionally, she did disclose to him that she has had recent intrusive thoughts of self-harm.  Will consult TTS while she is here as well. Dr. Derry Lory will order HA medicines. Per his recommendation, patient was started on Plavix and is to continue this  for the next 3 months.  She is to follow-up with neurologist, Dr. Delena Bali.  Patient to remain in the ED tonight pending PT/OT evaluation.       Gloris Manchester, MD 10/27/22 2158

## 2022-10-27 NOTE — ED Notes (Signed)
Pt arrived via [facility] from [facility] for vertebral dissection. Pt was seen on Thurdsay and told to follow up with neurology outpatient. Reports blurred vision that improved then came back. Has loss of peripheral vision to R side. No other neuro deficits. C/o worsening headache

## 2022-10-28 ENCOUNTER — Encounter (HOSPITAL_COMMUNITY): Payer: Self-pay | Admitting: *Deleted

## 2022-10-28 ENCOUNTER — Other Ambulatory Visit: Payer: Self-pay

## 2022-10-28 DIAGNOSIS — I7774 Dissection of vertebral artery: Secondary | ICD-10-CM | POA: Diagnosis not present

## 2022-10-28 MED ORDER — OXYCODONE HCL 5 MG PO TABS: 5.0000 mg | ORAL_TABLET | Freq: Once | ORAL | Status: DC

## 2022-10-28 MED ORDER — FENTANYL CITRATE PF 50 MCG/ML IJ SOSY
50.0000 ug | PREFILLED_SYRINGE | INTRAMUSCULAR | Status: AC | PRN
  Administered 2022-10-28 (×3): 50 ug via INTRAVENOUS
  Filled 2022-10-28 (×3): qty 1

## 2022-10-28 MED ORDER — OXYCODONE-ACETAMINOPHEN 5-325 MG PO TABS
1.0000 | ORAL_TABLET | Freq: Four times a day (QID) | ORAL | 0 refills | Status: AC | PRN
Start: 2022-10-28 — End: 2022-10-31

## 2022-10-28 MED ORDER — METOCLOPRAMIDE HCL 5 MG/ML IJ SOLN
10.0000 mg | INTRAMUSCULAR | Status: AC
  Administered 2022-10-28: 10 mg via INTRAVENOUS
  Filled 2022-10-28: qty 2

## 2022-10-28 NOTE — ED Notes (Signed)
Pt ambulatory with steady gait to bathroom

## 2022-10-28 NOTE — ED Notes (Addendum)
Patient called to nurses station asking for tray to be picked up after dinner, called a second time approximately 1-2 min later yelling at secretary that tray was not picked up yet. Charge RN informed patient that yelling at staff is not appropriate and staff will be in to address needs when able. Pt states "well if they do their damn jobs they wont have to be yelled at". Informed patient that if she continues to yell staff will be instructed to leave room and allow patient to cool off and will reenter when behavior is appropriate. Patient continued to yell and RN left room. Communicated expectations to oncoming shift.

## 2022-10-28 NOTE — Evaluation (Signed)
Physical Therapy Evaluation Patient Details Name: SADIQA TWITTY MRN: [id number] DOB: Dec 20, 1990 Today's Date: 10/28/2022  History of Present Illness  Pt is a 32 y.o. female admitted 8/18 with R vertebral artery dissection. PMH: anemia, B12 deficiency, Bipolar, depression, PTSD   Clinical Impression  PT eval complete. PTA pt lived at home, mod I mobility household distances without AD. Pt reports frequent falls. On eval, she required min assist bed mobility, CGA transfers, and CGA amb 150' with rollator. Initially attempted gait trial without AD. Pt demo'd slow, guard gait pattern with short, shuffle steps. The rollator improved stability and gait quality. Pt educated on locking/unlocking brakes and need for brakes to be engaged when using rollator as a seat. Pt also reports frequent falls due to sudden onset of dizziness and difficulty with prolonged static standing. Having the rollator will provide pt with a sitting surface to assist with both of these issues. Plan is for d/c home today. Pt would benefit from rollator for home use and HHPT.        If plan is discharge home, recommend the following: A little help with walking and/or transfers;A little help with bathing/dressing/bathroom;Assist for transportation;Assistance with cooking/housework   Can travel by Nurse, mental health (4 wheels)  Recommendations for Other Services       Functional Status Assessment Patient has had a recent decline in their functional status and demonstrates the ability to make significant improvements in function in a reasonable and predictable amount of time.     Precautions / Restrictions Precautions Precautions: Fall      Mobility  Bed Mobility Overal bed mobility: Needs Assistance Bed Mobility: Supine to Sit, Sit to Supine     Supine to sit: Min assist Sit to supine: Min assist   General bed mobility comments: min assist due to on stretcher in ED     Transfers Overall transfer level: Needs assistance Equipment used: Ambulation equipment used Transfers: Sit to/from Stand Sit to Stand: Contact guard assist           General transfer comment: increased time to stabilize balance    Ambulation/Gait Ambulation/Gait assistance: Contact guard assist Gait Distance (Feet): 150 Feet Assistive device: Rollator (4 wheels) Gait Pattern/deviations: Step-through pattern, Narrow base of support, Decreased stride length Gait velocity: decreased Gait velocity interpretation: <1.31 ft/sec, indicative of household ambulator   General Gait Details: improved gait stability with use of rollator  Stairs            Wheelchair Mobility     Tilt Bed    Modified Rankin (Stroke Patients Only)       Balance Overall balance assessment: Needs assistance Sitting-balance support: No upper extremity supported, Feet unsupported Sitting balance-Leahy Scale: Good     Standing balance support: No upper extremity supported, Bilateral upper extremity supported, During functional activity Standing balance-Leahy Scale: Fair Standing balance comment: static stand without UE support. Rollator for amb.               High Level Balance Comments: VSS on RA             Pertinent Vitals/Pain Pain Assessment Pain Assessment: 0-10 Pain Score: 7  Pain Location: neck Pain Descriptors / Indicators: Tightness, Grimacing, Guarding, Sore Pain Intervention(s): Premedicated before session, Limited activity within patient's tolerance, Monitored during session    Home Living Family/patient expects to be discharged to:: Private residence Living Arrangements: Non-relatives/Friends Available Help at Discharge: Family;Friend(s);Available 24 hours/day Type of  Home: House Home Access: Level entry       Home Layout: One level Home Equipment: None      Prior Function Prior Level of Function : History of Falls (last six months);Needs assist              Mobility Comments: mod I household mobility without AD       Extremity/Trunk Assessment   Upper Extremity Assessment Upper Extremity Assessment: Defer to OT evaluation    Lower Extremity Assessment Lower Extremity Assessment: Generalized weakness    Cervical / Trunk Assessment Cervical / Trunk Assessment: Kyphotic  Communication   Communication Communication: No apparent difficulties  Cognition Arousal: Alert Behavior During Therapy: WFL for tasks assessed/performed Overall Cognitive Status: Within Functional Limits for tasks assessed                                          General Comments      Exercises     Assessment/Plan    PT Assessment All further PT needs can be met in the next venue of care  PT Problem List Decreased strength;Decreased balance;Pain;Decreased mobility;Decreased activity tolerance;Decreased knowledge of use of DME       PT Treatment Interventions Gait training;Balance training;Patient/family education;Therapeutic activities;DME instruction    PT Goals (Current goals can be found in the Care Plan section)  Acute Rehab PT Goals Patient Stated Goal: decrease pain PT Goal Formulation: All assessment and education complete, DC therapy    Frequency       Co-evaluation               AM-PAC PT "6 Clicks" Mobility  Outcome Measure Help needed turning from your back to your side while in a flat bed without using bedrails?: A Little Help needed moving from lying on your back to sitting on the side of a flat bed without using bedrails?: A Little Help needed moving to and from a bed to a chair (including a wheelchair)?: A Little Help needed standing up from a chair using your arms (e.g., wheelchair or bedside chair)?: A Little Help needed to walk in hospital room?: A Little Help needed climbing 3-5 steps with a railing? : A Lot 6 Click Score: 17    End of Session Equipment Utilized During Treatment: Gait  belt Activity Tolerance: Patient tolerated treatment well Patient left: in bed;with call bell/phone within reach;with family/visitor present Nurse Communication: Mobility status PT Visit Diagnosis: Difficulty in walking, not elsewhere classified (R26.2);Pain    Time: 4403-4742 PT Time Calculation (min) (ACUTE ONLY): 23 min   Charges:   PT Evaluation $PT Eval Moderate Complexity: 1 Mod   PT General Charges $$ ACUTE PT VISIT: 1 Visit         Ferd Glassing., PT  Office # [phone]   Ilda Foil 10/28/2022, 11:05 AM

## 2022-10-28 NOTE — ED Notes (Signed)
Pt calling out reporting CP and heart racing. HR on ED cardiac monitor noted to be 130-160. EKG obtained. Pt writhing in bed, reporting increased chest pain. Spoke with Dr. Estell Harpin face to face, he will come to bedside to assess patient.

## 2022-10-28 NOTE — ED Notes (Addendum)
AVS with prescriptions provided to and discussed with patient and family member at bedside. Pt verbalizes understanding of discharge instructions and denies any questions or concerns at this time. Pt has ride home. Pt ambulated out of department using rolling walker.

## 2022-10-28 NOTE — ED Notes (Addendum)
Pt up and ambulating to restroom with haelp of family member.

## 2022-10-28 NOTE — ED Notes (Signed)
PT at bedside.

## 2022-10-28 NOTE — Discharge Instructions (Signed)
You can take 5 mg of oxycodone every 6 hours for pain.  You should also take 1000 mg of Tylenol every 8 hours.  Please follow-up with neurology at your scheduled appointment.  Continue taking all other prescribed medications.

## 2022-10-28 NOTE — ED Notes (Signed)
Pt at bedside with patient, ambulating in hallway.

## 2022-10-28 NOTE — Discharge Planning (Signed)
Awaiting PT/OT evaluation before proceeding with discharge plan.

## 2022-10-28 NOTE — ED Notes (Signed)
Pt with multiple complaints every time I walk by her room. Specifically, requesting IV be taken out since she is "allergic to catheters." IV patent with no redness, swelling. Requesting to speak with counselor d/t "a lot going on and things have changed" since speaking with TTS last night.

## 2022-10-28 NOTE — ED Notes (Signed)
HR back to WNL, symptoms resolved without intervention.

## 2022-10-28 NOTE — ED Provider Notes (Signed)
Emergency Medicine Observation Re-evaluation Note  Traci Hensley is a 32 y.o. female, seen on rounds today.  Pt initially presented to the ED for complaints of Pain   Physical Exam  BP 132/80   Pulse 93   Temp 98.4 F (36.9 C) (Oral)   Resp (!) 21   Ht 5' (1.524 m)   Wt 53.5 kg   SpO2 98%   BMI 23.04 kg/m  Physical Exam Vitals reviewed.  Constitutional:      General: She is not in acute distress. Cardiovascular:     Rate and Rhythm: Normal rate.  Skin:    General: Skin is dry.  Neurological:     Mental Status: She is alert and oriented to person, place, and time.     Cranial Nerves: No cranial nerve deficit.     Comments: Normal vision, subtle right upper extremity weakness.  Psychiatric:     Comments: No SI, no HI    General:  Cardiac:  Lungs:  Psych:   ED Course / MDM  EKG:EKG Interpretation Date/Time:  Monday October 28 2022 15:23:58 EDT Ventricular Rate:  131 PR Interval:  133 QRS Duration:  88 QT Interval:  295 QTC Calculation: 436 R Axis:   73  Text Interpretation: Sinus tachycardia Probable left atrial enlargement RSR' in V1 or V2, probably normal variant Artifact in lead(s) I III aVL aVF V3 Confirmed by Bethann Berkshire [phone]) on 10/28/2022 3:32:02 PM  I have reviewed the labs performed to date as well as medications administered while in observation.    I was called by nursing to evaluate the patient as they had questions about their treatment plan.  I reviewed the patient's notes, previous imaging.  I reviewed the patient's neurology note do not recommend any additional treatment other than adding Plavix.  This has been prescribed.  I discussed the patient's symptoms with her, as well as her images.  Her primary concern is managing her pain at home.  I do think is reasonable to discharge patient with short-term narcotics given that she does have a vertebral artery dissection.  Patient had mentioned having episodes of having intrusive thoughts, and had  previously had thoughts of self-harm.  At this time, patient is not having thoughts of self-harm, nor of harming others.  I offered having patient be evaluated by a counselor, however with the time that it would take for her to be evaluated, patient was no longer interested in this.  At this time, I do not believe the patient represents an acute danger to herself.  Her primary concerns are stress of being in the hospital, and pain.  Both of which would be improved with discharge and appropriate analgesia.  Patient has been evaluated by PT, she has been given a walker.  She knows to follow-up with neurology.  I will discharge patient at this time.  Plan  Current plan is for discharge.    Anders Simmonds T, DO 10/28/22 1702

## 2022-10-28 NOTE — Evaluation (Signed)
Occupational Therapy Evaluation Patient Details Name: Traci Hensley MRN: [id number] DOB: 27-Feb-1991 Today's Date: 10/28/2022   History of Present Illness Pt is a 32 y.o. female admitted 8/18 with R vertebral artery dissection. PMH: anemia, B12 deficiency, Bipolar, depression, PTSD   Clinical Impression   PTA pt lived at home, was Ind with ADLs/selfcare, home mgt and Mod I mobility household distances without AD. Pt does report frequent falls and impaired B hand grip strength and coordination prior to this ED visit. Pt currently requires min A with LB ADLs and CGA with mobility. Pt educated on B hand grip and GMC/FMC exercises using household items. Pt would benefit form use of ADL A/E at home and agree with PT that having a rollator will provide pt with a sitting surface and pt can sit at sink for bathing and in kitchen for tasks. Plan is for d/c home today.       If plan is discharge home, recommend the following: A little help with bathing/dressing/bathroom;A little help with walking and/or transfers;Assist for transportation;Assistance with cooking/housework;Help with stairs or ramp for entrance    Functional Status Assessment  Patient has had a recent decline in their functional status and demonstrates the ability to make significant improvements in function in a reasonable and predictable amount of time.  Equipment Recommendations  Tub/shower seat;Other (comment) (sock aid, long handle bath sponge, reacher)    Recommendations for Other Services       Precautions / Restrictions Precautions Precautions: Fall Restrictions Weight Bearing Restrictions: No      Mobility Bed Mobility Overal bed mobility: Needs Assistance Bed Mobility: Supine to Sit, Sit to Supine     Supine to sit: Min assist Sit to supine: Min assist   General bed mobility comments: min assist due to on stretcher in ED. Pt able to long sit Ind    Transfers Overall transfer level: Needs  assistance Equipment used: Ambulation equipment used Transfers: Sit to/from Stand Sit to Stand: Contact guard assist           General transfer comment: increased time to stabilize balance      Balance Overall balance assessment: Needs assistance   Sitting balance-Leahy Scale: Good     Standing balance support: No upper extremity supported, Bilateral upper extremity supported, During functional activity Standing balance-Leahy Scale: Fair                             ADL either performed or assessed with clinical judgement   ADL Overall ADL's : Needs assistance/impaired Eating/Feeding: Independent   Grooming: Wash/dry hands;Wash/dry face;Set up;Sitting   Upper Body Bathing: Set up;Sitting Upper Body Bathing Details (indicate cue type and reason): simulated Lower Body Bathing: Minimal assistance;Sitting/lateral leans Lower Body Bathing Details (indicate cue type and reason): simulated Upper Body Dressing : Set up;Sitting   Lower Body Dressing: Minimal assistance   Toilet Transfer: Contact guard assist   Toileting- Clothing Manipulation and Hygiene: Contact guard assist       Functional mobility during ADLs: Contact guard assist General ADL Comments: Pt educated on reacher, sock aide and LH bath sponge for home use     Vision Ability to See in Adequate Light: 0 Adequate Patient Visual Report: No change from baseline       Perception         Praxis         Pertinent Vitals/Pain Pain Assessment Pain Assessment: 0-10 Pain Score: 7  Pain Location:  neck Pain Descriptors / Indicators: Tightness, Grimacing, Guarding, Sore Pain Intervention(s): Limited activity within patient's tolerance, Monitored during session, Premedicated before session     Extremity/Trunk Assessment Upper Extremity Assessment Upper Extremity Assessment: Generalized weakness;RUE deficits/detail;LUE deficits/detail RUE Deficits / Details: impaired grip strength, pt reports  that this has been ongoing LUE Deficits / Details: impaired grip strength, pt reports that this has been ongoing   Lower Extremity Assessment Lower Extremity Assessment: Defer to PT evaluation   Cervical / Trunk Assessment Cervical / Trunk Assessment: Kyphotic   Communication Communication Communication: No apparent difficulties   Cognition Arousal: Alert Behavior During Therapy: WFL for tasks assessed/performed Overall Cognitive Status: Within Functional Limits for tasks assessed                                       General Comments       Exercises Other Exercises Other Exercises: Pt and family educated on B hand FMC and stengthening exercises   Shoulder Instructions      Home Living Family/patient expects to be discharged to:: Private residence Living Arrangements: Non-relatives/Friends Available Help at Discharge: Family;Friend(s);Available 24 hours/day Type of Home: House Home Access: Level entry     Home Layout: One level     Bathroom Shower/Tub: Chief Strategy Officer: Standard     Home Equipment: None          Prior Functioning/Environment Prior Level of Function : History of Falls (last six months);Needs assist             Mobility Comments: mod I household mobility without AD ADLs Comments: Ind with ADLs/selfcare, home mgt        OT Problem List: Decreased strength;Decreased coordination;Pain;Impaired balance (sitting and/or standing);Decreased knowledge of precautions;Impaired UE functional use;Decreased activity tolerance      OT Treatment/Interventions: Self-care/ADL training;Therapeutic exercise;Therapeutic activities;Patient/family education;DME and/or AE instruction    OT Goals(Current goals can be found in the care plan section) Acute Rehab OT Goals Patient Stated Goal: go home OT Goal Formulation: With patient Time For Goal Achievement: 11/11/22  OT Frequency: Other (comment) (defer further OT needs to  [facility] OT)    Co-evaluation              AM-PAC OT "6 Clicks" Daily Activity     Outcome Measure Help from another person eating meals?: None Help from another person taking care of personal grooming?: None Help from another person toileting, which includes using toliet, bedpan, or urinal?: A Little Help from another person bathing (including washing, rinsing, drying)?: A Little Help from another person to put on and taking off regular upper body clothing?: None Help from another person to put on and taking off regular lower body clothing?: A Little 6 Click Score: 21   End of Session Equipment Utilized During Treatment: Gait belt;Rollator (4 wheels) Nurse Communication: Mobility status  Activity Tolerance: Patient limited by pain Patient left: in bed;with call bell/phone within reach;with family/visitor present  OT Visit Diagnosis: Other abnormalities of gait and mobility (R26.89);Pain Pain - Right/Left: Right Pain - part of body:  (neck)                Time: 1610-9604 OT Time Calculation (min): 19 min Charges:  OT Evaluation $OT Eval Moderate Complexity: 1 Mod    Galen Manila 10/28/2022, 11:51 AM

## 2022-10-28 NOTE — Discharge Planning (Signed)
Jarvis Sawa J. Lucretia Roers, RN, BSN, [place] [phone] RNCM spoke with pt at bedside regarding discharge planning for Home Health Services. Offered pt medicare.gov list of home health agencies to choose from.  Pt chose [facility] to render RN and PTservices. Cain Sieve of [facility] notified. Patient made aware that [facility] will be in contact in 24-48 hours.  DME needs identified at this time include rollater.  RNCM ordered through [facility] to be delivered at bedside prior to discharge home today.

## 2022-10-29 ENCOUNTER — Ambulatory Visit (INDEPENDENT_AMBULATORY_CARE_PROVIDER_SITE_OTHER): Payer: Medicare Other | Admitting: Family Medicine

## 2022-10-29 VITALS — BP 120/73 | HR 85 | Resp 16 | Ht 60.0 in | Wt 116.0 lb

## 2022-10-29 DIAGNOSIS — F419 Anxiety disorder, unspecified: Secondary | ICD-10-CM

## 2022-10-29 DIAGNOSIS — F32A Depression, unspecified: Secondary | ICD-10-CM | POA: Diagnosis not present

## 2022-10-29 DIAGNOSIS — Z8659 Personal history of other mental and behavioral disorders: Secondary | ICD-10-CM

## 2022-10-29 DIAGNOSIS — Z5982 Transportation insecurity: Secondary | ICD-10-CM

## 2022-10-29 DIAGNOSIS — R1031 Right lower quadrant pain: Secondary | ICD-10-CM | POA: Diagnosis not present
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
## 2022-10-29 NOTE — Patient Instructions (Signed)
        Great to see you today.   If labs/imaging were collected, we will inform you of lab results once received either by echart message or telephone call.   - echart message- for normal results that have been seen by the patient already.   - telephone call: abnormal results or if patient has not viewed results in their echart.   - Please take medications as prescribed. - Follow up with your primary health provider if any health concerns arises. - If symptoms worsen please contact your primary care provider and/or visit the emergency department.

## 2022-10-29 NOTE — Assessment & Plan Note (Addendum)
Flowsheet Row Office Visit from 10/15/2022 in [facility]  PHQ-9 Total Score 18          10/15/2022   10:34 AM  GAD 7 : Generalized Anxiety Score  Nervous, Anxious, on Edge 3  Control/stop worrying 3  Worry too much - different things 3  Trouble relaxing 3  Restless 3  Easily annoyed or irritable 0  Afraid - awful might happen 3  Total GAD 7 Score 18  Anxiety Difficulty Extremely difficult  Information provided to visit [facility] Patient has a history of bipolar disorder and currently not on medication- requesting behavioral health and psychiatry services  Patient verbally consented to [facility] services about presenting concerns and psychiatric consultation as appropriate. The services will be billed as appropriate for the patient.    Continued discussion on lifestyle changes, establishing a daily routine, going outdoors, exercise, healthy eating habits, mindfulness and mediatation.

## 2022-10-29 NOTE — Assessment & Plan Note (Signed)
Abdominal CT ordered- awaiting results will follow up. Advise Increase oral fluid intake. Bland diet as tolerated. Avoid fluids that have a lot sugar or caffeine, Avoid spicy or fatty food. Avoid alcohol. Can take OTC tylenol for pain. Follow-up in unable to keep food/fluid down x 24 hours, dizziness, fevers, worsening or persistent symptoms to present to ED or contact primary care provider. Patient verbalizes understanding regarding plan of care and all questions answered.

## 2022-10-30 ENCOUNTER — Ambulatory Visit: Payer: Medicare Other | Admitting: Diagnostic Neuroimaging

## 2022-10-30 ENCOUNTER — Telehealth: Payer: Self-pay | Admitting: *Deleted

## 2022-10-30 ENCOUNTER — Encounter: Payer: Self-pay | Admitting: Diagnostic Neuroimaging

## 2022-10-30 VITALS — BP 134/83 | HR 84 | Ht 60.0 in | Wt 116.4 lb

## 2022-10-30 DIAGNOSIS — I7774 Dissection of vertebral artery: Secondary | ICD-10-CM

## 2022-10-30 MED ORDER — GABAPENTIN 400 MG PO CAPS: 800.0000 mg | ORAL_CAPSULE | Freq: Three times a day (TID) | ORAL | 3 refills | Status: DC

## 2022-10-30 MED ORDER — ASPIRIN 81 MG PO TBEC: 81.0000 mg | DELAYED_RELEASE_TABLET | Freq: Every day | ORAL | Status: DC

## 2022-10-30 NOTE — Progress Notes (Signed)
  Care Coordination   Note   10/30/2022 Name: Traci Hensley MRN: [id number] DOB: June 07, 1990  Traci Hensley is a 32 y.o. year old female who sees Del Newman Nip, [place], FNP for primary care. I reached out to Traci Hensley by phone today to offer care coordination services.  Ms. Curiel was given information about Care Coordination services today including:   The Care Coordination services include support from the care team which includes your Nurse Coordinator, Clinical Social Worker, or Pharmacist.  The Care Coordination team is here to help remove barriers to the health concerns and goals most important to you. Care Coordination services are voluntary, and the patient may decline or stop services at any time by request to their care team member.   Care Coordination Consent Status: Patient POA Traci Hensley agreed to services and verbal consent obtained.   Follow up plan:  Telephone appointment with care coordination team member scheduled for:  10/31/22 and 11/05/22  Encounter Outcome:  Pt. Scheduled  [facility]  Care Coordination Care Guide  Direct Dial: [phone]

## 2022-10-30 NOTE — Progress Notes (Signed)
[facility]  PATIENT: Traci Hensley DOB: 02-12-1991  REFERRING CLINICIAN: Pricilla Loveless, MD HISTORY FROM: patient  REASON FOR VISIT: new consult   HISTORICAL  CHIEF COMPLAINT:  Chief Complaint  Patient presents with   New Patient (Initial Visit)    Patient in room #7 with her mother. Patient states she here to discuss her hospital visit for vertebral artery dissection.    HISTORY OF PRESENT ILLNESS:   32 year old female here for evaluation of right vertebral artery dissection.  History of B12 deficiency, anemia, bipolar disorder, depression, PTSD, headaches, dysautonomia.  Early August 2024 patient had onset of right sided neck pain.  She tried ibuprofen and Tylenol which did not help.  She developed some blurred vision and headaches and she went to emergency room on 10/24/2022, and had diagnosis of right vertebral artery dissection.  MRI brain was negative for acute stroke.  She was recommended to to start aspirin and follow-up with neurology.  Patient returned to emergency room on 10/27/2022 due to worsening headaches.  Repeat imaging showed stable to improved right vertebral artery dissection.  She was changed to aspirin 81 mg daily and Plavix 75 mg daily for 3 months and follow-up in neurology clinic.   REVIEW OF SYSTEMS: Full 14 system review of systems performed and negative with exception of: as per HPI.  ALLERGIES: Allergies  Allergen Reactions   Adhesive [Tape] Other (See Comments)    Eats skin almost instantly   Blue Dyes (Parenteral) Other (See Comments)    BLUE DYES IS OK BY ITSELF, BUT CANNOT BE COMBINED WITH GREEN DYES   Contrast Media [Iodinated Contrast Media] Nausea And Vomiting    Vomiting up blood, shaking and tremors uncontrollable, dizziness and lightheadness   Corn-Containing Products Anaphylaxis and Nausea And Vomiting    ALSO GRASS FAMILY PLANTS, VEGETABLES, ETC.    Green Dyes Other (See Comments)    GREEN DYE IS OK BY ITSELF,  BUT CANNOT BE COMBINED WITH BLUE DYE   Nickel Itching, Swelling, Rash and Other (See Comments)   Omnipaque [Iohexol]     CT tech notes state "Patient allergy to IV dye"   Onion     Cannot be around onion at all or she has breathing difficulty   Other Nausea Only, Rash and Other (See Comments)    Sunlight causes extreme fatigue , Migraine , Cat Dander  Any type of anesthetic causes her to have prolonged sedation, requiring more than normal doses to reverse them   Petroleum Distillate Itching, Swelling and Rash   Silicone Rash    rash   Bee Venom Hives    Severe disorientation   Cortisporin [Bacitra-Neomycin-Polymyxin-Hc]     Otic 1% suspension- causes hearing loss and pain.   Yellow Jacket Venom Hives    Severe disorientation    HOME MEDICATIONS: Outpatient Medications Prior to Visit  Medication Sig Dispense Refill   albuterol (PROVENTIL HFA;VENTOLIN HFA) 108 (90 Base) MCG/ACT inhaler Inhale into the lungs every 6 (six) hours as needed for wheezing or shortness of breath.     aspirin EC 325 MG tablet Take 1 tablet (325 mg total) by mouth daily. 30 tablet 1   baclofen (LIORESAL) 20 MG tablet Take 20 mg by mouth as needed.     clopidogrel (PLAVIX) 75 MG tablet Take 1 tablet (75 mg total) by mouth daily. 30 tablet 2   cyanocobalamin (VITAMIN B12) 1000 MCG/ML injection INJECT 1 ML INTRAMUSCULARLY EVERY OTHER WEEK 1 mL 6   cyclobenzaprine (FLEXERIL) 10  MG tablet Take 10 mg by mouth as needed.     ELINEST 0.3-30 MG-MCG tablet Take 1 tablet by mouth daily.     EPINEPHrine 0.3 mg/0.3 mL IJ SOAJ injection Inject 0.3 mg into the muscle once. As needed for allergic reaction     ergocalciferol (VITAMIN D2) 1.25 MG (50000 UT) capsule Take 50,000 Units by mouth once a week.     fludrocortisone (FLORINEF) 0.1 MG tablet Take 100 mcg by mouth daily.     meloxicam (MOBIC) 15 MG tablet Take 15 mg by mouth daily.     montelukast (SINGULAIR) 10 MG tablet Take 10 mg by mouth daily.     nitroGLYCERIN  (NITROSTAT) 0.4 MG SL tablet Place 1 tablet (0.4 mg total) under the tongue every 5 (five) minutes as needed for chest pain. 25 tablet 12   NONFORMULARY OR COMPOUNDED ITEM Apply 1 application  topically 4 (four) times daily. Katamine/Gabapentin/Baclofen/Lidocaine/Menthol Cream     ondansetron (ZOFRAN-ODT) 8 MG disintegrating tablet Take 8 mg by mouth as needed for nausea or vomiting.     oxyCODONE-acetaminophen (PERCOCET/ROXICET) 5-325 MG tablet Take 1 tablet by mouth every 6 (six) hours as needed for up to 3 days for severe pain. 8 tablet 0   SSD 1 % cream Apply topically as needed. 50 g 0   gabapentin (NEURONTIN) 400 MG capsule Take 400 mg by mouth 3 (three) times daily.     No facility-administered medications prior to visit.    PAST MEDICAL HISTORY: Past Medical History:  Diagnosis Date   Agoraphobia    Anxiety    B12 deficiency anemia    Bipolar 1 disorder (HCC)    Chronic abdominal pain    Chronic chest pain    Depression    Dystonia    Unknown which type   PTSD (post-traumatic stress disorder)    Scoliosis     PAST SURGICAL HISTORY: Past Surgical History:  Procedure Laterality Date   MOUTH SURGERY      FAMILY HISTORY: Family History  Problem Relation Age of Onset   Arrhythmia Mother    Colon cancer Neg Hx     SOCIAL HISTORY: Social History   Socioeconomic History   Marital status: Single    Spouse name: Not on file   Number of children: Not on file   Years of education: Not on file   Highest education level: 12th grade  Occupational History   Not on file  Tobacco Use   Smoking status: Some Days    Current packs/day: 0.00    Types: Cigarettes    Start date: 02/15/2012    Last attempt to quit: 02/14/2014    Years since quitting: 8.7   Smokeless tobacco: Never   Tobacco comments:    rare, social smoker  Vaping Use   Vaping status: Former  Substance and Sexual Activity   Alcohol use: Not Currently   Drug use: No   Sexual activity: Not on file  Other  Topics Concern   Not on file  Social History Narrative   Not on file   Social Determinants of Health   Financial Resource Strain: Medium Risk (10/29/2022)   Overall Financial Resource Strain (CARDIA)    Difficulty of Paying Living Expenses: Somewhat hard  Food Insecurity: Food Insecurity Present (10/29/2022)   Hunger Vital Sign    Worried About Running Out of Food in the Last Year: Sometimes true    Ran Out of Food in the Last Year: Sometimes true  Transportation Needs: No  Transportation Needs (10/29/2022)   PRAPARE - Administrator, Civil Service (Medical): No    Lack of Transportation (Non-Medical): No  Physical Activity: Insufficiently Active (10/29/2022)   Exercise Vital Sign    Days of Exercise per Week: 3 days    Minutes of Exercise per Session: 20 min  Stress: Stress Concern Present (10/29/2022)   [employer] of Occupational Health - Occupational Stress Questionnaire    Feeling of Stress : To some extent  Social Connections: Unknown (10/29/2022)   Social Connection and Isolation Panel [NHANES]    Frequency of Communication with Friends and Family: Once a week    Frequency of Social Gatherings with Friends and Family: Patient declined    Attends Religious Services: Never    Database administrator or Organizations: No    Attends Engineer, structural: Not on file    Marital Status: Patient declined  Intimate Partner Violence: Unknown (06/15/2021)   Received from [facility]   HITS    Physically Hurt: Not on file    Insult or Talk Down To: Not on file    Threaten Physical Harm: Not on file    Scream or Curse: Not on file     PHYSICAL EXAM  GENERAL EXAM/CONSTITUTIONAL: Vitals:  Vitals:   10/30/22 0946  BP: 134/83  Pulse: 84  Weight: 116 lb 6.4 oz (52.8 kg)  Height: 5' (1.524 m)   Body mass index is 22.73 kg/m. Wt Readings from Last 3 Encounters:  10/30/22 116 lb 6.4 oz (52.8 kg)  10/29/22 116 lb (52.6 kg)  10/27/22 117 lb 15.8 oz (53.5  kg)   Patient is in no distress; well developed, nourished and groomed; neck is supple  CARDIOVASCULAR: Examination of carotid arteries is normal; no carotid bruits Regular rate and rhythm, no murmurs Examination of peripheral vascular system by observation and palpation is normal  EYES: Ophthalmoscopic exam of optic discs and posterior segments is normal; no papilledema or hemorrhages No results found.  MUSCULOSKELETAL: Gait, strength, tone, movements noted in Neurologic exam below  NEUROLOGIC: MENTAL STATUS:      No data to display         awake, alert, oriented to person, place and time recent and remote memory intact normal attention and concentration language fluent, comprehension intact, naming intact fund of knowledge appropriate  CRANIAL NERVE:  2nd - no papilledema on fundoscopic exam; PHOTOPHOBIA IN RIGHT EYE 2nd, 3rd, 4th, 6th - pupils equal and reactive to light, visual fields full to confrontation, extraocular muscles intact, no nystagmus 5th - facial sensation symmetric 7th - facial strength symmetric 8th - hearing intact 9th - palate elevates symmetrically, uvula midline 11th - shoulder shrug symmetric 12th - tongue protrusion midline  MOTOR:  normal bulk and tone, full strength in the BUE, BLE; EXCEPT RUE AND RLE 4/5 WITH GIVEWAY WEAKNESS  SENSORY:  normal and symmetric to light touch, pinprick, temperature, vibration  COORDINATION:  finger-nose-finger, fine finger movements --> SLOW ON RIGHT WORSE THAN LEFT  REFLEXES:  deep tendon reflexes 1+ and symmetric  GAIT/STATION:  USING WALKER     DIAGNOSTIC DATA (LABS, IMAGING, TESTING) - I reviewed patient records, labs, notes, testing and imaging myself where available.  Lab Results  Component Value Date   WBC 8.4 10/27/2022   HGB 13.3 10/27/2022   HCT 40.2 10/27/2022   MCV 92.0 10/27/2022   PLT 290 10/27/2022      Component Value Date/Time   NA 137 10/27/2022 1527   NA  139 10/16/2022  0851   NA 139 10/26/2012 0112   K 4.1 10/27/2022 1527   K 3.8 10/26/2012 0112   CL 102 10/27/2022 1527   CL 106 10/26/2012 0112   CO2 26 10/27/2022 1527   CO2 25 10/26/2012 0112   GLUCOSE 103 (H) 10/27/2022 1527   GLUCOSE 102 (H) 10/26/2012 0112   BUN 8 10/27/2022 1527   BUN 10 10/16/2022 0851   BUN 12 10/26/2012 0112   CREATININE 0.57 10/27/2022 1527   CREATININE 0.74 10/26/2012 0112   CALCIUM 9.4 10/27/2022 1527   CALCIUM 9.4 10/26/2012 0112   PROT 8.1 10/24/2022 1011   PROT 8.2 10/26/2012 0112   ALBUMIN 4.7 10/24/2022 1011   ALBUMIN 4.5 10/26/2012 0112   AST 15 10/24/2022 1011   AST 20 10/26/2012 0112   ALT 12 10/24/2022 1011   ALT 14 10/26/2012 0112   ALKPHOS 39 10/24/2022 1011   ALKPHOS 70 10/26/2012 0112   BILITOT 0.6 10/24/2022 1011   BILITOT 0.6 10/26/2012 0112   GFRNONAA >60 10/27/2022 1527   GFRNONAA >60 10/26/2012 0112   GFRAA 132 04/13/2020 0000   GFRAA >60 10/26/2012 0112   Lab Results  Component Value Date   CHOL 209 (H) 10/16/2022   HDL 52 10/16/2022   LDLCALC 144 (H) 10/16/2022   TRIG 73 10/16/2022   CHOLHDL 4.0 10/16/2022   Lab Results  Component Value Date   HGBA1C 5.2 10/16/2022   Lab Results  Component Value Date   VITAMINB12 817 10/16/2022   Lab Results  Component Value Date   TSH 3.320 08/27/2022    10/24/22 MRI neck [I reviewed images myself and agree with interpretation. -VRP]  1. No acute intracranial abnormality. 2. Dissection of the right vertebral artery, proximal V2 segment, with possible intramural hematoma. The right vertebral artery is mildly narrowed through the entire V2 segment and reconstitutes at the level of the V3 segment. 3. No intracranial large vessel occlusion or significant stenosis. 4. No evidence of dural venous sinus or deep cerebral vein thrombosis.  10/27/22 MRI HEAD IMPRESSION:  Stable normal brain MRI. No acute intracranial infarct or other abnormality.   10/27/22 MRA HEAD IMPRESSION:  Normal  intracranial MRA.  10/27/22 MRA NECK IMPRESSION: [I reviewed images myself and agree with interpretation. -VRP]  1. Persistent dissection involving the proximal right V2 segment, with mild irregularity and narrowing through the V2 segment. Overall appearance is not significantly changed as compared to recent MRI from 10/24/2022. No extension of dissection or progressive stenosis. 2. Otherwise unremarkable and normal MRA of the neck.    ASSESSMENT AND PLAN  32 y.o. year old female here with:   Dx:  1. Vertebral artery dissection (HCC)      PLAN:  SPONTANEOUS RIGHT VERTEBRAL ARTERY DISSECTION (August 2024) - repeat MRA head wo + MRA neck w/wo in 3 months - continue aspirin 81mg  + clopidogrel 75mg  daily; if repeat imaging improved, then transition to aspirin 81mg  daily alone; otherwise may need 6 months of dual antiplatelet therapy (or longer)  INTRACTABLE HEADACHES / NECK PAIN - tried and failed multiple medications for headaches and migraine in the past (since past 5+ years; Ajovy, Nurtec, topiramate, triptan and others that patient cannot remember) - increase gabapentin to 800mg  three times a day (caution with sedation) - recommend to consider pain mgmt clinic referral  PTSD / ANXIETY - follow up with psychiatry / psychology  HISTORY OF DYSAUTONOMIA - follow up with cardiology  Meds ordered this encounter  Medications  gabapentin (NEURONTIN) 400 MG capsule    Sig: Take 2 capsules (800 mg total) by mouth 3 (three) times daily.    Dispense:  180 capsule    Refill:  3   Return in about 3 months (around 01/30/2023) for MyChart visit (15 min).    Suanne Marker, MD 10/30/2022, 11:02 AM Certified in Neurology, Neurophysiology and Neuroimaging  [facility] [address] [place], [place] [address] [phone]

## 2022-10-30 NOTE — Patient Instructions (Addendum)
  SPONTANEOUS RIGHT VERTEBRAL ARTERY DISSECTION - continue aspirin 81mg  + clopidogrel 75mg  daily x 3 months; then aspirin 81mg  daily alone - repeat MRA head wo + MRA neck w/wo in 3 months  INTRACTABLE HEADACHES / NECK PAIN - tried and failed multiple medications - increase gabapentin to 800mg  three times a day (caution with sedation) - recommend to consider pain mgmt clinic referral  PTSD / ANXIETY - follow up with psychiatry / psychology  HISTORY OF DYSAUTONOMIA - follow up with cardiology

## 2022-10-30 NOTE — Progress Notes (Signed)
  Care Coordination  Outreach Note  10/30/2022 Name: Traci Hensley MRN: [id number] DOB: May 18, 1990   Care Coordination Outreach Attempts: An unsuccessful telephone outreach was attempted today to offer the patient information about available care coordination services.  Follow Up Plan:  Additional outreach attempts will be made to offer the patient care coordination information and services.   Encounter Outcome:  No Answer  Christie Nottingham  Care Coordination Care Guide  Direct Dial: [phone]

## 2022-10-31 ENCOUNTER — Encounter: Payer: Self-pay | Admitting: *Deleted

## 2022-10-31 ENCOUNTER — Telehealth: Payer: Self-pay | Admitting: Family Medicine

## 2022-10-31 ENCOUNTER — Ambulatory Visit: Payer: Self-pay

## 2022-10-31 NOTE — Patient Outreach (Signed)
  Care Coordination   Collaboration  Visit Note   10/31/2022 Name: SYDNEI LAMONDA MRN: [id number] DOB: [birth year]  [name] is a 32 y.o. year old female who sees Del Newman Nip, [place], FNP for primary care. I  spoke with [facility] to follow up on Home Health orders for PT  What matters to the patients health and wellness today?  Home Health PT    Goals Addressed             This Visit's Progress               Patient would like follow up on referral for [facility] PT       Interventions Today    Flowsheet Row Most Recent Value  Chronic Disease   Chronic disease during today's visit Other  [autonomic dysfunction]  General Interventions   General Interventions Discussed/Reviewed Communication with  Communication with --  [T/C to [facility] to f/u on ref for home health PT-HH orders could not be confirmed-info to be provided to [facility] hospital liasion Kandee Keen to confirm that orders have been received-he will call back to confirm that orders have been received and potential start date]              SDOH assessments and interventions completed:  No     Care Coordination Interventions:  Yes, provided   Follow up plan: No further intervention required.   Encounter Outcome:  Pt. Visit Completed

## 2022-10-31 NOTE — Telephone Encounter (Signed)
Patient POA called another message with more detail provided, sent message to PCP

## 2022-10-31 NOTE — Patient Instructions (Signed)
Visit Information  Thank you for taking time to visit with me today. Please don't hesitate to contact me if I can be of assistance to you.   Following are the goals we discussed today:   SW will contact provider to request orders for PT/OT/bath aide and PCS.   Our next appointment is by telephone on 11/07/22 at 10am  Please call the care guide team at [phone] if you need to cancel or reschedule your appointment.   If you are experiencing a Mental Health or Behavioral Health Crisis or need someone to talk to, please call [phone]  Patient verbalizes understanding of instructions and care plan provided today and agrees to view in MyChart. Active MyChart status and patient understanding of how to access instructions and care plan via MyChart confirmed with patient.     Telephone follow up appointment with care management team member scheduled for: 11/07/22 at 10am.  Lysle Morales, BSW Social Worker [phone]

## 2022-10-31 NOTE — Telephone Encounter (Signed)
Patient POA calling says pt is feeling like she is wearing compression socks from the knees down- not sure if this is a side effect from the blood thinners. Please advise Thank you

## 2022-10-31 NOTE — Patient Outreach (Addendum)
  Care Coordination   Initial Visit Note   10/31/2022 Name: Traci Hensley MRN: [id number] DOB: Jun 22, 1990  Traci Hensley is a 32 y.o. year old female who sees Del Newman Nip, [place], FNP for primary care. I spoke with  Traci Hensley and POA Traci Hensley by phone today.  What matters to the patients health and wellness today?  Patient was recently discharged from the hospital with orders for PT.  Patient also requesting OT/bath aide.   SW will follow with [facility] for update on the current order and will discuss patients request for additional services.   Goals Addressed             This Visit's Progress    Care Coordination Activities       Interventions Today    Flowsheet Row Most Recent Value  Chronic Disease   Chronic disease during today's visit Other  [Autonomic dysfunction]  General Interventions   General Interventions Discussed/Reviewed General Interventions Discussed, General Interventions Reviewed, Level of Care  Level of Care Personal Care Services  [Patient was discharged from the hospital and needs PT/OT/bath aide and assistance with ADL's.]              SDOH assessments and interventions completed:  Yes  SDOH Interventions Today    Flowsheet Row Most Recent Value  SDOH Interventions   Food Insecurity Interventions Other (Comment)  [Food bank for additonal assistanc]  Housing Interventions Intervention Not Indicated  Transportation Interventions Intervention Not Indicated, Other (Comment)  [POA provides transportation]  Utilities Interventions Intervention Not Indicated        Care Coordination Interventions:  Yes, provided   Follow up plan: Follow up call scheduled for 11/07/22 at 10am.    Encounter Outcome:  Pt. Visit Completed

## 2022-10-31 NOTE — Telephone Encounter (Signed)
Pt called in having issues feeling like from the k nee down is  wearing compression stockings also having person issues that she would rather discuss with provider   When in ER  pt missed important meds including hormonal med, which thinks is contributing to symptoms..  Patient wants a cll back

## 2022-11-01 NOTE — Patient Outreach (Signed)
  Care Coordination   Follow Up Visit Note   11/01/2022 Name: Traci Hensley MRN: [id number] DOB: 06/22/1990  Traci Hensley is a 32 y.o. year old female who sees Traci Hensley, [place], FNP for primary care. I  spoke to Traci Hensley at [facility] regarding [facility]  What matters to the patients health and wellness today?  Traci Hensley at [place] reports that the referral for [facility] was reviewed and declined due to patients history of psychiatric needs.      Goals Addressed             This Visit's Progress    Care Coordination activities       Interventions Today    Flowsheet Row Most Recent Value  General Interventions   General Interventions Discussed/Reviewed General Interventions Discussed, Level of Care, General Interventions Reviewed  Communication with --  [Traci Hensley at [facility] to discuss referral for [facility]. Traci Hensley reports referral will be denied and they do not have psychiatric nursing services.  Traci Hensley has communicated with ED Nurse.  SW to follow up on plan.]              SDOH assessments and interventions completed:  No     Care Coordination Interventions:  Yes, provided   Follow up plan: No further intervention required.   Encounter Outcome:  Pt. Visit Completed

## 2022-11-03 ENCOUNTER — Other Ambulatory Visit: Payer: Self-pay | Admitting: Family Medicine

## 2022-11-03 ENCOUNTER — Encounter: Payer: Self-pay | Admitting: Family Medicine

## 2022-11-03 DIAGNOSIS — I7774 Dissection of vertebral artery: Secondary | ICD-10-CM

## 2022-11-03 HISTORY — DX: Dissection of vertebral artery: I77.74

## 2022-11-04 NOTE — Telephone Encounter (Signed)
Traci Hensley rescinded due to her having too many psych issues (seemingly more psych than home health issues).  Pt next option will be outpatient PT as I am unable to secure home health PT at this time.

## 2022-11-05 ENCOUNTER — Ambulatory Visit: Payer: Self-pay | Admitting: *Deleted

## 2022-11-05 NOTE — Patient Outreach (Incomplete)
Care Coordination   Initial Visit Note   11/07/2022 late entry updates for 11/05/22  Name: Traci Hensley MRN: [id number] DOB: Jan 15, 1991  [name] is a 32 y.o. year old female who sees Del Newman Nip, [place], FNP for primary care. I spoke with  [name] and her MPOA, Cathren Harsh by phone today.  What matters to the patients health and wellness today?  Voiced concern about an EPIC note indicating a home health denial from [place], voiced interest in obtaining home physical therapy as needed for right extremity weakness, dragging right side of body.  Patient reviewed the note as she was in my chart finding the time for this RN CM telephone outreach to her  Patient requesting an outreach when [facility] notes are written to keep her in the loop Agrees to RN CM entering this in her Chart Voiced appreciation for being able to discuss her concerns.  Patient agreed for RN CM to request assistance from her pcp to complete new home health referrals to agencies in network for her insurance coverage.  No preferred agency.  Patient preference is to have only female home health staff  Patient and Lanae Boast voiced understanding of differences & guidelines for  home health, personal care services and outpatient therapy services. Patient agrees with assistance for home health physical therapy as soon as available and personal care services in the future. She verifies previous outpatient therapy sessions that are no longer preferred.  social work (SW) voiced understanding of the patient's voiced concern and will collaborate with pcp for personal care services    Patient agreed to RN CM collaboration with her primary care provider (PCP) Her PCP voiced understanding during RN CM collaboration with her. A pcp office visit was scheduled on 11/06/22. Pcp agrees to assist with orders/referral for physical therapy and any other needs   Future RN CM follow up outreach agreed upon and scheduled   Collaboration/update with RN CM's Managed Medicaid Manager & Care manager assistant.  Goals Addressed             This Visit's Progress    Patient would like follow up on referral for [facility] PT- RN CM care coordination services   Not on track    Interventions Today    Flowsheet Row Most Recent Value  Chronic Disease   Chronic disease during today's visit Other  General Interventions   General Interventions Discussed/Reviewed --  [10/2822 home health PT referrals sent to [facility]]  Doctor Visits Discussed/Reviewed Doctor Visits Discussed, PCP  PCP/Specialist Visits Compliance with follow-up visit  Communication with PCP/Specialists, Social Work  Merchant navy officer to [employer], Chiropractor, CMA, PCP, Home health referral will be sent via EPIC once referral content ready]  Level of Care Personal Care Services  [Discussed with patient, MPOA, SW & PCP]  Exercise Interventions   Exercise Discussed/Reviewed Exercise Discussed, Physical Activity  Physical Activity Discussed/Reviewed --  [Provided transportation info]  Education Interventions   Education Provided Provided Education  Provided Verbal Education On [employer], Other, Development worker, community  Mental Health Interventions   Mental Health Discussed/Reviewed Mental Health Discussed, Coping Strategies  [Discussed ED visit, & her voiced concerns]  Safety Interventions   Safety Discussed/Reviewed Safety Discussed, Fall Risk  [agreed with patient that she is a fall risk at this time]  Advanced Directive Interventions   Advanced Directives Discussed/Reviewed Advanced Directives Discussed, Advanced Care Planning  Bertram Savin is the MPOA]  SDOH assessments and interventions completed:  Yes     Care Coordination Interventions:  No, not indicated   Follow up plan: Referral made to home health agencies, Tailored case manager Follow up call scheduled for review of RN CM  interventions completed & discussion of Tailored case manager services       Encounter Outcome:  Pt. Visit Completed   Sokhna Christoph L. Noelle Penner, RN, BSN, CCM [facility] Care Management Community Coordinator Office number [phone]

## 2022-11-06 ENCOUNTER — Telehealth: Payer: Self-pay | Admitting: Diagnostic Neuroimaging

## 2022-11-06 ENCOUNTER — Ambulatory Visit (INDEPENDENT_AMBULATORY_CARE_PROVIDER_SITE_OTHER): Payer: Medicare Other | Admitting: Family Medicine

## 2022-11-06 VITALS — BP 124/79 | HR 81 | Ht 60.0 in | Wt 116.0 lb

## 2022-11-06 DIAGNOSIS — R29898 Other symptoms and signs involving the musculoskeletal system: Secondary | ICD-10-CM

## 2022-11-06 DIAGNOSIS — N920 Excessive and frequent menstruation with regular cycle: Secondary | ICD-10-CM | POA: Diagnosis not present

## 2022-11-06 MED ORDER — FLUDROCORTISONE ACETATE 0.1 MG PO TABS: 100.0000 ug | ORAL_TABLET | Freq: Every day | ORAL | 2 refills | Status: DC

## 2022-11-06 NOTE — Patient Instructions (Signed)

## 2022-11-06 NOTE — Telephone Encounter (Signed)
Pt's Medical Power of Attorney(on DPR) has called to report pt just left pcp appointment.  She was informed the PCP believes pt is having a reaction to the blood thinner and that the Gabapentin is no helping, please all the Medical Power of Attorney, she states the PCP did not want to immediatley take pt off of the blood thinner. Not knowing how that would affect pt, please call.

## 2022-11-06 NOTE — Assessment & Plan Note (Addendum)
Patient reports since started Plavix 75 mg two weeks ago prescribed by her neurologist She has episodes dark brown vaginal spotting few times a week. Hx of no menses. CBC labs ordered today- awaiting results will follow up Advise patient to follow up with neurology for medication management

## 2022-11-06 NOTE — Progress Notes (Signed)
Patient Office Visit   Subjective   Patient ID: Traci Hensley, female    DOB: 1990/05/05  Age: 32 y.o. MRN: [id number]  CC:  Chief Complaint  Patient presents with   Depression    F/u for Anxiety and Depression   Medication Reaction    Medication reactions w/ Blood thinners.    Extremity Weakness    Dragging of right side of the body.    Medication Refill    Florinef   Pre-visit Screening Tool Documentation    Pt is requesting Equipment for safety in home, also needing home health care referral. Pt would like to seek video based therapy.     HPI Traci Hensley 32 year old female, presents to the clinic for worsening right leg weakness after vertebral artery dissection diagnosis.  She  has a past medical history of Agoraphobia, Anxiety, B12 deficiency anemia, Bipolar 1 disorder (HCC), Chronic abdominal pain, Chronic chest pain, Depression, Dystonia, PTSD (post-traumatic stress disorder), Scoliosis, and Vertebral artery dissection (HCC) (11/03/2022).For the details of today's visit, please refer to assessment and plan.   HPI    Outpatient Encounter Medications as of 11/06/2022  Medication Sig   albuterol (PROVENTIL HFA;VENTOLIN HFA) 108 (90 Base) MCG/ACT inhaler Inhale into the lungs every 6 (six) hours as needed for wheezing or shortness of breath.   aspirin EC 81 MG tablet Take 1 tablet (81 mg total) by mouth daily.   baclofen (LIORESAL) 20 MG tablet Take 20 mg by mouth as needed.   clopidogrel (PLAVIX) 75 MG tablet Take 1 tablet (75 mg total) by mouth daily.   cyanocobalamin (VITAMIN B12) 1000 MCG/ML injection INJECT 1 ML INTRAMUSCULARLY EVERY OTHER WEEK   cyclobenzaprine (FLEXERIL) 10 MG tablet Take 10 mg by mouth as needed.   ELINEST 0.3-30 MG-MCG tablet Take 1 tablet by mouth daily.   EPINEPHrine 0.3 mg/0.3 mL IJ SOAJ injection Inject 0.3 mg into the muscle once. As needed for allergic reaction   ergocalciferol (VITAMIN D2) 1.25 MG (50000 UT) capsule Take 50,000 Units  by mouth once a week.   gabapentin (NEURONTIN) 400 MG capsule Take 2 capsules (800 mg total) by mouth 3 (three) times daily.   meloxicam (MOBIC) 15 MG tablet Take 15 mg by mouth daily.   montelukast (SINGULAIR) 10 MG tablet Take 10 mg by mouth daily.   nitroGLYCERIN (NITROSTAT) 0.4 MG SL tablet Place 1 tablet (0.4 mg total) under the tongue every 5 (five) minutes as needed for chest pain.   NONFORMULARY OR COMPOUNDED ITEM Apply 1 application  topically 4 (four) times daily. Katamine/Gabapentin/Baclofen/Lidocaine/Menthol Cream   ondansetron (ZOFRAN-ODT) 8 MG disintegrating tablet Take 8 mg by mouth as needed for nausea or vomiting.   SSD 1 % cream Apply topically as needed.   [DISCONTINUED] fludrocortisone (FLORINEF) 0.1 MG tablet Take 100 mcg by mouth daily.   fludrocortisone (FLORINEF) 0.1 MG tablet Take 1 tablet (100 mcg total) by mouth daily.   [DISCONTINUED] fludrocortisone (FLORINEF) 0.1 MG tablet Take 1 tablet (100 mcg total) by mouth daily.   No facility-administered encounter medications on file as of 11/06/2022.    Past Surgical History:  Procedure Laterality Date   MOUTH SURGERY      Review of Systems  Constitutional:  Negative for chills and fever.  Respiratory:  Negative for shortness of breath.   Cardiovascular:  Negative for chest pain.  Genitourinary:        Vaginal Spotting dark brown  Musculoskeletal:  Positive for myalgias.  Objective    BP 124/79 (BP Location: Right Arm, Patient Position: Sitting, Cuff Size: Normal)   Pulse 81   Ht 5' (1.524 m)   Wt 116 lb 0.6 oz (52.6 kg)   SpO2 98%   BMI 22.66 kg/m   Physical Exam Vitals reviewed.  Constitutional:      General: She is not in acute distress.    Appearance: Normal appearance. She is not ill-appearing, toxic-appearing or diaphoretic.  HENT:     Head: Normocephalic.  Eyes:     General:        Right eye: No discharge.        Left eye: No discharge.     Conjunctiva/sclera: Conjunctivae normal.   Cardiovascular:     Rate and Rhythm: Normal rate.     Pulses: Normal pulses.     Heart sounds: Normal heart sounds.  Pulmonary:     Effort: Pulmonary effort is normal. No respiratory distress.     Breath sounds: Normal breath sounds.  Musculoskeletal:        General: Normal range of motion.     Cervical back: Normal range of motion.  Skin:    General: Skin is warm and dry.     Capillary Refill: Capillary refill takes less than 2 seconds.  Neurological:     General: No focal deficit present.     Mental Status: She is alert.     Motor: Weakness present.     Coordination: Coordination abnormal.     Gait: Gait abnormal.  Psychiatric:        Mood and Affect: Mood normal.       Assessment & Plan:  Right leg weakness Assessment & Plan: Advise patient to follow up with neurology or ED for worsening symptoms Patient reports dragging her right leg while trying to walk Currently using a walker to ambulate  Referral placed to physical therapy    Orders: -     Ambulatory referral to Physical Therapy  Spotting Assessment & Plan: Patient reports since started Plavix 75 mg two weeks ago prescribed by her neurologist She has episodes dark brown vaginal spotting few times a week. Hx of no menses. CBC labs ordered today- awaiting results will follow up Advise patient to follow up with neurology for medication management    Orders: -     CBC with Differential/Platelet  Other orders -     Fludrocortisone Acetate; Take 1 tablet (100 mcg total) by mouth daily.  Dispense: 30 tablet; Refill: 2    Return if symptoms worsen or fail to improve.   Cruzita Lederer Newman Nip, FNP

## 2022-11-06 NOTE — Patient Outreach (Signed)
  Care Coordination   Follow Up Visit Note   11/06/2022 Name: Traci Hensley MRN: [id number] DOB: Jul 15, 1990  Traci Hensley is a 32 y.o. year old female who sees Traci Hensley, [place], FNP for primary care. I  communicated with Dr. Lonna Cobb Hensley regarding Personal Care Services request form.  What matters to the patients health and wellness today?  In home care assistance.    Goals Addressed             This Visit's Progress    Care Coordination activities       Interventions Today    Flowsheet Row Most Recent Value  General Interventions   General Interventions Discussed/Reviewed General Interventions Discussed, Level of Care, General Interventions Reviewed  Communication with PCP/Specialists  [SW submitted request form for Personal Care Service to provider for review and completion.]              SDOH assessments and interventions completed:  No     Care Coordination Interventions:  Yes, provided   Follow up plan: No further intervention required.   Encounter Outcome:  Pt. Visit Completed

## 2022-11-06 NOTE — Assessment & Plan Note (Signed)
Advise patient to follow up with neurology or ED for worsening symptoms Patient reports dragging her right leg while trying to walk Currently using a walker to ambulate  Referral placed to physical therapy

## 2022-11-06 NOTE — Telephone Encounter (Signed)
Pt's POA called stating pt has started having brown vaginal spotting since starting the Plavix two weeks ago. She has a few episodes of brown spotting a few times a week. Hx of no menses. Pt's PCP advised them to follow up with her neurologist to manage medication. Also stating that the Gabapentin is not helping. Please advise.

## 2022-11-07 ENCOUNTER — Telehealth: Payer: Self-pay | Admitting: Family Medicine

## 2022-11-07 ENCOUNTER — Ambulatory Visit: Payer: Self-pay | Admitting: *Deleted

## 2022-11-07 DIAGNOSIS — Z7952 Long term (current) use of systemic steroids: Secondary | ICD-10-CM | POA: Diagnosis not present

## 2022-11-07 DIAGNOSIS — M419 Scoliosis, unspecified: Secondary | ICD-10-CM | POA: Diagnosis not present

## 2022-11-07 DIAGNOSIS — G249 Dystonia, unspecified: Secondary | ICD-10-CM | POA: Diagnosis not present

## 2022-11-07 DIAGNOSIS — R109 Unspecified abdominal pain: Secondary | ICD-10-CM | POA: Diagnosis not present

## 2022-11-07 DIAGNOSIS — R079 Chest pain, unspecified: Secondary | ICD-10-CM | POA: Diagnosis not present

## 2022-11-07 DIAGNOSIS — I7774 Dissection of vertebral artery: Secondary | ICD-10-CM | POA: Diagnosis not present

## 2022-11-07 DIAGNOSIS — F319 Bipolar disorder, unspecified: Secondary | ICD-10-CM | POA: Diagnosis not present

## 2022-11-07 DIAGNOSIS — I10 Essential (primary) hypertension: Secondary | ICD-10-CM | POA: Diagnosis not present

## 2022-11-07 DIAGNOSIS — F4310 Post-traumatic stress disorder, unspecified: Secondary | ICD-10-CM | POA: Diagnosis not present

## 2022-11-07 DIAGNOSIS — F4000 Agoraphobia, unspecified: Secondary | ICD-10-CM | POA: Diagnosis not present

## 2022-11-07 DIAGNOSIS — F419 Anxiety disorder, unspecified: Secondary | ICD-10-CM | POA: Diagnosis not present

## 2022-11-07 DIAGNOSIS — Z7982 Long term (current) use of aspirin: Secondary | ICD-10-CM | POA: Diagnosis not present

## 2022-11-07 DIAGNOSIS — Z791 Long term (current) use of non-steroidal anti-inflammatories (NSAID): Secondary | ICD-10-CM | POA: Diagnosis not present

## 2022-11-07 DIAGNOSIS — G8929 Other chronic pain: Secondary | ICD-10-CM | POA: Diagnosis not present

## 2022-11-07 DIAGNOSIS — Z556 Problems related to health literacy: Secondary | ICD-10-CM | POA: Diagnosis not present

## 2022-11-07 DIAGNOSIS — D519 Vitamin B12 deficiency anemia, unspecified: Secondary | ICD-10-CM | POA: Diagnosis not present

## 2022-11-07 DIAGNOSIS — Z7902 Long term (current) use of antithrombotics/antiplatelets: Secondary | ICD-10-CM | POA: Diagnosis not present

## 2022-11-07 DIAGNOSIS — N920 Excessive and frequent menstruation with regular cycle: Secondary | ICD-10-CM | POA: Diagnosis not present

## 2022-11-07 DIAGNOSIS — R531 Weakness: Secondary | ICD-10-CM | POA: Diagnosis not present

## 2022-11-07 LAB — CBC WITH DIFFERENTIAL/PLATELET
Basophils Absolute: 0 10*3/uL (ref 0.0–0.2)
Basos: 0 %
EOS (ABSOLUTE): 0.1 10*3/uL (ref 0.0–0.4)
Eos: 1 %
Hematocrit: 36.7 % (ref 34.0–46.6)
Hemoglobin: 12.3 g/dL (ref 11.1–15.9)
Immature Grans (Abs): 0 10*3/uL (ref 0.0–0.1)
Immature Granulocytes: 1 %
Lymphocytes Absolute: 1.9 10*3/uL (ref 0.7–3.1)
Lymphs: 22 %
MCH: 31 pg (ref 26.6–33.0)
MCHC: 33.5 g/dL (ref 31.5–35.7)
MCV: 92 fL (ref 79–97)
Monocytes Absolute: 0.5 10*3/uL (ref 0.1–0.9)
Monocytes: 5 %
Neutrophils Absolute: 6.3 10*3/uL (ref 1.4–7.0)
Neutrophils: 71 %
Platelets: 294 10*3/uL (ref 150–450)
RBC: 3.97 x10E6/uL (ref 3.77–5.28)
RDW: 11.4 % — ABNORMAL LOW (ref 11.7–15.4)
WBC: 8.7 10*3/uL (ref 3.4–10.8)

## 2022-11-07 MED ORDER — ASPIRIN 325 MG PO TBEC: 325.0000 mg | DELAYED_RELEASE_TABLET | Freq: Every day | ORAL | 5 refills | Status: DC

## 2022-11-07 NOTE — Telephone Encounter (Signed)
Asking for a call back from provider or nurse just got back from the neurologist and would like to discuss this with provider. Call back # [phone]. If they would give a call back today if at all possible. Message left at 423 pm.

## 2022-11-07 NOTE — Telephone Encounter (Signed)
Called the medical POA for the patient to advise I was calling back to discuss the concerned related to the ? Reaction. The patient started plavix on 8/18 when she went into the ER. The patient states that on 8/21 she started developing the sensation like her legs were tight and she had compression socks. She scheduled visit with PCP and the PCP felt that the swelling could be related to the plavix. She states that last night developed a rash and has worsened into hives.  Per Dr Marjory Lies, he states that the patient should stop plavix and increase the asa back to 325 mg daily.  The other concern is that the gabapentin is not helping the pt. Dr Marjory Lies states that from neuro standpoint he is unsure what else can be offered to the patient. He states that she has already tried and failed several medications and she may be better served with pain management at this point.  She verbalized understanding of the plan

## 2022-11-07 NOTE — Patient Outreach (Signed)
  Care Coordination   Follow Up Visit Note   06/13/2023 Updated note for 11/07/22 Name: DEMA TIMMONS MRN: [id number] DOB: Aug 22, 1990  [name] is a 32 y.o. year old female who sees Milian, Aleen Campi, FNP for primary care. I spoke with  [name] by phone today.  What matters to the patients health and wellness today?  Home health agencies, Personal care services, neurology, counselor services, warm transfer to Managed Medicaid Tailored plan service    Home health-[facility] has already called Marijo and will be visiting today at 2:30 pm  [facility] also called and informed Marijo they will be able to assist if needed  Personal care services Staff outreached on 11/06/22 and will arrive to the home on 11/22/22 for their home visit ([place] [facility])   Neurology- awaiting a return call from present neurology. Voiced appreciation for RN CM assistance to provide the answer for the 10/24/22 ED neurologist consulted -Dr. Willeen Niece    Counseling Marijo confirmed today the PCP has assisted with an expedited order for counseling services   Managed medicaid Tailored plan program- Lanae Boast wrote down [phone] as the number to outreach to the new care coordination team (CM, SW). Voiced understanding that this program will provide a RN CM and SW  Marijo requested to be able to outreach to RN CM if there is not contact from the Tailored plan   Goals Addressed             This Visit's Progress    Patient will receive care coordination services (Managed Medicaid, Home health, PCS) neurological services       Interventions Today    Flowsheet Row Most Recent Value  Chronic Disease   Chronic disease during today's visit Other  [Home health agencies, Personal care services, neurology, counselor services, warm transfer to Managed Medicaid Tailored plan service]  General Interventions   General Interventions Discussed/Reviewed General Interventions Reviewed, Doctor Visits, [employer], Communication with, Level of Care  Doctor Visits Discussed/Reviewed Doctor Visits Reviewed, PCP, Specialist  [Reviewed pcp visit, pending neurology/counseling visits, Tailored plan care coordination services]  PCP/Specialist Visits Compliance with follow-up visit  Communication with RN, Social Work  [employer] with Care coordination team]  Level of Care Personal Care Services  [confirmed Marijo received outreaches from home health agencies, [place] [facility], pcp]  Education Interventions   Education Provided Provided Education  [employer] medicaid tailored plan care coordination services]  Provided Verbal Education On [employer]  Mental Health Interventions   Mental Health Discussed/Reviewed Mental Health Reviewed, Coping Strategies  Nutrition Interventions   Nutrition Discussed/Reviewed Nutrition Discussed, Fluid intake  Pharmacy Interventions   Pharmacy Dicussed/Reviewed Pharmacy Topics Discussed, Affording Medications              SDOH assessments and interventions completed:  No     Care Coordination Interventions:  Yes, provided   Follow up plan: No further intervention required. Available if tailored plan staff not reach    Encounter Outcome:  Pt. Visit Completed   Nadja Lina L. Noelle Penner, RN, BSN, CCM, Care Management Coordinator [phone]

## 2022-11-07 NOTE — Telephone Encounter (Signed)
Traci Hensley [facility]) called has a referral entered without a name on it  [facility], referral can be put in first available rather than a specific doctor to give patient a quicker appointment, send referral through fax [phone]. any questions can contact Traci Hensley at [phone]., Traci Hensley asked to return call to make sure the referral was sent so she can write down her appointment in her books.

## 2022-11-07 NOTE — Addendum Note (Signed)
Addended by: Judi Cong on: 11/07/2022 04:38 PM   Modules accepted: Orders

## 2022-11-07 NOTE — Telephone Encounter (Signed)
The MPOA has called back this morning expressing the urgency in asking to be contacted to discuss pt having an allergic reaction to the blood thinner pt has been put on.  She is asking this be escalated to a high priority to ensure she is given a call back today to discuss how to taper pt off this blood thinner or on to another, please call.

## 2022-11-07 NOTE — Patient Instructions (Addendum)
Visit Information  Thank you for taking time to visit with me today. Please don't hesitate to contact me if I can be of assistance to you.   Following are the goals we discussed today:   Patient will attend MD office visits or virtual visits Patient will receive home health services, personal care services   Patient will use transportation services  Agency Name: [facility] [place].  Address: [address] [place], [place] [address]  Phone: [phone]  Website: www.adtsrc.org Services Offered: Meals on [employer]. Home care, at home assisted  living, volunteer services, [facility],  [employer] and [employer] transportation system.   Agency Name: [facility]  Address: [address] [place], [place] [address]  Phone: [phone]  Website: www.[place].com Services Offered: Transportation for a fee   Goals Addressed             This Visit's Progress    Patient would like follow up on referral for [facility] PT- RN CM care coordination services   Not on track    Interventions Today    Flowsheet Row Most Recent Value  Chronic Disease   Chronic disease during today's visit Other  General Interventions   General Interventions Discussed/Reviewed --  [10/2822 home health PT referrals sent to [facility]]  Doctor Visits Discussed/Reviewed Doctor Visits Discussed, PCP  PCP/Specialist Visits Compliance with follow-up visit  Communication with PCP/Specialists, Social Work  Merchant navy officer to [employer], Chiropractor, CMA, PCP, Home health referral will be sent via EPIC once referral content ready]  Level of Care Personal Care Services  [Discussed with patient, MPOA, SW & PCP]  Exercise Interventions   Exercise Discussed/Reviewed Exercise Discussed, Physical Activity  Physical Activity Discussed/Reviewed --  [Provided transportation info]  Education Interventions    Education Provided Provided Education  Provided Verbal Education On [employer], Other, Development worker, community  Mental Health Interventions   Mental Health Discussed/Reviewed Mental Health Discussed, Coping Strategies  [Discussed ED visit, & her voiced concerns]  Safety Interventions   Safety Discussed/Reviewed Safety Discussed, Fall Risk  [agreed with patient that she is a fall risk at this time]  Advanced Directive Interventions   Advanced Directives Discussed/Reviewed Advanced Directives Discussed, Advanced Care Planning  Bertram Savin is the MPOA]              Our next appointment is by telephone on 11/07/22 at 1030  Please call the care guide team at [phone] if you need to cancel or reschedule your appointment.   If you are experiencing a Mental Health or Behavioral Health Crisis or need someone to talk to, please call the Suicide and Crisis Lifeline: 988 call the [place] National Suicide Prevention Lifeline: 718-768-9919 or TTY: 902-325-1598 TTY (920) 462-0712) to talk to a trained counselor call 1-800-273-TALK (toll free, 24 hour hotline) call the [facility]: ([phone])325-1037 call 911   Patient verbalizes understanding of instructions and care plan provided today and agrees to view in MyChart. Active MyChart status and patient understanding of how to access instructions and care plan via MyChart confirmed with patient.     The patient has been provided with contact information for the care management team and has been advised to call with any health related questions or concerns.   Burgandy Hackworth L. Noelle Penner, RN, BSN, CCM, Care Management Coordinator [phone]

## 2022-11-07 NOTE — Telephone Encounter (Signed)
If minor spotting, then monitor closely and continue aspirin and plavix. If more major bleeding that is not controlled, then may need to sopt anti-platelets and go to ER.  Also, may need to refer to pain mgmt clinic from chronic neck pain / headache issues.  Suanne Marker, MD 11/07/2022, 3:12 PM Certified in Neurology, Neurophysiology and Neuroimaging  [facility] [address], [address] [place], [place] [address] [phone]

## 2022-11-08 ENCOUNTER — Other Ambulatory Visit: Payer: Self-pay | Admitting: Family Medicine

## 2022-11-08 ENCOUNTER — Telehealth: Payer: Self-pay | Admitting: Family Medicine

## 2022-11-08 DIAGNOSIS — M47812 Spondylosis without myelopathy or radiculopathy, cervical region: Secondary | ICD-10-CM

## 2022-11-08 DIAGNOSIS — R29898 Other symptoms and signs involving the musculoskeletal system: Secondary | ICD-10-CM

## 2022-11-08 NOTE — Telephone Encounter (Signed)
PCS  Noted  Copied Sleeved  Original in PCP box Copy front desk folder

## 2022-11-08 NOTE — Telephone Encounter (Signed)
They were asking Shirlee Limerick about a referral to [place] [facility] when she called her and there is no referral placed. Do you need me to enter a referral or do you need to speak with pt first?

## 2022-11-13 ENCOUNTER — Ambulatory Visit (HOSPITAL_COMMUNITY)
Admission: RE | Admit: 2022-11-13 | Discharge: 2022-11-13 | Disposition: A | Discharge status: Home / Self Care | Payer: Medicare Other | Source: Ambulatory Visit | Attending: Family Medicine | Admitting: Family Medicine

## 2022-11-13 DIAGNOSIS — F4310 Post-traumatic stress disorder, unspecified: Secondary | ICD-10-CM | POA: Diagnosis not present

## 2022-11-13 DIAGNOSIS — F319 Bipolar disorder, unspecified: Secondary | ICD-10-CM | POA: Diagnosis not present

## 2022-11-13 DIAGNOSIS — R1031 Right lower quadrant pain: Secondary | ICD-10-CM | POA: Insufficient documentation

## 2022-11-13 DIAGNOSIS — D519 Vitamin B12 deficiency anemia, unspecified: Secondary | ICD-10-CM | POA: Diagnosis not present

## 2022-11-13 DIAGNOSIS — F419 Anxiety disorder, unspecified: Secondary | ICD-10-CM | POA: Diagnosis not present

## 2022-11-13 DIAGNOSIS — I7774 Dissection of vertebral artery: Secondary | ICD-10-CM | POA: Diagnosis not present

## 2022-11-13 DIAGNOSIS — R531 Weakness: Secondary | ICD-10-CM | POA: Diagnosis not present

## 2022-11-14 ENCOUNTER — Ambulatory Visit: Payer: Medicare Other | Attending: Internal Medicine | Admitting: Internal Medicine

## 2022-11-14 ENCOUNTER — Telehealth: Payer: Self-pay | Admitting: Family Medicine

## 2022-11-14 ENCOUNTER — Encounter: Payer: Self-pay | Admitting: Internal Medicine

## 2022-11-14 ENCOUNTER — Other Ambulatory Visit: Payer: Self-pay | Admitting: Family Medicine

## 2022-11-14 VITALS — BP 118/78 | HR 77 | Ht 60.0 in | Wt 118.6 lb

## 2022-11-14 DIAGNOSIS — G909 Disorder of the autonomic nervous system, unspecified: Secondary | ICD-10-CM | POA: Insufficient documentation

## 2022-11-14 DIAGNOSIS — I7774 Dissection of vertebral artery: Secondary | ICD-10-CM

## 2022-11-14 DIAGNOSIS — G894 Chronic pain syndrome: Secondary | ICD-10-CM

## 2022-11-14 NOTE — Telephone Encounter (Signed)
I placed another referral in for Neurology and Pain clinic, Patient is allergic to steroids. I advise patient to Visit ED for acute symptoms so she can receive  immediate care.

## 2022-11-14 NOTE — Progress Notes (Signed)
Cardiology Office Note  Date: 11/14/2022   ID: [name], DOB [birth year], MRN [id number]  PCP:  Rica Records, FNP  Cardiologist:  Christell Constant, MD Electrophysiologist:  None   History of Present Illness: Traci Hensley is a 32 y.o. female known to have spontaneous right vertebral artery dissection, anxiety, history of dysautonomia is here for follow-up visit.  Chronic ongoing symptoms of chest pain, dizziness, palpitations, fatigue and erratic heart rates.  Echo, stress test and event monitor within normal limits. No arrhythmias were noted. She was previously admitted for spontaneous right vertebral artery dissection and was started on DAPT.  However she had allergic reaction to Plavix, broke out in hives and had to stop Plavix.  Currently on baby aspirin 81 mg once daily.  She is also taking meloxicam in addition to aspirin and Plavix.  Currently off meloxicam and waiting on neurology for further recommendations.  She was previously referred to [facility] for management of dysautonomia but she will be out of state for insurance purposes.  Past Medical History:  Diagnosis Date   Agoraphobia    Anxiety    B12 deficiency anemia    Bipolar 1 disorder (HCC)    Chronic abdominal pain    Chronic chest pain    Depression    Dystonia    Unknown which type   PTSD (post-traumatic stress disorder)    Scoliosis    Vertebral artery dissection (HCC) 11/03/2022    Past Surgical History:  Procedure Laterality Date   MOUTH SURGERY      Current Outpatient Medications  Medication Sig Dispense Refill   albuterol (PROVENTIL HFA;VENTOLIN HFA) 108 (90 Base) MCG/ACT inhaler Inhale into the lungs every 6 (six) hours as needed for wheezing or shortness of breath.     aspirin EC 325 MG tablet Take 1 tablet (325 mg total) by mouth daily. 30 tablet 5   baclofen (LIORESAL) 20 MG tablet Take 20 mg by mouth as needed.     clopidogrel (PLAVIX) 75 MG tablet Take  75 mg by mouth daily.     cyanocobalamin (VITAMIN B12) 1000 MCG/ML injection INJECT 1 ML INTRAMUSCULARLY EVERY OTHER WEEK 1 mL 6   cyclobenzaprine (FLEXERIL) 10 MG tablet Take 10 mg by mouth as needed.     EPINEPHrine 0.3 mg/0.3 mL IJ SOAJ injection Inject 0.3 mg into the muscle once. As needed for allergic reaction     ergocalciferol (VITAMIN D2) 1.25 MG (50000 UT) capsule Take 50,000 Units by mouth once a week.     fludrocortisone (FLORINEF) 0.1 MG tablet Take 1 tablet (100 mcg total) by mouth daily. 30 tablet 2   gabapentin (NEURONTIN) 400 MG capsule Take 400 mg by mouth 3 (three) times daily.     meloxicam (MOBIC) 15 MG tablet Take 15 mg by mouth daily.     montelukast (SINGULAIR) 10 MG tablet Take 10 mg by mouth daily.     nitroGLYCERIN (NITROSTAT) 0.4 MG SL tablet Place 1 tablet (0.4 mg total) under the tongue every 5 (five) minutes as needed for chest pain. 25 tablet 12   NONFORMULARY OR COMPOUNDED ITEM Apply 1 application  topically 4 (four) times daily. Katamine/Gabapentin/Baclofen/Lidocaine/Menthol Cream     norethindrone-ethinyl estradiol 1/35 (DASETTA 1/35) tablet Take 1 tablet by mouth daily.     ondansetron (ZOFRAN-ODT) 8 MG disintegrating tablet Take 8 mg by mouth as needed for nausea or vomiting.     SSD 1 % cream Apply topically as needed. 50 g  0   No current facility-administered medications for this visit.   Allergies:  Adhesive [tape], Blue dyes (parenteral), Contrast media [iodinated contrast media], Corn-containing products, Green dyes, Nickel, Omnipaque [iohexol], Onion, Other, Petroleum distillate, Silicone, Bee venom, Cortisporin [bacitra-neomycin-polymyxin-hc], and Yellow jacket venom   Social History: The patient  reports that she has been smoking cigarettes. She started smoking about 10 years ago. She has never used smokeless tobacco. She reports that she does not currently use alcohol. She reports that she does not use drugs.   Family History: The patient's family  history includes Arrhythmia in her mother.   ROS:  Please see the history of present illness. Otherwise, complete review of systems is positive for none  All other systems are reviewed and negative.   Physical Exam: VS:  BP 118/78   Pulse 77   Ht 5' (1.524 m)   Wt 118 lb 9.6 oz (53.8 kg)   SpO2 98%   BMI 23.16 kg/m , BMI Body mass index is 23.16 kg/m.  Wt Readings from Last 3 Encounters:  11/14/22 118 lb 9.6 oz (53.8 kg)  11/06/22 116 lb 0.6 oz (52.6 kg)  10/30/22 116 lb 6.4 oz (52.8 kg)    General: Patient appears comfortable at rest. HEENT: Conjunctiva and lids normal, oropharynx clear with moist mucosa. Neck: Supple, no elevated JVP or carotid bruits, no thyromegaly. Lungs: Clear to auscultation, nonlabored breathing at rest. Cardiac: Regular rate and rhythm, no S3 or significant systolic murmur, no pericardial rub. Abdomen: Soft, nontender, no hepatomegaly, bowel sounds present, no guarding or rebound. Extremities: No pitting edema, distal pulses 2+. Skin: Warm and dry. Musculoskeletal: No kyphosis. Neuropsychiatric: Alert and oriented x3, affect grossly appropriate.  Recent Labwork: 08/27/2022: TSH 3.320 10/24/2022: ALT 12; AST 15; Magnesium 2.0 10/27/2022: BUN 8; Creatinine, Ser 0.57; Potassium 4.1; Sodium 137 11/06/2022: Hemoglobin 12.3; Platelets 294     Component Value Date/Time   CHOL 209 (H) 10/16/2022 0851   TRIG 73 10/16/2022 0851   HDL 52 10/16/2022 0851   CHOLHDL 4.0 10/16/2022 0851   LDLCALC 144 (H) 10/16/2022 0851     Assessment and Plan:  Dysautonomia : Chronic ongoing symptoms of chest pain, palpitations, fatigue and erratic heart rates (patient describes she can feel her heart slowing down and jumping right back in 100s).  She was previously on propranolol but had to discontinue due to hypotension.  Echocardiogram, event monitor (in 2023 and 2024) and NM stress test are unremarkable. She was previously referred to [facility] for management of  dysautonomia but she will be out of state for insurance. Will place referral to electrophysiology, Dr. Sherryl Manges for management of dysautonomia.  Spontaneous right vertebral artery dissection in 8/24: Follows up with neurology, recommended to repeat MRA head + MRA neck in 3 months, DAPT for 6 months. But patient did not tolerate Plavix (had allergic reaction, rash, hives etc. which resolved after discontinuing Plavix).  Currently on baby aspirin 81 mg once daily.  Instructed patient to reach out to neurology for further recommendations.  Anxiety: Follow-up with PCP.    Medication Adjustments/Labs and Tests Ordered: Current medicines are reviewed at length with the patient today.  Concerns regarding medicines are outlined above.    Disposition:  Follow up  6 months  Signed Shanessa Hodak Verne Spurr, MD, 11/14/2022 11:21 AM    [facility] [address] [place], [place], [place] [address]

## 2022-11-14 NOTE — Telephone Encounter (Signed)
Edson Snowball called from [facility] out of the [place] office [phone].  patient is complicated, saw Tenna Child last visit that was going to setup with neurologist has not heard back can setup with pain management clinic because taking Plavix and meloxicam and has lever 2 interaction rash on belly and right side of throat felling swollen. Did not want to stop the Plavix due to history, patient stopped the meloxicam and now in a lot of pain. If possible until get in with neurologist if a steroid could help the patient if this is a possibility.

## 2022-11-14 NOTE — Patient Instructions (Signed)
Medication Instructions:  Your physician recommends that you continue on your current medications as directed. Please refer to the Current Medication list given to you today.   Labwork: None  Testing/Procedures: None  Follow-Up: Your physician recommends that you schedule a follow-up appointment in: 6 months  Any Other Special Instructions Will Be Listed Below (If Applicable). You have been referred to Electrophysiology   If you need a refill on your cardiac medications before your next appointment, please call your pharmacy.

## 2022-11-15 DIAGNOSIS — I7774 Dissection of vertebral artery: Secondary | ICD-10-CM | POA: Diagnosis not present

## 2022-11-15 DIAGNOSIS — F419 Anxiety disorder, unspecified: Secondary | ICD-10-CM | POA: Diagnosis not present

## 2022-11-15 DIAGNOSIS — D519 Vitamin B12 deficiency anemia, unspecified: Secondary | ICD-10-CM | POA: Diagnosis not present

## 2022-11-15 DIAGNOSIS — F319 Bipolar disorder, unspecified: Secondary | ICD-10-CM | POA: Diagnosis not present

## 2022-11-15 DIAGNOSIS — R531 Weakness: Secondary | ICD-10-CM | POA: Diagnosis not present

## 2022-11-15 DIAGNOSIS — F4310 Post-traumatic stress disorder, unspecified: Secondary | ICD-10-CM | POA: Diagnosis not present

## 2022-11-15 NOTE — Telephone Encounter (Signed)
Lvm for shana to return call.

## 2022-11-18 NOTE — Telephone Encounter (Signed)
Lvm for shana.

## 2022-11-19 ENCOUNTER — Other Ambulatory Visit: Payer: Self-pay | Admitting: Neurosurgery

## 2022-11-19 DIAGNOSIS — M329 Systemic lupus erythematosus, unspecified: Secondary | ICD-10-CM

## 2022-11-19 DIAGNOSIS — M5440 Lumbago with sciatica, unspecified side: Secondary | ICD-10-CM | POA: Diagnosis not present

## 2022-11-19 DIAGNOSIS — M5430 Sciatica, unspecified side: Secondary | ICD-10-CM

## 2022-11-20 DIAGNOSIS — F4310 Post-traumatic stress disorder, unspecified: Secondary | ICD-10-CM | POA: Diagnosis not present

## 2022-11-20 DIAGNOSIS — F419 Anxiety disorder, unspecified: Secondary | ICD-10-CM | POA: Diagnosis not present

## 2022-11-20 DIAGNOSIS — R531 Weakness: Secondary | ICD-10-CM | POA: Diagnosis not present

## 2022-11-20 DIAGNOSIS — I7774 Dissection of vertebral artery: Secondary | ICD-10-CM | POA: Diagnosis not present

## 2022-11-20 DIAGNOSIS — D519 Vitamin B12 deficiency anemia, unspecified: Secondary | ICD-10-CM | POA: Diagnosis not present

## 2022-11-20 DIAGNOSIS — F319 Bipolar disorder, unspecified: Secondary | ICD-10-CM | POA: Diagnosis not present

## 2022-11-21 ENCOUNTER — Telehealth: Payer: Self-pay | Admitting: Family Medicine

## 2022-11-21 DIAGNOSIS — F419 Anxiety disorder, unspecified: Secondary | ICD-10-CM | POA: Diagnosis not present

## 2022-11-21 DIAGNOSIS — F319 Bipolar disorder, unspecified: Secondary | ICD-10-CM | POA: Diagnosis not present

## 2022-11-21 DIAGNOSIS — I7774 Dissection of vertebral artery: Secondary | ICD-10-CM | POA: Diagnosis not present

## 2022-11-21 DIAGNOSIS — D519 Vitamin B12 deficiency anemia, unspecified: Secondary | ICD-10-CM | POA: Diagnosis not present

## 2022-11-21 DIAGNOSIS — R531 Weakness: Secondary | ICD-10-CM | POA: Diagnosis not present

## 2022-11-21 DIAGNOSIS — F4310 Post-traumatic stress disorder, unspecified: Secondary | ICD-10-CM | POA: Diagnosis not present

## 2022-11-21 NOTE — Telephone Encounter (Signed)
Traci Hensley called in on patient behalf  Pt has been taken off Meloxicam and is now having a hard time emptying balder and is in pain. Urology will not send patient anything in to help  Wants to see if provider can help  Call back [facility] [phone]

## 2022-11-22 ENCOUNTER — Telehealth: Payer: Self-pay | Admitting: Family Medicine

## 2022-11-22 DIAGNOSIS — F319 Bipolar disorder, unspecified: Secondary | ICD-10-CM | POA: Diagnosis not present

## 2022-11-22 DIAGNOSIS — D519 Vitamin B12 deficiency anemia, unspecified: Secondary | ICD-10-CM | POA: Diagnosis not present

## 2022-11-22 DIAGNOSIS — R531 Weakness: Secondary | ICD-10-CM | POA: Diagnosis not present

## 2022-11-22 DIAGNOSIS — F4310 Post-traumatic stress disorder, unspecified: Secondary | ICD-10-CM | POA: Diagnosis not present

## 2022-11-22 DIAGNOSIS — F419 Anxiety disorder, unspecified: Secondary | ICD-10-CM | POA: Diagnosis not present

## 2022-11-22 DIAGNOSIS — I7774 Dissection of vertebral artery: Secondary | ICD-10-CM | POA: Diagnosis not present

## 2022-11-22 NOTE — Telephone Encounter (Signed)
POA called. Nurse from physical therapy that did patient assessment.  Flag medication has level 2 interaction with plavix and meloxicam. Does patient need to .keep on plavix and take off the meloxicam?  Per patient took on her self she  stop the meloxicam interaction with the plavix. Now patient is a lot of swelling since she has stopped the meloxicam. Patient will need a low dose steroids to get infection down to get the swelling down.   Level 2 interaction vs  reaction to the plavix   Call back # [phone] to need to describe alternative medication that will go with the plavix.patient will run out on the plavix on the 09.19.2024. must have an adjustment by this date since she will be running out of her plavix. Asked for a call back today if at all possible.

## 2022-11-25 ENCOUNTER — Telehealth: Payer: Self-pay | Admitting: Family Medicine

## 2022-11-25 DIAGNOSIS — F4310 Post-traumatic stress disorder, unspecified: Secondary | ICD-10-CM | POA: Diagnosis not present

## 2022-11-25 DIAGNOSIS — F319 Bipolar disorder, unspecified: Secondary | ICD-10-CM | POA: Diagnosis not present

## 2022-11-25 DIAGNOSIS — R531 Weakness: Secondary | ICD-10-CM | POA: Diagnosis not present

## 2022-11-25 DIAGNOSIS — I7774 Dissection of vertebral artery: Secondary | ICD-10-CM | POA: Diagnosis not present

## 2022-11-25 DIAGNOSIS — D519 Vitamin B12 deficiency anemia, unspecified: Secondary | ICD-10-CM | POA: Diagnosis not present

## 2022-11-25 DIAGNOSIS — F419 Anxiety disorder, unspecified: Secondary | ICD-10-CM | POA: Diagnosis not present

## 2022-11-25 NOTE — Telephone Encounter (Signed)
Spoke with POA says they spoke with somebody on call with neurology on sat night- recommends no Plavix and keep Meloxicam or keep the Meloxicam and start on another anticoagulant. Please advise Thank you

## 2022-11-25 NOTE — Telephone Encounter (Signed)
Spoke with POA attempting to get pt scheduled with Arlys John through referral- she informed me that patient must see a female BH specialist due to her past, requesting one to be sent to an office with a female provider. Please advise Thank you

## 2022-11-26 NOTE — Telephone Encounter (Signed)
POA called back and said will give Arlys John with [facility] in our a try.

## 2022-11-26 NOTE — Telephone Encounter (Signed)
Correction:  patient would like to try Arlys John in our office for the [facility] referral.

## 2022-11-26 NOTE — Telephone Encounter (Signed)
POA arijo Orlene Erm called asking for a return call, has not heard anything back from any provider. Call back # [phone].

## 2022-11-26 NOTE — Telephone Encounter (Signed)
error 

## 2022-11-27 NOTE — Telephone Encounter (Signed)
Is there anything you can send her in for this POA is asking?

## 2022-11-27 NOTE — Telephone Encounter (Signed)
What is POA?

## 2022-11-28 ENCOUNTER — Other Ambulatory Visit: Payer: Self-pay | Admitting: Family Medicine

## 2022-11-28 DIAGNOSIS — D519 Vitamin B12 deficiency anemia, unspecified: Secondary | ICD-10-CM | POA: Diagnosis not present

## 2022-11-28 DIAGNOSIS — F419 Anxiety disorder, unspecified: Secondary | ICD-10-CM | POA: Diagnosis not present

## 2022-11-28 DIAGNOSIS — R531 Weakness: Secondary | ICD-10-CM | POA: Diagnosis not present

## 2022-11-28 DIAGNOSIS — F319 Bipolar disorder, unspecified: Secondary | ICD-10-CM | POA: Diagnosis not present

## 2022-11-28 DIAGNOSIS — F4310 Post-traumatic stress disorder, unspecified: Secondary | ICD-10-CM | POA: Diagnosis not present

## 2022-11-28 DIAGNOSIS — I7774 Dissection of vertebral artery: Secondary | ICD-10-CM | POA: Diagnosis not present

## 2022-11-28 NOTE — Telephone Encounter (Signed)
It is her power of attorney

## 2022-11-28 NOTE — Telephone Encounter (Signed)
Anything related to her neurology diagnosis and medication managment, she will need to discuss with her neurologist

## 2022-11-29 ENCOUNTER — Other Ambulatory Visit: Payer: Self-pay | Admitting: Family Medicine

## 2022-11-29 ENCOUNTER — Encounter: Payer: Medicare Other | Admitting: Family Medicine

## 2022-11-29 ENCOUNTER — Encounter: Payer: Self-pay | Admitting: Family Medicine

## 2022-11-29 MED ORDER — DEXAMETHASONE 1 MG PO TABS: 1.0000 mg | ORAL_TABLET | Freq: Every day | ORAL | 1 refills | Status: DC

## 2022-11-29 NOTE — Telephone Encounter (Signed)
Scheduled for video visit at 2pm.

## 2022-11-29 NOTE — Progress Notes (Signed)
Spoke to patient on the phone no video/ visit required This encounter was created in error - please disregard.

## 2022-12-04 DIAGNOSIS — I7774 Dissection of vertebral artery: Secondary | ICD-10-CM | POA: Diagnosis not present

## 2022-12-04 DIAGNOSIS — F319 Bipolar disorder, unspecified: Secondary | ICD-10-CM | POA: Diagnosis not present

## 2022-12-04 DIAGNOSIS — R531 Weakness: Secondary | ICD-10-CM | POA: Diagnosis not present

## 2022-12-04 DIAGNOSIS — F4310 Post-traumatic stress disorder, unspecified: Secondary | ICD-10-CM | POA: Diagnosis not present

## 2022-12-04 DIAGNOSIS — D519 Vitamin B12 deficiency anemia, unspecified: Secondary | ICD-10-CM | POA: Diagnosis not present

## 2022-12-04 DIAGNOSIS — F419 Anxiety disorder, unspecified: Secondary | ICD-10-CM | POA: Diagnosis not present

## 2022-12-05 ENCOUNTER — Telehealth: Payer: Self-pay | Admitting: Family Medicine

## 2022-12-05 ENCOUNTER — Other Ambulatory Visit: Payer: Self-pay | Admitting: Family Medicine

## 2022-12-05 DIAGNOSIS — R531 Weakness: Secondary | ICD-10-CM | POA: Diagnosis not present

## 2022-12-05 DIAGNOSIS — I7774 Dissection of vertebral artery: Secondary | ICD-10-CM | POA: Diagnosis not present

## 2022-12-05 DIAGNOSIS — F4310 Post-traumatic stress disorder, unspecified: Secondary | ICD-10-CM | POA: Diagnosis not present

## 2022-12-05 DIAGNOSIS — F419 Anxiety disorder, unspecified: Secondary | ICD-10-CM | POA: Diagnosis not present

## 2022-12-05 DIAGNOSIS — D519 Vitamin B12 deficiency anemia, unspecified: Secondary | ICD-10-CM | POA: Diagnosis not present

## 2022-12-05 DIAGNOSIS — G894 Chronic pain syndrome: Secondary | ICD-10-CM

## 2022-12-05 DIAGNOSIS — F319 Bipolar disorder, unspecified: Secondary | ICD-10-CM | POA: Diagnosis not present

## 2022-12-05 NOTE — Telephone Encounter (Signed)
The pt has been swelling and her joints hurt so pad- she just completed a prednisone taper recently and after multiple failed returned calls she was able to get Traci Hensley to prescribe 1mg  of prednisone daily. Traci Hensley's POA and caregiver called back asking for a slight increase in the prednisone days ago which was recommended by physical therapy and the after hours nurse and Tenna Child recommends the ER and the caregiver is so frustrated and states this is very unprofessional to be so nonresponsive and dismissive. Wants to speak to the office manager tomorrow to discuss Traci Hensley's behavior. [phone].

## 2022-12-05 NOTE — Telephone Encounter (Signed)
Was calling back the POA but they are not wanting to speak to nurse, now wants to speak directly with provider.

## 2022-12-05 NOTE — Telephone Encounter (Signed)
Please inform patient to visit ED as I can not increase Prednisone 1 mg rapidly, and we are limited on pain medication due to her medication interaction, allergies and recent neurology  diagnosis  urgent referral placed to pain management, patient will need higher level of care for her pain.

## 2022-12-05 NOTE — Telephone Encounter (Signed)
POA called back in regard to previous tele messages.  Wants to heard from provider directly, feel as is provider is avoiding the situation at hand.

## 2022-12-05 NOTE — Telephone Encounter (Signed)
Patient POA is calling need to speak with Iliana ASAP regarding pt Prednisone- 1 mg NOT WORKING. Please call Fredna Dow back asap [phone] Thank you

## 2022-12-06 ENCOUNTER — Other Ambulatory Visit: Payer: Self-pay | Admitting: Family Medicine

## 2022-12-07 ENCOUNTER — Other Ambulatory Visit: Payer: Self-pay | Admitting: Family Medicine

## 2022-12-07 DIAGNOSIS — Z7952 Long term (current) use of systemic steroids: Secondary | ICD-10-CM | POA: Diagnosis not present

## 2022-12-07 DIAGNOSIS — Z7982 Long term (current) use of aspirin: Secondary | ICD-10-CM | POA: Diagnosis not present

## 2022-12-07 DIAGNOSIS — M419 Scoliosis, unspecified: Secondary | ICD-10-CM | POA: Diagnosis not present

## 2022-12-07 DIAGNOSIS — D519 Vitamin B12 deficiency anemia, unspecified: Secondary | ICD-10-CM | POA: Diagnosis not present

## 2022-12-07 DIAGNOSIS — R079 Chest pain, unspecified: Secondary | ICD-10-CM | POA: Diagnosis not present

## 2022-12-07 DIAGNOSIS — F4000 Agoraphobia, unspecified: Secondary | ICD-10-CM | POA: Diagnosis not present

## 2022-12-07 DIAGNOSIS — Z791 Long term (current) use of non-steroidal anti-inflammatories (NSAID): Secondary | ICD-10-CM | POA: Diagnosis not present

## 2022-12-07 DIAGNOSIS — F319 Bipolar disorder, unspecified: Secondary | ICD-10-CM | POA: Diagnosis not present

## 2022-12-07 DIAGNOSIS — R531 Weakness: Secondary | ICD-10-CM | POA: Diagnosis not present

## 2022-12-07 DIAGNOSIS — N920 Excessive and frequent menstruation with regular cycle: Secondary | ICD-10-CM | POA: Diagnosis not present

## 2022-12-07 DIAGNOSIS — Z7902 Long term (current) use of antithrombotics/antiplatelets: Secondary | ICD-10-CM | POA: Diagnosis not present

## 2022-12-07 DIAGNOSIS — F419 Anxiety disorder, unspecified: Secondary | ICD-10-CM | POA: Diagnosis not present

## 2022-12-07 DIAGNOSIS — G249 Dystonia, unspecified: Secondary | ICD-10-CM | POA: Diagnosis not present

## 2022-12-07 DIAGNOSIS — G8929 Other chronic pain: Secondary | ICD-10-CM | POA: Diagnosis not present

## 2022-12-07 DIAGNOSIS — I7774 Dissection of vertebral artery: Secondary | ICD-10-CM | POA: Diagnosis not present

## 2022-12-07 DIAGNOSIS — Z556 Problems related to health literacy: Secondary | ICD-10-CM | POA: Diagnosis not present

## 2022-12-07 DIAGNOSIS — I10 Essential (primary) hypertension: Secondary | ICD-10-CM | POA: Diagnosis not present

## 2022-12-07 DIAGNOSIS — R109 Unspecified abdominal pain: Secondary | ICD-10-CM | POA: Diagnosis not present

## 2022-12-07 DIAGNOSIS — F4310 Post-traumatic stress disorder, unspecified: Secondary | ICD-10-CM | POA: Diagnosis not present

## 2022-12-09 DIAGNOSIS — F319 Bipolar disorder, unspecified: Secondary | ICD-10-CM | POA: Diagnosis not present

## 2022-12-09 DIAGNOSIS — R531 Weakness: Secondary | ICD-10-CM | POA: Diagnosis not present

## 2022-12-09 DIAGNOSIS — I7774 Dissection of vertebral artery: Secondary | ICD-10-CM | POA: Diagnosis not present

## 2022-12-09 DIAGNOSIS — D519 Vitamin B12 deficiency anemia, unspecified: Secondary | ICD-10-CM | POA: Diagnosis not present

## 2022-12-09 DIAGNOSIS — F419 Anxiety disorder, unspecified: Secondary | ICD-10-CM | POA: Diagnosis not present

## 2022-12-09 DIAGNOSIS — F4310 Post-traumatic stress disorder, unspecified: Secondary | ICD-10-CM | POA: Diagnosis not present

## 2022-12-12 DIAGNOSIS — D519 Vitamin B12 deficiency anemia, unspecified: Secondary | ICD-10-CM | POA: Diagnosis not present

## 2022-12-12 DIAGNOSIS — I7774 Dissection of vertebral artery: Secondary | ICD-10-CM | POA: Diagnosis not present

## 2022-12-12 DIAGNOSIS — R531 Weakness: Secondary | ICD-10-CM | POA: Diagnosis not present

## 2022-12-12 DIAGNOSIS — F4310 Post-traumatic stress disorder, unspecified: Secondary | ICD-10-CM | POA: Diagnosis not present

## 2022-12-12 DIAGNOSIS — F419 Anxiety disorder, unspecified: Secondary | ICD-10-CM | POA: Diagnosis not present

## 2022-12-12 DIAGNOSIS — F319 Bipolar disorder, unspecified: Secondary | ICD-10-CM | POA: Diagnosis not present

## 2022-12-12 NOTE — Telephone Encounter (Signed)
Patient can establish care with another [facility] provider. Current PCP leaving practice.

## 2022-12-12 NOTE — Telephone Encounter (Signed)
Spoke with pt POA, will consult with both MD pt needs and see which one will be able to take on pt

## 2022-12-12 NOTE — Telephone Encounter (Signed)
Called [phone].  No voicemail or no answer

## 2022-12-14 ENCOUNTER — Other Ambulatory Visit: Payer: Self-pay | Admitting: Family Medicine

## 2022-12-18 DIAGNOSIS — D519 Vitamin B12 deficiency anemia, unspecified: Secondary | ICD-10-CM | POA: Diagnosis not present

## 2022-12-18 DIAGNOSIS — R531 Weakness: Secondary | ICD-10-CM | POA: Diagnosis not present

## 2022-12-18 DIAGNOSIS — I7774 Dissection of vertebral artery: Secondary | ICD-10-CM | POA: Diagnosis not present

## 2022-12-18 DIAGNOSIS — F419 Anxiety disorder, unspecified: Secondary | ICD-10-CM | POA: Diagnosis not present

## 2022-12-18 DIAGNOSIS — F4310 Post-traumatic stress disorder, unspecified: Secondary | ICD-10-CM | POA: Diagnosis not present

## 2022-12-18 DIAGNOSIS — F319 Bipolar disorder, unspecified: Secondary | ICD-10-CM | POA: Diagnosis not present

## 2022-12-18 NOTE — Telephone Encounter (Addendum)
POA has called in office again in regard to prednisone issue and office response issues   Patient is in need of prednisone increase orders .  Wants a call back in regard. Has not heard anything back from office in regard to any of the needs that she has been calling.  Wants to know who patient weill be seeing instead of Iliana   Is expecting to have a call back from Research officer, political party as well.

## 2022-12-20 NOTE — Telephone Encounter (Signed)
Spoke with POA . she does not want to wait for schedule opening to have pt. Seen to increase medication. She has requested medical records to be sent to new PCP.

## 2022-12-23 DIAGNOSIS — R531 Weakness: Secondary | ICD-10-CM | POA: Diagnosis not present

## 2022-12-23 DIAGNOSIS — D519 Vitamin B12 deficiency anemia, unspecified: Secondary | ICD-10-CM | POA: Diagnosis not present

## 2022-12-23 DIAGNOSIS — F419 Anxiety disorder, unspecified: Secondary | ICD-10-CM | POA: Diagnosis not present

## 2022-12-23 DIAGNOSIS — F4310 Post-traumatic stress disorder, unspecified: Secondary | ICD-10-CM | POA: Diagnosis not present

## 2022-12-23 DIAGNOSIS — I7774 Dissection of vertebral artery: Secondary | ICD-10-CM | POA: Diagnosis not present

## 2022-12-23 DIAGNOSIS — F319 Bipolar disorder, unspecified: Secondary | ICD-10-CM | POA: Diagnosis not present

## 2022-12-25 DIAGNOSIS — R531 Weakness: Secondary | ICD-10-CM | POA: Diagnosis not present

## 2022-12-25 DIAGNOSIS — I7774 Dissection of vertebral artery: Secondary | ICD-10-CM | POA: Diagnosis not present

## 2022-12-25 DIAGNOSIS — F4310 Post-traumatic stress disorder, unspecified: Secondary | ICD-10-CM | POA: Diagnosis not present

## 2022-12-25 DIAGNOSIS — D519 Vitamin B12 deficiency anemia, unspecified: Secondary | ICD-10-CM | POA: Diagnosis not present

## 2022-12-25 DIAGNOSIS — F319 Bipolar disorder, unspecified: Secondary | ICD-10-CM | POA: Diagnosis not present

## 2022-12-25 DIAGNOSIS — F419 Anxiety disorder, unspecified: Secondary | ICD-10-CM | POA: Diagnosis not present

## 2022-12-28 ENCOUNTER — Ambulatory Visit
Admission: RE | Admit: 2022-12-28 | Discharge: 2022-12-28 | Disposition: A | Discharge status: Home / Self Care | Payer: Medicare Other | Source: Ambulatory Visit | Attending: Neurosurgery | Admitting: Neurosurgery

## 2022-12-28 DIAGNOSIS — M50222 Other cervical disc displacement at C5-C6 level: Secondary | ICD-10-CM | POA: Diagnosis not present

## 2022-12-28 DIAGNOSIS — M329 Systemic lupus erythematosus, unspecified: Secondary | ICD-10-CM

## 2022-12-28 DIAGNOSIS — M549 Dorsalgia, unspecified: Secondary | ICD-10-CM | POA: Diagnosis not present

## 2022-12-28 DIAGNOSIS — M5430 Sciatica, unspecified side: Secondary | ICD-10-CM

## 2022-12-28 DIAGNOSIS — M50221 Other cervical disc displacement at C4-C5 level: Secondary | ICD-10-CM | POA: Diagnosis not present

## 2022-12-28 DIAGNOSIS — M546 Pain in thoracic spine: Secondary | ICD-10-CM | POA: Diagnosis not present

## 2022-12-28 DIAGNOSIS — M50223 Other cervical disc displacement at C6-C7 level: Secondary | ICD-10-CM | POA: Diagnosis not present

## 2022-12-30 DIAGNOSIS — G458 Other transient cerebral ischemic attacks and related syndromes: Secondary | ICD-10-CM | POA: Diagnosis not present

## 2022-12-30 DIAGNOSIS — Z1329 Encounter for screening for other suspected endocrine disorder: Secondary | ICD-10-CM | POA: Diagnosis not present

## 2022-12-30 DIAGNOSIS — I951 Orthostatic hypotension: Secondary | ICD-10-CM | POA: Diagnosis not present

## 2022-12-30 DIAGNOSIS — J4531 Mild persistent asthma with (acute) exacerbation: Secondary | ICD-10-CM | POA: Diagnosis not present

## 2022-12-30 DIAGNOSIS — M160 Bilateral primary osteoarthritis of hip: Secondary | ICD-10-CM | POA: Diagnosis not present

## 2023-01-01 DIAGNOSIS — I7774 Dissection of vertebral artery: Secondary | ICD-10-CM | POA: Diagnosis not present

## 2023-01-01 DIAGNOSIS — D519 Vitamin B12 deficiency anemia, unspecified: Secondary | ICD-10-CM | POA: Diagnosis not present

## 2023-01-01 DIAGNOSIS — F319 Bipolar disorder, unspecified: Secondary | ICD-10-CM | POA: Diagnosis not present

## 2023-01-01 DIAGNOSIS — F419 Anxiety disorder, unspecified: Secondary | ICD-10-CM | POA: Diagnosis not present

## 2023-01-01 DIAGNOSIS — F4310 Post-traumatic stress disorder, unspecified: Secondary | ICD-10-CM | POA: Diagnosis not present

## 2023-01-01 DIAGNOSIS — R531 Weakness: Secondary | ICD-10-CM | POA: Diagnosis not present

## 2023-01-03 DIAGNOSIS — I7774 Dissection of vertebral artery: Secondary | ICD-10-CM | POA: Diagnosis not present

## 2023-01-03 DIAGNOSIS — R531 Weakness: Secondary | ICD-10-CM | POA: Diagnosis not present

## 2023-01-03 DIAGNOSIS — F319 Bipolar disorder, unspecified: Secondary | ICD-10-CM | POA: Diagnosis not present

## 2023-01-03 DIAGNOSIS — D519 Vitamin B12 deficiency anemia, unspecified: Secondary | ICD-10-CM | POA: Diagnosis not present

## 2023-01-03 DIAGNOSIS — F419 Anxiety disorder, unspecified: Secondary | ICD-10-CM | POA: Diagnosis not present

## 2023-01-03 DIAGNOSIS — F4310 Post-traumatic stress disorder, unspecified: Secondary | ICD-10-CM | POA: Diagnosis not present

## 2023-01-06 DIAGNOSIS — Z7902 Long term (current) use of antithrombotics/antiplatelets: Secondary | ICD-10-CM | POA: Diagnosis not present

## 2023-01-06 DIAGNOSIS — F4310 Post-traumatic stress disorder, unspecified: Secondary | ICD-10-CM | POA: Diagnosis not present

## 2023-01-06 DIAGNOSIS — Z9981 Dependence on supplemental oxygen: Secondary | ICD-10-CM | POA: Diagnosis not present

## 2023-01-06 DIAGNOSIS — Z7951 Long term (current) use of inhaled steroids: Secondary | ICD-10-CM | POA: Diagnosis not present

## 2023-01-06 DIAGNOSIS — D519 Vitamin B12 deficiency anemia, unspecified: Secondary | ICD-10-CM | POA: Diagnosis not present

## 2023-01-06 DIAGNOSIS — N920 Excessive and frequent menstruation with regular cycle: Secondary | ICD-10-CM | POA: Diagnosis not present

## 2023-01-06 DIAGNOSIS — I10 Essential (primary) hypertension: Secondary | ICD-10-CM | POA: Diagnosis not present

## 2023-01-06 DIAGNOSIS — R109 Unspecified abdominal pain: Secondary | ICD-10-CM | POA: Diagnosis not present

## 2023-01-06 DIAGNOSIS — M419 Scoliosis, unspecified: Secondary | ICD-10-CM | POA: Diagnosis not present

## 2023-01-06 DIAGNOSIS — R079 Chest pain, unspecified: Secondary | ICD-10-CM | POA: Diagnosis not present

## 2023-01-06 DIAGNOSIS — G249 Dystonia, unspecified: Secondary | ICD-10-CM | POA: Diagnosis not present

## 2023-01-06 DIAGNOSIS — R531 Weakness: Secondary | ICD-10-CM | POA: Diagnosis not present

## 2023-01-06 DIAGNOSIS — F419 Anxiety disorder, unspecified: Secondary | ICD-10-CM | POA: Diagnosis not present

## 2023-01-06 DIAGNOSIS — F319 Bipolar disorder, unspecified: Secondary | ICD-10-CM | POA: Diagnosis not present

## 2023-01-06 DIAGNOSIS — Z791 Long term (current) use of non-steroidal anti-inflammatories (NSAID): Secondary | ICD-10-CM | POA: Diagnosis not present

## 2023-01-06 DIAGNOSIS — I7774 Dissection of vertebral artery: Secondary | ICD-10-CM | POA: Diagnosis not present

## 2023-01-06 DIAGNOSIS — Z556 Problems related to health literacy: Secondary | ICD-10-CM | POA: Diagnosis not present

## 2023-01-06 DIAGNOSIS — F4000 Agoraphobia, unspecified: Secondary | ICD-10-CM | POA: Diagnosis not present

## 2023-01-06 DIAGNOSIS — G8929 Other chronic pain: Secondary | ICD-10-CM | POA: Diagnosis not present

## 2023-01-06 DIAGNOSIS — S81801D Unspecified open wound, right lower leg, subsequent encounter: Secondary | ICD-10-CM | POA: Diagnosis not present

## 2023-01-08 DIAGNOSIS — D519 Vitamin B12 deficiency anemia, unspecified: Secondary | ICD-10-CM | POA: Diagnosis not present

## 2023-01-08 DIAGNOSIS — I7774 Dissection of vertebral artery: Secondary | ICD-10-CM | POA: Diagnosis not present

## 2023-01-08 DIAGNOSIS — R531 Weakness: Secondary | ICD-10-CM | POA: Diagnosis not present

## 2023-01-08 DIAGNOSIS — F4310 Post-traumatic stress disorder, unspecified: Secondary | ICD-10-CM | POA: Diagnosis not present

## 2023-01-08 DIAGNOSIS — F319 Bipolar disorder, unspecified: Secondary | ICD-10-CM | POA: Diagnosis not present

## 2023-01-08 DIAGNOSIS — F419 Anxiety disorder, unspecified: Secondary | ICD-10-CM | POA: Diagnosis not present

## 2023-01-10 ENCOUNTER — Ambulatory Visit: Payer: Medicare Other | Admitting: Internal Medicine

## 2023-01-10 DIAGNOSIS — F419 Anxiety disorder, unspecified: Secondary | ICD-10-CM | POA: Diagnosis not present

## 2023-01-10 DIAGNOSIS — R531 Weakness: Secondary | ICD-10-CM | POA: Diagnosis not present

## 2023-01-10 DIAGNOSIS — I7774 Dissection of vertebral artery: Secondary | ICD-10-CM | POA: Diagnosis not present

## 2023-01-10 DIAGNOSIS — F4310 Post-traumatic stress disorder, unspecified: Secondary | ICD-10-CM | POA: Diagnosis not present

## 2023-01-10 DIAGNOSIS — D519 Vitamin B12 deficiency anemia, unspecified: Secondary | ICD-10-CM | POA: Diagnosis not present

## 2023-01-10 DIAGNOSIS — F319 Bipolar disorder, unspecified: Secondary | ICD-10-CM | POA: Diagnosis not present

## 2023-01-13 ENCOUNTER — Other Ambulatory Visit: Payer: Self-pay | Admitting: Family Medicine

## 2023-01-14 DIAGNOSIS — I7774 Dissection of vertebral artery: Secondary | ICD-10-CM | POA: Diagnosis not present

## 2023-01-14 DIAGNOSIS — F319 Bipolar disorder, unspecified: Secondary | ICD-10-CM | POA: Diagnosis not present

## 2023-01-14 DIAGNOSIS — F419 Anxiety disorder, unspecified: Secondary | ICD-10-CM | POA: Diagnosis not present

## 2023-01-14 DIAGNOSIS — R531 Weakness: Secondary | ICD-10-CM | POA: Diagnosis not present

## 2023-01-14 DIAGNOSIS — D519 Vitamin B12 deficiency anemia, unspecified: Secondary | ICD-10-CM | POA: Diagnosis not present

## 2023-01-14 DIAGNOSIS — F4310 Post-traumatic stress disorder, unspecified: Secondary | ICD-10-CM | POA: Diagnosis not present

## 2023-01-15 ENCOUNTER — Ambulatory Visit: Payer: Self-pay | Admitting: Family Medicine

## 2023-01-15 DIAGNOSIS — I7774 Dissection of vertebral artery: Secondary | ICD-10-CM | POA: Diagnosis not present

## 2023-01-15 DIAGNOSIS — F319 Bipolar disorder, unspecified: Secondary | ICD-10-CM | POA: Diagnosis not present

## 2023-01-15 DIAGNOSIS — R531 Weakness: Secondary | ICD-10-CM | POA: Diagnosis not present

## 2023-01-15 DIAGNOSIS — F419 Anxiety disorder, unspecified: Secondary | ICD-10-CM | POA: Diagnosis not present

## 2023-01-15 DIAGNOSIS — F4310 Post-traumatic stress disorder, unspecified: Secondary | ICD-10-CM | POA: Diagnosis not present

## 2023-01-15 DIAGNOSIS — D519 Vitamin B12 deficiency anemia, unspecified: Secondary | ICD-10-CM | POA: Diagnosis not present

## 2023-01-18 ENCOUNTER — Other Ambulatory Visit: Payer: Self-pay

## 2023-01-18 ENCOUNTER — Encounter (HOSPITAL_COMMUNITY): Payer: Self-pay

## 2023-01-18 ENCOUNTER — Emergency Department (HOSPITAL_COMMUNITY)
Admission: EM | Admit: 2023-01-18 | Discharge: 2023-01-19 | Disposition: A | Discharge status: Home / Self Care | Payer: Medicare Other | Attending: Emergency Medicine | Admitting: Emergency Medicine

## 2023-01-18 ENCOUNTER — Emergency Department (HOSPITAL_COMMUNITY): Payer: Medicare Other

## 2023-01-18 DIAGNOSIS — Z7982 Long term (current) use of aspirin: Secondary | ICD-10-CM | POA: Diagnosis not present

## 2023-01-18 DIAGNOSIS — G8929 Other chronic pain: Secondary | ICD-10-CM | POA: Diagnosis not present

## 2023-01-18 DIAGNOSIS — H538 Other visual disturbances: Secondary | ICD-10-CM | POA: Diagnosis not present

## 2023-01-18 DIAGNOSIS — I7774 Dissection of vertebral artery: Secondary | ICD-10-CM | POA: Insufficient documentation

## 2023-01-18 DIAGNOSIS — Z7901 Long term (current) use of anticoagulants: Secondary | ICD-10-CM | POA: Insufficient documentation

## 2023-01-18 DIAGNOSIS — Q048 Other specified congenital malformations of brain: Secondary | ICD-10-CM | POA: Diagnosis not present

## 2023-01-18 DIAGNOSIS — R29818 Other symptoms and signs involving the nervous system: Secondary | ICD-10-CM | POA: Diagnosis not present

## 2023-01-18 DIAGNOSIS — R519 Headache, unspecified: Secondary | ICD-10-CM | POA: Insufficient documentation

## 2023-01-18 LAB — CBC WITH DIFFERENTIAL/PLATELET
Abs Immature Granulocytes: 0.03 10*3/uL (ref 0.00–0.07)
Basophils Absolute: 0 10*3/uL (ref 0.0–0.1)
Basophils Relative: 0 %
Eosinophils Absolute: 0.1 10*3/uL (ref 0.0–0.5)
Eosinophils Relative: 1 %
HCT: 45.2 % (ref 36.0–46.0)
Hemoglobin: 15 g/dL (ref 12.0–15.0)
Immature Granulocytes: 0 %
Lymphocytes Relative: 20 %
Lymphs Abs: 2 10*3/uL (ref 0.7–4.0)
MCH: 30.9 pg (ref 26.0–34.0)
MCHC: 33.2 g/dL (ref 30.0–36.0)
MCV: 93 fL (ref 80.0–100.0)
Monocytes Absolute: 0.5 10*3/uL (ref 0.1–1.0)
Monocytes Relative: 5 %
Neutro Abs: 7.3 10*3/uL (ref 1.7–7.7)
Neutrophils Relative %: 74 %
Platelets: 295 10*3/uL (ref 150–400)
RBC: 4.86 MIL/uL (ref 3.87–5.11)
RDW: 11.9 % (ref 11.5–15.5)
WBC: 9.9 10*3/uL (ref 4.0–10.5)
nRBC: 0 % (ref 0.0–0.2)

## 2023-01-18 LAB — COMPREHENSIVE METABOLIC PANEL
ALT: 14 U/L (ref 0–44)
AST: 17 U/L (ref 15–41)
Albumin: 4.7 g/dL (ref 3.5–5.0)
Alkaline Phosphatase: 51 U/L (ref 38–126)
Anion gap: 12 (ref 5–15)
BUN: 14 mg/dL (ref 6–20)
CO2: 23 mmol/L (ref 22–32)
Calcium: 9.1 mg/dL (ref 8.9–10.3)
Chloride: 102 mmol/L (ref 98–111)
Creatinine, Ser: 0.56 mg/dL (ref 0.44–1.00)
GFR, Estimated: >60 mL/min (ref >60)
Glucose, Bld: 133 mg/dL — ABNORMAL HIGH (ref 70–99)
Potassium: 3.9 mmol/L (ref 3.5–5.1)
Sodium: 137 mmol/L (ref 135–145)
Total Bilirubin: 0.4 mg/dL (ref <1.2)
Total Protein: 8 g/dL (ref 6.5–8.1)

## 2023-01-18 MED ORDER — LORAZEPAM 1 MG PO TABS
1.0000 mg | ORAL_TABLET | Freq: Four times a day (QID) | ORAL | Status: DC | PRN
  Filled 2023-01-18: qty 1

## 2023-01-18 MED ORDER — ONDANSETRON HCL 4 MG/2ML IJ SOLN
4.0000 mg | Freq: Once | INTRAMUSCULAR | Status: AC
  Administered 2023-01-18: 4 mg via INTRAVENOUS
  Filled 2023-01-18: qty 2

## 2023-01-18 NOTE — ED Provider Notes (Signed)
[place] EMERGENCY DEPARTMENT AT [facility] Provider Note   CSN: [id number] Arrival date & time: 01/18/23  2150     History  Chief Complaint  Patient presents with   Headache    CAMELLE STAUDACHER is a 32 y.o. female.   Headache  This patient is a 32 year old female who has a history of multiple medical problems including history of lupus from a very young age, multiple joint arthropathies which is chronic and causes chronic pain, she was diagnosed with a vertebral artery dissection in August of this year and had followed up with neurology afterwards and was recommended to be on dual antiplatelet agents.  She no longer takes the aspirin as it had upset her stomach but continues to take clopidogrel.  She reports that starting about 5 days ago she had increasing amounts of right sided neck pain which was in a similar location to when she had her neck pain during her prior event in August.  She then developed quite a severe headache that came on over the course of the last few hours which is on her bifrontal and behind both of her eyes.  She denies temporal pain occipital pain or other neurologic symptoms except for visual changes which she describes as blurry vision in both eyes right more than left.  She states that she has had no fevers, no chills, she is sensitive to light, she has had no diarrhea, no swelling of the legs, she has a slight issue with her right foot after having these issues with her vertebral artery and is currently working with a physical therapist on that.   Of note the patient is currently taking 10 mg of prednisone a day to help with her diffuse arthralgias, she had been taken off anti-inflammatories recently.  Home Medications Prior to Admission medications   Medication Sig Start Date End Date Taking? Authorizing Provider  albuterol (PROVENTIL HFA;VENTOLIN HFA) 108 (90 Base) MCG/ACT inhaler Inhale into the lungs every 6 (six) hours as needed for wheezing or  shortness of breath.    [provider]  aspirin EC 325 MG tablet Take 1 tablet (325 mg total) by mouth daily. 11/07/22   Penumalli, Glenford Bayley, MD  baclofen (LIORESAL) 20 MG tablet Take 20 mg by mouth as needed. 10/14/20   [provider]  clopidogrel (PLAVIX) 75 MG tablet Take 75 mg by mouth daily.    [provider]  cyanocobalamin (VITAMIN B12) 1000 MCG/ML injection INJECT 1 ML INTRAMUSCULARLY EVERY OTHER WEEK 10/17/22   Del Newman Nip, Tenna Child, FNP  cyclobenzaprine (FLEXERIL) 10 MG tablet Take 10 mg by mouth as needed. 10/14/20   [provider]  dexamethasone (DECADRON) 1 MG tablet Take 1 tablet (1 mg total) by mouth daily with breakfast. 11/29/22   Del Nigel Berthold, FNP  EPINEPHrine 0.3 mg/0.3 mL IJ SOAJ injection Inject 0.3 mg into the muscle once. As needed for allergic reaction    [provider]  ergocalciferol (VITAMIN D2) 1.25 MG (50000 UT) capsule Take 50,000 Units by mouth once a week.    [provider]  fludrocortisone (FLORINEF) 0.1 MG tablet Take 1 tablet (100 mcg total) by mouth daily. 11/06/22   Del Nigel Berthold, FNP  gabapentin (NEURONTIN) 400 MG capsule Take 400 mg by mouth 3 (three) times daily.    [provider]  montelukast (SINGULAIR) 10 MG tablet Take 10 mg by mouth daily. 04/11/22   [provider]  nitroGLYCERIN (NITROSTAT) 0.4 MG SL  tablet Place 1 tablet (0.4 mg total) under the tongue every 5 (five) minutes as needed for chest pain. 06/17/14   Jake Bathe, MD  NONFORMULARY OR COMPOUNDED ITEM Apply 1 application  topically 4 (four) times daily. Katamine/Gabapentin/Baclofen/Lidocaine/Menthol Cream    [provider]  ondansetron (ZOFRAN-ODT) 8 MG disintegrating tablet Take 8 mg by mouth as needed for nausea or vomiting. 02/12/21   [provider]  SSD 1 % cream Apply topically as needed. 03/23/21   Christell Constant, MD      Allergies    Adhesive [tape], Blue dyes  (parenteral), Contrast media [iodinated contrast media], Green dyes, Nickel, Omnipaque [iohexol], Onion, Other, Petroleum distillate, Silicone, Bee venom, and Yellow jacket venom    Review of Systems   Review of Systems  Neurological:  Positive for headaches.  All other systems reviewed and are negative.   Physical Exam Updated Vital Signs BP (!) 154/95   Pulse 92   Temp 98.7 F (37.1 C) (Oral)   Resp (!) 24   Ht 1.524 m (5')   Wt 52.2 kg   SpO2 100%   BMI 22.46 kg/m  Physical Exam Vitals and nursing note reviewed.  Constitutional:      General: She is in acute distress.     Appearance: She is well-developed. She is not ill-appearing, toxic-appearing or diaphoretic.  HENT:     Head: Normocephalic and atraumatic.     Mouth/Throat:     Mouth: Mucous membranes are moist.     Pharynx: No oropharyngeal exudate.  Eyes:     General: No scleral icterus.       Right eye: No discharge.        Left eye: No discharge.     Conjunctiva/sclera: Conjunctivae normal.     Pupils: Pupils are equal, round, and reactive to light.  Neck:     Thyroid: No thyromegaly.     Vascular: No JVD.     Comments: Mild tenderness along the right sternocleidomastoid area but no asymmetry no redness no warmth, no carotid bruit is heard Cardiovascular:     Rate and Rhythm: Normal rate and regular rhythm.     Heart sounds: Normal heart sounds. No murmur heard.    No friction rub. No gallop.  Pulmonary:     Effort: Pulmonary effort is normal. No respiratory distress.     Breath sounds: Normal breath sounds. No wheezing or rales.  Abdominal:     General: Bowel sounds are normal. There is no distension.     Palpations: Abdomen is soft. There is no mass.     Tenderness: There is no abdominal tenderness.  Musculoskeletal:        General: No tenderness. Normal range of motion.     Cervical back: Normal range of motion and neck supple.     Right lower leg: No edema.     Left lower leg: No edema.   Lymphadenopathy:     Cervical: No cervical adenopathy.  Skin:    General: Skin is warm and dry.     Findings: No erythema or rash.  Neurological:     Mental Status: She is alert.     Coordination: Coordination normal.     Comments: The patient is able to speak in full sentences, she has no facial droop, her extraocular movements are normal and the pupillary exam is normal, there is no ptosis or myosis, she has normal strength in all 4 extremities, she has normal sensation in all 4 extremities  for her baseline but states that she has chronic numbness and tingling of her arms and legs at certain locations related to her chronic lupus.  She has expressed blurry vision on visual acuity testing  Psychiatric:        Behavior: Behavior normal.     ED Results / Procedures / Treatments   Labs (all labs ordered are listed, but only abnormal results are displayed) Labs Reviewed  CBC WITH DIFFERENTIAL/PLATELET  COMPREHENSIVE METABOLIC PANEL    EKG None  Radiology No results found.  Procedures Procedures    Medications Ordered in ED Medications  ondansetron (ZOFRAN) injection 4 mg (has no administration in time range)  LORazepam (ATIVAN) tablet 1 mg (has no administration in time range)    ED Course/ Medical Decision Making/ A&P                                 Medical Decision Making Amount and/or Complexity of Data Reviewed Labs: ordered. Radiology: ordered.  Risk Prescription drug management.   Unfortunately this patient appears to be at significant risk for recurrent vertebral artery dissection or further neurologic sequela, with the visual changes and a prior history of vertebral artery I am at significant concern for recurrence  I discussed the case with the on-call neurohospitalist Dr. Elise Benne, who has given his expert recommendations that this patient should have an expedited MRI and MRA of the head and neck.  The studies have been ordered and the care was  discussed with the transporting service [facility] to move this patient to [facility] for further evaluation.  I discussed the case with Dr. Gwyneth Sprout in the emergency department at [facility] who is excepted care of the patient, there were no medication recommendations made by neurology at this time.  The patient was made aware and is willing to be transferred to the other hospital        Final Clinical Impression(s) / ED Diagnoses Final diagnoses:  Vertebral artery dissection [facility])    Rx / DC Orders ED Discharge Orders          Ordered    MR ANGIO HEAD WO CONTRAST        01/18/23 2245    MR ANGIO NECK W WO CONTRAST        01/18/23 2245              Eber Hong, MD 01/18/23 2250

## 2023-01-18 NOTE — ED Notes (Signed)
Report given to [facility] with [facility]

## 2023-01-18 NOTE — ED Notes (Signed)
Dr Derry Lory paged to Dr Hyacinth Meeker @ [phone]

## 2023-01-18 NOTE — ED Notes (Signed)
Pt does not want to be put in grown for MRI

## 2023-01-18 NOTE — ED Notes (Signed)
ED Provider at bedside. 

## 2023-01-18 NOTE — ED Notes (Signed)
Pt had a hard time standing in triage Pt stated that she now has an increased in LEFT sided weakness since the episode in AUG

## 2023-01-18 NOTE — ED Triage Notes (Signed)
Pt stroke alert in AUGUST Pt stated she has a torn artery in neck   Neck pain started Saturday  Headache started at 17:00 Pt complains of fuzzy vision and nausea   Informed MD of pt's arrival

## 2023-01-19 ENCOUNTER — Emergency Department (HOSPITAL_COMMUNITY): Payer: Medicare Other

## 2023-01-19 DIAGNOSIS — Q048 Other specified congenital malformations of brain: Secondary | ICD-10-CM | POA: Diagnosis not present

## 2023-01-19 DIAGNOSIS — R519 Headache, unspecified: Secondary | ICD-10-CM | POA: Diagnosis not present

## 2023-01-19 DIAGNOSIS — R29818 Other symptoms and signs involving the nervous system: Secondary | ICD-10-CM | POA: Diagnosis not present

## 2023-01-19 DIAGNOSIS — I7774 Dissection of vertebral artery: Secondary | ICD-10-CM | POA: Diagnosis not present

## 2023-01-19 MED ORDER — ONDANSETRON HCL 4 MG/2ML IJ SOLN
4.0000 mg | Freq: Once | INTRAMUSCULAR | Status: AC
  Administered 2023-01-19: 4 mg via INTRAVENOUS
  Filled 2023-01-19: qty 2

## 2023-01-19 MED ORDER — GADOBUTROL 1 MMOL/ML IV SOLN
5.5000 mL | Freq: Once | INTRAVENOUS | Status: AC | PRN
  Administered 2023-01-19: 5.5 mL via INTRAVENOUS

## 2023-01-19 MED ORDER — DIPHENHYDRAMINE HCL 50 MG/ML IJ SOLN
25.0000 mg | Freq: Once | INTRAMUSCULAR | Status: DC
  Filled 2023-01-19: qty 1

## 2023-01-19 MED ORDER — FENTANYL CITRATE PF 50 MCG/ML IJ SOSY
50.0000 ug | PREFILLED_SYRINGE | Freq: Once | INTRAMUSCULAR | Status: AC
  Administered 2023-01-19: 50 ug via INTRAVENOUS
  Filled 2023-01-19: qty 1

## 2023-01-19 MED ORDER — HYDROMORPHONE HCL 1 MG/ML IJ SOLN
1.0000 mg | Freq: Once | INTRAMUSCULAR | Status: AC
  Administered 2023-01-19: 1 mg via INTRAVENOUS
  Filled 2023-01-19: qty 1

## 2023-01-19 MED ORDER — KETOROLAC TROMETHAMINE 15 MG/ML IJ SOLN
15.0000 mg | Freq: Once | INTRAMUSCULAR | Status: AC
Start: 2023-01-19 — End: 2023-01-19
  Administered 2023-01-19: 15 mg via INTRAVENOUS
  Filled 2023-01-19: qty 1

## 2023-01-19 MED ORDER — METOCLOPRAMIDE HCL 5 MG/ML IJ SOLN
10.0000 mg | Freq: Once | INTRAMUSCULAR | Status: AC
  Administered 2023-01-19: 10 mg via INTRAVENOUS
  Filled 2023-01-19: qty 2

## 2023-01-19 NOTE — Discharge Instructions (Signed)
Your testing shows that your vertebral artery dissection is healing appropriately.  Continue your Plavix and follow-up with your neurologist.  Return to the ED with worsening severe headache, unilateral weakness, numbness, tingling, difficulty speaking, difficulty swallowing or any other concerns.

## 2023-01-19 NOTE — ED Notes (Signed)
Pt comes from [facility] for MRI via [facility].

## 2023-01-19 NOTE — ED Provider Notes (Signed)
This is a 32 year old female recently transferred from [facility] ER department for further evaluation of her chief complaints of headache.  Patient has multiple medical problems which include lupus, multiple joints arthropathies who developed progressive worsening pain to the right side of her neck and now having headache.  Please note patient was diagnosed with vertebral artery dissection in August of this year.  Previous provider voiced concerns for recurrent vertebral artery dissection.  On-call neurohospitalist was consulted who recommend patient to be transferred over to [facility], ER for MRI and MRA of the head and neck.   Fayrene Helper, PA-C 01/19/23 Terisa Starr, MD 01/19/23 [phone]

## 2023-01-19 NOTE — ED Provider Notes (Signed)
Patient transferred from [facility] as below.  History of vertebral artery dissection in August now with 5 days of right sided neck pain and severe headache.  Sent for MRA as she is allergic to CT contrast.  Apparently orders did not appropriately crossover and only MRI brain was done.  Will add on MRA of head and neck.  MRI is negative for stroke.  MRA is positive for healing right vertebral artery dissection.  No worsening.  Discussed with Dr. Derry Lory of neurology.  He reviewed patient's imaging.  He agrees continue her aspirin and follow-up outpatient with her neurologist as scheduled.  Continue to treat her headache.  Patient states her PCP has referred to to a new neurologist as an outpatient who she does not know the name of.  She will continue to take her Plavix.  Her aspirin was stopped due to stomach upset.  Imaging shows improvement in her vertebral artery dissection and her headache has improved with pain medication in the ED.  Discussion with Dr. Derry Lory.  Patient stable for follow-up with her outpatient neurologist.  Continue her plavix.  Return precautions discussed.   Glynn Octave, MD 01/19/23 [phone]

## 2023-01-21 DIAGNOSIS — F319 Bipolar disorder, unspecified: Secondary | ICD-10-CM | POA: Diagnosis not present

## 2023-01-21 DIAGNOSIS — D519 Vitamin B12 deficiency anemia, unspecified: Secondary | ICD-10-CM | POA: Diagnosis not present

## 2023-01-21 DIAGNOSIS — F4310 Post-traumatic stress disorder, unspecified: Secondary | ICD-10-CM | POA: Diagnosis not present

## 2023-01-21 DIAGNOSIS — R531 Weakness: Secondary | ICD-10-CM | POA: Diagnosis not present

## 2023-01-21 DIAGNOSIS — F419 Anxiety disorder, unspecified: Secondary | ICD-10-CM | POA: Diagnosis not present

## 2023-01-21 DIAGNOSIS — I7774 Dissection of vertebral artery: Secondary | ICD-10-CM | POA: Diagnosis not present

## 2023-01-22 DIAGNOSIS — R531 Weakness: Secondary | ICD-10-CM | POA: Diagnosis not present

## 2023-01-22 DIAGNOSIS — D519 Vitamin B12 deficiency anemia, unspecified: Secondary | ICD-10-CM | POA: Diagnosis not present

## 2023-01-22 DIAGNOSIS — F319 Bipolar disorder, unspecified: Secondary | ICD-10-CM | POA: Diagnosis not present

## 2023-01-22 DIAGNOSIS — I7774 Dissection of vertebral artery: Secondary | ICD-10-CM | POA: Diagnosis not present

## 2023-01-22 DIAGNOSIS — F4310 Post-traumatic stress disorder, unspecified: Secondary | ICD-10-CM | POA: Diagnosis not present

## 2023-01-22 DIAGNOSIS — F419 Anxiety disorder, unspecified: Secondary | ICD-10-CM | POA: Diagnosis not present

## 2023-01-24 NOTE — Progress Notes (Signed)
My note explains indications

## 2023-01-28 DIAGNOSIS — F319 Bipolar disorder, unspecified: Secondary | ICD-10-CM | POA: Diagnosis not present

## 2023-01-28 DIAGNOSIS — I7774 Dissection of vertebral artery: Secondary | ICD-10-CM | POA: Diagnosis not present

## 2023-01-28 DIAGNOSIS — F419 Anxiety disorder, unspecified: Secondary | ICD-10-CM | POA: Diagnosis not present

## 2023-01-28 DIAGNOSIS — D519 Vitamin B12 deficiency anemia, unspecified: Secondary | ICD-10-CM | POA: Diagnosis not present

## 2023-01-28 DIAGNOSIS — F4310 Post-traumatic stress disorder, unspecified: Secondary | ICD-10-CM | POA: Diagnosis not present

## 2023-01-28 DIAGNOSIS — R531 Weakness: Secondary | ICD-10-CM | POA: Diagnosis not present

## 2023-01-29 DIAGNOSIS — F419 Anxiety disorder, unspecified: Secondary | ICD-10-CM | POA: Diagnosis not present

## 2023-01-29 DIAGNOSIS — F4310 Post-traumatic stress disorder, unspecified: Secondary | ICD-10-CM | POA: Diagnosis not present

## 2023-01-29 DIAGNOSIS — I7774 Dissection of vertebral artery: Secondary | ICD-10-CM | POA: Diagnosis not present

## 2023-01-29 DIAGNOSIS — D519 Vitamin B12 deficiency anemia, unspecified: Secondary | ICD-10-CM | POA: Diagnosis not present

## 2023-01-29 DIAGNOSIS — R531 Weakness: Secondary | ICD-10-CM | POA: Diagnosis not present

## 2023-01-29 DIAGNOSIS — F319 Bipolar disorder, unspecified: Secondary | ICD-10-CM | POA: Diagnosis not present

## 2023-02-03 ENCOUNTER — Telehealth: Payer: Medicare Other | Admitting: Diagnostic Neuroimaging

## 2023-02-05 DIAGNOSIS — I7774 Dissection of vertebral artery: Secondary | ICD-10-CM | POA: Diagnosis not present

## 2023-02-05 DIAGNOSIS — Z556 Problems related to health literacy: Secondary | ICD-10-CM | POA: Diagnosis not present

## 2023-02-05 DIAGNOSIS — Z7951 Long term (current) use of inhaled steroids: Secondary | ICD-10-CM | POA: Diagnosis not present

## 2023-02-05 DIAGNOSIS — F4000 Agoraphobia, unspecified: Secondary | ICD-10-CM | POA: Diagnosis not present

## 2023-02-05 DIAGNOSIS — G249 Dystonia, unspecified: Secondary | ICD-10-CM | POA: Diagnosis not present

## 2023-02-05 DIAGNOSIS — G8929 Other chronic pain: Secondary | ICD-10-CM | POA: Diagnosis not present

## 2023-02-05 DIAGNOSIS — M419 Scoliosis, unspecified: Secondary | ICD-10-CM | POA: Diagnosis not present

## 2023-02-05 DIAGNOSIS — R079 Chest pain, unspecified: Secondary | ICD-10-CM | POA: Diagnosis not present

## 2023-02-05 DIAGNOSIS — N920 Excessive and frequent menstruation with regular cycle: Secondary | ICD-10-CM | POA: Diagnosis not present

## 2023-02-05 DIAGNOSIS — F319 Bipolar disorder, unspecified: Secondary | ICD-10-CM | POA: Diagnosis not present

## 2023-02-05 DIAGNOSIS — D519 Vitamin B12 deficiency anemia, unspecified: Secondary | ICD-10-CM | POA: Diagnosis not present

## 2023-02-05 DIAGNOSIS — I10 Essential (primary) hypertension: Secondary | ICD-10-CM | POA: Diagnosis not present

## 2023-02-05 DIAGNOSIS — Z791 Long term (current) use of non-steroidal anti-inflammatories (NSAID): Secondary | ICD-10-CM | POA: Diagnosis not present

## 2023-02-05 DIAGNOSIS — F4310 Post-traumatic stress disorder, unspecified: Secondary | ICD-10-CM | POA: Diagnosis not present

## 2023-02-05 DIAGNOSIS — Z7902 Long term (current) use of antithrombotics/antiplatelets: Secondary | ICD-10-CM | POA: Diagnosis not present

## 2023-02-05 DIAGNOSIS — R109 Unspecified abdominal pain: Secondary | ICD-10-CM | POA: Diagnosis not present

## 2023-02-05 DIAGNOSIS — F419 Anxiety disorder, unspecified: Secondary | ICD-10-CM | POA: Diagnosis not present

## 2023-02-05 DIAGNOSIS — R531 Weakness: Secondary | ICD-10-CM | POA: Diagnosis not present

## 2023-02-05 DIAGNOSIS — Z9981 Dependence on supplemental oxygen: Secondary | ICD-10-CM | POA: Diagnosis not present

## 2023-02-10 ENCOUNTER — Telehealth: Payer: Self-pay | Admitting: Internal Medicine

## 2023-02-10 NOTE — Telephone Encounter (Signed)
  Pt's Mom said, the referral to Dr. Graciela Husbands should be not POTs or any related to POTs. Per Mindi Junker, Dr. Alyson Locket nurse, autonomic dysfunction, dysautonomia is under the same branch of POTs and the treatment is the same. Pt's mom is very upset, because we need to reschedule her daughter. However, Dr. Graciela Husbands can only see patients with this diagnosis autonomic dysfunction, dysautonomia, POTs on Tuesday afternoon in [address]. Pt's mom wants to get clarification from Dr. Jenene Slicker to clarified diagnosis. She wants a callback today

## 2023-02-11 ENCOUNTER — Encounter: Payer: Self-pay | Admitting: *Deleted

## 2023-02-11 DIAGNOSIS — D519 Vitamin B12 deficiency anemia, unspecified: Secondary | ICD-10-CM | POA: Diagnosis not present

## 2023-02-11 DIAGNOSIS — R531 Weakness: Secondary | ICD-10-CM | POA: Diagnosis not present

## 2023-02-11 DIAGNOSIS — F419 Anxiety disorder, unspecified: Secondary | ICD-10-CM | POA: Diagnosis not present

## 2023-02-11 DIAGNOSIS — F4310 Post-traumatic stress disorder, unspecified: Secondary | ICD-10-CM | POA: Diagnosis not present

## 2023-02-11 DIAGNOSIS — I7774 Dissection of vertebral artery: Secondary | ICD-10-CM | POA: Diagnosis not present

## 2023-02-11 DIAGNOSIS — F319 Bipolar disorder, unspecified: Secondary | ICD-10-CM | POA: Diagnosis not present

## 2023-02-11 NOTE — Telephone Encounter (Signed)
Left message to return call OR may reply via mychart.  Will send message via mychart as well.

## 2023-02-12 ENCOUNTER — Ambulatory Visit: Payer: Medicare Other | Admitting: Internal Medicine

## 2023-02-12 DIAGNOSIS — I7774 Dissection of vertebral artery: Secondary | ICD-10-CM | POA: Diagnosis not present

## 2023-02-12 DIAGNOSIS — F319 Bipolar disorder, unspecified: Secondary | ICD-10-CM | POA: Diagnosis not present

## 2023-02-12 DIAGNOSIS — F419 Anxiety disorder, unspecified: Secondary | ICD-10-CM | POA: Diagnosis not present

## 2023-02-12 DIAGNOSIS — F4310 Post-traumatic stress disorder, unspecified: Secondary | ICD-10-CM | POA: Diagnosis not present

## 2023-02-12 DIAGNOSIS — R531 Weakness: Secondary | ICD-10-CM | POA: Diagnosis not present

## 2023-02-12 DIAGNOSIS — D519 Vitamin B12 deficiency anemia, unspecified: Secondary | ICD-10-CM | POA: Diagnosis not present

## 2023-02-13 DIAGNOSIS — J4531 Mild persistent asthma with (acute) exacerbation: Secondary | ICD-10-CM | POA: Diagnosis not present

## 2023-02-13 DIAGNOSIS — M160 Bilateral primary osteoarthritis of hip: Secondary | ICD-10-CM | POA: Diagnosis not present

## 2023-02-13 DIAGNOSIS — L309 Dermatitis, unspecified: Secondary | ICD-10-CM | POA: Diagnosis not present

## 2023-02-13 DIAGNOSIS — Z8673 Personal history of transient ischemic attack (TIA), and cerebral infarction without residual deficits: Secondary | ICD-10-CM | POA: Diagnosis not present

## 2023-02-13 DIAGNOSIS — I951 Orthostatic hypotension: Secondary | ICD-10-CM | POA: Diagnosis not present

## 2023-02-14 DIAGNOSIS — F4310 Post-traumatic stress disorder, unspecified: Secondary | ICD-10-CM | POA: Diagnosis not present

## 2023-02-14 DIAGNOSIS — R531 Weakness: Secondary | ICD-10-CM | POA: Diagnosis not present

## 2023-02-14 DIAGNOSIS — D519 Vitamin B12 deficiency anemia, unspecified: Secondary | ICD-10-CM | POA: Diagnosis not present

## 2023-02-14 DIAGNOSIS — F319 Bipolar disorder, unspecified: Secondary | ICD-10-CM | POA: Diagnosis not present

## 2023-02-14 DIAGNOSIS — F419 Anxiety disorder, unspecified: Secondary | ICD-10-CM | POA: Diagnosis not present

## 2023-02-14 DIAGNOSIS — I7774 Dissection of vertebral artery: Secondary | ICD-10-CM | POA: Diagnosis not present

## 2023-02-17 DIAGNOSIS — F4310 Post-traumatic stress disorder, unspecified: Secondary | ICD-10-CM | POA: Diagnosis not present

## 2023-02-17 DIAGNOSIS — F319 Bipolar disorder, unspecified: Secondary | ICD-10-CM | POA: Diagnosis not present

## 2023-02-17 DIAGNOSIS — F419 Anxiety disorder, unspecified: Secondary | ICD-10-CM | POA: Diagnosis not present

## 2023-02-17 DIAGNOSIS — I7774 Dissection of vertebral artery: Secondary | ICD-10-CM | POA: Diagnosis not present

## 2023-02-17 DIAGNOSIS — R531 Weakness: Secondary | ICD-10-CM | POA: Diagnosis not present

## 2023-02-17 DIAGNOSIS — D519 Vitamin B12 deficiency anemia, unspecified: Secondary | ICD-10-CM | POA: Diagnosis not present

## 2023-02-18 ENCOUNTER — Other Ambulatory Visit: Payer: Self-pay

## 2023-02-18 ENCOUNTER — Ambulatory Visit
Admission: EM | Admit: 2023-02-18 | Discharge: 2023-02-18 | Disposition: A | Discharge status: Home / Self Care | Payer: Medicare Other

## 2023-02-18 ENCOUNTER — Telehealth: Payer: Self-pay

## 2023-02-18 DIAGNOSIS — L03116 Cellulitis of left lower limb: Secondary | ICD-10-CM | POA: Diagnosis not present

## 2023-02-18 DIAGNOSIS — R030 Elevated blood-pressure reading, without diagnosis of hypertension: Secondary | ICD-10-CM

## 2023-02-18 MED ORDER — CEPHALEXIN 500 MG PO CAPS: 500.0000 mg | ORAL_CAPSULE | Freq: Four times a day (QID) | ORAL | 0 refills | Status: AC

## 2023-02-18 NOTE — ED Provider Notes (Signed)
[facility]    CSN: [id number] Arrival date & time: 02/18/23  0846      History   Chief Complaint No chief complaint on file.   HPI Traci Hensley is a 32 y.o. female.   Patient presents today with wound to left lower extremity that has been ongoing for the past 3 weeks.  She reports she has a home health nurse who has been doing the dressing changes and she is concerned that the wound is not healing and may be infected.  She denies fevers or nausea/vomiting.  Patient reports she does not recall how she got the wound if she has "not been right" since her "stroke" in August.   Patient also concerned about elevated blood pressure.  Reports that she has history of vertebral artery dissection and autonomic dysfunction, has fluctuations in heart rate and blood pressure consistently and has noticed her blood pressure is only elevated at home for the past few days.  She denies new or worsening shortness of breath, chest pain, blurred or double vision, lightheadedness, or dizziness reports she has all of these things at baseline and is not worse than normal.  Does not currently take anything for blood pressure.    Past Medical History:  Diagnosis Date   Agoraphobia    Anxiety    B12 deficiency anemia    Bipolar 1 disorder (HCC)    Chronic abdominal pain    Chronic chest pain    Depression    Dystonia    Unknown which type   PTSD (post-traumatic stress disorder)    Scoliosis    Vertebral artery dissection (HCC) 11/03/2022    Patient Active Problem List   Diagnosis Date Noted   Right leg weakness 11/06/2022   Spotting 11/06/2022   Vertebral artery dissection (HCC) 11/03/2022   Anxiety and depression 10/29/2022   Vitamin B12 deficiency 10/15/2022   Bradycardia 08/27/2022   Autonomic dysfunction 08/27/2022   History of enlargement of pituitary gland 12/25/2017   Moderate malnutrition (HCC) 12/25/2017   Loss of weight 12/25/2017   Vitamin D deficiency 12/25/2017    Cervical spondylosis 11/27/2017   Fatigue 11/27/2017   Family history of lupus erythematosus 11/27/2017   Thoracic spondylosis without myelopathy 11/27/2017   Ingrown toenail 11/23/2015   Dyspepsia 08/31/2014   Abdominal pain 06/29/2014   Achilles tendinitis of both lower extremities 01/23/2014   Palpitations 04/19/2013   Chest pain 04/19/2013   Sinus tachycardia 04/19/2013   Anxiety 04/19/2013    Past Surgical History:  Procedure Laterality Date   MOUTH SURGERY      OB History   No obstetric history on file.      Home Medications    Prior to Admission medications   Medication Sig Start Date End Date Taking? Authorizing Provider  cephALEXin (KEFLEX) 500 MG capsule Take 1 capsule (500 mg total) by mouth 4 (four) times daily for 5 days. 02/18/23 02/23/23 Yes Valentino Nose, NP  predniSONE (DELTASONE) 5 MG tablet Take 5 mg by mouth daily. 02/04/23  Yes [provider]  albuterol (PROVENTIL HFA;VENTOLIN HFA) 108 (90 Base) MCG/ACT inhaler Inhale into the lungs every 6 (six) hours as needed for wheezing or shortness of breath.    [provider]  aspirin EC 325 MG tablet Take 1 tablet (325 mg total) by mouth daily. 11/07/22   Penumalli, Glenford Bayley, MD  baclofen (LIORESAL) 20 MG tablet Take 20 mg by mouth as needed. 10/14/20   [provider]  clopidogrel (PLAVIX)  75 MG tablet Take 75 mg by mouth daily.    [provider]  cyanocobalamin (VITAMIN B12) 1000 MCG/ML injection INJECT 1 ML INTRAMUSCULARLY EVERY OTHER WEEK 10/17/22   Del Newman Nip, Tenna Child, FNP  cyclobenzaprine (FLEXERIL) 10 MG tablet Take 10 mg by mouth as needed. 10/14/20   [provider]  dexamethasone (DECADRON) 1 MG tablet Take 1 tablet (1 mg total) by mouth daily with breakfast. 11/29/22   Del Nigel Berthold, FNP  EPINEPHrine 0.3 mg/0.3 mL IJ SOAJ injection Inject 0.3 mg into the muscle once. As needed for allergic reaction    [provider]  ergocalciferol  (VITAMIN D2) 1.25 MG (50000 UT) capsule Take 50,000 Units by mouth once a week.    [provider]  fludrocortisone (FLORINEF) 0.1 MG tablet Take 1 tablet (100 mcg total) by mouth daily. 11/06/22   Del Nigel Berthold, FNP  gabapentin (NEURONTIN) 400 MG capsule Take 400 mg by mouth 3 (three) times daily.    [provider]  montelukast (SINGULAIR) 10 MG tablet Take 10 mg by mouth daily. 04/11/22   [provider]  nitroGLYCERIN (NITROSTAT) 0.4 MG SL tablet Place 1 tablet (0.4 mg total) under the tongue every 5 (five) minutes as needed for chest pain. 06/17/14   Jake Bathe, MD  NONFORMULARY OR COMPOUNDED ITEM Apply 1 application  topically 4 (four) times daily. Katamine/Gabapentin/Baclofen/Lidocaine/Menthol Cream    [provider]  ondansetron (ZOFRAN) 8 MG tablet Take 8 mg by mouth.    [provider]  ondansetron (ZOFRAN-ODT) 8 MG disintegrating tablet Take 8 mg by mouth as needed for nausea or vomiting. 02/12/21   [provider]  SSD 1 % cream Apply topically as needed. 03/23/21   Christell Constant, MD    Family History Family History  Problem Relation Age of Onset   Arrhythmia Mother    Colon cancer Neg Hx     Social History Social History   Tobacco Use   Smoking status: Some Days    Current packs/day: 0.00    Types: Cigarettes    Start date: 02/15/2012    Last attempt to quit: 02/14/2014    Years since quitting: 9.0   Smokeless tobacco: Never   Tobacco comments:    rare, social smoker  Vaping Use   Vaping status: Former  Substance Use Topics   Alcohol use: Not Currently   Drug use: No     Allergies   Adhesive [tape], Blue dyes (parenteral), Contrast media [iodinated contrast media], Green dyes, Nickel, Omnipaque [iohexol], Onion, Other, Petroleum distillate, Silicone, Bee venom, and Yellow jacket venom   Review of Systems Review of Systems Per HPI  Physical Exam Triage Vital Signs ED Triage Vitals   Encounter Vitals Group     BP 02/18/23 0903 (!) 161/93     Systolic BP Percentile --      Diastolic BP Percentile --      Pulse Rate 02/18/23 0903 (!) 132     Resp 02/18/23 0903 16     Temp 02/18/23 0903 98.1 F (36.7 C)     Temp Source 02/18/23 0903 Oral     SpO2 02/18/23 0903 98 %     Weight --      Height --      Head Circumference --      Peak Flow --      Pain Score 02/18/23 0905 7     Pain Loc --      Pain Education --  Exclude from Growth Chart --    No data found.  Updated Vital Signs BP (!) 144/88 (BP Location: Right Arm)   Pulse 100   Temp 98.1 F (36.7 C) (Oral)   Resp 20   SpO2 96%   Visual Acuity Right Eye Distance:   Left Eye Distance:   Bilateral Distance:    Right Eye Near:   Left Eye Near:    Bilateral Near:     Physical Exam Vitals and nursing note reviewed.  Constitutional:      General: She is not in acute distress.    Appearance: Normal appearance. She is not toxic-appearing.  HENT:     Mouth/Throat:     Mouth: Mucous membranes are moist.     Pharynx: Oropharynx is clear.  Eyes:     General:        Right eye: No discharge.        Left eye: No discharge.     Extraocular Movements: Extraocular movements intact.  Cardiovascular:     Rate and Rhythm: Normal rate and regular rhythm.  Pulmonary:     Effort: Pulmonary effort is normal. No respiratory distress.     Breath sounds: Normal breath sounds. No wheezing, rhonchi or rales.  Skin:    General: Skin is warm and dry.     Capillary Refill: Capillary refill takes less than 2 seconds.     Comments: Multiple abrasions noted to LLE; there is a full thickness wound to the lateral aspect that is open with slight surrounding erythema.  Tenderness to wound is out of proportion to examination findings.  Neurological:     Mental Status: She is alert and oriented to person, place, and time.  Psychiatric:        Behavior: Behavior is cooperative.      UC Treatments / Results  Labs (all  labs ordered are listed, but only abnormal results are displayed) Labs Reviewed - No data to display  EKG   Radiology No results found.  Procedures Procedures (including critical care time)  Medications Ordered in UC Medications - No data to display  Initial Impression / Assessment and Plan / UC Course  I have reviewed the triage vital signs and the nursing notes.  Pertinent labs & imaging results that were available during my care of the patient were reviewed by me and considered in my medical decision making (see chart for details).   Patient is well-appearing, normotensive, afebrile, not tachycardic, not tachypneic, oxygenating well on room air.    1. Cellulitis of left lower extremity Wound care discussed Start oral Keflex to treat for cellulitis Recommended follow up with Wound Clinic to help with wound healing and referral placed today  2. Elevated blood pressure reading in office without diagnosis of hypertension Blood pressure is much improved on recheck  EKG shows normal sinus rhythm without ST segment or T wave changes It does not sound like any of her chronic symptoms related to blood pressure are worse  I recommended ER evaluation if her symptoms worsen or if she checks blood pressure at home and remains elevated >160/100.   The patient was given the opportunity to ask questions.  All questions answered to their satisfaction.  The patient is in agreement to this plan.    Final Clinical Impressions(s) / UC Diagnoses   Final diagnoses:  Cellulitis of left lower extremity  Elevated blood pressure reading in office without diagnosis of hypertension     Discharge Instructions  Regarding the wound on your leg: Clean area twice daily with soap and water.  Continue to apply the Bactroban ointment twice daily and keep covered.  In addition, start the oral Keflex that I have sent to the pharmacy and take as prescribed to treat for bacterial skin infection.   Recommend close follow-up with wound clinic; I have placed a referral today and you can call the number provided to make an appointment.  EKG today shows normal sinus rhythm.  Regarding the elevated blood pressure; continue checking the blood pressure at home.  If it remains elevated over the next 12 to 24 hours or if any of your symptoms worsen including dizziness, lightheadedness, chest pain, shortness of breath, headache, or vision changes, please call 911 and seek care emergently.    ED Prescriptions     Medication Sig Dispense Auth. Provider   cephALEXin (KEFLEX) 500 MG capsule Take 1 capsule (500 mg total) by mouth 4 (four) times daily for 5 days. 20 capsule Valentino Nose, NP      PDMP not reviewed this encounter.   Valentino Nose, NP 02/18/23 [phone]

## 2023-02-18 NOTE — Discharge Instructions (Addendum)
Regarding the wound on your leg: Clean area twice daily with soap and water.  Continue to apply the Bactroban ointment twice daily and keep covered.  In addition, start the oral Keflex that I have sent to the pharmacy and take as prescribed to treat for bacterial skin infection.  Recommend close follow-up with wound clinic; I have placed a referral today and you can call the number provided to make an appointment.  EKG today shows normal sinus rhythm.  Regarding the elevated blood pressure; continue checking the blood pressure at home.  If it remains elevated over the next 12 to 24 hours or if any of your symptoms worsen including dizziness, lightheadedness, chest pain, shortness of breath, headache, or vision changes, please call 911 and seek care emergently.

## 2023-02-18 NOTE — ED Notes (Signed)
Site cleaned with wound cleanser. Pt tolerated well.   Sites dressed with bacitracin, nonadherent pad, reinforced sites that were draining with gauze pad on top of nonadherent, secured with coban. Pt tolerated well.   Site management and infection prevention education reviewed. Pt verbalized understanding.

## 2023-02-18 NOTE — Telephone Encounter (Signed)
Copied from CRM [id number]. Topic: Clinical - Home Health Verbal Orders >> Feb 14, 2023  9:58 AM Gaetano Hawthorne wrote: Caller/Agency: Victorino Dike with [facility] Callback Number: [phone] Service Requested: Home Health company faxed over orders for the following:  PT, Home Health, Skilled nursing and various medications - they need the orders to be dated and faxed back. You may call back with any questions or if you have not received the fax from them. They were sent over back in October.  Any new concerns about the patient? No

## 2023-02-18 NOTE — ED Triage Notes (Signed)
Pt reports, having elevated BP and infection on the left ankle. Pt states she had a stroke back in August and still has a tear in her artery and is concerned about BP.

## 2023-02-20 DIAGNOSIS — F319 Bipolar disorder, unspecified: Secondary | ICD-10-CM | POA: Diagnosis not present

## 2023-02-20 DIAGNOSIS — F4310 Post-traumatic stress disorder, unspecified: Secondary | ICD-10-CM | POA: Diagnosis not present

## 2023-02-20 DIAGNOSIS — I7774 Dissection of vertebral artery: Secondary | ICD-10-CM | POA: Diagnosis not present

## 2023-02-20 DIAGNOSIS — R531 Weakness: Secondary | ICD-10-CM | POA: Diagnosis not present

## 2023-02-20 DIAGNOSIS — D519 Vitamin B12 deficiency anemia, unspecified: Secondary | ICD-10-CM | POA: Diagnosis not present

## 2023-02-20 DIAGNOSIS — F419 Anxiety disorder, unspecified: Secondary | ICD-10-CM | POA: Diagnosis not present

## 2023-02-24 DIAGNOSIS — D519 Vitamin B12 deficiency anemia, unspecified: Secondary | ICD-10-CM | POA: Diagnosis not present

## 2023-02-24 DIAGNOSIS — I7774 Dissection of vertebral artery: Secondary | ICD-10-CM | POA: Diagnosis not present

## 2023-02-24 DIAGNOSIS — F319 Bipolar disorder, unspecified: Secondary | ICD-10-CM | POA: Diagnosis not present

## 2023-02-24 DIAGNOSIS — F419 Anxiety disorder, unspecified: Secondary | ICD-10-CM | POA: Diagnosis not present

## 2023-02-24 DIAGNOSIS — R531 Weakness: Secondary | ICD-10-CM | POA: Diagnosis not present

## 2023-02-24 DIAGNOSIS — F4310 Post-traumatic stress disorder, unspecified: Secondary | ICD-10-CM | POA: Diagnosis not present

## 2023-02-25 ENCOUNTER — Ambulatory Visit (INDEPENDENT_AMBULATORY_CARE_PROVIDER_SITE_OTHER): Payer: Medicare Other

## 2023-02-25 ENCOUNTER — Ambulatory Visit: Payer: Medicare Other | Admitting: Podiatry

## 2023-02-25 DIAGNOSIS — I7774 Dissection of vertebral artery: Secondary | ICD-10-CM | POA: Diagnosis not present

## 2023-02-25 DIAGNOSIS — R21 Rash and other nonspecific skin eruption: Secondary | ICD-10-CM

## 2023-02-25 DIAGNOSIS — S90512A Abrasion, left ankle, initial encounter: Secondary | ICD-10-CM

## 2023-02-25 DIAGNOSIS — F319 Bipolar disorder, unspecified: Secondary | ICD-10-CM | POA: Diagnosis not present

## 2023-02-25 DIAGNOSIS — F4310 Post-traumatic stress disorder, unspecified: Secondary | ICD-10-CM | POA: Diagnosis not present

## 2023-02-25 DIAGNOSIS — D519 Vitamin B12 deficiency anemia, unspecified: Secondary | ICD-10-CM | POA: Diagnosis not present

## 2023-02-25 DIAGNOSIS — L089 Local infection of the skin and subcutaneous tissue, unspecified: Secondary | ICD-10-CM

## 2023-02-25 DIAGNOSIS — S91332S Puncture wound without foreign body, left foot, sequela: Secondary | ICD-10-CM | POA: Diagnosis not present

## 2023-02-25 DIAGNOSIS — R531 Weakness: Secondary | ICD-10-CM | POA: Diagnosis not present

## 2023-02-25 DIAGNOSIS — F419 Anxiety disorder, unspecified: Secondary | ICD-10-CM | POA: Diagnosis not present

## 2023-02-25 MED ORDER — ZINC OXIDE 20 % EX OINT: TOPICAL_OINTMENT | Freq: Two times a day (BID) | CUTANEOUS | 3 refills | Status: DC

## 2023-02-25 MED ORDER — DOXYCYCLINE HYCLATE 100 MG PO CAPS: 100.0000 mg | ORAL_CAPSULE | Freq: Two times a day (BID) | ORAL | 0 refills | Status: AC

## 2023-02-25 NOTE — Progress Notes (Unsigned)
Chief Complaint  Patient presents with   Wound Check    Patient is here for open wound on left foot, superficial abrasions with burning   HPI: 32 y.o. female presents today with concern of a spreading rash on the left leg x 3 weeks.  States this began with some type of scrape to the lateral aspect of the left ankle.  States that it began spreading.  She was using some antibiotic ointment on the area.  She ended up going to the emergency room on 02/18/2023 in which she was started on oral cephalexin to treat diagnosed cellulitis.  She was referred to the wound care clinic but states that she has to wait approximately 1 month for their first available appointment.  Therefore they had called our office in hopes that she could be seen sooner.  Patient has been applying Telfa dressing along with gauze wrap to the areas on the leg.  She states that new "spots" are popping up on the ankle and anterior shin.  States that these are starting to spread up toward her knee and she is getting very concerned.  The patient and her mother, who is present today as well, feel that this is an infection and that she needs continued antibiotics.  She denies any itching to the area.  States that she has never had this type of rash or reaction before.  She does have several allergies listed which include an allergy to tape and petrolatum.  As she does not use Neosporin for this reason.  As she had tried Silvadene cream on the area as well.  She notes nothing is helping and she is getting very concerned since it is beginning to spread up the leg.  She currently has a home health nurse helping her with some dressings on the leg.  Denies recent addition of new medications other than the antibiotics but the rash was already present  Patient denies any change in detergent or clothing as well as denial of recent insect bite.  Denies fever.    Past Medical History:  Diagnosis Date   Agoraphobia    Anxiety    B12 deficiency  anemia    Bipolar 1 disorder (HCC)    Chronic abdominal pain    Chronic chest pain    Depression    Dystonia    Unknown which type   PTSD (post-traumatic stress disorder)    Scoliosis    Vertebral artery dissection (HCC) 11/03/2022    Past Surgical History:  Procedure Laterality Date   MOUTH SURGERY      Allergies  Allergen Reactions   Adhesive [Tape] Other (See Comments)    Eats skin almost instantly   Blue Dyes (Parenteral) Other (See Comments)    BLUE DYES IS OK BY ITSELF, BUT CANNOT BE COMBINED WITH GREEN DYES   Contrast Media [Iodinated Contrast Media] Nausea And Vomiting    Liquid version not the IV. Causes Vomiting up blood, shaking and tremors uncontrollable, dizziness and lightheadness   Green Dyes Other (See Comments)    GREEN DYE IS OK BY ITSELF, BUT CANNOT BE COMBINED WITH BLUE DYE   Nickel Itching, Swelling, Rash and Other (See Comments)   Omnipaque [Iohexol]     CT tech notes state "Patient allergy to IV dye"   Onion     Cannot be around onion at all or she has breathing difficulty   Other Nausea Only, Rash and Other (See Comments)    Sunlight causes  extreme fatigue , Migraine , Cat Dander  Any type of anesthetic causes her to have prolonged sedation, requiring more than normal doses to reverse them   Petroleum Distillate Itching, Swelling and Rash   Silicone Rash    rash   Bee Venom Hives    Severe disorientation   Yellow Jacket Venom Hives    Severe disorientation    Physical Exam: There are palpable pedal pulses left foot.  No significant edema is noted.  There are partial-thickness irregularly-shaped wounds on the lateral aspect of the ankle, anterior aspect of the ankle, and intermittent wounds which are also superficial on the anterior aspect of the left leg extending from the ankle to the tibial tuberosity.  No cellulitis or calor can be appreciated.  No active drainage/oozing is noted however there is a small amount of drainage seen on her Telfa  bandage.  There is minimal discomfort to the area.  Epicritic sensation intact        Assessment/Plan of Care: 1. Penetrating foot wound, left, sequela   2. Rash   3. Infected abrasion of left ankle, initial encounter      Meds ordered this encounter  Medications   doxycycline (VIBRAMYCIN) 100 MG capsule    Sig: Take 1 capsule (100 mg total) by mouth 2 (two) times daily for 10 days.    Dispense:  20 capsule    Refill:  0   hydrocortisone 2.5%-nystatin-zinc oxide 20% 1:1:1 ointment mixture    Sig: Apply topically 2 (two) times daily.    Dispense:  240 g    Refill:  3   AMB REFERRAL TO INFECTIOUS DISEASE  Discussed clinical findings with patient today.  Due to our [employer] scope of practice for podiatry, we cannot legally continue to treat this issue on her leg.  Strongly recommend referral to infectious disease since this has been progressively worsening and spreading more proximally.  The patient and her mother were agreeable to this.  Hopefully the wound care center will contact the patient if they have any cancellations to evaluate her earlier.  Will start the patient on doxycycline just in case this could be from an insect bite that the patient may not have recalled.  Also will start her on a topical mixture of steroid cream/antifungal/antibiotic.  Patient stated that if the cream was not available she could try an ointment but sometimes this may cause an allergic reaction/rash, but she is willing to try it if it may help.  Attempted to prescribe Cortisporin cream but this was not located within the epic formulary.  Depending on when the patient can be seen by infectious disease she should either see the PCP or urgent care if she needs further evaluation between now and then.  The wounds were redressed with Silvadene cream, Telfa padding and gauze padding.  Will place an order to [facility] to have adequate with supplies sent to her home for daily dressing  changes to protect the area.   Clerance Lav, DPM, FACFAS [facility]     [address] [place], [place] [address]                Office [phone]  Fax [phone]

## 2023-02-27 ENCOUNTER — Telehealth: Payer: Self-pay

## 2023-02-27 DIAGNOSIS — I7774 Dissection of vertebral artery: Secondary | ICD-10-CM | POA: Diagnosis not present

## 2023-02-27 DIAGNOSIS — F4310 Post-traumatic stress disorder, unspecified: Secondary | ICD-10-CM | POA: Diagnosis not present

## 2023-02-27 DIAGNOSIS — D519 Vitamin B12 deficiency anemia, unspecified: Secondary | ICD-10-CM | POA: Diagnosis not present

## 2023-02-27 DIAGNOSIS — F419 Anxiety disorder, unspecified: Secondary | ICD-10-CM | POA: Diagnosis not present

## 2023-02-27 DIAGNOSIS — F319 Bipolar disorder, unspecified: Secondary | ICD-10-CM | POA: Diagnosis not present

## 2023-02-27 DIAGNOSIS — R531 Weakness: Secondary | ICD-10-CM | POA: Diagnosis not present

## 2023-02-27 NOTE — Telephone Encounter (Signed)
Patient's healthcare power of attorney called stating that cream that was sent in for patient is not covered by insurance. She is requesting bactroban or another cream be sent to the pharmacy. She also stated that patient had negative reaction to antibiotic and requesting a different one be sent in.

## 2023-02-28 ENCOUNTER — Telehealth: Payer: Self-pay | Admitting: Podiatry

## 2023-02-28 NOTE — Telephone Encounter (Signed)
Patient is requesting a different cream due to insurance not covering prescription. Patient is experiencing an allergic reaction from antibiotics and would like to request a different antibiotic

## 2023-03-03 ENCOUNTER — Other Ambulatory Visit: Payer: Self-pay | Admitting: Podiatry

## 2023-03-03 DIAGNOSIS — D519 Vitamin B12 deficiency anemia, unspecified: Secondary | ICD-10-CM | POA: Diagnosis not present

## 2023-03-03 DIAGNOSIS — F419 Anxiety disorder, unspecified: Secondary | ICD-10-CM | POA: Diagnosis not present

## 2023-03-03 DIAGNOSIS — I7774 Dissection of vertebral artery: Secondary | ICD-10-CM | POA: Diagnosis not present

## 2023-03-03 DIAGNOSIS — F319 Bipolar disorder, unspecified: Secondary | ICD-10-CM | POA: Diagnosis not present

## 2023-03-03 DIAGNOSIS — R531 Weakness: Secondary | ICD-10-CM | POA: Diagnosis not present

## 2023-03-03 DIAGNOSIS — F4310 Post-traumatic stress disorder, unspecified: Secondary | ICD-10-CM | POA: Diagnosis not present

## 2023-03-03 MED ORDER — MUPIROCIN 2 % EX OINT: TOPICAL_OINTMENT | CUTANEOUS | 1 refills | Status: DC

## 2023-03-03 MED ORDER — CEPHALEXIN 500 MG PO CAPS: 500.0000 mg | ORAL_CAPSULE | Freq: Three times a day (TID) | ORAL | 0 refills | Status: AC

## 2023-03-03 NOTE — Progress Notes (Signed)
There were phone calls received over the weekend from the patient regarding having a reaction to the oral antibiotic and requesting a different antibiotic, as well as her insurance not covering the topical medication that was sent in.  They specifically requested Bactroban be sent in instead even though this is an ointment (see allergies).  These were sent into her pharmacy this morning.

## 2023-03-06 DIAGNOSIS — I7774 Dissection of vertebral artery: Secondary | ICD-10-CM | POA: Diagnosis not present

## 2023-03-06 DIAGNOSIS — D519 Vitamin B12 deficiency anemia, unspecified: Secondary | ICD-10-CM | POA: Diagnosis not present

## 2023-03-06 DIAGNOSIS — F319 Bipolar disorder, unspecified: Secondary | ICD-10-CM | POA: Diagnosis not present

## 2023-03-06 DIAGNOSIS — F4310 Post-traumatic stress disorder, unspecified: Secondary | ICD-10-CM | POA: Diagnosis not present

## 2023-03-06 DIAGNOSIS — F419 Anxiety disorder, unspecified: Secondary | ICD-10-CM | POA: Diagnosis not present

## 2023-03-06 DIAGNOSIS — R531 Weakness: Secondary | ICD-10-CM | POA: Diagnosis not present

## 2023-03-07 DIAGNOSIS — G8929 Other chronic pain: Secondary | ICD-10-CM | POA: Diagnosis not present

## 2023-03-07 DIAGNOSIS — F4310 Post-traumatic stress disorder, unspecified: Secondary | ICD-10-CM | POA: Diagnosis not present

## 2023-03-07 DIAGNOSIS — D519 Vitamin B12 deficiency anemia, unspecified: Secondary | ICD-10-CM | POA: Diagnosis not present

## 2023-03-07 DIAGNOSIS — R109 Unspecified abdominal pain: Secondary | ICD-10-CM | POA: Diagnosis not present

## 2023-03-07 DIAGNOSIS — F419 Anxiety disorder, unspecified: Secondary | ICD-10-CM | POA: Diagnosis not present

## 2023-03-07 DIAGNOSIS — S81801D Unspecified open wound, right lower leg, subsequent encounter: Secondary | ICD-10-CM | POA: Diagnosis not present

## 2023-03-07 DIAGNOSIS — F319 Bipolar disorder, unspecified: Secondary | ICD-10-CM | POA: Diagnosis not present

## 2023-03-07 DIAGNOSIS — I7774 Dissection of vertebral artery: Secondary | ICD-10-CM | POA: Diagnosis not present

## 2023-03-13 ENCOUNTER — Encounter: Payer: Self-pay | Admitting: Internal Medicine

## 2023-03-13 ENCOUNTER — Other Ambulatory Visit: Payer: Self-pay

## 2023-03-13 ENCOUNTER — Ambulatory Visit (INDEPENDENT_AMBULATORY_CARE_PROVIDER_SITE_OTHER): Payer: Medicare Other | Admitting: Internal Medicine

## 2023-03-13 VITALS — BP 136/86 | HR 117 | Resp 16 | Ht 60.0 in | Wt 122.2 lb

## 2023-03-13 DIAGNOSIS — R21 Rash and other nonspecific skin eruption: Secondary | ICD-10-CM | POA: Diagnosis not present

## 2023-03-13 NOTE — Progress Notes (Addendum)
 [facility]  Reason for Consult:rash LLE Referring Provider: Mallipeddi, Vishnu    Patient Active Problem List   Diagnosis Date Noted   Right leg weakness 11/06/2022   Spotting 11/06/2022   Vertebral artery dissection (HCC) 11/03/2022   Anxiety and depression 10/29/2022   Vitamin B12 deficiency 10/15/2022   Bradycardia 08/27/2022   Autonomic dysfunction 08/27/2022   History of enlargement of pituitary gland 12/25/2017   Moderate malnutrition (HCC) 12/25/2017   Loss of weight 12/25/2017   Vitamin D  deficiency 12/25/2017   Cervical spondylosis 11/27/2017   Fatigue 11/27/2017   Family history of lupus erythematosus 11/27/2017   Thoracic spondylosis without myelopathy 11/27/2017   Ingrown toenail 11/23/2015   Dyspepsia 08/31/2014   Abdominal pain 06/29/2014   Achilles tendinitis of both lower extremities 01/23/2014   Palpitations 04/19/2013   Chest pain 04/19/2013   Sinus tachycardia 04/19/2013   Anxiety 04/19/2013      HPI: Traci Hensley is a 33 y.o. female hx vertebral artery dissection and a chronic left leg wound referred here for same  I reviewed epic charts 02/18/23 -- ed visit at [facility] for 3 week left leg wound -- patient doesn't recall how she got the wound. Had vertebral artery dissection a few months prior (10/2022); repeat brain mra 01/2023 showed healing right vertebral artery dissection and continued on aspirin  with neurology f/u. Exam finding at that time described multiple abrasions noted to LLE and full thickness wound to lateral aspect that is open and very tender to touch given keflex  and referred to wound clinic   12/17 and 03/03/23 podiatry visit -- reports topical abx ointment use as well. Not yet seen wound clinic. Telfa dressing/gauze wrap use. Reports several allergy to tape and petrolatum. Given hydrocortisone-zinc  and doxycycline .   03/13/2023 id clinic initial visit Patient reports she was planting some  mulberry bush at the time this happens. [place] farm with chickens/ducks/cats. There are dogs/cows on nearby properties Patient has fever off and on; last fever a few weeks as high as 107, but many readings are 101. She showed me several pictures. There is almost like a road burn and a clear blister on the distal lateral aspect of her leg. She has a tatoo there that has been there a long time. Then the rash spread circunferentially and upward over the next several weeks with some ulcerates/opens up. Yellowish thin fluid mostly for exudates She reports developing an allergy to doxy so had continued cephalexin  --> finished today 20 days She used topical silvadene  and bactroban  at first when the initial wound open, but then the other lesion would occur at a completely new area.   She is also on prednisone  chronically 5 mg daily for her joint pain given by pcp. She had done dose packs at higher dose at most twice a year    Review of Systems: ROS All other ros negative      Past Medical History:  Diagnosis Date   Agoraphobia    Anxiety    B12 deficiency anemia    Bipolar 1 disorder (HCC)    Chronic abdominal pain    Chronic chest pain    Depression    Dystonia    Unknown which type   PTSD (post-traumatic stress disorder)    Scoliosis    Vertebral artery dissection (HCC) 11/03/2022    Social History   Tobacco Use   Smoking status: Some Days    Current packs/day:  0.00    Types: Cigarettes    Start date: 02/15/2012    Last attempt to quit: 02/14/2014    Years since quitting: 9.0   Smokeless tobacco: Never   Tobacco comments:    rare, social smoker  Vaping Use   Vaping status: Former  Substance Use Topics   Alcohol use: Not Currently   Drug use: No    Family History  Problem Relation Age of Onset   Arrhythmia Mother    Colon cancer Neg Hx     Allergies  Allergen Reactions   Adhesive [Tape] Other (See Comments)    Eats skin almost instantly   Blue Dyes  (Parenteral) Other (See Comments)    BLUE DYES IS OK BY ITSELF, BUT CANNOT BE COMBINED WITH GREEN DYES   Contrast Media [Iodinated Contrast Media] Nausea And Vomiting    Liquid version not the IV. Causes Vomiting up blood, shaking and tremors uncontrollable, dizziness and lightheadness   Green Dyes Other (See Comments)    GREEN DYE IS OK BY ITSELF, BUT CANNOT BE COMBINED WITH BLUE DYE   Nickel Itching, Swelling, Rash and Other (See Comments)   Omnipaque  [Iohexol ]     CT tech notes state Patient allergy to IV dye   Onion     Cannot be around onion at all or she has breathing difficulty   Other Nausea Only, Rash and Other (See Comments)    Sunlight causes extreme fatigue , Migraine , Cat Dander  Any type of anesthetic causes her to have prolonged sedation, requiring more than normal doses to reverse them   Petroleum Distillate Itching, Swelling and Rash   Silicone Rash    rash   Bee Venom Hives    Severe disorientation   Yellow Jacket Venom Hives    Severe disorientation    OBJECTIVE: Vitals:   03/13/23 0852  Resp: 16  Weight: 122 lb 3.2 oz (55.4 kg)  Height: 5' (1.524 m)   Body mass index is 23.87 kg/m.   Physical Exam General/constitutional: no distress, pleasant HEENT: Normocephalic, PER, Conj Clear, EOMI, Oropharynx clear Neck supple CV: rrr no mrg Lungs: clear to auscultation, normal respiratory effort Abd: Soft, Nontender Ext: no edema Skin: several superficial rash as below; tender to touch of calf Neuro: nonfocal MSK: no peripheral joint swelling/tenderness/warmth; back spines nontender  03/13/2023          Lab: Lab Results  Component Value Date   WBC 9.9 01/18/2023   HGB 15.0 01/18/2023   HCT 45.2 01/18/2023   MCV 93.0 01/18/2023   PLT 295 01/18/2023   Last metabolic panel Lab Results  Component Value Date   GLUCOSE 133 (H) 01/18/2023   NA 137 01/18/2023   K 3.9 01/18/2023   CL 102 01/18/2023   CO2 23 01/18/2023   BUN 14 01/18/2023    CREATININE 0.56 01/18/2023   GFRNONAA >60 01/18/2023   CALCIUM  9.1 01/18/2023   PROT 8.0 01/18/2023   ALBUMIN 4.7 01/18/2023   BILITOT 0.4 01/18/2023   ALKPHOS 51 01/18/2023   AST 17 01/18/2023   ALT 14 01/18/2023   ANIONGAP 12 01/18/2023    Microbiology:  Serology:  Imaging:   Assessment/plan: Problem List Items Addressed This Visit   None Visit Diagnoses       Rash    -  Primary   Relevant Orders   C-reactive protein   Sedimentation rate   CBC w/Diff   COMPLETE METABOLIC PANEL WITH GFR   Blood culture (routine single)  Blood culture (routine single)        Chronic pred 5 mg not high dose so not considered immunosuppressed Use of topical abx could cause contact dermatitis but lesions per her report occur in new area not related to topical medication use With exposure and clinical history suggest ntm/nocardia/nocardia The rash is rather superficial  Would stop all systemic and topical antibiotics or any other topical stuffs at this time  She will see wound care next week and we can ask for skin biopsy and AFB/fungal culture at the edge of the wounds (2 places ideally -- one with the most recent occurring wound and one with the most severe appearing wound), and if possible uw 16s for bacteria/afb/fungal The sample for culture/rna testing would need to be in sterile saline  F/u with me in 3-4 weeks. Again no abx   Chronic pred/joint pain recommends ongoing rheumatology management   Basic labs including blood cx today given fever. Advise her to record vitals for me  Forwarding chart to Harlene Eastern of wound care regarding the testing/biopsy    Follow-up: Return in about 5 weeks (around 04/17/2023).  Constance ONEIDA Passer, MD [facility] 03/13/2023, 8:58 AM

## 2023-03-13 NOTE — Patient Instructions (Addendum)
 With what you are telling me, doesn't appear to be purely due to potential contact dermatitis, but I worry about nocardia or atypical mycobacterial infection from the initial shovel injury  These two organisms don't respond to single antibiotics well and often needs at least 2-3 antibiotics and several months of treatment. But because they are very resistant to antibiotics we'll need good cultures   Stop all topical and systemic antibiotics or other topical supplement   When you see wound care. I recommend biopsies at the edge of 2 wounds -- one with the most recent occurring wound and one with the most severe appearing wound For the testings -- I request bacteria, AFB and fungal cultures along with pathology. If your wound care clinic has ability to send for [facility]  16s rna testing I would request that as well and asking for bacteria, AFB and fungal testing The culture and RNA testing would need to be in sterile saline  As these organisms take a few weeks to grow, see me in around 4-6 weeks

## 2023-03-17 ENCOUNTER — Encounter (HOSPITAL_BASED_OUTPATIENT_CLINIC_OR_DEPARTMENT_OTHER): Payer: Medicare Other | Attending: Internal Medicine | Admitting: Internal Medicine

## 2023-03-17 DIAGNOSIS — L308 Other specified dermatitis: Secondary | ICD-10-CM | POA: Diagnosis present

## 2023-03-17 DIAGNOSIS — L98499 Non-pressure chronic ulcer of skin of other sites with unspecified severity: Secondary | ICD-10-CM | POA: Diagnosis not present

## 2023-03-18 LAB — CULTURE, BLOOD (SINGLE)
MICRO NUMBER:: 15910887
MICRO NUMBER:: 15910889
SPECIMEN QUALITY:: ADEQUATE
SPECIMEN QUALITY:: ADEQUATE

## 2023-03-18 LAB — CBC WITH DIFFERENTIAL/PLATELET
Absolute Lymphocytes: 2121 {cells}/uL (ref 850–3900)
Absolute Monocytes: 505 {cells}/uL (ref 200–950)
Basophils Absolute: 40 {cells}/uL (ref 0–200)
Basophils Relative: 0.4 %
Eosinophils Absolute: 61 {cells}/uL (ref 15–500)
Eosinophils Relative: 0.6 %
HCT: 41.5 % (ref 35.0–45.0)
Hemoglobin: 13.8 g/dL (ref 11.7–15.5)
MCH: 30.3 pg (ref 27.0–33.0)
MCHC: 33.3 g/dL (ref 32.0–36.0)
MCV: 91.2 fL (ref 80.0–100.0)
MPV: 10.6 fL (ref 7.5–12.5)
Monocytes Relative: 5 %
Neutro Abs: 7373 {cells}/uL (ref 1500–7800)
Neutrophils Relative %: 73 %
Platelets: 290 10*3/uL (ref 140–400)
RBC: 4.55 10*6/uL (ref 3.80–5.10)
RDW: 11.2 % (ref 11.0–15.0)
Total Lymphocyte: 21 %
WBC: 10.1 10*3/uL (ref 3.8–10.8)

## 2023-03-18 LAB — COMPLETE METABOLIC PANEL WITH GFR
AG Ratio: 1.5 (calc) (ref 1.0–2.5)
ALT: 9 U/L (ref 6–29)
AST: 13 U/L (ref 10–30)
Albumin: 4.8 g/dL (ref 3.6–5.1)
Alkaline phosphatase (APISO): 51 U/L (ref 31–125)
BUN: 13 mg/dL (ref 7–25)
CO2: 26 mmol/L (ref 20–32)
Calcium: 9.8 mg/dL (ref 8.6–10.2)
Chloride: 102 mmol/L (ref 98–110)
Creat: 0.61 mg/dL (ref 0.50–0.97)
Globulin: 3.2 g/dL (ref 1.9–3.7)
Glucose, Bld: 88 mg/dL (ref 65–99)
Potassium: 3.8 mmol/L (ref 3.5–5.3)
Sodium: 139 mmol/L (ref 135–146)
Total Bilirubin: 0.3 mg/dL (ref 0.2–1.2)
Total Protein: 8 g/dL (ref 6.1–8.1)
eGFR: 123 mL/min/{1.73_m2} (ref >=60)

## 2023-03-18 LAB — C-REACTIVE PROTEIN: CRP: <3 mg/L (ref <8.0)

## 2023-03-18 LAB — SEDIMENTATION RATE: Sed Rate: 6 mm/h (ref 0–20)

## 2023-03-20 NOTE — Progress Notes (Signed)
 Hensley, Traci N ([id number]) [id number] Page 1 of 3 Visit Report for 03/17/2023 Abuse Risk Screen Details Patient Name: Date of Service: Traci Hensley, Traci Hensley 03/17/2023 12:45 PM Medical Record Number: [id number] Patient Account Number: [id number] Date of Birth/Sex: Treating RN: Apr 15, 1990 (32 y.o. Traci Hensley [name] Elora Wolter: Traci [employer]ARLOT [employer]A SERVANT Other Clinician: Referring Bartosz Luginbill: Treating Ugonna Keirsey/Extender: Rosan Raisin Traci LLIPEDDI, V ISHNU Weeks in Treatment: 0 Abuse Risk Screen Items Answer ABUSE RISK SCREEN: Has anyone close to you tried to hurt or harm you recentlyo No Do you feel uncomfortable with anyone in your familyo No Has anyone forced you do things that you didnt want to doo No Electronic Signature(s) Signed: 03/19/2023 5:28:22 PM By: Hensley [name] RN, BSN Entered By: Hensley [name] on 03/17/2023 12:48:58 -------------------------------------------------------------------------------- Activities of Daily Living Details Patient Name: Date of Service: Traci Hensley 03/17/2023 12:45 PM Medical Record Number: [id number] Patient Account Number: [id number] Date of Birth/Sex: Treating RN: 1990/04/15 (32 y.o. Traci Hensley [name] Braidyn Peace: Traci [employer]OT [employer] SERVANT Other Clinician: Referring Alesa Echevarria: Treating Mattis Featherly/Extender: Rosan Raisin Traci LLIPEDDI, V ISHNU Weeks in Treatment: 0 Activities of Daily Living Items Answer Activities of Daily Living (Please select one for each item) Drive Automobile Completely Able T Medications ake Completely Able Use T elephone Completely Able Care for Appearance Completely Able Use T oilet Completely Able Bath / Shower Completely Able Dress Self Completely Able Feed Self Completely Able Walk Completely Able Get In / Out Bed Completely Able Housework Completely Able Prepare Meals Completely Able Handle Money Completely Able Shop for Self Completely Able Electronic  Signature(s) Signed: 03/19/2023 5:28:22 PM By: Hensley [name] RN, BSN Entered By: Hensley [name] on 03/17/2023 12:49:16 Traci Hensley ([id number]) [id number] Page 2 of 3 -------------------------------------------------------------------------------- Education Screening Details Patient Name: Date of Service: Traci Hensley 03/17/2023 12:45 PM Medical Record Number: [id number] Patient Account Number: [id number] Date of Birth/Sex: Treating RN: Feb 09, 1991 (32 y.o. Traci Hensley [name] Youssef Footman: Traci [employer]ARLOT [employer] SERVANT Other Clinician: Referring Vashti Bolanos: Treating Morrison Masser/Extender: Rosan Raisin Traci LLIPEDDI, V ISHNU Weeks in Treatment: 0 Primary Learner Assessed: Patient Learning Preferences/Education Level/Primary Language Learning Preference: Explanation, Demonstration, Printed Material Highest Education Level: High School Preferred Language: English Cognitive Barrier Language Barrier: No Translator Needed: No Memory Deficit: No Emotional Barrier: No Cultural/Religious Beliefs Affecting Medical Care: No Physical Barrier Impaired Vision: No Impaired Hearing: No Decreased Hand dexterity: No Knowledge/Comprehension Knowledge Level: High Comprehension Level: High Ability to understand written instructions: High Ability to understand verbal instructions: High Motivation Anxiety Level: Calm Cooperation: Cooperative Education Importance: Acknowledges Need Interest in Health Problems: Asks Questions Perception: Coherent Willingness to Engage in Self-Management High Activities: Readiness to Engage in Self-Management High Activities: Electronic Signature(s) Signed: 03/19/2023 5:28:22 PM By: Hensley [name] RN, BSN Entered By: Hensley [name] on 03/17/2023 12:49:42 -------------------------------------------------------------------------------- Fall Risk Assessment Details Patient Name: Date of Service: Traci Hensley 03/17/2023 12:45  PM Medical Record Number: [id number] Patient Account Number: [id number] Date of Birth/Sex: Treating RN: 02/20/1991 (32 y.o. Traci Hensley [name] Santosha Jividen: Traci [employer]OT [employer] SERVANT Other Clinician: Referring Erik Burkett: Treating Berdine Rasmusson/Extender: Rosan Raisin Traci LLIPEDDI, V ISHNU Weeks in Treatment: 0 Fall Risk Assessment Items Have you had 2 or more falls in the last [address] Traci Hensley ([id number]) [phone] [id number] Page 3 of 3 Have you had any fall that resulted in injury in the last 12 monthso 0 No FALLS RISK SCREEN History of falling - immediate or within 3 months 0  No Secondary diagnosis (Do you have 2 or more medical diagnoseso) 0 No Ambulatory aid None/bed rest/wheelchair/nurse 0 Yes Crutches/cane/walker 0 No Furniture 0 No Intravenous therapy Access/Saline/Heparin  Lock 0 No Gait/Transferring Normal/ bed rest/ wheelchair 0 Yes Weak (short steps with or without shuffle, stooped but able to lift head while walking, may seek 0 No support from furniture) Impaired (short steps with shuffle, may have difficulty arising from chair, head down, impaired 0 No balance) Mental Status Oriented to own ability 0 Yes Electronic Signature(s) Signed: 03/19/2023 5:28:22 PM By: Hensley [name] RN, BSN Entered By: Hensley [name] on 03/17/2023 12:49:54 -------------------------------------------------------------------------------- Nutrition Risk Screening Details Patient Name: Date of Service: Traci Hensley 03/17/2023 12:45 PM Medical Record Number: [id number] Patient Account Number: [id number] Date of Birth/Sex: Treating RN: 1990/10/09 (32 y.o. Traci Hensley, Traci Hensley Primary [facility] Jaasiel [place]: Traci [employer]OT [employer] SERVANT Other Clinician: Referring Jhoselyn Ruffini: Treating Glendora Clouatre/Extender: Rosan Raisin Traci LLIPEDDI, V ISHNU Weeks in Treatment: 0 Height (in): 60 Weight (lbs): 122 Body Mass Index (BMI): 23.8 Nutrition Risk Screening Items Score  Screening NUTRITION RISK SCREEN: I have an illness or condition that made me change the kind and/or amount of food I eat 2 Yes I eat fewer than two meals per day 0 No I eat few fruits and vegetables, or milk products 0 No I have three or more drinks of beer, liquor or wine almost every day 0 No I have tooth or mouth problems that make it hard for me to eat 0 No I don't always have enough money to buy the food I need 0 No I eat alone most of the time 0 No I take three or more different prescribed or over-the-counter drugs a day 1 Yes Without wanting to, I have lost or gained 10 pounds in the last six months 0 No I am not always physically able to shop, cook and/or feed myself 0 No Nutrition Protocols Good Risk Protocol Moderate Risk Protocol 0 Provide education on nutrition High Risk Proctocol Risk Level: Moderate Risk Score: 3 Electronic Signature(s) Signed: 03/19/2023 5:28:22 PM By: Hensley [name] RN, BSN Entered By: Hensley [name] on 03/17/2023 12:50:07

## 2023-03-20 NOTE — Progress Notes (Signed)
 Hensley, Traci N ([id number]) [id number] Page 1 of 8 Visit Report for 03/17/2023 Chief Complaint Document Details Patient Name: Date of Service: Traci Hensley, Traci Hensley 03/17/2023 12:45 PM Medical Record Number: [id number] Patient Account Number: [id number] Date of Birth/Sex: Treating RN: [birth year] (32 y.o. F) Primary Care Provider: MA SHARLOT LULLA SERVANT Other Clinician: Referring Provider: Treating Provider/Extender: Traci Raisin MA LLIPEDDI, V ISHNU Weeks in Treatment: 0 Information Obtained from: Patient Chief Complaint 03/17/2023; Rash Electronic Signature(s) Signed: 03/19/2023 5:26:35 PM By: Traci Raisin DO Entered By: Traci Hensley on 03/17/2023 15:21:52 -------------------------------------------------------------------------------- HPI Details Patient Name: Date of Service: Traci Hensley 03/17/2023 12:45 PM Medical Record Number: [id number] Patient Account Number: [id number] Date of Birth/Sex: Treating RN: 11/27/1990 (32 y.o. F) Primary Care Provider: MA SHARLOT LULLA SERVANT Other Clinician: Referring Provider: Treating Provider/Extender: Traci Raisin MA LLIPEDDI, V ISHNU Weeks in Treatment: 0 History of Present Illness HPI Description: 03/17/2023 Ms. Traci Hensley is a 33 year old female with a past medical history of vertebral artery dissection and autonomic dysfunction that presents to the clinic for a 7-week history of rash to her left lower extremity. She visited the ED on 02/18/23 for this issue and was started on Keflex  for potential cellulitis. She finished this course. She also saw podiatry on 12/17 that started her on doxycycline  and recommended a compounded ointment mixture of hydrocortisone and nystatin. Unfortunately her insurance did not cover the compounded ointment and she did not try this. She did complete both Keflex  and doxycycline  with some benefit. She recently saw Dr. Overton (Infectious disease) on 03/13/2023 and recommended  stopping antibiotics. He obtained C-reactive protein, sed rate, CBC with differential and CMP. He recommended a skin biopsy and culture. Currently patient states that the rash is limited to the left leg but she occasionally has little spots appear on her chest and arm. The rash on her left leg waxes and wanes. She denies increased warmth, erythema or purulent drainage. She denies pruritus but reports a burning sensation. She also reports muscle weakness and pain to her lower extremities bilaterally. She does not have a dermatologist or rheumatologist. She states she is on 5 mg of prednisone  for muscle/joint pain. Electronic Signature(s) Signed: 03/19/2023 5:26:35 PM By: Traci Raisin DO Entered By: Traci Hensley on 03/17/2023 16:05:52 -------------------------------------------------------------------------------- Physical Exam Details Patient Name: Date of Service: Traci Hensley 03/17/2023 12:45 PM Urbieta, Lene N ([id number]) [id number] Page 2 of 8 Medical Record Number: [id number] Patient Account Number: [id number] Date of Birth/Sex: Treating RN: [birth year] (32 y.o. F) Primary Care Provider: MA SHARLOT LULLA SERVANT Other Clinician: Referring Provider: Treating Provider/Extender: Traci Raisin MA LLIPEDDI, V ISHNU Weeks in Treatment: 0 Constitutional respirations regular, non-labored and within target range for patient.. Cardiovascular 2+ dorsalis pedis/posterior tibialis pulses. Psychiatric pleasant and cooperative. Notes Multiple areas of skin breakdown to the left lower extremity. All areas of breakdown are superficial. Most proximal area with weeping noted. No signs of surrounding infection including increased warmth, erythema or purulent drainage. Electronic Signature(s) Signed: 03/19/2023 5:26:35 PM By: Traci Raisin DO Entered By: Traci Hensley on 03/17/2023  15:53:23 -------------------------------------------------------------------------------- Physician Orders Details Patient Name: Date of Service: Traci Hensley 03/17/2023 12:45 PM Medical Record Number: [id number] Patient Account Number: [id number] Date of Birth/Sex: Treating RN: 1990/04/03 (32 y.o. Traci Hensley Primary Care Provider: MA SHARLOT LULLA SERVANT Other Clinician: Referring Provider: Treating Provider/Extender: Traci Raisin MA LLIPEDDI, V ISHNU Weeks in Treatment: 0 The following information was scribed by: Drury Hensley The information was scribed for: Traci Hensley Verbal /  Phone Orders: No Diagnosis Coding Follow-up Appointments ppointment in 1 week. - Dr. Rosan (front office to schedule) Return A Other: - Rheumatologist will call you to schedule an appt. Will call you when the culture results return. Pick up topical steroid cream from your pharmacy. Anesthetic (In clinic) Topical Lidocaine  4% applied to wound bed Bathing/ Shower/ Hygiene May shower and wash wound with soap and water. Wound Treatment Wound #1 - Lower Leg Wound Laterality: Left, Lateral Cleanser: Soap and Water 1 x Per Day/30 Days Discharge Instructions: May shower and wash wound with dial antibacterial soap and water prior to dressing change. Topical: Triamcinolone 1 x Per Day/30 Days Discharge Instructions: Apply Triamcinolone as directed Compression Wrap: tubigrip size D one layer 1 x Per Day/30 Days Discharge Instructions: wear daily. Wound #2 - Lower Leg Wound Laterality: Left, Anterior Cleanser: Soap and Water 1 x Per Day/30 Days Discharge Instructions: May shower and wash wound with dial antibacterial soap and water prior to dressing change. Topical: Triamcinolone 1 x Per Day/30 Days Discharge Instructions: Apply Triamcinolone as directed Hensley, Traci N ([id number]) [id number] Page 3 of 8 Compression Wrap: tubigrip size D one layer 1 x Per Day/30  Days Discharge Instructions: wear daily. Wound #3 - Lower Leg Wound Laterality: Left, Medial Cleanser: Soap and Water 1 x Per Day/30 Days Discharge Instructions: May shower and wash wound with dial antibacterial soap and water prior to dressing change. Topical: Triamcinolone 1 x Per Day/30 Days Discharge Instructions: Apply Triamcinolone as directed Compression Wrap: tubigrip size D one layer 1 x Per Day/30 Days Discharge Instructions: wear daily. Consults rheumatology - referral to rheumatology due to muscle pain and pain in joints. currently on prednisone . Difficult time regulating temperature. Numbness and tingling to both hands. Laboratory erobe culture (MICRO) - culture PCR Vikor taken to left lateral lower leg. Bacteria identified in Unspecified specimen by A LOINC Code: 634-6 Convenience Name: Aerobic culture-specimen not specified Patient Medications llergies: adhesive, blue dye, green dye, Iodinated Contrast Media, nickel, Omnipaque , onion, sunlight, petroleum distillates, silicone, bee venom protein A (honey bee) Notifications Medication Indication Start End applied only in clinic for1/08/2023 lidocaine  any debridement. DOSE topical 4 % cream - cream topical 03/17/2023 triamcinolone acetonide DOSE 1 - topical 0.1 % cream - Apply once daily to the wounds Electronic Signature(s) Signed: 03/19/2023 5:26:35 PM By: Traci Raisin DO Previous Signature: 03/17/2023 1:38:08 PM Version By: Traci Raisin DO Entered By: Traci Hensley on 03/17/2023 15:53:44 Prescription 03/17/2023 -------------------------------------------------------------------------------- [name]. Traci Raisin DO Patient Name: Provider: 1990-03-17 [id number] Date of Birth: NPI#: [id number] Sex: DEA #: [phone] Phone #: License #: ROBERTO: Patient Address: [address] [facility] [place], [place] [address] [address]  Floor [place], [place] [address] [phone] Allergies adhesive; blue dye; green dye; Iodinated Contrast Media; nickel; Omnipaque ; onion; sunlight; petroleum distillates; silicone; bee venom protein (honey bee) Provider's Orders rheumatology - referral to rheumatology due to muscle pain and pain in joints. currently on prednisone . Difficult time regulating temperature. Numbness and tingling to both hands. Hand Signature: Date(s): Woodfin, Adonia N ([id number]) [id number] Page 4 of 8 Electronic Signature(s) Signed: 03/19/2023 5:26:35 PM By: Traci Raisin DO Entered By: Traci Hensley on 03/17/2023 15:53:44 -------------------------------------------------------------------------------- Problem List Details Patient Name: Date of Service: Traci SEABRON DELAND GEANNIE 03/17/2023 12:45 PM Medical Record Number: [id number] Patient Account Number: [id number] Date of Birth/Sex: Treating RN: November 24, 1990 (32 y.o. F) Primary Care Provider: MA SHARLOT LULLA SERVANT Other Clinician: Referring Provider: Treating Provider/Extender:  Traci Raisin MA LLIPEDDI, V ISHNU Weeks in Treatment: 0 Active Problems ICD-10 Encounter Code Description Active Date MDM Diagnosis L98.499 Non-pressure chronic ulcer of skin of other sites with unspecified severity 03/17/2023 No Yes L30.8 Other specified dermatitis 03/17/2023 No Yes Inactive Problems Resolved Problems Electronic Signature(s) Signed: 03/19/2023 5:26:35 PM By: Traci Raisin DO Entered By: Traci Hensley on 03/17/2023 15:21:18 -------------------------------------------------------------------------------- Progress Note Details Patient Name: Date of Service: MA SEABRON DELAND Hensley 03/17/2023 12:45 PM Medical Record Number: [id number] Patient Account Number: [id number] Date of Birth/Sex: Treating RN: 08/16/1990 (32 y.o. F) Primary Care Provider: MA SHARLOT LULLA SERVANT Other Clinician: Referring Provider: Treating Provider/Extender: Traci Raisin MA LLIPEDDI, V ISHNU Weeks in Treatment: 0 Subjective Chief Complaint Information obtained from Patient 03/17/2023; Rash History of Present Illness (HPI) 03/17/2023 Ms. Traci Hensley is a 33 year old female with a past medical history of vertebral artery dissection and autonomic dysfunction that presents to the clinic for a 7-week history of rash to her left lower extremity. She visited the ED on 02/18/23 for this issue and was started on Keflex  for potential cellulitis. She finished this course. She also saw podiatry on 12/17 that started her on doxycycline  and recommended a compounded ointment mixture of hydrocortisone and [place], Sherah N ([id number]) [id number] Page 5 of 8 nystatin. Unfortunately her insurance did not cover the compounded ointment and she did not try this. She did complete both Keflex  and doxycycline  with some benefit. She recently saw Dr. Overton (Infectious disease) on 03/13/2023 and recommended stopping antibiotics. He obtained C-reactive protein, sed rate, CBC with differential and CMP. He recommended a skin biopsy and culture. Currently patient states that the rash is limited to the left leg but she occasionally has little spots appear on her chest and arm. The rash on her left leg waxes and wanes. She denies increased warmth, erythema or purulent drainage. She denies pruritus but reports a burning sensation. She also reports muscle weakness and pain to her lower extremities bilaterally. She does not have a dermatologist or rheumatologist. She states she is on 5 mg of prednisone  for muscle/joint pain. Patient History Allergies adhesive, blue dye, green dye (Reaction: do not combo with blue dye; can have separate.), Iodinated Contrast Media (Reaction: N/V), nickel, Omnipaque , onion, sunlight (Reaction: fatigue, migraine, cat dander), petroleum distillates (Reaction: rash), silicone (Reaction: rash), bee venom protein (honey bee) (Reaction:  hives) Family History Heart Disease - Mother. Social History Current some day smoker, Marital Status - Single, Alcohol Use - Never, Drug Use - No History, Caffeine Use - Never. Medical History Hematologic/Lymphatic Patient has history of Anemia - B12 deficient; Hospitalization/Surgery History - 11/03/22 vertebral artery dissection. - mouth surgery. - CVA 8/24. Medical A Surgical History Notes nd Constitutional Symptoms (General Health) Agoraphobia [place] 8/24 Hematologic/Lymphatic vitamin D  deficient Cardiovascular vertebral artery dissection 8/24 Musculoskeletal scoliosis cervical spondylosis Psychiatric PTSD Objective Constitutional respirations regular, non-labored and within target range for patient.. Vitals Time Taken: 12:43 PM, Height: 60 in, Source: Stated, Weight: 122 lbs, Source: Stated, BMI: 23.8, Temperature: 98.3 F, Pulse: 118 bpm, Respiratory Rate: 18 breaths/min, Blood Pressure: 145/90 mmHg. Cardiovascular 2+ dorsalis pedis/posterior tibialis pulses. Psychiatric pleasant and cooperative. General Notes: Multiple areas of skin breakdown to the left lower extremity. All areas of breakdown are superficial. Most proximal area with weeping noted. No signs of surrounding infection including increased warmth, erythema or purulent drainage. Integumentary (Hair, Skin) Wound #1 status is Open. Original cause of wound was Gradually Appeared. The date acquired was: 02/18/2023. The wound is located on the  Left,Lateral Lower Leg. The wound measures 1cm length x 0.3cm width x 0.1cm depth; 0.236cm^2 area and 0.024cm^3 volume. There is no tunneling or undermining noted. There is a small amount of serosanguineous drainage noted. The wound margin is distinct with the outline attached to the wound base. There is large (67-100%) red, pink, pale granulation within the wound bed. The periwound skin appearance did not exhibit: Callus, Crepitus, Excoriation, Induration, Rash, Scarring,  Dry/Scaly, Maceration, Atrophie Blanche, Cyanosis, Ecchymosis, Hemosiderin Staining, Mottled, Pallor, Rubor, Erythema. Wound #2 status is Open. Original cause of wound was Gradually Appeared. The date acquired was: 02/18/2023. The wound is located on the Left,Anterior Lower Leg. The wound measures 3cm length x 3cm width x 0.1cm depth; 7.069cm^2 area and 0.707cm^3 volume. There is no tunneling or undermining noted. There is a small amount of drainage noted. The wound margin is distinct with the outline attached to the wound base. There is large (67-100%) pink granulation within the wound bed. There is no necrotic tissue within the wound bed. The periwound skin appearance did not exhibit: Callus, Crepitus, Excoriation, Induration, Rash, Scarring, Dry/Scaly, Maceration, Atrophie Blanche, Cyanosis, Ecchymosis, Hemosiderin Staining, Mottled, Pallor, Rubor, Erythema. Wound #3 status is Open. Original cause of wound was Gradually Appeared. The date acquired was: 02/18/2023. The wound is located on the Left,Medial Lower Leg. The wound measures 3cm length x 1cm width x 0.1cm depth; 2.356cm^2 area and 0.236cm^3 volume. There is no tunneling or undermining noted. There is a small amount of serosanguineous drainage noted. The wound margin is distinct with the outline attached to the wound base. There is medium (34-66%) red granulation within the wound bed. There is a medium (34-66%) amount of necrotic tissue within the wound bed including Eschar. The periwound skin appearance did not exhibit: Callus, Crepitus, Excoriation, Induration, Rash, Scarring, Dry/Scaly, Maceration, Atrophie Blanche, Cyanosis, Ecchymosis, Hemosiderin Staining, Mottled, Pallor, Rubor, Erythema. Namba, Kaileia N ([id number]) [id number] Page 6 of 8 Assessment Active Problems ICD-10 Non-pressure chronic ulcer of skin of other sites with unspecified severity Other specified dermatitis Patient presents with a  7-week history of skin irritation thats wax and wanes with superficial breakdown to her left lower extremity. Most of the areas are healed that were previously opened. The areas are superficial and she does not have open wounds. Unclear etiology of the rash. I did obtain a PCR culture of the weeping area. No overt signs of infection however. Currently her sed rate is low. At this time I will hold off on the skin biopsy as I think this will create more issues. This is potentially eczema and we tried a steroid cream in the office and she tolerated this well. I recommended doing this daily. She would best be suited by either dermatology or rheumatology for this issue. She does state she has a history of muscle pain over the past 7 weeks. We will place a referral to rheumatology. She would like to hold off on being referred to dermatology due to insurance issues. Follow up in one week. Plan Follow-up Appointments: Return Appointment in 1 week. - Dr. Rosan (front office to schedule) Other: - Rheumatologist will call you to schedule an appt. Will call you when the culture results return. Pick up topical steroid cream from your pharmacy. Anesthetic: (In clinic) Topical Lidocaine  4% applied to wound bed Bathing/ Shower/ Hygiene: May shower and wash wound with soap and water. Consults ordered were: rheumatology - referral to rheumatology due to muscle pain and pain in joints. currently on prednisone . Difficult time regulating  temperature. Numbness and tingling to both hands. Laboratory ordered were: Aerobic culture-specimen not specified - culture PCR Vikor taken to left lateral lower leg. The following medication(s) was prescribed: lidocaine  topical 4 % cream cream topical for applied only in clinic for any debridement. was prescribed at facility triamcinolone acetonide topical 0.1 % cream 1 Apply once daily to the wounds starting 03/17/2023 WOUND #1: - Lower Leg Wound Laterality: Left,  Lateral Cleanser: Soap and Water 1 x Per Day/30 Days Discharge Instructions: May shower and wash wound with dial antibacterial soap and water prior to dressing change. Topical: Triamcinolone 1 x Per Day/30 Days Discharge Instructions: Apply Triamcinolone as directed Com pression Wrap: tubigrip size D one layer 1 x Per Day/30 Days Discharge Instructions: wear daily. WOUND #2: - Lower Leg Wound Laterality: Left, Anterior Cleanser: Soap and Water 1 x Per Day/30 Days Discharge Instructions: May shower and wash wound with dial antibacterial soap and water prior to dressing change. Topical: Triamcinolone 1 x Per Day/30 Days Discharge Instructions: Apply Triamcinolone as directed Com pression Wrap: tubigrip size D one layer 1 x Per Day/30 Days Discharge Instructions: wear daily. WOUND #3: - Lower Leg Wound Laterality: Left, Medial Cleanser: Soap and Water 1 x Per Day/30 Days Discharge Instructions: May shower and wash wound with dial antibacterial soap and water prior to dressing change. Topical: Triamcinolone 1 x Per Day/30 Days Discharge Instructions: Apply Triamcinolone as directed Com pression Wrap: tubigrip size D one layer 1 x Per Day/30 Days Discharge Instructions: wear daily. 1. TCA 2. Follow-up in 1 week 3. Rheumatology referral 4. PCR culture Electronic Signature(s) Signed: 03/19/2023 5:26:35 PM By: Traci Raisin DO Entered By: Traci Hensley on 03/17/2023 16:07:22 -------------------------------------------------------------------------------- HxROS Details Patient Name: Date of Service: MA SEABRON DELAND Hensley 03/17/2023 12:45 PM Medical Record Number: [id number] Patient Account Number: [id number] ELLESSE, ANTENUCCI ([id number]) [id number] Page 7 of 8 Date of Birth/Sex: Treating RN: [birth year] (32 y.o. Traci Hensley Primary Care Provider: Other Clinician: MA SHARLOT LULLA SERVANT Referring Provider: Treating Provider/Extender: Traci Raisin MA  LLIPEDDI, V ISHNU Weeks in Treatment: 0 Constitutional Symptoms (General Health) Medical History: Past Medical History Notes: Agoraphobia [place] 8/24 Hematologic/Lymphatic Medical History: Positive for: Anemia - B12 deficient; Past Medical History Notes: vitamin D  deficient Cardiovascular Medical History: Past Medical History Notes: vertebral artery dissection 8/24 Musculoskeletal Medical History: Past Medical History Notes: scoliosis cervical spondylosis Psychiatric Medical History: Past Medical History Notes: PTSD Immunizations Implantable Devices No devices added Hospitalization / Surgery History Type of Hospitalization/Surgery 11/03/22 vertebral artery dissection mouth surgery CVA 8/24 Family and Social History Heart Disease: Yes - Mother; Current some day smoker; Marital Status - Single; Alcohol Use: Never; Drug Use: No History; Caffeine Use: Never Social Determinants of Health (SDOH) 1. In the past 2 months, did you or others you live with eat smaller meals or skip meals because you didn't have money for foodo : No 2. Are you homeless or worried that you might be in the futureo : No 3. Do you have trouble paying for your utilities (gas, electricity, phone)o : No 4. Do you have trouble finding or paying for a rideo : No 5. Do you need daycare, or better daycare, for your kidso : No 6. Are you unemployed or without regular incomeo : No 7. Do you need help finding a better jobo : No 8. Do you need help getting more educationo : No 9. Are you concerned about someone in your home using drugs or alcoholo : No 10. Do you feel  unsafe in your daily lifeo : No 11. Is anyone in your home threatening or abusing youo : No 12. Do you lack quality relationships that make you feel valued and supportedo : No 13. Do you need help getting cultural information in a language you understando : No 14. Do you need help getting internet accesso : No Advanced Directives and  Instructions Spiritual or Cultural beliefs preclude asking about Advance Care Planning: No Advanced Directives: Yes Copy Provided: No Do not resuscitate: No Living Will: No Medical Power of Attorney: Yes; room mate- cone has a copy. Surrogate Decision Maker: No SCARLETTROSE, COSTILOW ([id number]) [id number] Page 8 of 8 Electronic Signature(s) Signed: 03/19/2023 5:26:35 PM By: Traci Raisin DO Signed: 03/19/2023 5:28:22 PM By: Drury [name] RN, BSN Entered By: Drury Hensley on 03/17/2023 12:48:54 -------------------------------------------------------------------------------- SuperBill Details Patient Name: Date of Service: Traci SEABRON DELAND GEANNIE 03/17/2023 Medical Record Number: [id number] Patient Account Number: [id number] Date of Birth/Sex: Treating RN: 1990/07/19 (32 y.o. Traci Hensley Primary Care Provider: MA SHARLOT LULLA SERVANT Other Clinician: Referring Provider: Treating Provider/Extender: Traci Raisin MA LLIPEDDI, V ISHNU Weeks in Treatment: 0 Diagnosis Coding ICD-10 Codes Code Description L98.499 Non-pressure chronic ulcer of skin of other sites with unspecified severity L30.8 Other specified dermatitis Facility Procedures : CPT4 Code: 23899859 Description: 00784 - WOUND CARE VISIT-LEV 5 EST PT Modifier: Quantity: 1 Physician Procedures : CPT4 Code Description Modifier [id number] - WC PHYS LEVEL 4 - NEW PT ICD-10 Diagnosis Description L98.499 Non-pressure chronic ulcer of skin of other sites with unspecified severity L30.8 Other specified dermatitis Quantity: 1 Electronic Signature(s) Signed: 03/19/2023 5:26:35 PM By: Traci Raisin DO Entered By: Traci Hensley on 03/17/2023 16:07:40

## 2023-03-24 ENCOUNTER — Encounter (HOSPITAL_BASED_OUTPATIENT_CLINIC_OR_DEPARTMENT_OTHER): Payer: Medicare Other | Admitting: Internal Medicine

## 2023-03-24 DIAGNOSIS — L308 Other specified dermatitis: Secondary | ICD-10-CM | POA: Diagnosis not present

## 2023-03-24 DIAGNOSIS — L98499 Non-pressure chronic ulcer of skin of other sites with unspecified severity: Secondary | ICD-10-CM

## 2023-03-26 NOTE — Progress Notes (Signed)
 Mousel, Ares N ([id number]) [id number] Page 1 of 12 Visit Report for 03/24/2023 Arrival Information Details Patient Name: Date of Service: [place] WYLENE, WEISSMAN 03/24/2023 12:30 PM Medical Record Number: [id number] Patient Account Number: [id number] Date of Birth/Sex: Treating RN: 1990/09/20 (33 y.o. [name] Cammie Sailors Primary Care Margarita Bobrowski: DEL O RBE PO LA NCO, ILIA NA Other Clinician: Referring Isauro Skelley: Treating Mitali Shenefield/Extender: Rosan Raisin MA LLIPEDDI, V ISHNU Weeks in Treatment: 1 Visit Information History Since Last Visit Added or deleted any medications: No Patient Arrived: Ambulatory Any new allergies or adverse reactions: No Arrival Time: 12:52 Had a fall or experienced change in Yes Accompanied By: Self activities of daily living that may affect Transfer Assistance: None risk of falls: Patient Identification Verified: Yes Signs or symptoms of abuse/neglect since last visito No Secondary Verification Process Completed: Yes Hospitalized since last visit: No Patient Requires Transmission-Based Precautions: No Implantable device outside of the clinic excluding No Patient Has Alerts: No cellular tissue based products placed in the center since last visit: Has Dressing in Place as Prescribed: No Has Compression in Place as Prescribed: No Pain Present Now: Yes Electronic Signature(s) Signed: 03/24/2023 5:06:40 PM By: Cammie Sailors RN, BSN Entered By: Cammie Sailors on 03/24/2023 12:53:08 -------------------------------------------------------------------------------- Clinic Level of Care Assessment Details Patient Name: Date of Service: MA LUMI, WINSLETT 03/24/2023 12:30 PM Medical Record Number: [id number] Patient Account Number: [id number] Date of Birth/Sex: Treating RN: [birth year] (33 y.o. [name] Cammie Sailors Primary Care Avarose Mervine: DEL O RBE PO LA NCO, ILIA NA Other Clinician: Referring Eulas Schweitzer: Treating Lakeshia Dohner/Extender: Rosan Raisin MA LLIPEDDI, V ISHNU Weeks in Treatment: 1 Clinic Level of Care Assessment Items TOOL 4 Quantity Score X- 1 0 Use when only an EandM is performed on FOLLOW-UP visit ASSESSMENTS - Nursing Assessment / Reassessment X- 1 10 Reassessment of Co-morbidities (includes updates in patient status) X- 1 5 Reassessment of Adherence to Treatment Plan ASSESSMENTS - Wound and Skin A ssessment / Reassessment []  - 0 Simple Wound Assessment / Reassessment - one wound X- 2 5 Complex Wound Assessment / Reassessment - multiple wounds []  - 0 Dermatologic / Skin Assessment (not related to wound area) ASSESSMENTS - Focused Assessment X- 1 5 Circumferential Edema Measurements - multi extremities []  - 0 Nutritional Assessment / Counseling / Intervention JHERI, MITTER ([id number]) [id number] Page 2 of 12 []  - 0 Lower Extremity Assessment (monofilament, tuning fork, pulses) []  - 0 Peripheral Arterial Disease Assessment (using hand held doppler) ASSESSMENTS - Ostomy and/or Continence Assessment and Care []  - 0 Incontinence Assessment and Management []  - 0 Ostomy Care Assessment and Management (repouching, etc.) PROCESS - Coordination of Care []  - 0 Simple Patient / Family Education for ongoing care X- 1 20 Complex (extensive) Patient / Family Education for ongoing care X- 1 10 Staff obtains Chiropractor, Records, T Results / Process Orders est X- 1 10 Staff telephones HHA, Nursing Homes / Clarify orders / etc []  - 0 Routine Transfer to another Facility (non-emergent condition) []  - 0 Routine Hospital Admission (non-emergent condition) []  - 0 New Admissions / Manufacturing Engineer / Ordering NPWT Apligraf, etc. , []  - 0 Emergency Hospital Admission (emergent condition) []  - 0 Simple Discharge Coordination []  - 0 Complex (extensive) Discharge Coordination PROCESS - Special Needs []  - 0 Pediatric / Minor Patient Management []  - 0 Isolation Patient  Management []  - 0 Hearing / Language / Visual special needs []  - 0 Assessment of Community assistance (transportation, D/C planning, etc.) []  - 0 Additional assistance / Altered mentation []  -  0 Support Surface(s) Assessment (bed, cushion, seat, etc.) INTERVENTIONS - Wound Cleansing / Measurement []  - 0 Simple Wound Cleansing - one wound X- 2 5 Complex Wound Cleansing - multiple wounds X- 1 5 Wound Imaging (photographs - any number of wounds) []  - 0 Wound Tracing (instead of photographs) []  - 0 Simple Wound Measurement - one wound X- 2 5 Complex Wound Measurement - multiple wounds INTERVENTIONS - Wound Dressings []  - 0 Small Wound Dressing one or multiple wounds []  - 0 Medium Wound Dressing one or multiple wounds X- 1 20 Large Wound Dressing one or multiple wounds X- 1 5 Application of Medications - topical []  - 0 Application of Medications - injection INTERVENTIONS - Miscellaneous []  - 0 External ear exam []  - 0 Specimen Collection (cultures, biopsies, blood, body fluids, etc.) []  - 0 Specimen(s) / Culture(s) sent or taken to Lab for analysis []  - 0 Patient Transfer (multiple staff / Nurse, Adult / Similar devices) []  - 0 Simple Staple / Suture removal (25 or less) []  - 0 Complex Staple / Suture removal (26 or more) []  - 0 Hypo / Hyperglycemic Management (close monitor of Blood Glucose) ELCIE, PELSTER ([id number]) [id number] Page 3 of 12 []  - 0 Ankle / Brachial Index (ABI) - do not check if billed separately X- 1 5 Vital Signs Has the patient been seen at the hospital within the last three years: Yes Total Score: 125 Level Of Care: New/Established - Level 4 Electronic Signature(s) Signed: 03/24/2023 5:06:40 PM By: Cammie Sailors RN, BSN Entered By: Cammie Sailors on 03/24/2023 14:11:41 -------------------------------------------------------------------------------- Encounter Discharge Information Details Patient Name: Date of  Service: HONOR SEABRON DELAND SAILOR. 03/24/2023 12:30 PM Medical Record Number: [id number] Patient Account Number: [id number] Date of Birth/Sex: Treating RN: Jul 26, 1990 (33 y.o. [name] Cammie Sailors Primary Care Akaash Vandewater: TERRY KIDD RBE PO LA NCO, ILIA NA Other Clinician: Referring Symphonie Schneiderman: Treating Axel Frisk/Extender: Rosan Raisin MA LLIPEDDI, V ISHNU Weeks in Treatment: 1 Encounter Discharge Information Items Discharge Condition: Stable Ambulatory Status: Ambulatory Discharge Destination: Home Transportation: Private Auto Accompanied By: self Schedule Follow-up Appointment: Yes Clinical Summary of Care: Patient Declined Electronic Signature(s) Signed: 03/24/2023 5:06:40 PM By: Cammie Sailors RN, BSN Entered By: Cammie Sailors on 03/24/2023 14:10:36 -------------------------------------------------------------------------------- Lower Extremity Assessment Details Patient Name: Date of Service: HONOR SEABRON DELAND GEANNIE 03/24/2023 12:30 PM Medical Record Number: [id number] Patient Account Number: [id number] Date of Birth/Sex: Treating RN: 01/07/1991 (33 y.o. [name] Cammie Sailors Primary Care Lakeshia Dohner: TERRY KIDD RBE PO LA NCO, ILIA NA Other Clinician: Referring Terasa Orsini: Treating Demetruis Depaul/Extender: Rosan Raisin MA LLIPEDDI, V ISHNU Weeks in Treatment: 1 Edema Assessment Assessed: [Left: No] [Right: No] Edema: [Left: Ye] [Right: s] Calf Left: Right: Point of Measurement: 31 cm From Medial Instep 32 cm Ankle Left: Right: Point of Measurement: 9.5 cm From Medial Instep 20 cm Vascular Assessment Left: [[id number] Page 4 of 12Right:] Pulses: Dorsalis Pedis Palpable: [phone].pdf Page 4 of 12Yes] Extremity colors, hair growth, and conditions: Extremity Color: [phone].pdf Page 4 of 12Normal] Hair Growth on Extremity: [phone].pdf Page 4 of 12No] Temperature of Extremity:  [phone].pdf Page 4 of 12Warm] Capillary Refill: [phone].pdf Page 4 of 12< 3 seconds] Dependent Rubor: [phone].pdf Page 4 of 12No No] Electronic Signature(s) Signed: 03/24/2023 5:06:40 PM By: Cammie Sailors RN, BSN Entered By: Cammie Sailors on 03/24/2023 12:57:03 -------------------------------------------------------------------------------- Multi Wound Chart Details Patient Name: Date of Service: HONOR SEABRON DELAND GEANNIE 03/24/2023 12:30 PM Medical Record Number: [id number] Patient Account Number: [id number] Date of Birth/Sex: Treating RN: [birth year] (32 y.o. F) Primary  Care Zinedine Ellner: DEL O RBE PO LA NCO, ILIA NA Other Clinician: Referring Kady Toothaker: Treating Carsen Leaf/Extender: Rosan Raisin MA LLIPEDDI, V ISHNU Weeks in Treatment: 1 Vital Signs Height(in): 60 Pulse(bpm): 82 Weight(lbs): 122 Blood Pressure(mmHg): 134/82 Body Mass Index(BMI): 23.8 Temperature(F): 99.2 Respiratory Rate(breaths/min): 18 [1:Photos:] Left, Lateral Lower Leg Left, Anterior Lower Leg Left, Medial Lower Leg Wound Location: Gradually Appeared Gradually Appeared Gradually Appeared Wounding Event: T be determined o T be determined o T be determined o Primary Etiology: Anemia Anemia Anemia Comorbid History: 02/18/2023 02/18/2023 02/18/2023 Date Acquired: 1 1 1  Weeks of Treatment: Open Open Open Wound Status: No No No Wound Recurrence: 0x0x0 0.2x0.2x0.1 0.2x0.2x0.1 Measurements L x W x D (cm) 0 0.031 0.031 A (cm) : rea 0 0.003 0.003 Volume (cm) : 100.00% 99.60% 98.70% % Reduction in A rea: 100.00% 99.60% 98.70% % Reduction in Volume: Full Thickness Without Exposed Partial Thickness Partial Thickness Classification: Support Structures None Present Small Small Exudate A mount: N/A N/A Serosanguineous Exudate Type: N/A N/A red, brown Exudate Color: Distinct, outline attached Distinct, outline attached  Distinct, outline attached Wound Margin: None Present (0%) Large (67-100%) Large (67-100%) Granulation Amount: N/A Pink Red Granulation Quality: None Present (0%) None Present (0%) Small (1-33%) Necrotic Amount: N/A N/A Eschar Necrotic Tissue: Fascia: No Fascia: No Fascia: No Exposed Structures: Fat Layer (Subcutaneous Tissue): No Fat Layer (Subcutaneous Tissue): No Fat Layer (Subcutaneous Tissue): No Tendon: No Tendon: No Tendon: No CECELIA, GRACIANO ([id number]) [id number] Page 5 of 12 Muscle: No Muscle: No Muscle: No Joint: No Joint: No Joint: No Bone: No Bone: No Bone: No Large (67-100%) None None Epithelialization: Excoriation: No Excoriation: No Excoriation: No Periwound Skin Texture: Induration: No Induration: No Induration: No Callus: No Callus: No Callus: No Crepitus: No Crepitus: No Crepitus: No Rash: No Rash: No Rash: No Scarring: No Scarring: No Scarring: No Maceration: No Maceration: No Maceration: No Periwound Skin Moisture: Dry/Scaly: No Dry/Scaly: No Dry/Scaly: No Atrophie Blanche: No Atrophie Blanche: No Atrophie Blanche: No Periwound Skin Color: Cyanosis: No Cyanosis: No Cyanosis: No Ecchymosis: No Ecchymosis: No Ecchymosis: No Erythema: No Erythema: No Erythema: No Hemosiderin Staining: No Hemosiderin Staining: No Hemosiderin Staining: No Mottled: No Mottled: No Mottled: No Pallor: No Pallor: No Pallor: No Rubor: No Rubor: No Rubor: No Treatment Notes Wound #2 (Lower Leg) Wound Laterality: Left, Anterior Cleanser Soap and Water Discharge Instruction: May shower and wash wound with dial antibacterial soap and water prior to dressing change. Peri-Wound Care Topical Triamcinolone Discharge Instruction: Apply Triamcinolone as directed Primary Dressing Xeroform Occlusive Gauze Dressing, 4x4 in Discharge Instruction: Apply to wound bed as instructed Secondary Dressing Secured  With Kerlix Roll Sterile, 4.5x3.1 (in/yd) Discharge Instruction: Secure with Kerlix as directed. Paper Tape, 1x10 (in/yd) Discharge Instruction: Secure dressing with tape as directed. Compression Wrap tubigrip size D one layer Discharge Instruction: wear daily. Compression Stockings Add-Ons Wound #3 (Lower Leg) Wound Laterality: Left, Medial Cleanser Soap and Water Discharge Instruction: May shower and wash wound with dial antibacterial soap and water prior to dressing change. Peri-Wound Care Topical Triamcinolone Discharge Instruction: Apply Triamcinolone as directed Primary Dressing Xeroform Occlusive Gauze Dressing, 4x4 in Discharge Instruction: Apply to wound bed as instructed Secondary Dressing Secured With Kerlix Roll Sterile, 4.5x3.1 (in/yd) Discharge Instruction: Secure with Kerlix as directed. Paper Tape, 1x10 (in/yd) Discharge Instruction: Secure dressing with tape as directed. Scholler, Taimane N ([id number]) [id number] Page 6 of 12 Compression Wrap tubigrip size D one layer Discharge Instruction: wear daily. Compression Stockings Add-Ons Electronic Signature(s) Signed: 03/24/2023 5:12:23  PM By: Rosan Raisin DO Entered By: Rosan Raisin on 03/24/2023 14:52:45 -------------------------------------------------------------------------------- Multi-Disciplinary Care Plan Details Patient Name: Date of Service: MA DELMA, DRONE 03/24/2023 12:30 PM Medical Record Number: [id number] Patient Account Number: [id number] Date of Birth/Sex: Treating RN: [birth year] (33 y.o. [name] Cammie Sailors Primary Care Harlyn Rathmann: TERRY KIDD RBE PO LA NCO, ILIA NA Other Clinician: Referring Raianna Slight: Treating Donyale Falcon/Extender: Rosan Raisin MA LLIPEDDI, V ISHNU Weeks in Treatment: 1 Active Inactive Orientation to the Wound Care Program Nursing Diagnoses: Knowledge deficit related to the wound healing center program Goals: Patient/caregiver will verbalize  understanding of the Wound Healing Center Program Date Initiated: 03/17/2023 Target Resolution Date: 04/18/2023 Goal Status: Active Interventions: Provide education on orientation to the wound center Notes: Pain, Acute or Chronic Nursing Diagnoses: Pain, acute or chronic: actual or potential Potential alteration in comfort, pain Goals: Patient will verbalize adequate pain control and receive pain control interventions during procedures as needed Date Initiated: 03/17/2023 Target Resolution Date: 04/18/2023 Goal Status: Active Patient/caregiver will verbalize comfort level met Date Initiated: 03/17/2023 Target Resolution Date: 04/18/2023 Goal Status: Active Interventions: Encourage patient to take pain medications as prescribed Provide education on pain management Treatment Activities: Administer pain control measures as ordered : 03/17/2023 Notes: Electronic Signature(s) Signed: 03/24/2023 5:06:40 PM By: Cammie Sailors RN, BSN Entered By: Cammie Sailors on 03/24/2023 13:20:42 [name] ([id number]) [id number] Page 7 of 12 -------------------------------------------------------------------------------- Pain Assessment Details Patient Name: Date of Service: KATRENIA, ALKINS 03/24/2023 12:30 PM Medical Record Number: [id number] Patient Account Number: [id number] Date of Birth/Sex: Treating RN: 20-Aug-1990 (33 y.o. [name] Cammie Sailors Primary Care Jaycie Kregel: TERRY KIDD RBE PO LA NCO, ILIA NA Other Clinician: Referring Brace Welte: Treating Timmy Bubeck/Extender: Rosan Raisin MA LLIPEDDI, V ISHNU Weeks in Treatment: 1 Active Problems Location of Pain Severity and Description of Pain Patient Has Paino Yes Site Locations Rate the pain. Current Pain Level: 7 Pain Management and Medication Current Pain Management: Electronic Signature(s) Signed: 03/24/2023 5:06:40 PM By: Cammie Sailors RN, BSN Entered By: Cammie Sailors on 03/24/2023  12:53:52 -------------------------------------------------------------------------------- Patient/Caregiver Education Details Patient Name: Date of Service: MA SEABRON DELAND BROCKS 1/13/2025andnbsp12:30 PM Medical Record Number: [id number] Patient Account Number: [id number] Date of Birth/Gender: Treating RN: [birth year] (33 y.o. [name] Cammie Sailors Primary Care Physician: TERRY KIDD RBE PO LA NCO, ILIA NA Other Clinician: Referring Physician: Treating Physician/Extender: Rosan Raisin MA LLIPEDDI, V ISHNU Weeks in Treatment: 1 Education Assessment Education Provided To: Patient Education Topics Provided Welcome T [facility]-New Patient Packet: o Methods: Explain/Verbal ELAISHA, ZAHNISER ([id number]) [id number] Page 8 of 12 Responses: State content correctly Wound/Skin Impairment: Methods: Explain/Verbal Responses: State content correctly Electronic Signature(s) Signed: 03/24/2023 5:06:40 PM By: Cammie Sailors RN, BSN Entered By: Cammie Sailors on 03/24/2023 13:21:03 -------------------------------------------------------------------------------- Wound Assessment Details Patient Name: Date of Service: HONOR SEABRON DELAND BROCKS 03/24/2023 12:30 PM Medical Record Number: [id number] Patient Account Number: [id number] Date of Birth/Sex: Treating RN: 08-03-1990 (33 y.o. [name] Cammie Sailors Primary Care Eathen Budreau: DEL O RBE PO LA NCO, ILIA NA Other Clinician: Referring Kip Cropp: Treating Chloe Baig/Extender: Rosan Raisin MA LLIPEDDI, V ISHNU Weeks in Treatment: 1 Wound Status Wound Number: 1 Primary Etiology: T be determined o Wound Location: Left, Lateral Lower Leg Wound Status: Open Wounding Event: Gradually Appeared Comorbid History: Anemia Date Acquired: 02/18/2023 Weeks Of Treatment: 1 Clustered Wound: No Photos Wound Measurements Length: (cm) Width: (cm) Depth: (cm) Area: (cm) Volume: (cm) 0 % Reduction in Area: 100% 0 % Reduction in Volume:  100% 0 Epithelialization: Large (67-100%) 0 Tunneling:  No 0 Undermining: No Wound Description Classification: Full Thickness Without Exposed Support Wound Margin: Distinct, outline attached Exudate Amount: None Present Structures Foul Odor After Cleansing: No Slough/Fibrino No Wound Bed Granulation Amount: None Present (0%) Exposed Structure Necrotic Amount: None Present (0%) Fascia Exposed: No Fat Layer (Subcutaneous Tissue) Exposed: No Tendon Exposed: No Muscle Exposed: No Joint Exposed: No Bone Exposed: No [address] SUMMAR, MCGLOTHLIN ([id number]) [id number] Page 9 of 12 No Abnormalities Noted: No No Abnormalities Noted: No Callus: No Atrophie Blanche: No Crepitus: No Cyanosis: No Excoriation: No Ecchymosis: No Induration: No Erythema: No Rash: No Hemosiderin Staining: No Scarring: No Mottled: No Pallor: No Moisture Rubor: No No Abnormalities Noted: No Dry / Scaly: No Maceration: No Electronic Signature(s) Signed: 03/24/2023 5:06:40 PM By: Cammie Sailors RN, BSN Entered By: Cammie Sailors on 03/24/2023 13:01:56 -------------------------------------------------------------------------------- Wound Assessment Details Patient Name: Date of Service: HONOR SEABRON DELAND GEANNIE 03/24/2023 12:30 PM Medical Record Number: [id number] Patient Account Number: [id number] Date of Birth/Sex: Treating RN: 1990-03-13 (33 y.o. [name] Cammie Sailors Primary Care Verl Kitson: DEL O RBE PO LA NCO, ILIA NA Other Clinician: Referring Abbagale Goguen: Treating Judine Arciniega/Extender: Rosan Raisin MA LLIPEDDI, V ISHNU Weeks in Treatment: 1 Wound Status Wound Number: 2 Primary Etiology: T be determined o Wound Location: Left, Anterior Lower Leg Wound Status: Open Wounding Event: Gradually Appeared Comorbid History: Anemia Date Acquired: 02/18/2023 Weeks Of Treatment: 1 Clustered Wound: No Photos Wound Measurements Length: (cm) 0.2 Width: (cm)  0.2 Depth: (cm) 0.1 Area: (cm) 0.031 Volume: (cm) 0.003 % Reduction in Area: 99.6% % Reduction in Volume: 99.6% Epithelialization: None Tunneling: No Undermining: No Wound Description Classification: Partial Thickness Wound Margin: Distinct, outline attached Exudate Amount: Small Foul Odor After Cleansing: No Slough/Fibrino No Wound Bed Granulation Amount: Large (67-100%) Exposed Structure Granulation Quality: Pink Fascia Exposed: No Necrotic Amount: None Present (0%) Fat Layer (Subcutaneous Tissue) Exposed: No Tendon Exposed: No Muscle Exposed: No GWENDY, BOEDER ([id number]) [id number] Page 10 of 12 Joint Exposed: No Bone Exposed: No Periwound Skin Texture Texture Color No Abnormalities Noted: No No Abnormalities Noted: No Callus: No Atrophie Blanche: No Crepitus: No Cyanosis: No Excoriation: No Ecchymosis: No Induration: No Erythema: No Rash: No Hemosiderin Staining: No Scarring: No Mottled: No Pallor: No Moisture Rubor: No No Abnormalities Noted: No Dry / Scaly: No Maceration: No Treatment Notes Wound #2 (Lower Leg) Wound Laterality: Left, Anterior Cleanser Soap and Water Discharge Instruction: May shower and wash wound with dial antibacterial soap and water prior to dressing change. Peri-Wound Care Topical Triamcinolone Discharge Instruction: Apply Triamcinolone as directed Primary Dressing Xeroform Occlusive Gauze Dressing, 4x4 in Discharge Instruction: Apply to wound bed as instructed Secondary Dressing Secured With Kerlix Roll Sterile, 4.5x3.1 (in/yd) Discharge Instruction: Secure with Kerlix as directed. Paper Tape, 1x10 (in/yd) Discharge Instruction: Secure dressing with tape as directed. Compression Wrap tubigrip size D one layer Discharge Instruction: wear daily. Compression Stockings Add-Ons Electronic Signature(s) Signed: 03/24/2023 5:06:40 PM By: Cammie Sailors RN, BSN Entered By: Cammie Sailors on  03/24/2023 13:51:34 -------------------------------------------------------------------------------- Wound Assessment Details Patient Name: Date of Service: HONOR SEABRON DELAND GEANNIE 03/24/2023 12:30 PM Medical Record Number: [id number] Patient Account Number: [id number] Date of Birth/Sex: Treating RN: 06-29-1990 (33 y.o. [name] Cammie Sailors Primary Care Quasim Doyon: DEL O RBE PO LA NCO, ILIA NA Other Clinician: Referring Danny Yackley: Treating Isauro Skelley/Extender: Rosan Raisin MA LLIPEDDI, V ISHNU Weeks in Treatment: 1 Wound Status Wound Number: 3 Primary Etiology: T be determined o Wound Location: Left, Medial Lower Leg Wound Status: Open Fauble,  Katrinna N ([id number]) [id number] Page 11 of 12 Wounding Event: Gradually Appeared Comorbid History: Anemia Date Acquired: 02/18/2023 Weeks Of Treatment: 1 Clustered Wound: No Photos Wound Measurements Length: (cm) 0.2 Width: (cm) 0.2 Depth: (cm) 0.1 Area: (cm) 0.031 Volume: (cm) 0.003 % Reduction in Area: 98.7% % Reduction in Volume: 98.7% Epithelialization: None Tunneling: No Undermining: No Wound Description Classification: Partial Thickness Wound Margin: Distinct, outline attached Exudate Amount: Small Exudate Type: Serosanguineous Exudate Color: red, brown Foul Odor After Cleansing: No Slough/Fibrino No Wound Bed Granulation Amount: Large (67-100%) Exposed Structure Granulation Quality: Red Fascia Exposed: No Necrotic Amount: Small (1-33%) Fat Layer (Subcutaneous Tissue) Exposed: No Necrotic Quality: Eschar Tendon Exposed: No Muscle Exposed: No Joint Exposed: No Bone Exposed: No Periwound Skin Texture Texture Color No Abnormalities Noted: No No Abnormalities Noted: No Callus: No Atrophie Blanche: No Crepitus: No Cyanosis: No Excoriation: No Ecchymosis: No Induration: No Erythema: No Rash: No Hemosiderin Staining: No Scarring: No Mottled: No Pallor: No Moisture Rubor: No No  Abnormalities Noted: No Dry / Scaly: No Maceration: No Treatment Notes Wound #3 (Lower Leg) Wound Laterality: Left, Medial Cleanser Soap and Water Discharge Instruction: May shower and wash wound with dial antibacterial soap and water prior to dressing change. Peri-Wound Care Topical Triamcinolone Discharge Instruction: Apply Triamcinolone as directed Primary Dressing Xeroform Occlusive Gauze Dressing, 4x4 in Discharge Instruction: Apply to wound bed as instructed NADIRA, SINGLE ([id number]) [phone].pdf Page 12 of 12 Secondary Dressing Secured With [employer], 4.5x3.1 (in/yd) Discharge Instruction: Secure with Kerlix as directed. Paper Tape, 1x10 (in/yd) Discharge Instruction: Secure dressing with tape as directed. Compression Wrap tubigrip size D one layer Discharge Instruction: wear daily. Compression Stockings Add-Ons Electronic Signature(s) Signed: 03/24/2023 5:06:40 PM By: Cammie Sailors RN, BSN Entered By: Cammie Sailors on 03/24/2023 13:51:34 -------------------------------------------------------------------------------- Vitals Details Patient Name: Date of Service: HONOR SEABRON DELAND GEANNIE 03/24/2023 12:30 PM Medical Record Number: [id number] Patient Account Number: [id number] Date of Birth/Sex: Treating RN: Sep 02, 1990 (33 y.o. [name] Cammie Sailors [facility] Kaytelyn Glore: DEL O RBE PO LA NCO, ILIA NA Other Clinician: Referring Rasa Degrazia: Treating Samarion Ehle/Extender: Rosan Raisin MA LLIPEDDI, V ISHNU Weeks in Treatment: 1 Vital Signs Time Taken: 12:50 Temperature (F): 99.2 Height (in): 60 Pulse (bpm): 82 Weight (lbs): 122 Respiratory Rate (breaths/min): 18 Body Mass Index (BMI): 23.8 Blood Pressure (mmHg): 134/82 Reference Range: 80 - 120 mg / dl Electronic Signature(s) Signed: 03/24/2023 5:06:40 PM By: Cammie Sailors RN, BSN Entered By: Cammie Sailors on 03/24/2023 12:53:43

## 2023-03-26 NOTE — Progress Notes (Signed)
 Mcfate, Moesha N ([id number]) [id number] Page 1 of 12 Visit Report for 03/17/2023 Allergy List Details Patient Name: Date of Service: Traci Hensley, Traci Hensley 03/17/2023 12:45 PM Medical Record Number: [id number] Patient Account Number: [id number] Date of Birth/Sex: Treating RN: June 02, 1990 (32 y.o. Traci Hensley Primary Care Derold Dorsch: Traci Hensley Other Clinician: Referring Traci Hensley: Treating Traci Hensley/Extender: Traci Hensley Traci Hensley, Traci ISHNU Weeks in Treatment: 0 Allergies Active Allergies adhesive blue dye green dye Reaction: do not combo with blue dye; can have separate. Iodinated Contrast Media Reaction: N/Traci nickel Omnipaque  onion sunlight Reaction: fatigue, migraine, cat dander Type: Allergen petroleum distillates Reaction: rash silicone Reaction: rash bee venom protein (honey bee) Reaction: hives Allergy Notes Electronic Signature(s) Signed: 03/19/2023 5:28:22 PM By: Drury [name] RN, BSN Entered By: Drury Hensley on 03/14/2023 12:43:40 -------------------------------------------------------------------------------- Arrival Information Details Patient Name: Date of Service: Traci Hensley 03/17/2023 12:45 PM Medical Record Number: [id number] Patient Account Number: [id number] Date of Birth/Sex: Treating RN: 08/28/1990 (32 y.o. Traci Hensley Primary Care Rodnisha Blomgren: Traci Hensley Other Clinician: Referring Marjean Imperato: Treating [name]: Traci Hensley Traci Hensley, Traci ISHNU Weeks in Treatment: 0 Visit Information Myler, Jennifermarie N ([id number]) [id number] Page 2 of 12 Patient Arrived: Ambulatory Arrival Time: 12:38 Accompanied By: [facility] POA/ Roommate Transfer Assistance: None Patient Identification Verified: Yes Secondary Verification Process Completed: Yes Patient Requires Transmission-Based Precautions: No Patient Has Alerts: No Electronic Signature(s) Signed: 03/19/2023  5:28:22 PM By: Drury [name] RN, BSN Entered By: Drury Hensley on 03/17/2023 12:43:18 -------------------------------------------------------------------------------- Clinic Level of Care Assessment Details Patient Name: Date of Service: Traci Hensley, Traci Hensley 03/17/2023 12:45 PM Medical Record Number: [id number] Patient Account Number: [id number] Date of Birth/Sex: Treating RN: 03/07/1991 (32 y.o. Traci Hensley Primary Care Wynell Halberg: Traci Hensley Other Clinician: Referring Damoni Erker: Treating Brileigh Sevcik/Extender: Traci Hensley Traci Hensley, Traci ISHNU Weeks in Treatment: 0 Clinic Level of Care Assessment Items TOOL 2 Quantity Score X- 1 0 Use when only an EandM is performed on the INITIAL visit ASSESSMENTS - Nursing Assessment / Reassessment X- 1 20 General Physical Exam (combine w/ comprehensive assessment (listed just below) when performed on new pt. evals) X- 1 25 Comprehensive Assessment (HX, ROS, Risk Assessments, Wounds Hx, etc.) ASSESSMENTS - Wound and Skin A ssessment / Reassessment []  - 0 Simple Wound Assessment / Reassessment - one wound X- 3 5 Complex Wound Assessment / Reassessment - multiple wounds X- 1 10 Dermatologic / Skin Assessment (not related to wound area) ASSESSMENTS - Ostomy and/or Continence Assessment and Care []  - 0 Incontinence Assessment and Management []  - 0 Ostomy Care Assessment and Management (repouching, etc.) PROCESS - Coordination of Care []  - 0 Simple Patient / Family Education for ongoing care X- 1 20 Complex (extensive) Patient / Family Education for ongoing care X- 1 10 Staff obtains Chiropractor, Records, T Results / Process Orders est []  - 0 Staff telephones HHA, Nursing Homes / Clarify orders / etc []  - 0 Routine Transfer to another Facility (non-emergent condition) []  - 0 Routine Hospital Admission (non-emergent condition) []  - 0 New Admissions / Manufacturing Engineer / Ordering NPWT Apligraf, etc. , []  -  0 Emergency Hospital Admission (emergent condition) []  - 0 Simple Discharge Coordination X- 1 15 Complex (extensive) Discharge Coordination PROCESS - Special Needs []  - 0 Pediatric / Minor Patient Management []  - 0 Isolation Patient Management PARTHENA, FERGESON ([id number]) [id number] Page 3 of 12 []  - 0 Hearing /  Language / Visual special needs []  - 0 Assessment of Community assistance (transportation, D/C planning, etc.) []  - 0 Additional assistance / Altered mentation []  - 0 Support Surface(s) Assessment (bed, cushion, seat, etc.) INTERVENTIONS - Wound Cleansing / Measurement X- 1 5 Wound Imaging (photographs - any number of wounds) []  - 0 Wound Tracing (instead of photographs) []  - 0 Simple Wound Measurement - one wound X- 3 5 Complex Wound Measurement - multiple wounds []  - 0 Simple Wound Cleansing - one wound X- 3 5 Complex Wound Cleansing - multiple wounds INTERVENTIONS - Wound Dressings []  - 0 Small Wound Dressing one or multiple wounds []  - 0 Medium Wound Dressing one or multiple wounds X- 1 20 Large Wound Dressing one or multiple wounds []  - 0 Application of Medications - injection INTERVENTIONS - Miscellaneous []  - 0 External ear exam X- 1 5 Specimen Collection (cultures, biopsies, blood, body fluids, etc.) []  - 0 Specimen(s) / Culture(s) sent or taken to Lab for analysis []  - 0 Patient Transfer (multiple staff / Nurse, Adult / Similar devices) []  - 0 Simple Staple / Suture removal (25 or less) []  - 0 Complex Staple / Suture removal (26 or more) []  - 0 Hypo / Hyperglycemic Management (close monitor of Blood Glucose) X- 1 15 Ankle / Brachial Index (ABI) - do not check if billed separately Has the patient been seen at the hospital within the last three years: Yes Total Score: 190 Level Of Care: New/Established - Level 5 Electronic Signature(s) Signed: 03/19/2023 5:28:22 PM By: Drury [name] RN, BSN Entered By: Drury Hensley  on 03/17/2023 13:38:06 -------------------------------------------------------------------------------- Encounter Discharge Information Details Patient Name: Date of Service: Traci Hensley 03/17/2023 12:45 PM Medical Record Number: [id number] Patient Account Number: [id number] Date of Birth/Sex: Treating RN: 04-Mar-1991 (32 y.o. Traci Hensley Primary Care Rashawn Rayman: Traci Hensley Other Clinician: Referring Elba Schaber: Treating Angie Piercey/Extender: Traci Hensley Traci Hensley, Traci ISHNU Weeks in Treatment: 0 Encounter Discharge Information Items Discharge Condition: Stable Ambulatory Status: Ambulatory Discharge Destination: Home Transportation: Private Auto Accompanied By: roommate Schedule Follow-up Appointment: Yes Clinical Summary of Care: Traci Hensley, Traci Hensley ([id number]) [id number] Page 4 of 12 Electronic Signature(s) Signed: 03/19/2023 5:28:22 PM By: Drury [name] RN, BSN Entered By: Drury Hensley on 03/17/2023 13:38:35 -------------------------------------------------------------------------------- Lower Extremity Assessment Details Patient Name: Date of Service: Traci Hensley, Traci Hensley 03/17/2023 12:45 PM Medical Record Number: [id number] Patient Account Number: [id number] Date of Birth/Sex: Treating RN: 1990-10-13 (32 y.o. Traci Drury, Geminus.gell Primary Care Wahid Holley: Traci Hensley Other Clinician: Referring Velvie Thomaston: Treating Rhia Blatchford/Extender: Traci Hensley Traci Hensley, Traci ISHNU Weeks in Treatment: 0 Edema Assessment Assessed: [Left: Yes] [Right: No] Edema: [Left: Ye] [Right: s] Calf Left: Right: Point of Measurement: 31 cm From Medial Instep 33 cm Ankle Left: Right: Point of Measurement: 9.5 cm From Medial Instep 21 cm Knee To Floor Left: Right: From Medial Instep 43 cm Vascular Assessment Pulses: Dorsalis Pedis Palpable: [Left:Yes] Doppler Audible: [Left:Yes] Posterior Tibial Palpable: [Left:Yes] Doppler Audible:  [Left:Yes] Extremity colors, hair growth, and conditions: Extremity Color: [Left:Normal] Hair Growth on Extremity: [Left:No] Temperature of Extremity: [Left:Warm] Capillary Refill: [Left:< 3 seconds] Dependent Rubor: [Left:No] Blanched when Elevated: [Left:No] Lipodermatosclerosis: [Left:No] Blood Pressure: Brachial: [Left:145] Ankle: [Left:Dorsalis Pedis: 160 1.10] Toe Nail Assessment Left: Right: Thick: No Discolored: No Deformed: No Improper Length and Hygiene: No Electronic Signature(s) Signed: 03/19/2023 5:28:22 PM By: Drury [name] RN, BSN Entered By: Drury Hensley on 03/17/2023 12:54:35 Traci Hensley ([id number])  [id number] Page 5 of 12 -------------------------------------------------------------------------------- Multi Wound Chart Details Patient Name: Date of Service: Traci Hensley, Traci Hensley 03/17/2023 12:45 PM Medical Record Number: [id number] Patient Account Number: [id number] Date of Birth/Sex: Treating RN: 06/09/1990 (32 y.o. F) Primary Care Aideen Fenster: Traci Hensley Other Clinician: Referring Dung Prien: Treating Javin Nong/Extender: Traci Hensley Traci Hensley, Traci ISHNU Weeks in Treatment: 0 Vital Signs Height(in): 60 Pulse(bpm): 118 Weight(lbs): 122 Blood Pressure(mmHg): 145/90 Body Mass Index(BMI): 23.8 Temperature(F): 98.3 Respiratory Rate(breaths/min): 18 [1:Photos:] Left, Lateral Lower Leg Left, Anterior Lower Leg Left, Medial Lower Leg Wound Location: Gradually Appeared Gradually Appeared Gradually Appeared Wounding Event: T be determined o T be determined o T be determined o Primary Etiology: Anemia Anemia Anemia Comorbid History: 02/18/2023 02/18/2023 02/18/2023 Date Acquired: 0 0 0 Weeks of Treatment: Open Open Open Wound Status: No No No Wound Recurrence: 1x0.3x0.1 3x3x0.1 3x1x0.1 Measurements L x W x D (cm) 0.236 7.069 2.356 A (cm) : rea 0.024 0.707 0.236 Volume (cm) : Full Thickness Without  Exposed Partial Thickness Partial Thickness Classification: Support Structures Small Small Small Exudate A mount: Serosanguineous N/A Serosanguineous Exudate Type: red, brown N/A red, brown Exudate Color: Distinct, outline attached Distinct, outline attached Distinct, outline attached Wound Margin: Large (67-100%) Large (67-100%) Medium (34-66%) Granulation Amount: Red, Pink, Pale Pink Red Granulation Quality: N/A None Present (0%) Medium (34-66%) Necrotic Amount: N/A N/A Eschar Necrotic Tissue: Fascia: No Fascia: No Fascia: No Exposed Structures: Fat Layer (Subcutaneous Tissue): No Fat Layer (Subcutaneous Tissue): No Fat Layer (Subcutaneous Tissue): No Tendon: No Tendon: No Tendon: No Muscle: No Muscle: No Muscle: No Joint: No Joint: No Joint: No Bone: No Bone: No Bone: No Large (67-100%) Large (67-100%) N/A Epithelialization: Excoriation: No Excoriation: No Excoriation: No Periwound Skin Texture: Induration: No Induration: No Induration: No Callus: No Callus: No Callus: No Crepitus: No Crepitus: No Crepitus: No Rash: No Rash: No Rash: No Scarring: No Scarring: No Scarring: No Maceration: No Maceration: No Maceration: No Periwound Skin Moisture: Dry/Scaly: No Dry/Scaly: No Dry/Scaly: No Atrophie Blanche: No Atrophie Blanche: No Atrophie Blanche: No Periwound Skin Color: Cyanosis: No Cyanosis: No Cyanosis: No Ecchymosis: No Ecchymosis: No Ecchymosis: No Erythema: No Erythema: No Erythema: No Hemosiderin Staining: No Hemosiderin Staining: No Hemosiderin Staining: No Mottled: No Mottled: No Mottled: No Pallor: No Pallor: No Pallor: No Rubor: No Rubor: No Rubor: No ARELY, TINNER ([id number]) [id number] Page 6 of 12 Treatment Notes Wound #1 (Lower Leg) Wound Laterality: Left, Lateral Cleanser Soap and Water Discharge Instruction: May shower and wash wound with dial antibacterial soap and water  prior to dressing change. Peri-Wound Care Topical Triamcinolone Discharge Instruction: Apply Triamcinolone as directed Primary Dressing Secondary Dressing Secured With Compression Wrap tubigrip size D one layer Discharge Instruction: wear daily. Compression Stockings Add-Ons Wound #2 (Lower Leg) Wound Laterality: Left, Anterior Cleanser Soap and Water Discharge Instruction: May shower and wash wound with dial antibacterial soap and water prior to dressing change. Peri-Wound Care Topical Triamcinolone Discharge Instruction: Apply Triamcinolone as directed Primary Dressing Secondary Dressing Secured With Compression Wrap tubigrip size D one layer Discharge Instruction: wear daily. Compression Stockings Add-Ons Wound #3 (Lower Leg) Wound Laterality: Left, Medial Cleanser Soap and Water Discharge Instruction: May shower and wash wound with dial antibacterial soap and water prior to dressing change. Peri-Wound Care Topical Triamcinolone Discharge Instruction: Apply Triamcinolone as directed Primary Dressing Secondary Dressing Secured With Compression Wrap tubigrip size D one layer Discharge Instruction: wear daily. Compression Stockings Add-Ons Gaughran, Blaine N ([id number]) [id number] Page  7 of 12 Electronic Signature(s) Signed: 03/19/2023 5:26:35 PM By: Traci Raisin DO Entered By: Traci Hensley on 03/17/2023 15:21:22 -------------------------------------------------------------------------------- Multi-Disciplinary Care Plan Details Patient Name: Date of Service: Traci Hensley, Traci Hensley 03/17/2023 12:45 PM Medical Record Number: [id number] Patient Account Number: [id number] Date of Birth/Sex: Treating RN: Dec 23, 1990 (32 y.o. Traci Hensley Primary Care Odeal Welden: Traci Hensley Other Clinician: Referring Odetta Forness: Treating Bell Cai/Extender: Traci Hensley Traci Hensley, Traci ISHNU Weeks in Treatment: 0 Active  Inactive Orientation to the Wound Care Program Nursing Diagnoses: Knowledge deficit related to the wound healing center program Goals: Patient/caregiver will verbalize understanding of the Wound Healing Center Program Date Initiated: 03/17/2023 Target Resolution Date: 04/18/2023 Goal Status: Active Interventions: Provide education on orientation to the wound center Notes: Pain, Acute or Chronic Nursing Diagnoses: Pain, acute or chronic: actual or potential Potential alteration in comfort, pain Goals: Patient will verbalize adequate pain control and receive pain control interventions during procedures as needed Date Initiated: 03/17/2023 Target Resolution Date: 04/18/2023 Goal Status: Active Patient/caregiver will verbalize comfort level met Date Initiated: 03/17/2023 Target Resolution Date: 04/18/2023 Goal Status: Active Interventions: Encourage patient to take pain medications as prescribed Provide education on pain management Treatment Activities: Administer pain control measures as ordered : 03/17/2023 Notes: Electronic Signature(s) Signed: 03/19/2023 5:28:22 PM By: Drury [name] RN, BSN Entered By: Drury Hensley on 03/17/2023 13:12:20 Traci Hensley ([id number]) [id number] Page 8 of 12 -------------------------------------------------------------------------------- Pain Assessment Details Patient Name: Date of Service: Traci Hensley, Traci Hensley 03/17/2023 12:45 PM Medical Record Number: [id number] Patient Account Number: [id number] Date of Birth/Sex: Treating RN: [birth year] (32 y.o. Traci Hensley Primary Care Dathan Attia: Traci Hensley Other Clinician: Referring Ziah Leandro: Treating Adem Costlow/Extender: Traci Hensley Traci Hensley, Traci ISHNU Weeks in Treatment: 0 Active Problems Location of Pain Severity and Description of Pain Patient Has Paino Yes Site Locations Pain Location: Pain in Ulcers Rate the pain. Current Pain Level: 2 Pain Management  and Medication Current Pain Management: Medication: No Cold Application: No Rest: No Massage: No Activity: No T.E.N.S.: No Heat Application: No Leg drop or elevation: No Is the Current Pain Management Adequate: Adequate How does your wound impact your activities of daily livingo Sleep: No Bathing: No Appetite: No Relationship With Others: No Bladder Continence: No Emotions: No Bowel Continence: No Work: No Toileting: No Drive: No Dressing: No Hobbies: No Psychologist, [employer]) Signed: 03/19/2023 5:28:22 PM By: Drury [name] RN, BSN Entered By: Drury Hensley on 03/17/2023 12:50:21 -------------------------------------------------------------------------------- Patient/Caregiver Education Details Patient Name: Date of Service: Traci Hensley 1/6/2025andnbsp12:45 PM Medical Record Number: [id number] Patient Account Number: [id number] Date of Birth/Gender: Treating RN: Aug 25, 1990 (32 y.o. Traci Drury, Geminus.gell Primary Care Physician: Traci Hensley Other Clinician: Referring Physician: Treating Physician/Extender: Traci Hensley Traci Hensley, Traci ISHNU Weeks in TreatmentTUNISIA, LANDGREBE ([id number]) [id number] Page 9 of 12 Education Assessment Education Provided To: Patient Education Topics Provided Welcome T [facility] Packet: o Handouts: Welcome T [facility] o Methods: Explain/Verbal Responses: Reinforcements needed Electronic Signature(s) Signed: 03/19/2023 5:28:22 PM By: Drury [name] RN, BSN Entered By: Drury Hensley on 03/17/2023 13:12:31 -------------------------------------------------------------------------------- Wound Assessment Details Patient Name: Date of Service: Traci SEABRON DELAND Hensley 03/17/2023 12:45 PM Medical Record Number: [id number] Patient Account Number: [id number] Date of Birth/Sex: Treating RN: 1990/07/13 (32 y.o. Traci Hensley Primary Care Odester Nilson: Traci O RBE PO LA  NCO, ILIA Hensley Other Clinician: Referring Baani Bober: Treating Cortnie Ringel/Extender:  Traci Hensley Traci Hensley, Traci ISHNU Weeks in Treatment: 0 Wound Status Wound Number: 1 Primary Etiology: T be determined o Wound Location: Left, Lateral Lower Leg Wound Status: Open Wounding Event: Gradually Appeared Comorbid History: Anemia Date Acquired: 02/18/2023 Weeks Of Treatment: 0 Clustered Wound: No Photos Wound Measurements Length: (cm) 1 Width: (cm) 0.3 Depth: (cm) 0.1 Area: (cm) 0.236 Volume: (cm) 0.024 % Reduction in Area: % Reduction in Volume: Epithelialization: Large (67-100%) Tunneling: No Undermining: No Wound Description Classification: Full Thickness Without Exposed Support Wound Margin: Distinct, outline attached Exudate Amount: Small Exudate Type: Serosanguineous Exudate Color: red, brown Structures Foul Odor After Cleansing: No Slough/Fibrino No Wound Bed Granulation Amount: Large (67-100%) Exposed TEEA, DUCEY ([id number]) [id number] Page 10 of 12 Granulation Quality: Red, Pink, Pale Fascia Exposed: No Fat Layer (Subcutaneous Tissue) Exposed: No Tendon Exposed: No Muscle Exposed: No Joint Exposed: No Bone Exposed: No Periwound Skin Texture Texture Color No Abnormalities Noted: No No Abnormalities Noted: No Callus: No Atrophie Blanche: No Crepitus: No Cyanosis: No Excoriation: No Ecchymosis: No Induration: No Erythema: No Rash: No Hemosiderin Staining: No Scarring: No Mottled: No Pallor: No Moisture Rubor: No No Abnormalities Noted: No Dry / Scaly: No Maceration: No Electronic Signature(s) Signed: 03/19/2023 5:28:22 PM By: Drury [name] RN, BSN Signed: 03/24/2023 5:23:50 PM By: Wyn Iha Entered By: Wyn Iha on 03/17/2023 13:00:32 -------------------------------------------------------------------------------- Wound Assessment Details Patient Name: Date of Service: Traci Hensley 03/17/2023  12:45 PM Medical Record Number: [id number] Patient Account Number: [id number] Date of Birth/Sex: Treating RN: [birth year] (32 y.o. Traci Drury, Geminus.gell Primary Care Itzamara Casas: Traci Hensley Other Clinician: Referring Rosalba Totty: Treating Kelci Petrella/Extender: Traci Hensley Traci Hensley, Traci ISHNU Weeks in Treatment: 0 Wound Status Wound Number: 2 Primary Etiology: T be determined o Wound Location: Left, Anterior Lower Leg Wound Status: Open Wounding Event: Gradually Appeared Comorbid History: Anemia Date Acquired: 02/18/2023 Weeks Of Treatment: 0 Clustered Wound: No Photos Wound Measurements Length: (cm) Width: (cm) Depth: (cm) Area: (cm) Volume: (cm) 3 % Reduction in Area: 3 % Reduction in Volume: 0.1 Epithelialization: Large (67-100%) 7.069 Tunneling: No 0.707 Undermining: No Wound Description AZARIAH, LATENDRESSE ([id number]) Classification: Partial Thickness Wound Margin: Distinct, outline attached Exudate Amount: Small [id number] Page 11 of 12 Foul Odor After Cleansing: No Slough/Fibrino No Wound Bed Granulation Amount: Large (67-100%) Exposed Structure Granulation Quality: Pink Fascia Exposed: No Necrotic Amount: None Present (0%) Fat Layer (Subcutaneous Tissue) Exposed: No Tendon Exposed: No Muscle Exposed: No Joint Exposed: No Bone Exposed: No Periwound Skin Texture Texture Color No Abnormalities Noted: No No Abnormalities Noted: No Callus: No Atrophie Blanche: No Crepitus: No Cyanosis: No Excoriation: No Ecchymosis: No Induration: No Erythema: No Rash: No Hemosiderin Staining: No Scarring: No Mottled: No Pallor: No Moisture Rubor: No No Abnormalities Noted: No Dry / Scaly: No Maceration: No Electronic Signature(s) Signed: 03/19/2023 5:28:22 PM By: Drury [name] RN, BSN Signed: 03/24/2023 5:23:50 PM By: Wyn Iha Entered By: Wyn Iha on 03/17/2023  12:59:50 -------------------------------------------------------------------------------- Wound Assessment Details Patient Name: Date of Service: Traci Hensley 03/17/2023 12:45 PM Medical Record Number: [id number] Patient Account Number: [id number] Date of Birth/Sex: Treating RN: [birth year] (32 y.o. Traci Hensley Primary Care Kahleah Crass: Traci Hensley Other Clinician: Referring Amara Manalang: Treating Taneia Mealor/Extender: Traci Hensley Traci Hensley, Traci ISHNU Weeks in Treatment: 0 Wound Status Wound Number: 3 Primary Etiology: T be determined o Wound Location: Left, Medial Lower Leg Wound Status: Open Wounding Event: Gradually Appeared Comorbid History:  Anemia Date Acquired: 02/18/2023 Weeks Of Treatment: 0 Clustered Wound: No Photos Wound Measurements Length: (cm) 3 EMERITA, BERKEMEIER ([id number]) Width: (cm) 1 Depth: (cm) 0.1 Area: (cm) 2.356 Volume: (cm) 0.236 % Reduction in Area: [id number] Page 12 of 12 % Reduction in Volume: Tunneling: No Undermining: No Wound Description Classification: Partial Thickness Wound Margin: Distinct, outline attached Exudate Amount: Small Exudate Type: Serosanguineous Exudate Color: red, brown Foul Odor After Cleansing: No Slough/Fibrino No Wound Bed Granulation Amount: Medium (34-66%) Exposed Structure Granulation Quality: Red Fascia Exposed: No Necrotic Amount: Medium (34-66%) Fat Layer (Subcutaneous Tissue) Exposed: No Necrotic Quality: Eschar Tendon Exposed: No Muscle Exposed: No Joint Exposed: No Bone Exposed: No Periwound Skin Texture Texture Color No Abnormalities Noted: No No Abnormalities Noted: No Callus: No Atrophie Blanche: No Crepitus: No Cyanosis: No Excoriation: No Ecchymosis: No Induration: No Erythema: No Rash: No Hemosiderin Staining: No Scarring: No Mottled: No Pallor: No Moisture Rubor: No No Abnormalities Noted: No Dry / Scaly: No Maceration:  No Electronic Signature(s) Signed: 03/19/2023 5:28:22 PM By: Drury [name] RN, BSN Signed: 03/24/2023 5:23:50 PM By: Wyn Iha Entered By: Wyn Iha on 03/17/2023 13:00:12 -------------------------------------------------------------------------------- Vitals Details Patient Name: Date of Service: Traci Hensley 03/17/2023 12:45 PM Medical Record Number: [id number] Patient Account Number: [id number] Date of Birth/Sex: Treating RN: 1990/05/07 (32 y.o. Traci Drury, Geminus.gell Primary Care Litha Lamartina: Traci Hensley Other Clinician: Referring Trygg Mantz: Treating Cisco Kindt/Extender: Traci Hensley Traci Hensley, Traci ISHNU Weeks in Treatment: 0 Vital Signs Time Taken: 12:43 Temperature (F): 98.3 Height (in): 60 Pulse (bpm): 118 Source: Stated Respiratory Rate (breaths/min): 18 Weight (lbs): 122 Blood Pressure (mmHg): 145/90 Source: Stated Reference Range: 80 - 120 mg / dl Body Mass Index (BMI): 23.8 Electronic Signature(s) Signed: 03/19/2023 5:28:22 PM By: Drury [name] RN, BSN Entered By: Drury Hensley on 03/17/2023 12:44:25

## 2023-03-26 NOTE — Progress Notes (Signed)
 [name], [name] ([id number]) [id number] Page 1 of 8 Visit Report for 03/24/2023 Chief Complaint Document Details Patient Name: Date of Service: Traci Hensley, Traci Hensley 03/24/2023 12:30 PM Medical Record Number: [id number] Patient Account Number: [id number] Date of Birth/Sex: Treating RN: 10-07-1990 (32 y.o. F) Primary Care Provider: DEL O RBE PO LA NCO, ILIA NA Other Clinician: Referring Provider: Treating Provider/Extender: Rosan Raisin Traci LLIPEDDI, V ISHNU Weeks in Treatment: 1 Information Obtained from: Patient Chief Complaint 03/17/2023; Rash Electronic Signature(s) Signed: 03/24/2023 5:12:23 PM By: Rosan Raisin DO Entered By: Rosan Raisin on 03/24/2023 14:52:55 -------------------------------------------------------------------------------- HPI Details Patient Name: Date of Service: Traci Hensley 03/24/2023 12:30 PM Medical Record Number: [id number] Patient Account Number: [id number] Date of Birth/Sex: Treating RN: Aug 20, 1990 (32 y.o. F) Primary Care Provider: DEL O RBE PO LA NCO, ILIA NA Other Clinician: Referring Provider: Treating Provider/Extender: Rosan Raisin Traci LLIPEDDI, V ISHNU Weeks in Treatment: 1 History of Present Illness HPI Description: 03/17/2023 [name] is a 33 year old female with a past medical history of vertebral artery dissection Traci autonomic dysfunction that presents to the clinic for a 7-week history of rash to her left lower extremity. She visited the ED on 02/18/23 for this issue Traci was started on Keflex  for potential cellulitis. She finished this course. She also saw podiatry on 12/17 that started her on doxycycline  Traci recommended a compounded ointment mixture of hydrocortisone Traci nystatin. Unfortunately her insurance did not cover the compounded ointment Traci she did not try this. She did complete both Keflex  Traci doxycycline  with some benefit. She recently saw Dr. Overton (Infectious disease) on 03/13/2023  Traci recommended stopping antibiotics. He obtained C-reactive protein, sed rate, CBC with differential Traci CMP. He recommended a skin biopsy Traci culture. Currently patient states that the rash is limited to the left leg but she occasionally has little spots appear on her chest Traci arm. The rash on her left leg waxes Traci wanes. She denies increased warmth, erythema or purulent drainage. She denies pruritus but reports a burning sensation. She also reports muscle weakness Traci pain to her lower extremities bilaterally. She does not have a dermatologist or rheumatologist. She states she is on 5 mg of prednisone  for muscle/joint pain. 03/24/2023; patient presents for follow-up. She states she is not triamcinolone cream with benefit but stopped. It is unclear why. She now has new areas limited to skin breakdown but the previous ones noted last week of healed. PCR culture grew normal skin flora. We referred her to rheumatology however when their next available is not for another 4 months. She currently denies signs of infection. Electronic Signature(s) Signed: 03/24/2023 5:12:23 PM By: Rosan Raisin DO Entered By: Rosan Raisin on 03/24/2023 14:58:12 [name] ([id number]) [id number] Page 2 of 8 -------------------------------------------------------------------------------- Physical Exam Details Patient Name: Date of Service: Traci Hensley, Traci Hensley 03/24/2023 12:30 PM Medical Record Number: [id number] Patient Account Number: [id number] Date of Birth/Sex: Treating RN: [birth year] (32 y.o. F) Primary Care Provider: DEL O RBE PO LA NCO, ILIA NA Other Clinician: Referring Provider: Treating Provider/Extender: Rosan Raisin Traci LLIPEDDI, V ISHNU Weeks in Treatment: 1 Constitutional respirations regular, non-labored Traci within target range for patient.. Cardiovascular 2+ dorsalis pedis/posterior tibialis pulses. Psychiatric pleasant Traci cooperative. Notes Multiple  areas of skin breakdown to the left lower extremity. All areas of breakdown are superficial. Most distal area with weeping noted. No signs of surrounding infection including increased warmth, erythema or purulent drainage. Electronic Signature(s) Signed: 03/24/2023 5:12:23 PM By: Rosan Raisin DO Entered By: Rosan,  Sharea Guinther on 03/24/2023 14:59:37 -------------------------------------------------------------------------------- Physician Orders Details Patient Name: Date of Service: [name], [name] 03/24/2023 12:30 PM Medical Record Number: [id number] Patient Account Number: [id number] Date of Birth/Sex: Treating RN: Jul 04, 1990 (32 y.o. [name] Cammie Sailors Primary Care Provider: TERRY KIDD RBE PO LA NCO, ILIA NA Other Clinician: Referring Provider: Treating Provider/Extender: Rosan Raisin Traci LLIPEDDI, V ISHNU Weeks in Treatment: 1 Verbal / Phone Orders: No Diagnosis Coding ICD-10 Coding Code Description L98.499 Non-pressure chronic ulcer of skin of other sites with unspecified severity L30.8 Other specified dermatitis Follow-up Appointments ppointment in 1 week. - Dr. Rosan (front office to schedule) Return A Other: - Rheumatologist will call you to schedule an appt. Will call you when the culture results return. Pick up topical steroid cream from your pharmacy. Anesthetic (In clinic) Topical Lidocaine  4% applied to wound bed Bathing/ Shower/ Hygiene May shower Traci wash wound with soap Traci water. Home Health Wound #2 Left,Anterior Lower Leg New wound care orders this week; continue Home Health for wound care. May utilize formulary equivalent dressing for wound treatment orders unless otherwise specified. - mix Bactroban  with TCA cream to cover leg, cover open spots with xeroform, secure with Kerlix, apply tubigrip for compression Other Home Health Orders/Instructions: - [facility] Wound #3 Left,Medial Lower Leg New wound care orders this week; continue Home Health for wound  care. May utilize formulary equivalent dressing for wound treatment orders unless otherwise specified. - mix Bactroban  with TCA cream to cover leg, cover open spots with xeroform, secure with Kerlix, apply tubigrip for compression Other Home Health Orders/Instructions: - [facility] [place], Keyona Hensley ([id number]) [id number] Page 3 of 8 Wound Treatment Wound #2 - Lower Leg Wound Laterality: Left, Anterior Cleanser: Soap Traci Water 1 x Per Day/30 Days Discharge Instructions: May shower Traci wash wound with dial antibacterial soap Traci water prior to dressing change. Topical: Triamcinolone 1 x Per Day/30 Days Discharge Instructions: Apply Triamcinolone as directed Prim Dressing: Xeroform Occlusive Gauze Dressing, 4x4 in 1 x Per Day/30 Days ary Discharge Instructions: Apply to wound bed as instructed Secured With: Kerlix Roll Sterile, 4.5x3.1 (in/yd) 1 x Per Day/30 Days Discharge Instructions: Secure with Kerlix as directed. Secured With: Paper Tape, 1x10 (in/yd) 1 x Per Day/30 Days Discharge Instructions: Secure dressing with tape as directed. Compression Wrap: tubigrip size D one layer 1 x Per Day/30 Days Discharge Instructions: wear daily. Wound #3 - Lower Leg Wound Laterality: Left, Medial Cleanser: Soap Traci Water 1 x Per Day/30 Days Discharge Instructions: May shower Traci wash wound with dial antibacterial soap Traci water prior to dressing change. Topical: Triamcinolone 1 x Per Day/30 Days Discharge Instructions: Apply Triamcinolone as directed Prim Dressing: Xeroform Occlusive Gauze Dressing, 4x4 in 1 x Per Day/30 Days ary Discharge Instructions: Apply to wound bed as instructed Secured With: Kerlix Roll Sterile, 4.5x3.1 (in/yd) 1 x Per Day/30 Days Discharge Instructions: Secure with Kerlix as directed. Secured With: Paper Tape, 1x10 (in/yd) 1 x Per Day/30 Days Discharge Instructions: Secure dressing with tape as directed. Compression Wrap: tubigrip size D one  layer 1 x Per Day/30 Days Discharge Instructions: wear daily. Patient Medications llergies: adhesive, blue dye, green dye, Iodinated Contrast Media, nickel, Omnipaque , onion, sunlight, petroleum distillates, silicone, bee venom protein A (honey bee) Notifications Medication Indication Start End 03/25/2023 lidocaine  DOSE topical 4 % cream - cream topical once daily Electronic Signature(s) Signed: 03/24/2023 5:12:23 PM By: Rosan Raisin DO Entered By: Rosan Raisin on 03/24/2023 14:59:48 -------------------------------------------------------------------------------- Problem List Details Patient Name: Date of Service:  Traci Hensley, Traci Hensley 03/24/2023 12:30 PM Medical Record Number: [id number] Patient Account Number: [id number] Date of Birth/Sex: Treating RN: 09/22/1990 (32 y.o. [name] Cammie Sailors Primary Care Provider: TERRY KIDD RBE PO LA NCO, ILIA NA Other Clinician: Referring Provider: Treating Provider/Extender: Rosan Raisin Traci LLIPEDDI, V ISHNU Weeks in Treatment: 11 Traci Hensley, Traci Hensley ([id number]) [id number] Page 4 of 8 ICD-10 Encounter Code Description Active Date MDM Diagnosis L98.499 Non-pressure chronic ulcer of skin of other sites with unspecified severity 03/17/2023 No Yes L30.8 Other specified dermatitis 03/17/2023 No Yes Inactive Problems Resolved Problems Electronic Signature(s) Signed: 03/24/2023 5:12:23 PM By: Rosan Raisin DO Entered By: Rosan Raisin on 03/24/2023 14:52:39 -------------------------------------------------------------------------------- Progress Note Details Patient Name: Date of Service: Traci Hensley 03/24/2023 12:30 PM Medical Record Number: [id number] Patient Account Number: [id number] Date of Birth/Sex: Treating RN: January 11, 1991 (32 y.o. F) Primary Care Provider: DEL O RBE PO LA NCO, ILIA NA Other Clinician: Referring Provider: Treating Provider/Extender: Rosan Raisin Traci LLIPEDDI, V  ISHNU Weeks in Treatment: 1 Subjective Chief Complaint Information obtained from Patient 03/17/2023; Rash History of Present Illness (HPI) 03/17/2023 Ms. Kayron Kalmar is a 33 year old female with a past medical history of vertebral artery dissection Traci autonomic dysfunction that presents to the clinic for a 7-week history of rash to her left lower extremity. She visited the ED on 02/18/23 for this issue Traci was started on Keflex  for potential cellulitis. She finished this course. She also saw podiatry on 12/17 that started her on doxycycline  Traci recommended a compounded ointment mixture of hydrocortisone Traci nystatin. Unfortunately her insurance did not cover the compounded ointment Traci she did not try this. She did complete both Keflex  Traci doxycycline  with some benefit. She recently saw Dr. Overton (Infectious disease) on 03/13/2023 Traci recommended stopping antibiotics. He obtained C-reactive protein, sed rate, CBC with differential Traci CMP. He recommended a skin biopsy Traci culture. Currently patient states that the rash is limited to the left leg but she occasionally has little spots appear on her chest Traci arm. The rash on her left leg waxes Traci wanes. She denies increased warmth, erythema or purulent drainage. She denies pruritus but reports a burning sensation. She also reports muscle weakness Traci pain to her lower extremities bilaterally. She does not have a dermatologist or rheumatologist. She states she is on 5 mg of prednisone  for muscle/joint pain. 03/24/2023; patient presents for follow-up. She states she is not triamcinolone cream with benefit but stopped. It is unclear why. She now has new areas limited to skin breakdown but the previous ones noted last week of healed. PCR culture grew normal skin flora. We referred her to rheumatology however when their next available is not for another 4 months. She currently denies signs of infection. Patient History Family History Heart Disease -  Mother. Social History Current some day smoker, Marital Status - Single, Alcohol Use - Never, Drug Use - No History, Caffeine Use - Never. Medical History Hematologic/Lymphatic Patient has history of Anemia - B12 deficient; Hospitalization/Surgery History - 11/03/22 vertebral artery dissection. - mouth surgery. - CVA 8/24. Medical A Surgical History Notes nd Constitutional Symptoms (General Health) Agoraphobia [place] 8/24 Hematologic/Lymphatic vitamin D  deficient Cardiovascular vertebral artery dissection 8/24 Musculoskeletal TIASHA, HELVIE ([id number]) [id number].pdf Page 5 of 8 scoliosis cervical spondylosis Psychiatric PTSD Objective Constitutional respirations regular, non-labored Traci within target range for patient.. Vitals Time Taken: 12:50 PM, Height: 60 in, Weight: 122 lbs, BMI: 23.8, Temperature: 99.2 F, Pulse: 82 bpm, Respiratory Rate: 18 breaths/min, Blood Pressure:  134/82 mmHg. Cardiovascular 2+ dorsalis pedis/posterior tibialis pulses. Psychiatric pleasant Traci cooperative. General Notes: Multiple areas of skin breakdown to the left lower extremity. All areas of breakdown are superficial. Most distal area with weeping noted. No signs of surrounding infection including increased warmth, erythema or purulent drainage. Integumentary (Hair, Skin) Wound #1 status is Open. Original cause of wound was Gradually Appeared. The date acquired was: 02/18/2023. The wound has been in treatment 1 weeks. The wound is located on the Left,Lateral Lower Leg. The wound measures 0cm length x 0cm width x 0cm depth; 0cm^2 area Traci 0cm^3 volume. There is no tunneling or undermining noted. There is a none present amount of drainage noted. The wound margin is distinct with the outline attached to the wound base. There is no granulation within the wound bed. There is no necrotic tissue within the wound bed. The periwound skin appearance did not exhibit: Callus,  Crepitus, Excoriation, Induration, Rash, Scarring, Dry/Scaly, Maceration, Atrophie Blanche, Cyanosis, Ecchymosis, Hemosiderin Staining, Mottled, Pallor, Rubor, Erythema. Wound #2 status is Open. Original cause of wound was Gradually Appeared. The date acquired was: 02/18/2023. The wound has been in treatment 1 weeks. The wound is located on the Left,Anterior Lower Leg. The wound measures 0.2cm length x 0.2cm width x 0.1cm depth; 0.031cm^2 area Traci 0.003cm^3 volume. There is no tunneling or undermining noted. There is a small amount of drainage noted. The wound margin is distinct with the outline attached to the wound base. There is large (67-100%) pink granulation within the wound bed. There is no necrotic tissue within the wound bed. The periwound skin appearance did not exhibit: Callus, Crepitus, Excoriation, Induration, Rash, Scarring, Dry/Scaly, Maceration, Atrophie Blanche, Cyanosis, Ecchymosis, Hemosiderin Staining, Mottled, Pallor, Rubor, Erythema. Wound #3 status is Open. Original cause of wound was Gradually Appeared. The date acquired was: 02/18/2023. The wound has been in treatment 1 weeks. The wound is located on the Left,Medial Lower Leg. The wound measures 0.2cm length x 0.2cm width x 0.1cm depth; 0.031cm^2 area Traci 0.003cm^3 volume. There is no tunneling or undermining noted. There is a small amount of serosanguineous drainage noted. The wound margin is distinct with the outline attached to the wound base. There is large (67-100%) red granulation within the wound bed. There is a small (1-33%) amount of necrotic tissue within the wound bed including Eschar. The periwound skin appearance did not exhibit: Callus, Crepitus, Excoriation, Induration, Rash, Scarring, Dry/Scaly, Maceration, Atrophie Blanche, Cyanosis, Ecchymosis, Hemosiderin Staining, Mottled, Pallor, Rubor, Erythema. Assessment Active Problems ICD-10 Non-pressure chronic ulcer of skin of other sites with unspecified  severity Other specified dermatitis Previous wound sites are healed with now new areas of skin breakdown. I recommended she use the triamcinolone cream daily for the next week. I also recommended Tubigrip. PCR culture with no evidence of viral, fungal or bacterial infection. She may benefit from a compression wrap as she does have some swelling on exam. We will discuss doing an in office wrap next clinic visit. She hesitates with anyone touching her legs. Unfortunately I do not have much else to offer. She is understanding of this. Plan Follow-up Appointments: Return Appointment in 1 week. - Dr. Rosan (front office to schedule) Other: - Rheumatologist will call you to schedule an appt. Will call you when the culture results return. Pick up topical steroid cream from your pharmacy. Anesthetic: (In clinic) Topical Lidocaine  4% applied to wound bed Bathing/ Shower/ Hygiene: May shower Traci wash wound with soap Traci water. Home Health: Wound #2 Left,Anterior Lower Leg: [place],  Yaritzel Hensley ([id number]) [id number] Page 6 of 8 New wound care orders this week; continue Home Health for wound care. May utilize formulary equivalent dressing for wound treatment orders unless otherwise specified. - mix Bactroban  with TCA cream to cover leg, cover open spots with xeroform, secure with Kerlix, apply tubigrip for compression Other Home Health Orders/Instructions: - [facility] Wound #3 Left,Medial Lower Leg: New wound care orders this week; continue Home Health for wound care. May utilize formulary equivalent dressing for wound treatment orders unless otherwise specified. - mix Bactroban  with TCA cream to cover leg, cover open spots with xeroform, secure with Kerlix, apply tubigrip for compression Other Home Health Orders/Instructions: - [facility] The following medication(s) was prescribed: lidocaine  topical 4 % cream cream topical once daily was prescribed at facility WOUND #2:  - Lower Leg Wound Laterality: Left, Anterior Cleanser: Soap Traci Water 1 x Per Day/30 Days Discharge Instructions: May shower Traci wash wound with dial antibacterial soap Traci water prior to dressing change. Topical: Triamcinolone 1 x Per Day/30 Days Discharge Instructions: Apply Triamcinolone as directed Prim Dressing: Xeroform Occlusive Gauze Dressing, 4x4 in 1 x Per Day/30 Days ary Discharge Instructions: Apply to wound bed as instructed Secured With: Kerlix Roll Sterile, 4.5x3.1 (in/yd) 1 x Per Day/30 Days Discharge Instructions: Secure with Kerlix as directed. Secured With: Paper T ape, 1x10 (in/yd) 1 x Per Day/30 Days Discharge Instructions: Secure dressing with tape as directed. Com pression Wrap: tubigrip size D one layer 1 x Per Day/30 Days Discharge Instructions: wear daily. WOUND #3: - Lower Leg Wound Laterality: Left, Medial Cleanser: Soap Traci Water 1 x Per Day/30 Days Discharge Instructions: May shower Traci wash wound with dial antibacterial soap Traci water prior to dressing change. Topical: Triamcinolone 1 x Per Day/30 Days Discharge Instructions: Apply Triamcinolone as directed Prim Dressing: Xeroform Occlusive Gauze Dressing, 4x4 in 1 x Per Day/30 Days ary Discharge Instructions: Apply to wound bed as instructed Secured With: Kerlix Roll Sterile, 4.5x3.1 (in/yd) 1 x Per Day/30 Days Discharge Instructions: Secure with Kerlix as directed. Secured With: Paper T ape, 1x10 (in/yd) 1 x Per Day/30 Days Discharge Instructions: Secure dressing with tape as directed. Com pression Wrap: tubigrip size D one layer 1 x Per Day/30 Days Discharge Instructions: wear daily. 1. Triamcinolone cream 2. Can mix mupirocin  ointment with this 3. Tubigrip 4. Follow-up in 1 week Electronic Signature(s) Signed: 03/24/2023 5:12:23 PM By: Rosan Raisin DO Entered By: Rosan Raisin on 03/24/2023 15:01:52 -------------------------------------------------------------------------------- HxROS  Details Patient Name: Date of Service: Traci SEABRON DELAND Hensley 03/24/2023 12:30 PM Medical Record Number: [id number] Patient Account Number: [id number] Date of Birth/Sex: Treating RN: May 08, 1990 (32 y.o. F) Primary Care Provider: DEL O RBE PO LA NCO, ILIA NA Other Clinician: Referring Provider: Treating Provider/Extender: Rosan Raisin Traci LLIPEDDI, V ISHNU Weeks in Treatment: 1 Constitutional Symptoms (General Health) Medical History: Past Medical History Notes: Ag[place]aphobia [place] 8[place]24 Hematologic/Lymphatic Medical History: Positive for: Anemia - B12 deficient; Past Medical History Notes: vitamin D  deficient Cardiovascular Medical History: Past Medical History NotesBETHA Cedar, Dennise Hensley ([id number]) [id number] Page 7 of 8 vertebral artery dissection 8/24 Musculoskeletal Medical History: Past Medical History Notes: scoliosis cervical spondylosis Psychiatric Medical History: Past Medical History Notes: PTSD Immunizations Implantable Devices No devices added Hospitalization / Surgery History Type of Hospitalization/Surgery 11/03/22 vertebral artery dissection mouth surgery CVA 8/24 Family Traci Social History Heart Disease: Yes - Mother; Current some day smoker; Marital Status - Single; Alcohol Use: Never; Drug Use: No History; Caffeine Use: Never Social Determinants of  Health (SDOH) 1. In the past 2 months, did you or others you live with eat smaller meals or skip meals because you didn't have money for foodo : No 2. Are you homeless or worried that you might be in the futureo : No 3. Do you have trouble paying for your utilities (gas, electricity, phone)o : No 4. Do you have trouble finding or paying for a rideo : No 5. Do you need daycare, or better daycare, for your kidso : No 6. Are you unemployed or without regular incomeo : No 7. Do you need help finding a better jobo : No 8. Do you need help getting more educationo : No 9. Are you concerned  about someone in your home using drugs or alcoholo : No 10. Do you feel unsafe in your daily lifeo : No 11. Is anyone in your home threatening or abusing youo : No 12. Do you lack quality relationships that make you feel valued Traci supportedo : No 13. Do you need help getting cultural information in a language you understando : No 14. Do you need help getting internet accesso : No Advanced Directives Traci Instructions Spiritual or Cultural beliefs preclude asking about Advance Care Planning: No Advanced Directives: Yes Copy Provided: No Do not resuscitate: No Living Will: No Medical Power of Attorney: Yes; room mate- cone has a copy. Surrogate Decision Maker: No Electronic Signature(s) Signed: 03/24/2023 5:12:23 PM By: Rosan Raisin DO Entered By: Rosan Raisin on 03/24/2023 14:58:20 -------------------------------------------------------------------------------- SuperBill Details Patient Name: Date of Service: Traci SEABRON DELAND Hensley 03/24/2023 Medical Record Number: [id number] Patient Account Number: [id number] Date of Birth/Sex: Treating RN: November 19, 1990 (32 y.o. [name] Cammie Sailors Primary Care Provider: TERRY KIDD RBE PO LA NCO, ILIA NA Other Clinician: Referring Provider: Treating Provider/Extender: Rosan Raisin Traci LLIPEDDI, V ISHNU Weeks in Treatment: 1 Surber, Tyshawna Hensley ([id number]) [id number] Page 8 of 8 Diagnosis Coding ICD-10 Codes Code Description L98.499 Non-pressure chronic ulcer of skin of other sites with unspecified severity L30.8 Other specified dermatitis Facility Procedures : CPT4 Code: 23899860 Description: 99214 - WOUND CARE VISIT-LEV 4 EST PT Modifier: Quantity: 1 Physician Procedures : CPT4 Code Description Modifier [id number] - WC PHYS LEVEL 3 - EST PT ICD-10 Diagnosis Description L98.499 Non-pressure chronic ulcer of skin of other sites with unspecified severity L30.8 Other specified dermatitis Quantity: 1 Electronic  Signature(s) Signed: 03/24/2023 5:12:23 PM By: Rosan Raisin DO Entered By: Rosan Raisin on 03/24/2023 15:02:03

## 2023-03-27 ENCOUNTER — Other Ambulatory Visit: Payer: Self-pay

## 2023-03-27 ENCOUNTER — Encounter: Payer: Self-pay | Admitting: Internal Medicine

## 2023-03-27 ENCOUNTER — Ambulatory Visit (INDEPENDENT_AMBULATORY_CARE_PROVIDER_SITE_OTHER): Payer: Medicare Other | Admitting: Internal Medicine

## 2023-03-27 VITALS — BP 157/100 | HR 115 | Temp 98.0°F | Ht 60.0 in | Wt 122.0 lb

## 2023-03-27 DIAGNOSIS — R21 Rash and other nonspecific skin eruption: Secondary | ICD-10-CM | POA: Diagnosis present

## 2023-03-27 NOTE — Patient Instructions (Signed)
Will need some time for the pathology to be done and culture to return with sensitivity (if anything is positive)  See me in around 5 weeks   Please do not take any antibiotics -- if this is the kind of infection I worry about it will make it very difficult to treat later or continue diagnosis   In the mean time I'll refer you to dermatology as well, in case they think it's not infection (if culture is positive you can cancel dermatology visit)

## 2023-03-27 NOTE — Addendum Note (Signed)
Addended by: Harley Alto on: 03/27/2023 04:58 PM   Modules accepted: Orders

## 2023-03-27 NOTE — Progress Notes (Signed)
[facility]  Reason for Consult:rash LLE Referring Provider: Luane School    Patient Active Problem List   Diagnosis Date Noted   Right leg weakness 11/06/2022   Spotting 11/06/2022   Vertebral artery dissection (HCC) 11/03/2022   Anxiety and depression 10/29/2022   Vitamin B12 deficiency 10/15/2022   Bradycardia 08/27/2022   Autonomic dysfunction 08/27/2022   History of enlargement of pituitary gland 12/25/2017   Moderate malnutrition (HCC) 12/25/2017   Loss of weight 12/25/2017   Vitamin D deficiency 12/25/2017   Cervical spondylosis 11/27/2017   Fatigue 11/27/2017   Family history of lupus erythematosus 11/27/2017   Thoracic spondylosis without myelopathy 11/27/2017   Ingrown toenail 11/23/2015   Dyspepsia 08/31/2014   Abdominal pain 06/29/2014   Achilles tendinitis of both lower extremities 01/23/2014   Palpitations 04/19/2013   Chest pain 04/19/2013   Sinus tachycardia 04/19/2013   Anxiety 04/19/2013      HPI: Traci Hensley is a 32 y.o. female hx vertebral artery dissection and a chronic left leg wound referred here for same   03/27/23 id clinic f/u Patient appeared to have f/u'ed with wound care twice the last couple weeks A biopsy was not done as wound care was worried it could make her skin condition worse, but an wound swab was sent for bacteria pcr Labs all normal in terms of blood count, albumin/lft, sed rate/crp; bcx also was negative She is developing new lesions and also old worsening lesion today's visit No fever, chill    --------------- Initial visit note with me I reviewed epic charts 02/18/23 -- ed visit at [facility] for 3 week left leg wound -- patient doesn't recall how she got the wound. Had vertebral artery dissection a few months prior (10/2022); repeat brain mra 01/2023 showed healing right vertebral artery dissection and continued on aspirin with neurology f/u. Exam finding at that time  described multiple abrasions noted to LLE and full thickness wound to lateral aspect that is open and very tender to touch given keflex and referred to wound clinic   12/17 and 03/03/23 podiatry visit -- reports topical abx ointment use as well. Not yet seen wound clinic. Telfa dressing/gauze wrap use. Reports several allergy to tape and petrolatum. Given hydrocortisone-zinc and doxycycline.   03/13/2023 id clinic initial visit Patient reports she was planting some mulberry bush at the time this happens. [place] farm with chickens/ducks/cats. There are dogs/cows on nearby properties Patient has fever off and on; last fever a few weeks as high as 107, but many readings are 101. She showed me several pictures. There is almost like a road burn and a clear blister on the distal lateral aspect of her leg. She has a tatoo there that has been there a long time. Then the rash spread circunferentially and upward over the next several weeks with some ulcerates/opens up. Yellowish thin fluid mostly for exudates She reports developing an allergy to doxy so had continued cephalexin --> finished today 20 days She used topical silvadene and bactroban at first when the initial wound open, but then the other lesion would occur at a completely new area.   She is also on prednisone chronically 5 mg daily for her joint pain given by pcp. She had done dose packs at higher dose at most twice a year    Review of Systems: ROS All other ros negative      Past Medical History:  Diagnosis Date  Agoraphobia    Anxiety    B12 deficiency anemia    Bipolar 1 disorder (HCC)    Chronic abdominal pain    Chronic chest pain    Depression    Dystonia    Unknown which type   PTSD (post-traumatic stress disorder)    Scoliosis    Vertebral artery dissection (HCC) 11/03/2022    Social History   Tobacco Use   Smoking status: Some Days    Current packs/day: 0.00    Types: Cigarettes    Start date:  02/15/2012    Last attempt to quit: 02/14/2014    Years since quitting: 9.1   Smokeless tobacco: Never   Tobacco comments:    rare, social smoker  Vaping Use   Vaping status: Former  Substance Use Topics   Alcohol use: Not Currently   Drug use: No    Family History  Problem Relation Age of Onset   Arrhythmia Mother    Colon cancer Neg Hx     Allergies  Allergen Reactions   Adhesive [Tape] Other (See Comments)    Eats skin almost instantly   Blue Dyes (Parenteral) Other (See Comments)    BLUE DYES IS OK BY ITSELF, BUT CANNOT BE COMBINED WITH GREEN DYES   Contrast Media [Iodinated Contrast Media] Nausea And Vomiting    Liquid version not the IV. Causes Vomiting up blood, shaking and tremors uncontrollable, dizziness and lightheadness   Green Dyes Other (See Comments)    GREEN DYE IS OK BY ITSELF, BUT CANNOT BE COMBINED WITH BLUE DYE   Nickel Itching, Swelling, Rash and Other (See Comments)   Omnipaque [Iohexol]     CT tech notes state "Patient allergy to IV dye"   Onion     Cannot be around onion at all or she has breathing difficulty   Other Nausea Only, Rash and Other (See Comments)    Sunlight causes extreme fatigue , Migraine , Cat Dander  Any type of anesthetic causes her to have prolonged sedation, requiring more than normal doses to reverse them   Petroleum Distillate Itching, Swelling and Rash   Silicone Rash    rash   Bee Venom Hives    Severe disorientation   Yellow Jacket Venom Hives    Severe disorientation    OBJECTIVE: Vitals:   03/27/23 0847  Temp: 98 F (36.7 C)  TempSrc: Temporal  Weight: 122 lb (55.3 kg)  Height: 5' (1.524 m)   Body mass index is 23.83 kg/m.   Physical Exam General/constitutional: no distress, pleasant HEENT: Normocephalic, PER, Conj Clear, EOMI, Oropharynx clear Neck supple CV: rrr no mrg Lungs: clear to auscultation, normal respiratory effort Abd: Soft, Nontender Ext: no edema Skin: several superficial rash as below;  tender to touch of calf Neuro: nonfocal MSK: no peripheral joint swelling/tenderness/warmth; back spines nontender  03/13/2023          03/27/23 pictures Old scab becoming more nodular and there is new lesions that is linear           Lab: Lab Results  Component Value Date   WBC 10.1 03/13/2023   HGB 13.8 03/13/2023   HCT 41.5 03/13/2023   MCV 91.2 03/13/2023   PLT 290 03/13/2023   Last metabolic panel Lab Results  Component Value Date   GLUCOSE 88 03/13/2023   NA 139 03/13/2023   K 3.8 03/13/2023   CL 102 03/13/2023   CO2 26 03/13/2023   BUN 13 03/13/2023   CREATININE 0.61 03/13/2023  GFRNONAA >60 01/18/2023   CALCIUM 9.8 03/13/2023   PROT 8.0 03/13/2023   ALBUMIN 4.7 01/18/2023   BILITOT 0.3 03/13/2023   ALKPHOS 51 01/18/2023   AST 13 03/13/2023   ALT 9 03/13/2023   ANIONGAP 12 01/18/2023    Microbiology:  Serology:  Imaging:   Assessment/plan: Problem List Items Addressed This Visit   None Visit Diagnoses       Rash    -  Primary   Relevant Orders   Ambulatory referral to Dermatology   MYCOBACTERIA, CULTURE, WITH FLUOROCHROME SMEAR   Fungus Culture & Smear   Anaerobic and Aerobic Culture         Chronic pred 5 mg not high dose so not considered "immunosuppressed" Use of topical abx could cause contact dermatitis but lesions per her report occur in new area not related to topical medication use With exposure and clinical history suggest ntm/nocardia/nocardia The rash is rather superficial  Would stop all systemic and topical antibiotics or any other topical stuffs at this time  She will see wound care next week and we can ask for skin biopsy and AFB/fungal culture at the edge of the wounds (2 places ideally -- one with the most recent occurring wound and one with the most severe appearing wound), and if possible uw 16s for bacteria/afb/fungal The sample for culture/rna testing would need to be in sterile saline  F/u with me in  3-4 weeks. Again no abx   Chronic pred/joint pain recommends ongoing rheumatology management   Basic labs including blood cx today given fever. Advise her to record vitals for me  Forwarding chart to Geralyn Corwin of wound care regarding the testing/biopsy  ---------- 03/27/23 id assessment Worsening rash Crp/esr low but by itself really doesn't r/o some smoldering infections  This is NOT regular typical cellulitis and I advised her again do not take any antibiotics in the case that this would muddy the water or creat resistant to those organisms that require multiple concomittant antibiotics  Will refer her to dermatology for pathology (we can not do pathology here) in case today's punch biopsy for afb/fungal/bacterial culture  F/u around 5 weeks (if I see positive culture will see if I can get her back sooner)  --------------- Clinic procedure Punch biopsy left leg upper outer and lower inner lesions (1 former 2 latter); latter sent for micro; upper lesion sent for path    Procedure Done:  Punch biopsy  Date/Time: 3:49 PM 03/27/2023   Performed by: Rexene Alberts, Rutha Bouchard  Consent:    Consent obtained:  Verbal   Pre-procedure details:    Skin preparation:  Betadine then alcohol for prep, allowed to air dry. Anesthetic: (see MAR for exact dosages):    Anesthesia method:  Local infiltration   Local anesthetic:  Lidocaine 1%   Procedure details:    Incision types:  punch biopsy   Incision depth:  subcutaneous   Scalpel blade / Punch Diameter: 4 mm punch    Wound management: compression dressing   Drainage: serosanguinous   Drainage amount:  minimal   Wound treatment: pressure held until hemostasis obtained. Surgical wound cleansed with ointment and dry dressing applied.    Packing materials:  None  Post-procedure details:    Patient tolerance of procedure: Tolerated well, no immediate complications   Follow up Wound Care Instructions: change bandage in 24  hours. Call if blood soaking through and hold pressure. Continue to keep covered with dry gauze and tape dressing. Will take 1-2  weeks to heal completely. Keep clean with gentle soap and water, pat dry.   Additional Details / Notes:  2 lower lesions sent to micro 1 upper lesion sent to path   Total Time Spent: 45 minutes   Follow-up: Return in about 5 weeks (around 05/01/2023).  Raymondo Band, MD [facility] 03/27/2023, 8:51 AM

## 2023-03-31 ENCOUNTER — Encounter (HOSPITAL_BASED_OUTPATIENT_CLINIC_OR_DEPARTMENT_OTHER): Payer: Medicare Other | Admitting: Internal Medicine

## 2023-03-31 LAB — PATHOLOGY REPORT

## 2023-04-02 ENCOUNTER — Encounter: Payer: Self-pay | Admitting: Family Medicine

## 2023-04-02 ENCOUNTER — Ambulatory Visit: Payer: Medicare Other | Admitting: Family Medicine

## 2023-04-02 VITALS — BP 120/85 | Temp 102.0°F | Ht 60.0 in | Wt 122.0 lb

## 2023-04-02 DIAGNOSIS — G894 Chronic pain syndrome: Secondary | ICD-10-CM | POA: Diagnosis not present

## 2023-04-02 DIAGNOSIS — R471 Dysarthria and anarthria: Secondary | ICD-10-CM | POA: Diagnosis not present

## 2023-04-02 DIAGNOSIS — R21 Rash and other nonspecific skin eruption: Secondary | ICD-10-CM

## 2023-04-02 DIAGNOSIS — R29898 Other symptoms and signs involving the musculoskeletal system: Secondary | ICD-10-CM

## 2023-04-02 DIAGNOSIS — G909 Disorder of the autonomic nervous system, unspecified: Secondary | ICD-10-CM

## 2023-04-02 DIAGNOSIS — I7774 Dissection of vertebral artery: Secondary | ICD-10-CM

## 2023-04-02 DIAGNOSIS — F419 Anxiety disorder, unspecified: Secondary | ICD-10-CM

## 2023-04-02 LAB — ANAEROBIC AND AEROBIC CULTURE
MICRO NUMBER:: 15966095
MICRO NUMBER:: 15966096
SPECIMEN QUALITY:: ADEQUATE
SPECIMEN QUALITY:: ADEQUATE

## 2023-04-02 MED ORDER — PREDNISONE 5 MG PO TABS: 5.0000 mg | ORAL_TABLET | Freq: Every day | ORAL | 4 refills | Status: DC
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
## 2023-04-03 ENCOUNTER — Encounter: Payer: Self-pay | Admitting: Family Medicine

## 2023-04-08 ENCOUNTER — Encounter: Payer: Self-pay | Admitting: Internal Medicine

## 2023-04-14 ENCOUNTER — Telehealth: Payer: Self-pay | Admitting: Internal Medicine

## 2023-04-14 DIAGNOSIS — R471 Dysarthria and anarthria: Secondary | ICD-10-CM

## 2023-04-14 DIAGNOSIS — R29898 Other symptoms and signs involving the musculoskeletal system: Secondary | ICD-10-CM

## 2023-04-14 DIAGNOSIS — G909 Disorder of the autonomic nervous system, unspecified: Secondary | ICD-10-CM

## 2023-04-14 NOTE — Telephone Encounter (Signed)
Please send new referral for OT home services to [facility] to [phone]. [facility] does not do this service.

## 2023-04-16 NOTE — Addendum Note (Signed)
 Addended by: Jacqualyn Mates on: 04/16/2023 08:46 AM   Modules accepted: Orders

## 2023-04-16 NOTE — Telephone Encounter (Signed)
 [facility] does offer OT but if Patient is requesting OT through [facility] then a Home Health Referral needs to be placed for OT disciplines - the way it is placed now if for Outpatient Services.

## 2023-04-17 ENCOUNTER — Emergency Department (HOSPITAL_COMMUNITY): Payer: Medicare Other

## 2023-04-17 ENCOUNTER — Ambulatory Visit: Payer: Self-pay | Admitting: Internal Medicine

## 2023-04-17 ENCOUNTER — Ambulatory Visit: Payer: Medicare Other | Admitting: Internal Medicine

## 2023-04-17 ENCOUNTER — Other Ambulatory Visit: Payer: Self-pay

## 2023-04-17 ENCOUNTER — Emergency Department (HOSPITAL_COMMUNITY)
Admission: EM | Admit: 2023-04-17 | Discharge: 2023-04-17 | Disposition: A | Discharge status: Home / Self Care | Payer: Medicare Other | Attending: Emergency Medicine | Admitting: Emergency Medicine

## 2023-04-17 DIAGNOSIS — L03116 Cellulitis of left lower limb: Secondary | ICD-10-CM | POA: Diagnosis not present

## 2023-04-17 DIAGNOSIS — Z7902 Long term (current) use of antithrombotics/antiplatelets: Secondary | ICD-10-CM | POA: Diagnosis not present

## 2023-04-17 DIAGNOSIS — R2240 Localized swelling, mass and lump, unspecified lower limb: Secondary | ICD-10-CM | POA: Diagnosis present

## 2023-04-17 DIAGNOSIS — Z86718 Personal history of other venous thrombosis and embolism: Secondary | ICD-10-CM | POA: Insufficient documentation

## 2023-04-17 DIAGNOSIS — Z79899 Other long term (current) drug therapy: Secondary | ICD-10-CM | POA: Diagnosis not present

## 2023-04-17 LAB — CBC WITH DIFFERENTIAL/PLATELET
Abs Immature Granulocytes: 0.01 10*3/uL (ref 0.00–0.07)
Basophils Absolute: 0 10*3/uL (ref 0.0–0.1)
Basophils Relative: 0 %
Eosinophils Absolute: 0.1 10*3/uL (ref 0.0–0.5)
Eosinophils Relative: 1 %
HCT: 38.3 % (ref 36.0–46.0)
Hemoglobin: 12.7 g/dL (ref 12.0–15.0)
Immature Granulocytes: 0 %
Lymphocytes Relative: 30 %
Lymphs Abs: 2.2 10*3/uL (ref 0.7–4.0)
MCH: 30.8 pg (ref 26.0–34.0)
MCHC: 33.2 g/dL (ref 30.0–36.0)
MCV: 93 fL (ref 80.0–100.0)
Monocytes Absolute: 0.5 10*3/uL (ref 0.1–1.0)
Monocytes Relative: 7 %
Neutro Abs: 4.6 10*3/uL (ref 1.7–7.7)
Neutrophils Relative %: 62 %
Platelets: 295 10*3/uL (ref 150–400)
RBC: 4.12 MIL/uL (ref 3.87–5.11)
RDW: 11.9 % (ref 11.5–15.5)
WBC: 7.4 10*3/uL (ref 4.0–10.5)
nRBC: 0 % (ref 0.0–0.2)

## 2023-04-17 LAB — COMPREHENSIVE METABOLIC PANEL
ALT: 11 U/L (ref 0–44)
AST: 16 U/L (ref 15–41)
Albumin: 4.3 g/dL (ref 3.5–5.0)
Alkaline Phosphatase: 36 U/L — ABNORMAL LOW (ref 38–126)
Anion gap: 11 (ref 5–15)
BUN: 8 mg/dL (ref 6–20)
CO2: 22 mmol/L (ref 22–32)
Calcium: 9.1 mg/dL (ref 8.9–10.3)
Chloride: 104 mmol/L (ref 98–111)
Creatinine, Ser: 0.55 mg/dL (ref 0.44–1.00)
GFR, Estimated: >60 mL/min (ref >60)
Glucose, Bld: 101 mg/dL — ABNORMAL HIGH (ref 70–99)
Potassium: 3.9 mmol/L (ref 3.5–5.1)
Sodium: 137 mmol/L (ref 135–145)
Total Bilirubin: 0.5 mg/dL (ref 0.0–1.2)
Total Protein: 7.3 g/dL (ref 6.5–8.1)

## 2023-04-17 LAB — LACTIC ACID, PLASMA
Lactic Acid, Venous: 1.5 mmol/L (ref 0.5–1.9)
Lactic Acid, Venous: 1 mmol/L (ref 0.5–1.9)

## 2023-04-17 LAB — PROTIME-INR
INR: 1 (ref 0.8–1.2)
Prothrombin Time: 13.3 s (ref 11.4–15.2)

## 2023-04-17 MED ORDER — OXYCODONE HCL 5 MG PO TABS
5.0000 mg | ORAL_TABLET | Freq: Once | ORAL | Status: AC
  Administered 2023-04-17: 5 mg via ORAL
  Filled 2023-04-17: qty 1

## 2023-04-17 MED ORDER — CEPHALEXIN 500 MG PO CAPS
500.0000 mg | ORAL_CAPSULE | Freq: Once | ORAL | Status: AC
  Administered 2023-04-17: 500 mg via ORAL
  Filled 2023-04-17: qty 1

## 2023-04-17 MED ORDER — ONDANSETRON HCL 4 MG/2ML IJ SOLN
4.0000 mg | Freq: Once | INTRAMUSCULAR | Status: AC
  Administered 2023-04-17: 4 mg via INTRAVENOUS
  Filled 2023-04-17: qty 2

## 2023-04-17 MED ORDER — KETOROLAC TROMETHAMINE 15 MG/ML IJ SOLN
15.0000 mg | Freq: Once | INTRAMUSCULAR | Status: AC
  Administered 2023-04-17: 15 mg via INTRAMUSCULAR
  Filled 2023-04-17: qty 1

## 2023-04-17 MED ORDER — ONDANSETRON 4 MG PO TBDP: 4.0000 mg | ORAL_TABLET | Freq: Once | ORAL | Status: DC

## 2023-04-17 MED ORDER — OXYCODONE HCL 5 MG PO TABS: 5.0000 mg | ORAL_TABLET | Freq: Four times a day (QID) | ORAL | 0 refills | Status: DC | PRN

## 2023-04-17 MED ORDER — MORPHINE SULFATE (PF) 4 MG/ML IV SOLN: 4.0000 mg | Freq: Once | INTRAVENOUS | Status: DC

## 2023-04-17 MED ORDER — MORPHINE SULFATE (PF) 4 MG/ML IV SOLN
4.0000 mg | Freq: Once | INTRAVENOUS | Status: AC
  Administered 2023-04-17: 4 mg via INTRAVENOUS
  Filled 2023-04-17: qty 1

## 2023-04-17 MED ORDER — CEPHALEXIN 500 MG PO CAPS: 500.0000 mg | ORAL_CAPSULE | Freq: Four times a day (QID) | ORAL | 0 refills | Status: DC

## 2023-04-17 NOTE — ED Provider Notes (Signed)
 [facility] AT [facility] Provider Note   CSN: [id number] Arrival date & time: 04/17/23  1419     History  Chief Complaint  Patient presents with   Leg Swelling    Traci Hensley is a 33 y.o. female.  She reports PMH of lupus, vertebral artery dissection, and autonomic dysfunction, reports history of DVT in the past as well.  Complains of left leg redness swelling warmth and has been ongoing since November 2024.  She was on a total of 20 days of cephalexin .  They tried doxycycline  but she is allergic to it.  She states it had gotten mostly better but they stopped it and were pending a biopsy, patient states she stopped it January 2 and has gotten gradually worse since then.  She has been having intermittent fevers up to 10 1-1 02 Fahrenheit, denies fever today but she did already take Tylenol .  She is on Plavix  for her vertebral artery dissection.  She reports she was getting blisters noted open up and drain.  She states initially she had was from an abrasion on the shovel while working outside in November, she now keeps getting new wounds.  She previously been on mupirocin  ointment and have been told to stop this, thought to be possibly a topical irritant.  HPI     Home Medications Prior to Admission medications   Medication Sig Start Date End Date Taking? Authorizing Provider  acetaminophen  (TYLENOL ) 500 MG tablet Take 2,000-2,500 mg by mouth every 6 (six) hours as needed for mild pain (pain score 1-3).   Yes [provider]  albuterol  (PROVENTIL  HFA;VENTOLIN  HFA) 108 (90 Base) MCG/ACT inhaler Inhale 2 puffs into the lungs every 6 (six) hours as needed for wheezing or shortness of breath.   Yes [provider]  baclofen  (LIORESAL ) 20 MG tablet Take 20 mg by mouth as needed for muscle spasms. 10/14/20  Yes [provider]  BREO ELLIPTA  100-25 MCG/ACT AEPB Inhale 1 puff into the lungs daily.   Yes [provider]   cephALEXin  (KEFLEX ) 500 MG capsule Take 1 capsule (500 mg total) by mouth 4 (four) times daily for 7 days. 04/17/23 04/24/23 Yes Laval Cafaro A, PA-C  clopidogrel  (PLAVIX ) 75 MG tablet Take 75 mg by mouth daily.   Yes [provider]  cyanocobalamin  (VITAMIN B12) 1000 MCG/ML injection INJECT 1 ML INTRAMUSCULARLY EVERY OTHER WEEK 10/17/22  Yes Del Orbe Polanco, [place], FNP  cyclobenzaprine  (FLEXERIL ) 10 MG tablet Take 10 mg by mouth as needed for muscle spasms. 10/14/20  Yes [provider]  EPINEPHrine  0.3 mg/0.3 mL IJ SOAJ injection Inject 0.3 mg into the muscle once. As needed for allergic reaction   Yes [provider]  ergocalciferol  (VITAMIN D2) 1.25 MG (50000 UT) capsule Take 50,000 Units by mouth once a week.   Yes [provider]  gabapentin  (NEURONTIN ) 400 MG capsule Take 400 mg by mouth 3 (three) times daily.   Yes [provider]  ibuprofen  (ADVIL ) 200 MG tablet Take 1,600 mg by mouth every 6 (six) hours as needed for mild pain (pain score 1-3).   Yes [provider]  montelukast (SINGULAIR) 10 MG tablet Take 10 mg by mouth daily. 04/11/22  Yes [provider]  mupirocin  ointment (BACTROBAN ) 2 % Apply to skin rash on leg/ankle once daily. 03/03/23  Yes McCaughan, Dia D, DPM  nitroGLYCERIN  (NITROSTAT ) 0.4 MG SL tablet Place 1 tablet (0.4 mg total) under the tongue every 5 (five)  minutes as needed for chest pain. 06/17/14  Yes Jeffrie Oneil BROCKS, MD  NONFORMULARY OR COMPOUNDED ITEM Apply 1 application  topically 4 (four) times daily. Katamine/Gabapentin /Baclofen /Lidocaine /Menthol  Cream   Yes [provider]  norethindrone-ethinyl estradiol (NORTREL  0.5/35, 28,) 0.5-35 MG-MCG tablet Take 1 tablet by mouth daily.   Yes [provider]  olmesartan  (BENICAR ) 20 MG tablet Take 20 mg by mouth daily.   Yes [provider]  ondansetron  (ZOFRAN -ODT) 8 MG disintegrating tablet Take 8 mg by mouth as needed for nausea or  vomiting. 02/12/21  Yes [provider]  oxyCODONE  (ROXICODONE ) 5 MG immediate release tablet Take 1 tablet (5 mg total) by mouth every 6 (six) hours as needed for severe pain (pain score 7-10). 04/17/23  Yes Shalayne Leach A, PA-C  predniSONE  (DELTASONE ) 5 MG tablet Take 1 tablet (5 mg total) by mouth daily. 04/02/23  Yes Milian, Marry Lenis, FNP  SSD 1 % cream Apply topically as needed. 03/23/21  Yes Chandrasekhar, Mahesh A, MD  triamcinolone cream (KENALOG) 0.1 % Apply 1 Application topically daily. 03/17/23  Yes [provider]  metoprolol succinate (TOPROL-XL) 25 MG 24 hr tablet Take 25 mg by mouth daily. 03/26/23   [provider]      Allergies    Adhesive [tape], Blue dyes (parenteral), Contrast media [iodinated contrast media], Corn-containing products, Green dyes, Nickel, Omnipaque  [iohexol ], Onion, Other, Petroleum distillate, Silicone, Bee venom, Cortisporin-tc [neomycin-colist-hc-thonzonium], and Yellow jacket venom    Review of Systems   Review of Systems  Physical Exam Updated Vital Signs BP 115/73 (BP Location: Right Arm)   Pulse 79   Temp 99 F (37.2 C) (Oral)   Resp 20   Ht 5' (1.524 m)   Wt 54.4 kg   SpO2 99%   BMI 23.44 kg/m  Physical Exam Vitals and nursing note reviewed.  Constitutional:      General: She is not in acute distress.    Appearance: She is well-developed.  HENT:     Head: Normocephalic and atraumatic.  Eyes:     Conjunctiva/sclera: Conjunctivae normal.  Cardiovascular:     Rate and Rhythm: Normal rate and regular rhythm.     Heart sounds: No murmur heard. Pulmonary:     Effort: Pulmonary effort is normal. No respiratory distress.     Breath sounds: Normal breath sounds.  Abdominal:     Palpations: Abdomen is soft.     Tenderness: There is no abdominal tenderness.  Musculoskeletal:        General: No swelling.     Cervical back: Neck supple.  Skin:    General: Skin is warm and dry.     Capillary Refill:  Capillary refill takes less than 2 seconds.     Comments: Mild swelling noted to the left lower leg with diffuse tenderness, DP and PT pulses are intact.  Capillary refill is brisk.  Multiple areas of linear erythema with few areas that had previously opened and drained and are now slightly crusted over.  Neurological:     General: No focal deficit present.     Mental Status: She is alert and oriented to person, place, and time.  Psychiatric:        Mood and Affect: Mood normal.        ED Results / Procedures / Treatments   Labs (all labs ordered are listed, but only abnormal results are displayed) Labs Reviewed  COMPREHENSIVE METABOLIC PANEL - Abnormal; Notable for the following components:  Result Value   Glucose, Bld 101 (*)    Alkaline Phosphatase 36 (*)    All other components within normal limits  CULTURE, BLOOD (ROUTINE X 2)  CULTURE, BLOOD (ROUTINE X 2)  LACTIC ACID, PLASMA  LACTIC ACID, PLASMA  CBC WITH DIFFERENTIAL/PLATELET  PROTIME-INR  POC URINE PREG, ED    EKG None  Radiology US  Venous Img Lower  Left (DVT Study) Result Date: 04/17/2023 CLINICAL DATA:  pain, swelling EXAM: LEFT LOWER EXTREMITY VENOUS DOPPLER ULTRASOUND TECHNIQUE: Gray-scale sonography with graded compression, as well as color Doppler and duplex ultrasound were performed to evaluate the lower extremity deep venous systems from the level of the common femoral vein and including the common femoral, femoral, profunda femoral, popliteal and calf veins including the posterior tibial, peroneal and gastrocnemius veins when visible. The superficial great saphenous vein was also interrogated. Spectral Doppler was utilized to evaluate flow at rest and with distal augmentation maneuvers in the common femoral, femoral and popliteal veins. COMPARISON:  None Available. FINDINGS: Examination is limited due to patient related factors. Contralateral common femoral vein and left common femoral vein/profunda femoral  vein and saphenofemoral junction could not be evaluated. Contralateral Common Femoral Vein: Could not be evaluated. Common Femoral Vein: Could not be evaluated. Saphenofemoral Junction: Could not be evaluated. Profunda Femoral Vein: Could not be evaluated. Femoral Vein: No evidence of thrombus. Normal compressibility, respiratory phasicity and response to augmentation. Popliteal Vein: No evidence of thrombus. Normal compressibility, respiratory phasicity and response to augmentation. Calf Veins: No evidence of thrombus. Normal compressibility and flow on color Doppler imaging. Superficial Great Saphenous Vein: No evidence of thrombus. Normal compressibility. Venous Reflux:  None. Other Findings:  None. IMPRESSION: *Limited exam, as described above. However, imaged veins are patent. Electronically Signed   By: Ree Molt M.D.   On: 04/17/2023 16:04   DG Chest Port 1 View Result Date: 04/17/2023 CLINICAL DATA:  [id number] Sepsis (HCC) [id number]. Left leg swelling. Infection. EXAM: PORTABLE CHEST 1 VIEW COMPARISON:  01/26/2020. FINDINGS: Bilateral lung fields are clear. Bilateral costophrenic angles are clear. Normal cardio-mediastinal silhouette. No acute osseous abnormalities. The soft tissues are within normal limits. IMPRESSION: No active disease. Electronically Signed   By: Ree Molt M.D.   On: 04/17/2023 16:01    Procedures Procedures    Medications Ordered in ED Medications  oxyCODONE  (Oxy IR/ROXICODONE ) immediate release tablet 5 mg (5 mg Oral Given 04/17/23 1521)  ketorolac  (TORADOL ) 15 MG/ML injection 15 mg (15 mg Intramuscular Given 04/17/23 1701)  cephALEXin  (KEFLEX ) capsule 500 mg (500 mg Oral Given 04/17/23 1759)  morphine  (PF) 4 MG/ML injection 4 mg (4 mg Intravenous Given 04/17/23 1809)  ondansetron  (ZOFRAN ) injection 4 mg (4 mg Intravenous Given 04/17/23 1809)    ED Course/ Medical Decision Making/ A&P                                 Medical Decision Making This patient presents to the  ED for concern of left lower leg redness and swelling, this involves an extensive number of treatment options, and is a complaint that carries with it a high risk of complications and morbidity.  The differential diagnosis includes this, DVT, abscess, other   Co morbidities that complicate the patient evaluation :   History of lupus, history of vertebral artery dissection    Additional history obtained:  Additional history obtained from EMR External records from outside source obtained and reviewed including labs,  notes, infectious disease notes and pictures   Lab Tests:  I Ordered, and personally interpreted labs.  The pertinent results include: No leukocytosis, lactic acid is normal, CMP is normal, INR normal   Imaging Studies ordered:  I ordered imaging studies including ultrasound left lower extremity venous duplex which shows limited study, but no sign of DVT    Cardiac Monitoring: / EKG:  The patient was maintained on a cardiac monitor.  I personally viewed and interpreted the cardiac monitored which showed an underlying rhythm of: Initially showed sinus tachycardia, improved to sinus rhythm after pain control   Consultations Obtained:  I requested consultation with the infectious disease specialist Dr. Efrain,  and discussed lab and imaging findings as well as pertinent plan - they recommend: Start patient back on Keflex , close follow-up with ID   Problem List / ED Course / Critical interventions / Medication management  Noticed the left lower extremity-patient has been having ongoing infection, started several months ago, got better with Keflex , then Keflex  was stopped and started to get worse again, patient is having intermittent fevers but no fevers today, no leukocytosis, initially tachycardic, feels is likely due to her pain which resolved the tachycardia once improved with pain medication.  Compartments leg are supple, she has good capillary refill in her foot, foot is  not cool to touch.  She is DP and PT pulses with no numbness or tingling in her leg.  Patient reported history of clots so DVT study ordered and was limited, patient reports history of OSA and PTSD and would not fully remove her clothing but I have physician for DVT as this appears to be more consistent with cellulitis, patient has had this in the past that improved with antibiotics.  Discussed with infectious disease as above who recommended restarting Keflex  and close infectious disease follow-up. I ordered medication including morphine   for pain  Reevaluation of the patient after these medicines showed that the patient improved I have reviewed the patients home medicines and have made adjustments as needed      Amount and/or Complexity of Data Reviewed Labs: ordered. Radiology: ordered.  Risk Prescription drug management.           Final Clinical Impression(s) / ED Diagnoses Final diagnoses:  Cellulitis of left lower extremity    Rx / DC Orders ED Discharge Orders          Ordered    oxyCODONE  (ROXICODONE ) 5 MG immediate release tablet  Every 6 hours PRN        04/17/23 1908    cephALEXin  (KEFLEX ) 500 MG capsule  4 times daily        04/17/23 [address] 04/17/23 2013    Pickering, Nathan, MD 04/18/23 1453

## 2023-04-17 NOTE — ED Notes (Signed)
 Patient's leg rewrapped with ace bandages

## 2023-04-17 NOTE — Discharge Instructions (Addendum)
 Pleasure taking care of you today, you are seen in the ER for an infection of your left leg, we are prescribing Keflex , which you have been taking in the past, it is important for you to call the infectious disease doctor tomorrow to schedule close follow-up.  Come back to the ER if you have any new or worsening symptoms specially worsening redness, fever, pain, nausea or vomiting.

## 2023-04-17 NOTE — ED Triage Notes (Signed)
 Pt arrived via POV from home after speaking to her PCP for left leg swelling and infection. Pt reports infectious wound is spreading and reports the infection has been resistant to antibiotics.

## 2023-04-17 NOTE — Telephone Encounter (Signed)
 Chief Complaint: left leg swelling Symptoms: left leg swelling, worsening infection Frequency: since last November and worsening Pertinent Negatives: Patient denies CP, SOB Disposition: [x] ED /[] Urgent Care (no appt availability in office) / [] Appointment(In office/virtual)/ []  [facility]/ [] Home Care/ [] Refused Recommended Disposition /[] [facility]/ []  Follow-up with PCP Additional Notes: Pt reports infection in the left leg after an injury last November. Pt has taken 2 rounds of antibiotics but ultimately infection is worsening. States ID did a biopsy which came back concerning for Staph infection. Biopsy was on 1/15 and leg is getting worse since. Pt starts the skin on her leg is splitting open because the swelling is so severe. States new sores/wounds are present and are draining blood and pus.Rates pain 7-8/10. Currently wrapped with an ACE bandage. Home Health nurse came to her house today and advised pt to call her PCP for an appt. Pt having fevers of 102F but states she was afebrile today. This RN advised pt to go to the ED. Pt agreeable and states she will go to [facility].   Copied from CRM [id number]. Topic: Clinical - Red Word Triage >> Apr 17, 2023  1:33 PM Kinnie H wrote: Red Word that prompted transfer to Nurse Triage:Home health nurse stated to call in to schedule appointment having concerns lot of swelling in left leg, has been seen by dr,  the infection is spreading and the skin is splitting. Needs to be prescribed a antibiotic if possible Reason for Disposition  [1] Swelling is painful to touch AND [2] fever  Answer Assessment - Initial Assessment Questions 1. ONSET: When did the swelling start? (e.g., minutes, hours, days)     Injury back in November, became infected. Previous PCP kept blowing it off and it got so bad, I went to an UC, took 5 days antibiotics, got better, came off meds, got worse. Podiatry said it's not in the bone, but took  antibiotics for another 10 days. Infectious disease swabbed for culture. Did 3 biopsy points. Biopsy on 1/15, no treatment since. 2. LOCATION: What part of the leg is swollen?  Are both legs swollen or just one leg?     Left leg swelling, some sores start bleeding, draining 3. SEVERITY: How bad is the swelling? (e.g., localized; mild, moderate, severe)   - Localized: Small area of swelling localized to one leg.   - MILD pedal edema: Swelling limited to foot and ankle, pitting edema < 1/4 inch (6 mm) deep, rest and elevation eliminate most or all swelling.   - MODERATE edema: Swelling of lower leg to knee, pitting edema > 1/4 inch (6 mm) deep, rest and elevation only partially reduce swelling.   - SEVERE edema: Swelling extends above knee, facial or hand swelling present.      Pt states it is severe - swollen from rim of sock to knee - spreading up and over, pt states she is colorblind. Looks purple. All turning purple. 4. REDNESS: Does the swelling look red or infected?     Yes 5. PAIN: Is the swelling painful to touch? If Yes, ask: How painful is it?   (Scale 1-10; mild, moderate or severe)     7-8/10, wrapped with ACE 6. FEVER: Do you have a fever? If Yes, ask: What is it, how was it measured, and when did it start?      102-103F 7. CAUSE: What do you think is causing the leg swelling?     Staph infection 8. MEDICAL HISTORY:  Do you have a history of blood clots (e.g., DVT), cancer, heart failure, kidney disease, or liver failure?     History of blood clots in legs, arms 9. RECURRENT SYMPTOM: Have you had leg swelling before? If Yes, ask: When was the last time? What happened that time?     Leg is very cold, purple, new sores that are splitting, oozing, and bleeding. No chills, pt states she has waves of heat. Fever of 102-103F. 10. OTHER SYMPTOMS: Do you have any other symptoms? (e.g., chest pain, difficulty breathing)       Pt had a biopsy, resistant form of  Staph found. Took antibiotics - treatment failure. Month since biopsy. Rapidly spreading. More wounds are opening from swelling in the leg. Supposed to go back to PCP in March. Needs to treat Staph they found. Already on Prednisone .  Protocols used: Leg Swelling and Edema-A-AH

## 2023-04-21 ENCOUNTER — Telehealth: Payer: Self-pay | Admitting: Family Medicine

## 2023-04-21 ENCOUNTER — Telehealth: Payer: Self-pay

## 2023-04-21 ENCOUNTER — Emergency Department (HOSPITAL_COMMUNITY)
Admission: EM | Admit: 2023-04-21 | Discharge: 2023-04-21 | Disposition: A | Discharge status: Home / Self Care | Payer: Medicare Other | Attending: Emergency Medicine | Admitting: Emergency Medicine

## 2023-04-21 ENCOUNTER — Other Ambulatory Visit: Payer: Self-pay

## 2023-04-21 ENCOUNTER — Encounter: Payer: Self-pay | Admitting: Internal Medicine

## 2023-04-21 ENCOUNTER — Ambulatory Visit (INDEPENDENT_AMBULATORY_CARE_PROVIDER_SITE_OTHER): Payer: Medicare Other | Admitting: Internal Medicine

## 2023-04-21 VITALS — BP 131/89 | HR 125 | Temp 97.5°F

## 2023-04-21 DIAGNOSIS — R21 Rash and other nonspecific skin eruption: Secondary | ICD-10-CM

## 2023-04-21 DIAGNOSIS — M79605 Pain in left leg: Secondary | ICD-10-CM

## 2023-04-21 DIAGNOSIS — Z7901 Long term (current) use of anticoagulants: Secondary | ICD-10-CM | POA: Insufficient documentation

## 2023-04-21 DIAGNOSIS — M79604 Pain in right leg: Secondary | ICD-10-CM | POA: Insufficient documentation

## 2023-04-21 LAB — CBC WITH DIFFERENTIAL/PLATELET
Abs Immature Granulocytes: 0.03 10*3/uL (ref 0.00–0.07)
Basophils Absolute: 0 10*3/uL (ref 0.0–0.1)
Basophils Relative: 0 %
Eosinophils Absolute: 0.1 10*3/uL (ref 0.0–0.5)
Eosinophils Relative: 1 %
HCT: 41.1 % (ref 36.0–46.0)
Hemoglobin: 13.3 g/dL (ref 12.0–15.0)
Immature Granulocytes: 0 %
Lymphocytes Relative: 20 %
Lymphs Abs: 2.2 10*3/uL (ref 0.7–4.0)
MCH: 30.9 pg (ref 26.0–34.0)
MCHC: 32.4 g/dL (ref 30.0–36.0)
MCV: 95.4 fL (ref 80.0–100.0)
Monocytes Absolute: 0.7 10*3/uL (ref 0.1–1.0)
Monocytes Relative: 7 %
Neutro Abs: 7.9 10*3/uL — ABNORMAL HIGH (ref 1.7–7.7)
Neutrophils Relative %: 72 %
RBC: 4.31 MIL/uL (ref 3.87–5.11)
RDW: 11.9 % (ref 11.5–15.5)
Smear Review: ADEQUATE
WBC: 10.9 10*3/uL — ABNORMAL HIGH (ref 4.0–10.5)
nRBC: 0 % (ref 0.0–0.2)

## 2023-04-21 LAB — COMPREHENSIVE METABOLIC PANEL
ALT: 12 U/L (ref 0–44)
AST: 14 U/L — ABNORMAL LOW (ref 15–41)
Albumin: 4.4 g/dL (ref 3.5–5.0)
Alkaline Phosphatase: 43 U/L (ref 38–126)
Anion gap: 12 (ref 5–15)
BUN: 9 mg/dL (ref 6–20)
CO2: 25 mmol/L (ref 22–32)
Calcium: 9.7 mg/dL (ref 8.9–10.3)
Chloride: 101 mmol/L (ref 98–111)
Creatinine, Ser: 0.53 mg/dL (ref 0.44–1.00)
GFR, Estimated: >60 mL/min (ref >60)
Glucose, Bld: 96 mg/dL (ref 70–99)
Potassium: 3.9 mmol/L (ref 3.5–5.1)
Sodium: 138 mmol/L (ref 135–145)
Total Bilirubin: 0.6 mg/dL (ref 0.0–1.2)
Total Protein: 7.8 g/dL (ref 6.5–8.1)

## 2023-04-21 MED ORDER — HYDROCODONE-ACETAMINOPHEN 5-325 MG PO TABS
1.0000 | ORAL_TABLET | Freq: Once | ORAL | Status: AC
  Administered 2023-04-21: 1 via ORAL
  Filled 2023-04-21: qty 1

## 2023-04-21 MED ORDER — HYDROCODONE-ACETAMINOPHEN 5-325 MG PO TABS: 1.0000 | ORAL_TABLET | ORAL | 0 refills | Status: DC | PRN

## 2023-04-21 MED ORDER — HYDROCODONE-ACETAMINOPHEN 5-325 MG PO TABS: 1.0000 | ORAL_TABLET | Freq: Four times a day (QID) | ORAL | 0 refills | Status: DC | PRN

## 2023-04-21 MED ORDER — LINEZOLID 600 MG PO TABS: 600.0000 mg | ORAL_TABLET | Freq: Two times a day (BID) | ORAL | 0 refills | Status: AC

## 2023-04-21 MED ORDER — SULFAMETHOXAZOLE-TRIMETHOPRIM 800-160 MG PO TABS: 1.0000 | ORAL_TABLET | Freq: Two times a day (BID) | ORAL | 5 refills | Status: DC

## 2023-04-21 MED ORDER — AZITHROMYCIN 250 MG PO TABS: 250.0000 mg | ORAL_TABLET | Freq: Every day | ORAL | 5 refills | Status: DC

## 2023-04-21 MED ORDER — KETOROLAC TROMETHAMINE 15 MG/ML IJ SOLN
15.0000 mg | Freq: Once | INTRAMUSCULAR | Status: AC
  Administered 2023-04-21: 15 mg via INTRAVENOUS
  Filled 2023-04-21: qty 1

## 2023-04-21 MED ORDER — OXYCODONE-ACETAMINOPHEN 7.5-325 MG PO TABS: 1.0000 | ORAL_TABLET | Freq: Four times a day (QID) | ORAL | 0 refills | Status: DC | PRN

## 2023-04-21 NOTE — Telephone Encounter (Signed)
 Patient scheduled for picc line placement on 04/22/23 @ 1:145 pm a [facility]. Patient aware of the appointment.    I have sent [facility] and our pharmacy team a message with orders for the patient and that patient will need first dose in the home.   OPAT Orders Discharge antibiotics to be given via PICC line Discharge antibiotics: Cefoxitin  12 gm IV continuous infusion daily   Duration: 3 month End Date: 07/24/2023   [facility] Care Per Protocol:   Home health RN for IV administration and teaching; PICC line care and labs.     Labs weekly while on IV antibiotics: _x_ CBC with differential __ BMP _x_ CMP _x_ CRP __ ESR __ Vancomycin trough __ CK   __ Please pull PIC at completion of IV antibiotics _x_ Please leave PIC in place until doctor has seen patient or been notified   Fax weekly labs to [phone]

## 2023-04-21 NOTE — Telephone Encounter (Signed)
Copied from CRM [id number]. Topic: Clinical - Medical Advice >> Apr 21, 2023  3:09 PM Dennison Nancy wrote: Reason for CRM:  patient was at the emergency room at [facility] on friday  per  Steffanie Rainwater and have to go back to the Emergency room today still have swelling in her legs thinking it may be a blood clot per  Cathren Harsh (MPOA) [phone].Marland Kitchen  the Triage line put patient under xaje HAsanaj with [facility] which is the wrong provider  just want to make sure everything is going to the [place] [facility] and nothing going over to the old provider Lia Hopping at [facility]

## 2023-04-21 NOTE — ED Triage Notes (Signed)
 Pt here for leg pain, states that she has an active infection in her left leg. recently seen here on Friday for same and place on antibiotics. Endorsing increased swelling and pain. Was told that if the pain or swelling got worse to come in. Left leg is currently wrapped in ace bandage and draining.

## 2023-04-21 NOTE — Telephone Encounter (Signed)
 Copied from CRM [id number]. Topic: Clinical - Prescription Issue >> Apr 21, 2023  3:11 PM Tisa Forester wrote: Reason for CRM: per Aimee Alf (MPOA) [phone].Aaron Aaspatient needing all her medication refills accept for any antibotics or the predniSONE  (DELTASONE ) 5 MG tablet Anthon Kins who is the POA  can not tell agent the names of the medication

## 2023-04-21 NOTE — Progress Notes (Signed)
 Yeah crazy  I wished either surgery or wound care would do it   But the cards we are dealt with!

## 2023-04-21 NOTE — Progress Notes (Addendum)
 [facility]  Reason for Consult:rash LLE Referring Provider: Kelly Patient    Patient Active Problem List   Diagnosis Date Noted   Right leg weakness 11/06/2022   Spotting 11/06/2022   Vertebral artery dissection (HCC) 11/03/2022   Anxiety and depression 10/29/2022   Vitamin B12 deficiency 10/15/2022   Bradycardia 08/27/2022   Autonomic dysfunction 08/27/2022   History of enlargement of pituitary gland 12/25/2017   Moderate malnutrition (HCC) 12/25/2017   Loss of weight 12/25/2017   Vitamin D  deficiency 12/25/2017   Cervical spondylosis 11/27/2017   Fatigue 11/27/2017   Family history of lupus erythematosus 11/27/2017   Thoracic spondylosis without myelopathy 11/27/2017   Ingrown toenail 11/23/2015   Dyspepsia 08/31/2014   Abdominal pain 06/29/2014   Achilles tendinitis of both lower extremities 01/23/2014   Palpitations 04/19/2013   Chest pain 04/19/2013   Sinus tachycardia 04/19/2013   Anxiety 04/19/2013      HPI: Traci Hensley is a 33 y.o. female hx vertebral artery dissection and a chronic left leg wound referred here for same   03/27/23 id clinic f/u Patient appeared to have f/u'ed with wound care twice the last couple weeks A biopsy was not done as wound care was worried it could make her skin condition worse, but an wound swab was sent for bacteria pcr Labs all normal in terms of blood count, albumin/lft, sed rate/crp; bcx also was negative She is developing new lesions and also old worsening lesion today's visit No fever, chill    --------------- Initial visit note with me I reviewed epic charts 02/18/23 -- ed visit at [facility] for 3 week left leg wound -- patient doesn't recall how she got the wound. Had vertebral artery dissection a few months prior (10/2022); repeat brain mra 01/2023 showed healing right vertebral artery dissection and continued on aspirin  with neurology f/u. Exam finding at that time  described multiple abrasions noted to LLE and full thickness wound to lateral aspect that is open and very tender to touch given keflex  and referred to wound clinic   12/17 and 03/03/23 podiatry visit -- reports topical abx ointment use as well. Not yet seen wound clinic. Telfa dressing/gauze wrap use. Reports several allergy to tape and petrolatum. Given hydrocortisone-zinc  and doxycycline .   03/13/2023 id clinic initial visit Patient reports she was planting some mulberry bush at the time this happens. [place] farm with chickens/ducks/cats. There are dogs/cows on nearby properties Patient has fever off and on; last fever a few weeks as high as 107, but many readings are 101. She showed me several pictures. There is almost like a road burn and a clear blister on the distal lateral aspect of her leg. She has a tatoo there that has been there a long time. Then the rash spread circunferentially and upward over the next several weeks with some ulcerates/opens up. Yellowish thin fluid mostly for exudates She reports developing an allergy to doxy so had continued cephalexin  --> finished today 20 days She used topical silvadene  and bactroban  at first when the initial wound open, but then the other lesion would occur at a completely new area.   She is also on prednisone  chronically 5 mg daily for her joint pain given by pcp. She had done dose packs at higher dose at most twice a year    04/21/2023 id clinic visit Saw ed for pain, given cephalexin  No improvement in rash, now macerating again and severe pain  No f/c No n/v/diarrhea Crying Wants something done Not seeing derm yet    Review of Systems: ROS All other ros negative      Past Medical History:  Diagnosis Date   Agoraphobia    Anxiety    B12 deficiency anemia    Bipolar 1 disorder (HCC)    Chronic abdominal pain    Chronic chest pain    Depression    Dystonia    Unknown which type   PTSD (post-traumatic stress  disorder)    Scoliosis    Vertebral artery dissection (HCC) 11/03/2022    Social History   Tobacco Use   Smoking status: Some Days    Current packs/day: 0.00    Types: Cigarettes    Start date: 02/15/2012    Last attempt to quit: 02/14/2014    Years since quitting: 9.1   Smokeless tobacco: Never   Tobacco comments:    rare, social smoker  Vaping Use   Vaping status: Former  Substance Use Topics   Alcohol use: Not Currently   Drug use: No    Family History  Problem Relation Age of Onset   Arrhythmia Mother    Colon cancer Neg Hx     Allergies  Allergen Reactions   Adhesive [Tape] Other (See Comments)    Eats skin almost instantly   Blue Dyes (Parenteral) Other (See Comments)    BLUE DYES IS OK BY ITSELF, BUT CANNOT BE COMBINED WITH GREEN DYES   Contrast Media [Iodinated Contrast Media] Nausea And Vomiting    Liquid version not the IV. Causes Vomiting up blood, shaking and tremors uncontrollable, dizziness and lightheadness   Corn-Containing Products Anaphylaxis and Nausea And Vomiting    ALSO GRASS FAMILY PLANTS, VEGETABLES, ETC.    Green Dyes Other (See Comments)    GREEN DYE IS OK BY ITSELF, BUT CANNOT BE COMBINED WITH BLUE DYE   Nickel Itching, Swelling, Rash and Other (See Comments)   Omnipaque  [Iohexol ]     CT tech notes state "Patient allergy to IV dye"   Onion     Cannot be around onion at all or she has breathing difficulty   Other Nausea Only, Rash and Other (See Comments)    Sunlight causes extreme fatigue , Migraine , Cat Dander  Any type of anesthetic causes her to have prolonged sedation, requiring more than normal doses to reverse them   Petroleum Distillate Itching, Swelling and Rash   Silicone Hives, Swelling and Rash   Bee Venom Hives    Severe disorientation   Cortisporin-Tc [Neomycin-Colist-Hc-Thonzonium] Other (See Comments)    Pain, went deaf   Yellow Jacket Venom Hives    Severe disorientation    OBJECTIVE: There were no vitals filed  for this visit.  There is no height or weight on file to calculate BMI.   Physical Exam General/constitutional: no distress, pleasant HEENT: Normocephalic, PER, Conj Clear, EOMI, Oropharynx clear Neck supple CV: rrr no mrg Lungs: clear to auscultation, normal respiratory effort Abd: Soft, Nontender Ext: no edema Skin: several superficial rash as below; tender to touch of calf Neuro: nonfocal MSK: no peripheral joint swelling/tenderness/warmth; back spines nontender  03/13/2023          03/27/23 pictures Old scab becoming more nodular and there is new lesions that is linear            04/21/23 picture Patient is in severe pain as before Superficial ulcer almost like skin maceration -- rather unusual for nocardia/ntm/sporotrichosis  Lab: Lab Results  Component Value Date   WBC 7.4 04/17/2023   HGB 12.7 04/17/2023   HCT 38.3 04/17/2023   MCV 93.0 04/17/2023   PLT 295 04/17/2023   Last metabolic panel Lab Results  Component Value Date   GLUCOSE 101 (H) 04/17/2023   NA 137 04/17/2023   K 3.9 04/17/2023   CL 104 04/17/2023   CO2 22 04/17/2023   BUN 8 04/17/2023   CREATININE 0.55 04/17/2023   GFRNONAA >60 04/17/2023   CALCIUM  9.1 04/17/2023   PROT 7.3 04/17/2023   ALBUMIN 4.3 04/17/2023   BILITOT 0.5 04/17/2023   ALKPHOS 36 (L) 04/17/2023   AST 16 04/17/2023   ALT 11 04/17/2023   ANIONGAP 11 04/17/2023    Microbiology:  Serology:  Imaging:   Assessment/plan: Problem List Items Addressed This Visit   None      Chronic pred 5 mg not high dose so not considered "immunosuppressed" Use of topical abx could cause contact dermatitis but lesions per her report occur in new area not related to topical medication use With exposure and clinical history suggest ntm/nocardia/nocardia The rash is rather superficial  Would stop all systemic and topical antibiotics or any other topical stuffs at this time  She will see wound care next  week and we can ask for skin biopsy and AFB/fungal culture at the edge of the wounds (2 places ideally -- one with the most recent occurring wound and one with the most severe appearing wound), and if possible uw 16s for bacteria/afb/fungal The sample for culture/rna testing would need to be in sterile saline  F/u with me in 3-4 weeks. Again no abx   Chronic pred/joint pain recommends ongoing rheumatology management   Basic labs including blood cx today given fever. Advise her to record vitals for me  Forwarding chart to Buena Carmine of wound care regarding the testing/biopsy  ---------- 03/27/23 id assessment Worsening rash Crp/esr low but by itself really doesn't r/o some smoldering infections  This is NOT regular typical cellulitis and I advised her again do not take any antibiotics in the case that this would muddy the water or creat resistant to those organisms that require multiple concomittant antibiotics  Will refer her to dermatology for pathology (we can not do pathology here) in case today's punch biopsy for afb/fungal/bacterial culture  F/u around 5 weeks (if I see positive culture will see if I can get her back sooner)   Total Time Spent: 45 minutes    --------------- 04/21/23 id assessment The culture shows only staph but I do not believe it is of any contribution. She has been on cephalex at it is getting worse (given during recent ed visit) Severe pain No f/c   We need pathology and she has no dermatology appointment yet.   I'll refer her back to pcp for pain management as well  For now discuss risk benefit and she really wants to empirically treat for "infection."  I'll try to cover for nocardia/m-abscessus with the below regimen Bactrim  ds 1 tab bid Azith 250 mg po daily Linezolid  600 mg po bid for 4 weeks then daily there after IV cefoxitin  12 gram continuous infusion  At 1 month I plan to reduce linezolid  to once a day dosing   OPAT  Orders Discharge antibiotics to be given via PICC line Discharge antibiotics: Cefoxitin  12 gm IV continuous infusion daily  Duration: 3 month End Date: 07/24/2023  [facility] Care Per Protocol:  Home health RN for IV  administration and teaching; PICC line care and labs.    Labs weekly while on IV antibiotics: _x_ CBC with differential __ BMP _x_ CMP _x_ CRP __ ESR __ Vancomycin trough __ CK  __ Please pull PIC at completion of IV antibiotics _x_ Please leave PIC in place until doctor has seen patient or been notified  Fax weekly labs to [phone]  F/u 2-3 weeks   Adjustment of abx at 1 month to decrease linezolid , or stopping all if no response at all   Percocet 8 days for pain. F/u pcp for pain management. Chart forwarded to pcp  Follow-up: Return in about 3 weeks (around 05/12/2023).   I spent more than 50 minute reviewing data/chart, and coordinating care, providing direct face to face time providing counseling/discussing diagnostics/treatment plan with patient and treatment team  Jamesetta Mcbride, MD [facility] 04/21/2023, 11:16 AM

## 2023-04-21 NOTE — Patient Instructions (Addendum)
 I understand you are having a lot of pain and in a difficult situation   The risk of this being either nocardia or non-tuberculous mycobacteria is low and ideally need antibiotics testing/culture to guide therapy. If we miss it by one or two antibiotics it is not good.  The way it looks is unusual for either.  We can try to treat for now empirically and see   But we really need a dermatology visit for skin biopsy, to look for other noninfectious cause    We can try 1 iv antibiotics (to be placed once picc is in), and 3 oral antibiotics Further modification to be made around 3-4 weeks If things continue to look worse at 3-4 weeks I would advise stopping abx and focus on dermatology evaluation   1) azithromycin  250 mg once a day 2) bactrim  double strength tablet 1 tablet twice a day 3) linezolid  600 mg twice a day  4) iv cefoxitin  3 gram every 8 hours    See me in 2-3 weeks     I have given you some percocet for pain. Follow up with primary care for further pain management Again please see dermatology

## 2023-04-21 NOTE — Telephone Encounter (Signed)
Copied from CRM [id number]. Topic: Clinical - Medical Advice >> Apr 21, 2023  3:15 PM Dennison Nancy wrote: Reason for CRM: Patient is on a vitamin B injection and the vitamin D pill was suggested by the emergency room doctor if can be change to a vitamin D injection reason do not absorb the pills well   Patient is going have a pick line in for the iv antibiotics tomorrow at 2:00pm at [facility]

## 2023-04-21 NOTE — Discharge Instructions (Addendum)
 You are seen in the ER today for leg pain and swelling.  Follow-up with your infectious disease doctor tomorrow, so you can start your antibiotics.

## 2023-04-21 NOTE — ED Provider Notes (Addendum)
[place] EMERGENCY DEPARTMENT AT [facility] Provider Note   CSN: [id number] Arrival date & time: 04/21/23  1517     History  Chief Complaint  Patient presents with   Leg Pain    Pt here for leg pain, states that she has an active infection in her left leg. recently seen here on Friday for same and place on antibiotics. Endorsing increased swelling and pain. Was told that if the pain or swelling got worse to come in. Left leg is currently wrapped in ace bandage and draining. Pt concerned about a clot in leg.    Traci Hensley is a 33 y.o. female.  As for her worsening leg pain.  Is been ongoing possible infection in the leg and is following up with infectious disease, seen by myself on 04/17/2023 and put on Keflex after discussion with infectious disease and patient reports it has gotten worse and is more painful.  She states she is on infectious disease, they are placing PICC line and started her on antibiotics tomorrow while she awaits dermatology follow-up and biopsies.  She states she is worried today because her home health nurse of the leg, they noted some swollen lymph nodes in her groin and her leg was more swollen.  She states they wanted her to get an ultrasound to rule out blood clot which was also done on 04/17/2023 and was negative though the exam was limited.   Also notes that it is still swollen and the Toradol she got last time seem to help   Leg Pain      Home Medications Prior to Admission medications   Medication Sig Start Date End Date Taking? Authorizing Provider  acetaminophen (TYLENOL) 500 MG tablet Take 2,000-2,500 mg by mouth every 6 (six) hours as needed for mild pain (pain score 1-3).   Yes [provider]  albuterol (PROVENTIL HFA;VENTOLIN HFA) 108 (90 Base) MCG/ACT inhaler Inhale 2 puffs into the lungs every 6 (six) hours as needed for wheezing or shortness of breath.   Yes [provider]  baclofen (LIORESAL) 20 MG tablet Take 20  mg by mouth as needed for muscle spasms. 10/14/20  Yes [provider]  BREO ELLIPTA 100-25 MCG/ACT AEPB Inhale 1 puff into the lungs daily.   Yes [provider]  clopidogrel (PLAVIX) 75 MG tablet Take 75 mg by mouth daily.   Yes [provider]  cyanocobalamin (VITAMIN B12) 1000 MCG/ML injection INJECT 1 ML INTRAMUSCULARLY EVERY OTHER WEEK 10/17/22  Yes Del Newman Nip, [place], FNP  cyclobenzaprine (FLEXERIL) 10 MG tablet Take 10 mg by mouth as needed for muscle spasms. 10/14/20  Yes [provider]  EPINEPHrine 0.3 mg/0.3 mL IJ SOAJ injection Inject 0.3 mg into the muscle once. As needed for allergic reaction   Yes [provider]  ergocalciferol (VITAMIN D2) 1.25 MG (50000 UT) capsule Take 50,000 Units by mouth once a week.   Yes [provider]  gabapentin (NEURONTIN) 400 MG capsule Take 400 mg by mouth 3 (three) times daily.   Yes [provider]  ibuprofen (ADVIL) 200 MG tablet Take 1,600 mg by mouth every 6 (six) hours as needed for mild pain (pain score 1-3).   Yes [provider]  montelukast (SINGULAIR) 10 MG tablet Take 10 mg by mouth daily. 04/11/22  Yes [provider]  mupirocin ointment (BACTROBAN) 2 % Apply to skin rash on leg/ankle once daily. 03/03/23  Yes McCaughan, Dia D, DPM  nitroGLYCERIN (NITROSTAT) 0.4  MG SL tablet Place 1 tablet (0.4 mg total) under the tongue every 5 (five) minutes as needed for chest pain. 06/17/14  Yes Jake Bathe, MD  NONFORMULARY OR COMPOUNDED ITEM Apply 1 application  topically 4 (four) times daily. Katamine/Gabapentin/Baclofen/Lidocaine/Menthol Cream   Yes [provider]  norethindrone-ethinyl estradiol (NORTREL 0.5/35, 28,) 0.5-35 MG-MCG tablet Take 1 tablet by mouth daily.   Yes [provider]  olmesartan (BENICAR) 20 MG tablet Take 20 mg by mouth daily.   Yes [provider]  ondansetron (ZOFRAN-ODT) 8 MG disintegrating tablet Take 8 mg by mouth  as needed for nausea or vomiting. 02/12/21  Yes [provider]  predniSONE (DELTASONE) 5 MG tablet Take 1 tablet (5 mg total) by mouth daily. 04/02/23  Yes Milian, Aleen Campi, FNP  SSD 1 % cream Apply topically as needed. Patient taking differently: Apply 1 Application topically as needed (rash). 03/23/21  Yes Chandrasekhar, Mahesh A, MD  triamcinolone cream (KENALOG) 0.1 % Apply 1 Application topically daily. 03/17/23  Yes [provider]  azithromycin (ZITHROMAX) 250 MG tablet Take 1 tablet (250 mg total) by mouth daily. 04/21/23 10/18/23  Vu, Tonita Phoenix, MD  cephALEXin (KEFLEX) 500 MG capsule Take 1 capsule (500 mg total) by mouth 4 (four) times daily for 7 days. 04/17/23 04/24/23  Carmel Sacramento A, PA-C  linezolid (ZYVOX) 600 MG tablet Take 1 tablet (600 mg total) by mouth 2 (two) times daily. 04/21/23 05/21/23  Vu, Gershon Mussel T, MD  metoprolol succinate (TOPROL-XL) 25 MG 24 hr tablet Take 25 mg by mouth daily. 03/26/23   [provider]  oxyCODONE-acetaminophen (PERCOCET) 7.5-325 MG tablet Take 1 tablet by mouth every 6 (six) hours as needed for up to 8 days for severe pain (pain score 7-10). 04/21/23 04/29/23  Vu, Tonita Phoenix, MD  sulfamethoxazole-trimethoprim (BACTRIM DS) 800-160 MG tablet Take 1 tablet by mouth 2 (two) times daily. 04/21/23 10/18/23  Raymondo Band, MD      Allergies    Adhesive [tape], Blue dyes (parenteral), Contrast media [iodinated contrast media], Corn-containing products, Green dyes, Nickel, Omnipaque [iohexol], Onion, Other, Petroleum distillate, Silicone, Bee venom, Cortisporin-tc [neomycin-colist-hc-thonzonium], and Yellow jacket venom    Review of Systems   Review of Systems  Physical Exam Updated Vital Signs BP (!) 139/108   Pulse (!) 116   Temp 98.2 F (36.8 C)   Ht 5' (1.524 m)   Wt 54.4 kg   SpO2 100%   BMI 23.44 kg/m  Physical Exam Vitals and nursing note reviewed.  Constitutional:      General: She is not in acute distress.    Appearance: She  is well-developed.  HENT:     Head: Normocephalic and atraumatic.     Mouth/Throat:     Mouth: Mucous membranes are moist.  Eyes:     Extraocular Movements: Extraocular movements intact.     Conjunctiva/sclera: Conjunctivae normal.     Pupils: Pupils are equal, round, and reactive to light.  Cardiovascular:     Rate and Rhythm: Normal rate and regular rhythm.     Heart sounds: No murmur heard. Pulmonary:     Effort: Pulmonary effort is normal. No respiratory distress.     Breath sounds: Normal breath sounds.  Abdominal:     Palpations: Abdomen is soft.     Tenderness: There is no abdominal tenderness.  Musculoskeletal:        General: No swelling.     Cervical back: Neck supple.  Skin:    General:  Skin is warm and dry.     Capillary Refill: Capillary refill takes less than 2 seconds.     Findings: Rash present.  Neurological:     General: No focal deficit present.     Mental Status: She is alert and oriented to person, place, and time.  Psychiatric:        Mood and Affect: Mood normal.     ED Results / Procedures / Treatments   Labs (all labs ordered are listed, but only abnormal results are displayed) Labs Reviewed  CBC WITH DIFFERENTIAL/PLATELET - Abnormal; Notable for the following components:      Result Value   WBC 10.9 (*)    Neutro Abs 7.9 (*)    All other components within normal limits  COMPREHENSIVE METABOLIC PANEL - Abnormal; Notable for the following components:   AST 14 (*)    All other components within normal limits    EKG None  Radiology No results found.  Procedures Procedures    Medications Ordered in ED Medications  ketorolac (TORADOL) 15 MG/ML injection 15 mg (15 mg Intravenous Given 04/21/23 1853)  HYDROcodone-acetaminophen (NORCO/VICODIN) 5-325 MG per tablet 1 tablet (1 tablet Oral Given 04/21/23 1853)    ED Course/ Medical Decision Making/ A&P                                 Medical Decision Making DDx: Cellulitis, abscess,  contact dermatitis, DVT, other  Course: Patient has ongoing left leg pain, seen on the 6 for same start on Keflex and has gotten worse.  She is on infectious disease today and they are starting her on some PICC line antibiotics tomorrow.  She comes in today because she feels like the swelling is worse on her home health nurse told her to have DVT ruled out.  She had negative ultrasound, although limited due to her not wanting to remove her pants on February 6, she is really concerned because she has history of DVT in the past. Her pain was well-controlled with Toradol and oxycodone in the ED, initially heart rate was 116, likely due to pain, this resolved after pain control.  She has been afebrile here.  She looks well, do not feel she needs further workup for this but due to her concern of DVT will order ultrasound for outpatient.  Given recent negative DVT, do not feel she needs empiric anticoagulation as this seems to be an ongoing infectious issue.  Additionally she is on Plavix which would increase risk of bleeding.  She is agreeable plan of care and discharge.  Amount and/or Complexity of Data Reviewed Labs: ordered.  Risk Prescription drug management.           Final Clinical Impression(s) / ED Diagnoses Final diagnoses:  Right leg pain    Rx / DC Orders ED Discharge Orders          Ordered    Lower Ext Left Venous US       Comments: IMPORTANT PATIENT INSTRUCTIONS:  Your ED provider has recommended an Outpatient Ultrasound.  Please call [phone] to schedule an appointment.  If your appointment is scheduled for a Saturday, Sunday or holiday, please go to the [facility] Emergency Department Registration Desk at least 15 minutes prior to your appointment time and tell them you are there for an ultrasound.    If your appointment is scheduled for a weekday (Monday-Friday), please go directly to the  [facility] Radiology Department at least 15 minutes prior to your appointment  time and tell them you are there for an ultrasound.  Please call [phone] with questions.   04/21/23 2131              Ma Rings, PA-C 04/21/23 2136    Ma Rings, PA-C 04/21/23 2155    Gloris Manchester, MD 04/22/23 [phone]

## 2023-04-22 ENCOUNTER — Telehealth: Payer: Self-pay | Admitting: Family Medicine

## 2023-04-22 ENCOUNTER — Emergency Department (HOSPITAL_COMMUNITY)
Admission: EM | Admit: 2023-04-22 | Discharge: 2023-04-23 | Disposition: A | Discharge status: Home / Self Care | Payer: Medicare Other | Attending: Emergency Medicine | Admitting: Emergency Medicine

## 2023-04-22 ENCOUNTER — Ambulatory Visit (HOSPITAL_COMMUNITY)
Admission: RE | Admit: 2023-04-22 | Discharge: 2023-04-22 | Disposition: A | Discharge status: Home / Self Care | Payer: Medicare Other | Source: Ambulatory Visit | Attending: Physician Assistant | Admitting: Physician Assistant

## 2023-04-22 ENCOUNTER — Other Ambulatory Visit: Payer: Self-pay

## 2023-04-22 ENCOUNTER — Emergency Department (HOSPITAL_COMMUNITY): Payer: Medicare Other

## 2023-04-22 ENCOUNTER — Other Ambulatory Visit: Payer: Self-pay | Admitting: Family Medicine

## 2023-04-22 ENCOUNTER — Telehealth: Payer: Self-pay | Admitting: Internal Medicine

## 2023-04-22 ENCOUNTER — Ambulatory Visit (HOSPITAL_COMMUNITY)
Admission: RE | Admit: 2023-04-22 | Discharge: 2023-04-22 | Disposition: A | Discharge status: Home / Self Care | Payer: Medicare Other | Source: Ambulatory Visit | Attending: Internal Medicine | Admitting: Internal Medicine

## 2023-04-22 DIAGNOSIS — Z7902 Long term (current) use of antithrombotics/antiplatelets: Secondary | ICD-10-CM | POA: Insufficient documentation

## 2023-04-22 DIAGNOSIS — R002 Palpitations: Secondary | ICD-10-CM | POA: Diagnosis present

## 2023-04-22 DIAGNOSIS — Z79899 Other long term (current) drug therapy: Secondary | ICD-10-CM | POA: Insufficient documentation

## 2023-04-22 DIAGNOSIS — R29898 Other symptoms and signs involving the musculoskeletal system: Secondary | ICD-10-CM

## 2023-04-22 DIAGNOSIS — M79605 Pain in left leg: Secondary | ICD-10-CM | POA: Diagnosis present

## 2023-04-22 DIAGNOSIS — Z86718 Personal history of other venous thrombosis and embolism: Secondary | ICD-10-CM | POA: Diagnosis not present

## 2023-04-22 DIAGNOSIS — R21 Rash and other nonspecific skin eruption: Secondary | ICD-10-CM

## 2023-04-22 DIAGNOSIS — G894 Chronic pain syndrome: Secondary | ICD-10-CM

## 2023-04-22 DIAGNOSIS — I7774 Dissection of vertebral artery: Secondary | ICD-10-CM

## 2023-04-22 LAB — CULTURE, BLOOD (ROUTINE X 2)
Culture: NO GROWTH
Culture: NO GROWTH
Special Requests: ADEQUATE
Special Requests: ADEQUATE

## 2023-04-22 LAB — CBC
HCT: 37.9 % (ref 36.0–46.0)
Hemoglobin: 12.1 g/dL (ref 12.0–15.0)
MCH: 30 pg (ref 26.0–34.0)
MCHC: 31.9 g/dL (ref 30.0–36.0)
MCV: 94 fL (ref 80.0–100.0)
Platelets: 255 10*3/uL (ref 150–400)
RBC: 4.03 MIL/uL (ref 3.87–5.11)
RDW: 11.9 % (ref 11.5–15.5)
WBC: 6.8 10*3/uL (ref 4.0–10.5)
nRBC: 0 % (ref 0.0–0.2)

## 2023-04-22 LAB — BASIC METABOLIC PANEL
Anion gap: 9 (ref 5–15)
BUN: 13 mg/dL (ref 6–20)
CO2: 24 mmol/L (ref 22–32)
Calcium: 9 mg/dL (ref 8.9–10.3)
Chloride: 106 mmol/L (ref 98–111)
Creatinine, Ser: 0.6 mg/dL (ref 0.44–1.00)
GFR, Estimated: >60 mL/min (ref >60)
Glucose, Bld: 106 mg/dL — ABNORMAL HIGH (ref 70–99)
Potassium: 4.1 mmol/L (ref 3.5–5.1)
Sodium: 139 mmol/L (ref 135–145)

## 2023-04-22 LAB — TROPONIN I (HIGH SENSITIVITY): Troponin I (High Sensitivity): 3 ng/L (ref <18)

## 2023-04-22 MED ORDER — LIDOCAINE HCL 1 % IJ SOLN: 20.0000 mL | Freq: Once | INTRAMUSCULAR | Status: DC

## 2023-04-22 MED ORDER — HEPARIN SOD (PORK) LOCK FLUSH 100 UNIT/ML IV SOLN
INTRAVENOUS | Status: AC
  Filled 2023-04-22: qty 5

## 2023-04-22 MED ORDER — LIDOCAINE HCL 1 % IJ SOLN
INTRAMUSCULAR | Status: AC
  Filled 2023-04-22: qty 20

## 2023-04-22 NOTE — Telephone Encounter (Signed)
Copied from CRM [id number]. Topic: Clinical - Medication Refill >> Apr 22, 2023  9:39 AM Shelah Lewandowsky wrote: Most Recent Primary Care Visit:  Provider: Neale Burly CAPPS  Department: [facility]  Visit Type: OFFICE VISIT  Date: 04/02/2023  Medication: clopidogrel (PLAVIX) 75 MG tablet baclofen (LIORESAL) 20 MG tablet BREO ELLIPTA 100-25 MCG/ACT AEPB  albuterol (PROVENTIL HFA;VENTOLIN HFA) 108 (90 Base) MCG/ACT inhaler cyanocobalamin (VITAMIN B12) 1000 MCG/ML injection cyclobenzaprine (FLEXERIL) 10 MG tablet EPINEPHrine 0.3 mg/0.3 mL IJ SOAJ injection ergocalciferol (VITAMIN D2) 1.25 MG (50000 UT) capsule gabapentin (NEURONTIN) 400 MG capsule montelukast (SINGULAIR) 10 MG tablet  mupirocin ointment (BACTROBAN) 2 % nitroGLYCERIN (NITROSTAT) 0.4 MG SL tablet norethindrone-ethinyl estradiol (NORTREL 0.5/35, 28,) 0.5-35 MG-MCG tablet olmesartan (BENICAR) 20 MG tablet  ondansetron (ZOFRAN-ODT) 8 MG disintegrating tablet SSD 1 % cream triamcinolone cream (KENALOG) 0.1 %  Has the patient contacted their pharmacy? No (Agent: If no, request that the patient contact the pharmacy for the refill. If patient does not wish to contact the pharmacy document the reason why and proceed with request.) (Agent: If yes, when and what did the pharmacy advise?)  Is this the correct pharmacy for this prescription? Yes If no, delete pharmacy and type the correct one.  This is the patient's preferred pharmacy:  [facility] [address], [place] - [address] [place] [address] [place] [address] Phone: [phone] Fax: [phone]   Has the prescription been filled recently? Yes  Is the patient out of the medication? Yes  Has the patient been seen for an appointment in the last year OR does the patient have an upcoming appointment? Yes  Can we respond through MyChart? No  Agent: Please be advised that Rx refills may take up to 3 business days. We ask that you follow-up  with your pharmacy.

## 2023-04-22 NOTE — Telephone Encounter (Signed)
[facility] nurse not comfortable giving patient first iv abx dose at home.

## 2023-04-22 NOTE — Telephone Encounter (Signed)
Patient states that she has been on the oral vit D 50,000 units for years but her levels are still low.  States that the ER told about the injections to possibly help

## 2023-04-22 NOTE — Telephone Encounter (Unsigned)
Copied from CRM [id number]. Topic: Clinical - Medication Question >> Apr 22, 2023  9:53 AM Victorino Dike T wrote: Reason for CRM: asking for the Vitamin D as injection instead of pill, would like 90 day supply of all meds.  Getting a picc line at 2pm today.  [facility] will call about IV antibiotic if Dr Renold Don does not do it.  Need Occupational Therapy sent to [facility] instead of [facility]. Needs prescriptions filled today.

## 2023-04-22 NOTE — Procedures (Signed)
PROCEDURE SUMMARY:  Successful placement of single lumen PICC line to right brachial vein. Length 32 cm Tip at lower SVC/RA PICC capped No complications Ready for use  EBL < 5 mL   Daiki Dicostanzo H Sura Canul PA-C 04/22/2023, 2:43 PM

## 2023-04-22 NOTE — Telephone Encounter (Signed)
Spoke with Pam with [facility] and patient will not need first dose in the home.  Traci Hensley Traci Hensley Resides, CMA

## 2023-04-22 NOTE — ED Triage Notes (Addendum)
Pt had PICC line placed today in right arm for IV antibiotics. Reports she feels like her heart is "skipping beats". C/o chest tightness and SOB.  Pt called provider and was told to come to ED to be evaluated.

## 2023-04-22 NOTE — Telephone Encounter (Signed)
Thank you :)

## 2023-04-23 LAB — TROPONIN I (HIGH SENSITIVITY): Troponin I (High Sensitivity): 3 ng/L (ref <18)

## 2023-04-23 NOTE — ED Notes (Signed)
Pt took electrodes off while waiting in the waiting room. Went to place pt back on monitor when the pt asked to not place electrodes back on due to breaking out. RN made aware.

## 2023-04-23 NOTE — Telephone Encounter (Signed)
lmtcb

## 2023-04-23 NOTE — Telephone Encounter (Signed)
Sent in new referral for patient to established with OT at [facility]. Please have patient schedule an appt to discuss med refills. If Dr. Renold Don does not order antibiotic, I will not order one as I defer to ID and dermatology for treatment. Working with pharmacy to see if we can do Vitamin D injections.

## 2023-04-23 NOTE — ED Provider Notes (Signed)
[place] EMERGENCY DEPARTMENT AT [facility] Provider Note   CSN: [id number] Arrival date & time: 04/22/23  2120     History  Chief Complaint  Patient presents with   Palpitations    Traci Hensley is a 33 y.o. female.  Presents to the emergency department with concerns over heart palpitations.  Patient reports a history of dysautonomia and palpitations but had significant amount of palpitations earlier tonight.  She is concerned that it is related to the PICC line that was placed earlier today.       Home Medications Prior to Admission medications   Medication Sig Start Date End Date Taking? Authorizing Provider  acetaminophen (TYLENOL) 500 MG tablet Take 2,000-2,500 mg by mouth every 6 (six) hours as needed for mild pain (pain score 1-3).    [provider]  albuterol (PROVENTIL HFA;VENTOLIN HFA) 108 (90 Base) MCG/ACT inhaler Inhale 2 puffs into the lungs every 6 (six) hours as needed for wheezing or shortness of breath.    [provider]  azithromycin (ZITHROMAX) 250 MG tablet Take 1 tablet (250 mg total) by mouth daily. 04/21/23 10/18/23  Vu, Tonita Phoenix, MD  baclofen (LIORESAL) 20 MG tablet Take 20 mg by mouth as needed for muscle spasms. 10/14/20   [provider]  BREO ELLIPTA 100-25 MCG/ACT AEPB Inhale 1 puff into the lungs daily.    [provider]  cephALEXin (KEFLEX) 500 MG capsule Take 1 capsule (500 mg total) by mouth 4 (four) times daily for 7 days. 04/17/23 04/24/23  Carmel Sacramento A, PA-C  clopidogrel (PLAVIX) 75 MG tablet Take 75 mg by mouth daily.    [provider]  cyanocobalamin (VITAMIN B12) 1000 MCG/ML injection INJECT 1 ML INTRAMUSCULARLY EVERY OTHER WEEK 10/17/22   Del Newman Nip, Tenna Child, FNP  cyclobenzaprine (FLEXERIL) 10 MG tablet Take 10 mg by mouth as needed for muscle spasms. 10/14/20   [provider]  EPINEPHrine 0.3 mg/0.3 mL IJ SOAJ injection Inject 0.3 mg into the muscle once. As needed for  allergic reaction    [provider]  ergocalciferol (VITAMIN D2) 1.25 MG (50000 UT) capsule Take 50,000 Units by mouth once a week.    [provider]  gabapentin (NEURONTIN) 400 MG capsule Take 400 mg by mouth 3 (three) times daily.    [provider]  HYDROcodone-acetaminophen (NORCO/VICODIN) 5-325 MG tablet Take 1 tablet by mouth every 6 (six) hours as needed. 04/21/23   Carmel Sacramento A, PA-C  ibuprofen (ADVIL) 200 MG tablet Take 1,600 mg by mouth every 6 (six) hours as needed for mild pain (pain score 1-3).    [provider]  linezolid (ZYVOX) 600 MG tablet Take 1 tablet (600 mg total) by mouth 2 (two) times daily. 04/21/23 05/21/23  Vu, Gershon Mussel T, MD  metoprolol succinate (TOPROL-XL) 25 MG 24 hr tablet Take 25 mg by mouth daily. 03/26/23   [provider]  montelukast (SINGULAIR) 10 MG tablet Take 10 mg by mouth daily. 04/11/22   [provider]  mupirocin ointment (BACTROBAN) 2 % Apply to skin rash on leg/ankle once daily. 03/03/23   McCaughan, Dia D, DPM  nitroGLYCERIN (NITROSTAT) 0.4 MG SL tablet Place 1 tablet (0.4 mg total) under the tongue every 5 (five) minutes as needed for chest pain. 06/17/14   Jake Bathe, MD  NONFORMULARY OR COMPOUNDED ITEM Apply 1 application  topically 4 (four) times daily. Katamine/Gabapentin/Baclofen/Lidocaine/Menthol Cream    [provider]  norethindrone-ethinyl estradiol (NORTREL 0.5/35, 28,)  0.5-35 MG-MCG tablet Take 1 tablet by mouth daily.    [provider]  olmesartan (BENICAR) 20 MG tablet Take 20 mg by mouth daily.    [provider]  ondansetron (ZOFRAN-ODT) 8 MG disintegrating tablet Take 8 mg by mouth as needed for nausea or vomiting. 02/12/21   [provider]  oxyCODONE-acetaminophen (PERCOCET) 7.5-325 MG tablet Take 1 tablet by mouth every 6 (six) hours as needed for up to 8 days for severe pain (pain score 7-10). 04/21/23 04/29/23  Vu, Gershon Mussel T, MD  predniSONE  (DELTASONE) 5 MG tablet Take 1 tablet (5 mg total) by mouth daily. 04/02/23   Milian, Aleen Campi, FNP  SSD 1 % cream Apply topically as needed. Patient taking differently: Apply 1 Application topically as needed (rash). 03/23/21   Christell Constant, MD  sulfamethoxazole-trimethoprim (BACTRIM DS) 800-160 MG tablet Take 1 tablet by mouth 2 (two) times daily. 04/21/23 10/18/23  Vu, Gershon Mussel T, MD  triamcinolone cream (KENALOG) 0.1 % Apply 1 Application topically daily. 03/17/23   [provider]      Allergies    Adhesive [tape], Blue dyes (parenteral), Contrast media [iodinated contrast media], Corn-containing products, Green dyes, Nickel, Omnipaque [iohexol], Onion, Other, Petroleum distillate, Silicone, Bee venom, Cortisporin-tc [neomycin-colist-hc-thonzonium], and Yellow jacket venom    Review of Systems   Review of Systems  Physical Exam Updated Vital Signs BP 125/78   Pulse 85   Temp 98.1 F (36.7 C) (Oral)   Resp 20   SpO2 99%  Physical Exam Vitals and nursing note reviewed.  Constitutional:      General: She is not in acute distress.    Appearance: She is well-developed.  HENT:     Head: Normocephalic and atraumatic.     Mouth/Throat:     Mouth: Mucous membranes are moist.  Eyes:     General: Vision grossly intact. Gaze aligned appropriately.     Extraocular Movements: Extraocular movements intact.     Conjunctiva/sclera: Conjunctivae normal.  Cardiovascular:     Rate and Rhythm: Normal rate and regular rhythm.     Pulses: Normal pulses.     Heart sounds: Normal heart sounds, S1 normal and S2 normal. No murmur heard.    No friction rub. No gallop.  Pulmonary:     Effort: Pulmonary effort is normal. No respiratory distress.     Breath sounds: Normal breath sounds.  Abdominal:     General: Bowel sounds are normal.     Palpations: Abdomen is soft.     Tenderness: There is no abdominal tenderness. There is no guarding or rebound.     Hernia: No hernia is  present.  Musculoskeletal:        General: No swelling.     Cervical back: Full passive range of motion without pain, normal range of motion and neck supple. No spinous process tenderness or muscular tenderness. Normal range of motion.     Right lower leg: No edema.     Left lower leg: No edema.  Skin:    General: Skin is warm and dry.     Capillary Refill: Capillary refill takes less than 2 seconds.     Findings: No ecchymosis, erythema, rash or wound.  Neurological:     General: No focal deficit present.     Mental Status: She is alert and oriented to person, place, and time.     GCS: GCS eye subscore is 4. GCS verbal subscore is 5. GCS motor subscore is 6.  Cranial Nerves: Cranial nerves 2-12 are intact.     Sensory: Sensation is intact.     Motor: Motor function is intact.     Coordination: Coordination is intact.  Psychiatric:        Attention and Perception: Attention normal.        Mood and Affect: Mood normal.        Speech: Speech normal.        Behavior: Behavior normal.     ED Results / Procedures / Treatments   Labs (all labs ordered are listed, but only abnormal results are displayed) Labs Reviewed  BASIC METABOLIC PANEL - Abnormal; Notable for the following components:      Result Value   Glucose, Bld 106 (*)    All other components within normal limits  CBC  TROPONIN I (HIGH SENSITIVITY)  TROPONIN I (HIGH SENSITIVITY)    EKG None  Radiology DG Chest 2 View Result Date: 04/22/2023 CLINICAL DATA:  Chest pain, weakness EXAM: CHEST - 2 VIEW COMPARISON:  04/17/2023 FINDINGS: Frontal and lateral views of the chest demonstrate an unremarkable cardiac silhouette. Right-sided PICC tip within the superior vena cava. No airspace disease, effusion, or pneumothorax. No acute bony abnormalities. IMPRESSION: 1. Interval right-sided PICC line placement.  No complication. 2. No acute intrathoracic process. Electronically Signed   By: Sharlet Salina M.D.   On: 04/22/2023  22:38   IR PICC PLACEMENT LEFT >5 YRS INC IMG GUIDE Result Date: 04/22/2023 INDICATION: 33 year old female with skin maturation who is in need of long-term IV antibiotic treatment. Request for PICC line placement. EXAM: ULTRASOUND AND FLUOROSCOPIC GUIDED PICC LINE INSERTION MEDICATIONS: 1% lidocaine CONTRAST:  None FLUOROSCOPY TIME:  6 seconds (less than 1 mGy) COMPLICATIONS: None immediate. TECHNIQUE: The procedure, risks, benefits, and alternatives were explained to the patient and informed written consent was obtained. The right upper extremity was prepped with chlorhexidine in a sterile fashion, and a sterile drape was applied covering the operative field. Maximum barrier sterile technique with sterile gowns and gloves were used for the procedure. A timeout was performed prior to the initiation of the procedure. Local anesthesia was provided with 1% lidocaine. After the overlying soft tissues were anesthetized with 1% lidocaine, a micropuncture kit was utilized to access the right brachial vein. Real-time ultrasound guidance was utilized for vascular access including the acquisition of a permanent ultrasound image documenting patency of the accessed vessel. A guidewire was advanced to the level of the superior caval-atrial junction for measurement purposes and the PICC line was cut to length. A peel-away sheath was placed and a 32 cm, 5 [place], single lumen was inserted to level of the superior caval-atrial junction. A post procedure spot fluoroscopic was obtained. The catheter easily aspirated and flushed and was secured in place. A dressing was placed. The patient tolerated the procedure well without immediate post procedural complication. FINDINGS: After catheter placement, the tip lies within the superior cavoatrial junction. The catheter aspirates and flushes normally and is ready for immediate use. IMPRESSION: Successful ultrasound and fluoroscopic guided placement of a right brachial vein approach, 32  cm, 5 French, single lumen PICC with tip at the superior caval-atrial junction. The PICC line is ready for immediate use. Performed by: Lawernce Ion, PA-C Electronically Signed   By: Irish Lack M.D.   On: 04/22/2023 15:00   US Venous Img Lower Unilateral Left (DVT) Result Date: 04/22/2023 CLINICAL DATA:  Persistent left leg swelling in lower leg infection. EXAM: LEFT LOWER EXTREMITY VENOUS DOPPLER ULTRASOUND  TECHNIQUE: Gray-scale sonography with compression, as well as color and duplex ultrasound, were performed to evaluate the deep venous system(s) from the level of the common femoral vein through the popliteal and proximal calf veins. COMPARISON:  None Available. FINDINGS: VENOUS Normal compressibility of the common femoral, superficial femoral, and popliteal veins, as well as the visualized calf veins. Visualized portions of profunda femoral vein and great saphenous vein unremarkable. No filling defects to suggest DVT on grayscale or color Doppler imaging. Doppler waveforms show normal direction of venous flow, normal respiratory plasticity and response to augmentation. Limited views of the contralateral common femoral vein are unremarkable. OTHER None. Limitations: None. IMPRESSION: Negative for DVT.  Negative exam. Electronically Signed   By: Drusilla Kanner M.D.   On: 04/22/2023 13:01    Procedures Procedures    Medications Ordered in ED Medications - No data to display  ED Course/ Medical Decision Making/ A&P                                 Medical Decision Making Amount and/or Complexity of Data Reviewed Labs: ordered. Radiology: ordered.   Patient presents with palpitations.  She has a history of palpitations.  Lab work is unremarkable.  X-ray shows PICC line is appropriately placed, tip of the catheter is not in the heart.  No identified cause for her palpitations.  She reports follow-up with cardiology EP on February 19 for the symptoms.  No further intervention required  today.        Final Clinical Impression(s) / ED Diagnoses Final diagnoses:  Palpitations    Rx / DC Orders ED Discharge Orders     None         Jenesis Martin, Canary Brim, MD 04/23/23 0230

## 2023-04-24 ENCOUNTER — Ambulatory Visit: Payer: Self-pay | Admitting: Family Medicine

## 2023-04-24 ENCOUNTER — Other Ambulatory Visit: Payer: Self-pay | Admitting: Family Medicine

## 2023-04-24 DIAGNOSIS — G894 Chronic pain syndrome: Secondary | ICD-10-CM

## 2023-04-24 MED ORDER — EPINEPHRINE 0.3 MG/0.3ML IJ SOAJ: 0.3000 mg | Freq: Once | INTRAMUSCULAR | 0 refills | Status: AC

## 2023-04-24 MED ORDER — CYANOCOBALAMIN 1000 MCG/ML IJ SOLN: INTRAMUSCULAR | 6 refills | Status: DC

## 2023-04-24 MED ORDER — NORTREL 0.5/35 (28) 0.5-35 MG-MCG PO TABS: 1.0000 | ORAL_TABLET | Freq: Every day | ORAL | 11 refills | Status: DC

## 2023-04-24 MED ORDER — CLOPIDOGREL BISULFATE 75 MG PO TABS: 75.0000 mg | ORAL_TABLET | Freq: Every day | ORAL | 1 refills | Status: DC

## 2023-04-24 MED ORDER — GABAPENTIN 400 MG PO CAPS: 400.0000 mg | ORAL_CAPSULE | Freq: Two times a day (BID) | ORAL | 2 refills | Status: DC

## 2023-04-24 MED ORDER — BACLOFEN 20 MG PO TABS: 20.0000 mg | ORAL_TABLET | ORAL | 1 refills | Status: DC | PRN

## 2023-04-24 MED ORDER — ONDANSETRON 8 MG PO TBDP: 8.0000 mg | ORAL_TABLET | ORAL | 2 refills | Status: DC | PRN

## 2023-04-24 MED ORDER — OLMESARTAN MEDOXOMIL 20 MG PO TABS: 20.0000 mg | ORAL_TABLET | Freq: Every day | ORAL | 1 refills | Status: DC

## 2023-04-24 MED ORDER — BREO ELLIPTA 100-25 MCG/ACT IN AEPB: 1.0000 | INHALATION_SPRAY | Freq: Every day | RESPIRATORY_TRACT | 6 refills | Status: DC

## 2023-04-24 MED ORDER — ALBUTEROL SULFATE HFA 108 (90 BASE) MCG/ACT IN AERS: 2.0000 | INHALATION_SPRAY | Freq: Four times a day (QID) | RESPIRATORY_TRACT | 2 refills | Status: DC | PRN

## 2023-04-24 NOTE — Telephone Encounter (Signed)
Pt needs refills on her prescriptions. Speaking with pt friend, Alfredia Client. Pt was speaking with Lubertha Basque but got disconnected. Pt friend gave correct phone number for landline. Huntley Dec RN calling back.  Copied from CRM [id number]. Topic: Clinical - Prescription Issue >> Apr 24, 2023  9:35 AM Clayton Bibles wrote: Reason for CRM: I sent her to triage nurse because doctor wants her to come in for an appointment and it is hard for her to transport with her new PICC Line

## 2023-04-24 NOTE — Telephone Encounter (Addendum)
Pt calls today d/t being out of her medications. Pt was seen by Neale Burly in the office on 1/22 to establish care as a new patient. Pt states that Jerrel Ivory told the pt that once pt is out of her medications, Jerrel Ivory will refill them. Pt states she received a VM from the office today that in order for her meds to be refilled, she would have to come in for another office visit. Pt very frustrated because she states she was there on 1/22 and states she discussed every single medicine with Jerrel Ivory at that time.  Pt had a PICC line placed on 2/11 and is currently receiving a 24-hr IV antibiotic. Pt very stressed about coming into the office while attached to an IV medication.  Pt states she needs everything refilled except for prednisone and metoprolol (not taking anymore because her BP has been low). Pt has not had her blood thinner since Monday, very concerned about a blood clot. Pt has all the antibiotics she needs. RN spoke to CAL. CAL reviewed the notes and said that it appears meds for acute needs were discussed, and the pt received a VM to come back to address chronic med needs. RN lost call with the pt, tried to call back, busy signal. Called again and left a message.   RN called pt on her landline to communicate this information. Pt and family member extremely frustrated.  Answer Assessment - Initial Assessment Questions 1. DRUG NAME: "What medicine do you need to have refilled?"     Everything except for the prednisone and "blood pressure medicine she is not taking anymore" (Metoprolol). Pt taking 24 IV antibiotic w/ PICC line plus 3 PO antibiotics as well that she does not need refilled. She has these.  2. REFILLS REMAINING: "How many refills are remaining?" (Note: The label on the medicine or pill bottle will show how many refills are remaining. If there are no refills remaining, then a renewal may be needed.)     None - pt out of meds. Pt has not had blood thinner since Monday, for  example. 4. PRESCRIBING HCP: "Who prescribed it?" Reason: If prescribed by specialist, call should be referred to that group.     Prior PCP prescribed the meds, Jerrel Ivory new PCP told pt she would refill the meds once pt ran out. 5. SYMPTOMS: "Do you have any symptoms?"     "Right now just my stomach hurts but they said that's a side effect of the antibiotic and a little bit of itching."  CAL says HTN meds were not discussed at the office visit, only "acute things." CAL told pt they would have to come back in.  Protocols used: Medication Refill and Renewal Call-A-AH

## 2023-04-24 NOTE — Telephone Encounter (Signed)
Called pt and refilled medications

## 2023-04-24 NOTE — Telephone Encounter (Signed)
Reason for Disposition . [1] Prescription refill request for ESSENTIAL medicine (i.e., likelihood of harm to patient if not taken) AND [2] triager unable to refill per department policy  Answer Assessment - Initial Assessment Questions 1. DRUG NAME: "What medicine do you need to have refilled?"     Everything except for the prednisone and "blood pressure medicine she is not taking anymore" (Metoprolol). Pt taking 24 IV antibiotic w/ PICC line plus 3 PO antibiotics as well that she does not need refilled. She has these.  2. REFILLS REMAINING: "How many refills are remaining?" (Note: The label on the medicine or pill bottle will show how many refills are remaining. If there are no refills remaining, then a renewal may be needed.)     None - pt out of meds. Pt has not had blood thinner since Monday, for example. 4. PRESCRIBING HCP: "Who prescribed it?" Reason: If prescribed by specialist, call should be referred to that group.     Prior PCP prescribed the meds, Jerrel Ivory new PCP told pt she would refill the meds once pt ran out. 5. SYMPTOMS: "Do you have any symptoms?"     "Right now just my stomach hurts but they said that's a side effect of the antibiotic and a little bit of itching."  CAL says HTN meds were not discussed at the office visit, only "acute things." CAL told pt they would have to come back in.  Protocols used: Medication Refill and Renewal Call-A-AH

## 2023-04-25 NOTE — Telephone Encounter (Signed)
I would have to look into  Is she consistently taking PO vit D 50,000 weekly? Insurance would be a barrier Anything else going on? Comorbidities?

## 2023-04-30 ENCOUNTER — Telehealth: Payer: Self-pay

## 2023-04-30 ENCOUNTER — Ambulatory Visit: Payer: Medicare Other | Admitting: Internal Medicine

## 2023-04-30 NOTE — Telephone Encounter (Signed)
Patient called requesting refill on percocet.  Per Dr.Vu patient will need to contact PCP regarding pain medication refill. Provider informed patient this at last office visit.    Patient stated that her PCP doesn't prescribe any controlled substances. Informed patient she may need to see what other options PCP recommends.    Bradlee Heitman Lesli Albee, CMA

## 2023-05-01 ENCOUNTER — Ambulatory Visit: Payer: Medicare Other | Admitting: Internal Medicine

## 2023-05-02 NOTE — Telephone Encounter (Signed)
Patient aware of plan regarding vitamin D tx

## 2023-05-02 NOTE — Addendum Note (Signed)
Addended by: Neale Burly on: 05/02/2023 03:55 PM   Modules accepted: Orders

## 2023-05-02 NOTE — Telephone Encounter (Signed)
PCP, Neale Burly, FNP has placed a referral for pain mgmt. At this point she is not willing to provide a bridge script.

## 2023-05-05 ENCOUNTER — Encounter: Payer: Self-pay | Admitting: Internal Medicine

## 2023-05-05 ENCOUNTER — Ambulatory Visit: Payer: Medicare Other | Attending: Internal Medicine | Admitting: Internal Medicine

## 2023-05-05 VITALS — BP 175/97 | HR 78 | Ht 60.0 in | Wt 119.4 lb

## 2023-05-05 DIAGNOSIS — R000 Tachycardia, unspecified: Secondary | ICD-10-CM | POA: Diagnosis not present

## 2023-05-05 DIAGNOSIS — G909 Disorder of the autonomic nervous system, unspecified: Secondary | ICD-10-CM | POA: Diagnosis present

## 2023-05-05 DIAGNOSIS — R002 Palpitations: Secondary | ICD-10-CM | POA: Insufficient documentation
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
## 2023-05-07 LAB — LAB REPORT - SCANNED
Calcium: 9.1
EGFR: 124

## 2023-05-08 ENCOUNTER — Telehealth: Payer: Self-pay | Admitting: *Deleted

## 2023-05-08 NOTE — Telephone Encounter (Signed)
 Seen by [name] w/ [facility] Pt has been trying to get in touch w/ PCP about her appt on Monday w/ Dr. Graciela Husbands

## 2023-05-09 ENCOUNTER — Emergency Department (HOSPITAL_COMMUNITY)
Admission: EM | Admit: 2023-05-09 | Discharge: 2023-05-09 | Disposition: A | Discharge status: Home / Self Care | Payer: Medicare Other | Attending: Emergency Medicine | Admitting: Emergency Medicine

## 2023-05-09 ENCOUNTER — Emergency Department (HOSPITAL_COMMUNITY): Payer: Medicare Other

## 2023-05-09 ENCOUNTER — Other Ambulatory Visit: Payer: Self-pay

## 2023-05-09 ENCOUNTER — Encounter (HOSPITAL_COMMUNITY): Payer: Self-pay

## 2023-05-09 DIAGNOSIS — T82898A Other specified complication of vascular prosthetic devices, implants and grafts, initial encounter: Secondary | ICD-10-CM | POA: Insufficient documentation

## 2023-05-09 DIAGNOSIS — R112 Nausea with vomiting, unspecified: Secondary | ICD-10-CM | POA: Diagnosis not present

## 2023-05-09 DIAGNOSIS — Z452 Encounter for adjustment and management of vascular access device: Secondary | ICD-10-CM | POA: Insufficient documentation

## 2023-05-09 LAB — CBC WITH DIFFERENTIAL/PLATELET
Abs Immature Granulocytes: 0.03 10*3/uL (ref 0.00–0.07)
Basophils Absolute: 0.1 10*3/uL (ref 0.0–0.1)
Basophils Relative: 1 %
Eosinophils Absolute: 0.1 10*3/uL (ref 0.0–0.5)
Eosinophils Relative: 1 %
HCT: 37.6 % (ref 36.0–46.0)
Hemoglobin: 12.2 g/dL (ref 12.0–15.0)
Immature Granulocytes: 0 %
Lymphocytes Relative: 28 %
Lymphs Abs: 2.4 10*3/uL (ref 0.7–4.0)
MCH: 30.1 pg (ref 26.0–34.0)
MCHC: 32.4 g/dL (ref 30.0–36.0)
MCV: 92.8 fL (ref 80.0–100.0)
Monocytes Absolute: 0.7 10*3/uL (ref 0.1–1.0)
Monocytes Relative: 8 %
Neutro Abs: 5.4 10*3/uL (ref 1.7–7.7)
Neutrophils Relative %: 62 %
Platelets: 320 10*3/uL (ref 150–400)
RBC: 4.05 MIL/uL (ref 3.87–5.11)
RDW: 11.9 % (ref 11.5–15.5)
WBC: 8.7 10*3/uL (ref 4.0–10.5)
nRBC: 0 % (ref 0.0–0.2)

## 2023-05-09 LAB — COMPREHENSIVE METABOLIC PANEL
ALT: 11 U/L (ref 0–44)
AST: 12 U/L — ABNORMAL LOW (ref 15–41)
Albumin: 3.7 g/dL (ref 3.5–5.0)
Alkaline Phosphatase: 36 U/L — ABNORMAL LOW (ref 38–126)
Anion gap: 10 (ref 5–15)
BUN: 15 mg/dL (ref 6–20)
CO2: 25 mmol/L (ref 22–32)
Calcium: 9.3 mg/dL (ref 8.9–10.3)
Chloride: 102 mmol/L (ref 98–111)
Creatinine, Ser: 0.68 mg/dL (ref 0.44–1.00)
GFR, Estimated: >60 mL/min (ref >60)
Glucose, Bld: 93 mg/dL (ref 70–99)
Potassium: 3.6 mmol/L (ref 3.5–5.1)
Sodium: 137 mmol/L (ref 135–145)
Total Bilirubin: 0.3 mg/dL (ref 0.0–1.2)
Total Protein: 7.3 g/dL (ref 6.5–8.1)

## 2023-05-09 LAB — LIPASE, BLOOD: Lipase: 30 U/L (ref 11–51)

## 2023-05-09 MED ORDER — METOCLOPRAMIDE HCL 5 MG/ML IJ SOLN
10.0000 mg | Freq: Once | INTRAMUSCULAR | Status: AC
  Administered 2023-05-09: 10 mg via INTRAVENOUS
  Filled 2023-05-09: qty 2

## 2023-05-09 MED ORDER — SODIUM CHLORIDE 0.9% FLUSH: 10.0000 mL | Freq: Two times a day (BID) | INTRAVENOUS | Status: DC

## 2023-05-09 MED ORDER — CHLORHEXIDINE GLUCONATE CLOTH 2 % EX PADS
6.0000 | MEDICATED_PAD | Freq: Every day | CUTANEOUS | Status: DC
Start: 2023-05-10 — End: 2023-05-10

## 2023-05-09 MED ORDER — SODIUM CHLORIDE 0.9 % IV BOLUS
1000.0000 mL | Freq: Once | INTRAVENOUS | Status: AC
  Administered 2023-05-09: 1000 mL via INTRAVENOUS

## 2023-05-09 MED ORDER — ALTEPLASE 2 MG IJ SOLR
2.0000 mg | Freq: Once | INTRAMUSCULAR | Status: AC
  Administered 2023-05-09: 2 mg
  Filled 2023-05-09 (×2): qty 2

## 2023-05-09 MED ORDER — SODIUM CHLORIDE 0.9% FLUSH: 10.0000 mL | INTRAVENOUS | Status: DC | PRN

## 2023-05-09 NOTE — Progress Notes (Signed)
 Peripherally Inserted Central Catheter Placement  The IV Nurse has discussed with the patient and/or persons authorized to consent for the patient, the purpose of this procedure and the potential benefits and risks involved with this procedure.  The benefits include less needle sticks, lab draws from the catheter, and the patient may be discharged home with the catheter. Risks include, but not limited to, infection, bleeding, blood clot (thrombus formation), and puncture of an artery; nerve damage and irregular heartbeat and possibility to perform a PICC exchange if needed/ordered by physician.  Alternatives to this procedure were also discussed.  Bard Power PICC patient education guide, fact sheet on infection prevention and patient information card has been provided to patient /or left at bedside.    PICC Placement Documentation  PICC Single Lumen 04/22/23 Right Basilic 32 cm (Active)  Site Assessment Clean, Dry, Intact 04/22/23 1450  Line Status Flushed;Dead end cap in place;Heparin locked;Blood return noted 04/22/23 1450  Dressing Type Transparent 04/22/23 1450  Dressing Status Antimicrobial disc/dressing in place;Clean, Dry, Intact 04/22/23 1450     PICC Double Lumen 05/09/23 Left Basilic 39 cm 0 cm (Active)  Indication for Insertion or Continuance of Line Prolonged intravenous therapies;Home intravenous therapies (PICC only) 05/09/23 2236  Exposed Catheter (cm) 0 cm 05/09/23 2236  Site Assessment Clean, Dry, Intact 05/09/23 2236  Lumen #1 Status Flushed;Saline locked;Blood return noted 05/09/23 2236  Lumen #2 Status Flushed;Saline locked;Blood return noted 05/09/23 2236  Dressing Type Transparent;Securing device 05/09/23 2236  Dressing Status Antimicrobial disc/dressing in place;Clean, Dry, Intact 05/09/23 2236  Line Care Connections checked and tightened 05/09/23 2236  Line Adjustment (NICU/IV Team Only) No 05/09/23 2236  Dressing Intervention New dressing;Adhesive placed at insertion  site (IV team only) 05/09/23 2236  Dressing Change Due 05/16/23 05/09/23 2236       Serapio Edelson, Lajean Manes 05/09/2023, 10:37 PM

## 2023-05-09 NOTE — Telephone Encounter (Signed)
 TC to [name] w/ [facility] Pt has 24 hr IV antibiotic now with 1 picc line Does she need IV hydration? How often? And if so will need another line. Please advise

## 2023-05-09 NOTE — Telephone Encounter (Signed)
 Spoke to patient. She needs a HH order for IV hydration. If [facility] does not do this then any alternatives.

## 2023-05-09 NOTE — ED Notes (Signed)
 Attempted cath flow for 3rd, continuing to meet resistance/unable to flush. MD and PA Aware. PICC Team has been contacted, patient aware.

## 2023-05-09 NOTE — Telephone Encounter (Signed)
 Milian, Aleen Campi, FNP  You; Fredrich Romans Clinical30 minutes ago (2:28 PM)   Patient is receiving IV abx from infectious disease. I have not prescribed IV hydration and would reach out to ID for orders.  Pt currently at the ED but is aware of the above recommendation.

## 2023-05-09 NOTE — ED Notes (Signed)
 Access nurse arrived

## 2023-05-09 NOTE — Progress Notes (Signed)
 Right PICC removed per order.  Vaseline and folded 4x4 dressing applied with manual pressure held x 5 minutes, no s/s bleeding post removal.  Patient aware of the need to remain lying in bed for 30 minutes post removal.  Patient instructed via teach back to keep dressing clean dry and intact.  Remove dressing in 24 to 48 hours and what to do if bleeding occurs.

## 2023-05-09 NOTE — ED Notes (Signed)
 Went into pt's room to administer cath flo, pt had long extension that was hooked to picc line, clamp was open on picc line and dark red blood extended from end of picc line to insertion site. Unable to determine length of picc that was exposed due to taping, pt did not want dressing removed, cxr verified proper placement of picc line, RN took off the extension line that was clear, changed to new cap and attempted to draw back blood with small amount of pink tinged fluid drawn back. Resistance was met with attempting to flush line, syringe with cath flo was placed on picc line and was left for 30 minutes. Pt expressed understanding.

## 2023-05-09 NOTE — ED Provider Notes (Signed)
 [place] EMERGENCY DEPARTMENT AT [facility] Provider Note   CSN: [id number] Arrival date & time: 05/09/23  1337     History  Chief Complaint  Patient presents with   Vascular Access Problem    Traci Hensley is a 33 y.o. female who is currently being treated for a staph infection in her left lower leg under the care of Dr. Renold Don of infectious disease, she is currently on oral Keflex, Bactrim and Zyvox and is also receiving IV cefoxitin 12 gram daily continuous infusion through a PICC line.  Her line has clotted,  stating she had to wait several hours when the cefoxitin first came out of the fridge and the line was clotted despite using heparin between changing bags.  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    7:54 PM At re-exam,  pt has received alteplase in the PICC line,  attempts to flush not successful.  Pt also states she has been nauseated, not able to keep any PO fluids down for the past week, blames on the antibiotics.  Denies fevers,  abdominal pain, diarrhea.  Feels dehydrated.  Has a history of gastroparesis,  currently has zofran which is not helping,  reglan used to be a better medicine for her.     The history is provided by the patient.       Home Medications Prior to Admission medications   Medication Sig Start Date End Date Taking? Authorizing Provider  acetaminophen (TYLENOL) 500 MG tablet Take 2,000-2,500 mg by mouth every 6 (six) hours as needed for mild pain (pain score 1-3).    [provider]  albuterol (VENTOLIN HFA) 108 (90 Base) MCG/ACT inhaler Inhale 2 puffs into the lungs every 6 (six) hours as needed for wheezing or shortness of breath. 04/24/23   Milian, Aleen Campi, FNP  azithromycin (ZITHROMAX) 250 MG tablet Take 1 tablet (250 mg total) by mouth daily. 04/21/23 10/18/23  Vu, Tonita Phoenix, MD  baclofen (LIORESAL) 20 MG tablet Take 1 tablet (20 mg total) by mouth as needed for muscle spasms.  04/24/23   Milian, Aleen Campi, FNP  BREO ELLIPTA 100-25 MCG/ACT AEPB Inhale 1 puff into the lungs daily. 04/24/23   MilianAleen Campi, FNP  clopidogrel (PLAVIX) 75 MG tablet Take 1 tablet (75 mg total) by mouth daily. 04/24/23   Milian, Aleen Campi, FNP  cyanocobalamin (VITAMIN B12) 1000 MCG/ML injection INJECT 1 ML INTRAMUSCULARLY EVERY OTHER WEEK 04/24/23   Milian, Aleen Campi, FNP  ergocalciferol (VITAMIN D2) 1.25 MG (50000 UT) capsule Take 50,000 Units by mouth once a week.    [provider]  gabapentin (NEURONTIN) 400 MG capsule Take 1 capsule (400 mg total) by mouth 2 (two) times daily. 04/24/23   Milian, Aleen Campi, FNP  ibuprofen (ADVIL) 200 MG tablet Take 1,600 mg by mouth every 6 (six) hours as needed for mild pain (pain score 1-3).    [provider]  linezolid (ZYVOX) 600 MG tablet Take 1 tablet (600 mg total) by mouth 2 (two) times daily. 04/21/23 05/21/23  Vu, Tonita Phoenix, MD  mupirocin ointment (BACTROBAN) 2 % Apply to skin rash on leg/ankle once daily. 03/03/23   McCaughan, Dia D, DPM  nitroGLYCERIN (NITROSTAT) 0.4 MG SL tablet Place 1 tablet (0.4 mg total) under the tongue every 5 (five) minutes as needed for chest pain. 06/17/14   Jake Bathe, MD  NONFORMULARY OR COMPOUNDED ITEM Apply 1 application  topically 4 (four)  times daily. Katamine/Gabapentin/Baclofen/Lidocaine/Menthol Cream    [provider]  norethindrone-ethinyl estradiol (NORTREL 0.5/35, 28,) 0.5-35 MG-MCG tablet Take 1 tablet by mouth daily. 04/24/23   Milian, Aleen Campi, FNP  olmesartan (BENICAR) 20 MG tablet Take 1 tablet (20 mg total) by mouth daily. 04/24/23   Milian, Aleen Campi, FNP  ondansetron (ZOFRAN-ODT) 8 MG disintegrating tablet Take 1 tablet (8 mg total) by mouth as needed for nausea or vomiting. 04/24/23   Milian, Aleen Campi, FNP  predniSONE (DELTASONE) 5 MG tablet Take 1 tablet (5 mg total) by mouth daily. 04/02/23   Milian, Aleen Campi, FNP   sulfamethoxazole-trimethoprim (BACTRIM DS) 800-160 MG tablet Take 1 tablet by mouth 2 (two) times daily. 04/21/23 10/18/23  Vu, Gershon Mussel T, MD  triamcinolone cream (KENALOG) 0.1 % Apply 1 Application topically daily. 03/17/23   [provider]      Allergies    Adhesive [tape], Blue dyes (parenteral), Contrast media [iodinated contrast media], Corn-containing products, Green dyes, Nickel, Omnipaque [iohexol], Onion, Other, Petroleum distillate, Silicone, Bee venom, Cortisporin-tc [neomycin-colist-hc-thonzonium], and Yellow jacket venom    Review of Systems   Review of Systems  Constitutional:  Negative for fever.  HENT:  Negative for congestion and sore throat.   Eyes: Negative.   Respiratory:  Negative for chest tightness and shortness of breath.   Cardiovascular:  Negative for chest pain.  Gastrointestinal:  Positive for nausea and vomiting. Negative for abdominal pain.  Genitourinary: Negative.   Musculoskeletal:  Negative for arthralgias, joint swelling and neck pain.  Skin:  Positive for wound. Negative for rash.       Chronic left lower leg skin infection  Neurological:  Positive for weakness. Negative for dizziness, light-headedness, numbness and headaches.  Psychiatric/Behavioral: Negative.      Physical Exam Updated Vital Signs BP (!) 146/86   Pulse 82   Temp 98.9 F (37.2 C) (Oral)   Ht 5' (1.524 m)   Wt 54 kg   SpO2 99%   BMI 23.24 kg/m  Physical Exam Vitals and nursing note reviewed.  Constitutional:      Appearance: She is well-developed.  HENT:     Head: Normocephalic and atraumatic.  Eyes:     Conjunctiva/sclera: Conjunctivae normal.  Cardiovascular:     Rate and Rhythm: Normal rate and regular rhythm.     Heart sounds: Normal heart sounds.  Pulmonary:     Effort: Pulmonary effort is normal.     Breath sounds: Normal breath sounds. No wheezing.  Abdominal:     General: Bowel sounds are normal. There is no distension.     Palpations: Abdomen is soft.      Tenderness: There is abdominal tenderness in the epigastric area. There is no guarding.     Comments: Mild tenderness to epigastric region, no guarding or rebound.  Musculoskeletal:        General: Normal range of motion.     Cervical back: Normal range of motion.  Skin:    General: Skin is warm and dry.     Coloration: Skin is pale.  Neurological:     General: No focal deficit present.     Mental Status: She is alert.     ED Results / Procedures / Treatments   Labs (all labs ordered are listed, but only abnormal results are displayed) Labs Reviewed  CBC WITH DIFFERENTIAL/PLATELET  COMPREHENSIVE METABOLIC PANEL  LIPASE, BLOOD  URINALYSIS, ROUTINE W REFLEX MICROSCOPIC  POC URINE PREG, ED    EKG None  Radiology DG  Chest Portable 1 View Result Date: 05/09/2023 CLINICAL DATA:  PICC line EXAM: PORTABLE CHEST 1 VIEW COMPARISON:  04/22/2023 FINDINGS: Right upper extremity central venous catheter tip at the SVC. Lung fields are clear. Cardiac size is IMPRESSION: Right upper extremity PICC tip at the SVC. Clear lung fields Electronically Signed   By: Jasmine Pang M.D.   On: 05/09/2023 17:22    Procedures Procedures    Medications Ordered in ED Medications  sodium chloride 0.9 % bolus 1,000 mL (has no administration in time range)  metoCLOPramide (REGLAN) injection 10 mg (has no administration in time range)  alteplase (CATHFLO ACTIVASE) injection 2 mg (2 mg Intracatheter Given 05/09/23 1818)    ED Course/ Medical Decision Making/ A&P                                 Medical Decision Making Pt obtaining home IV Cefoxitin for left lower extremity skin infection,  PICC line clogged and cathflow tx not effective.  Also with complaint of nausea and vomiting x 1 week since being on multiple abx (also on oral keflex, bactrim and zyvox.    Discussed with PICC line team regarding exchange,  they can only exchange a line they placed.  This was placed by IR.  Discussed with Dr.  Archer Asa with IR,  they do not do IR PICC's on the weekend except as emergent, then requires calling in the whole team.   Pending labs given nausea/vomiting to rule out dehydration or other source of n/v.     Dr. Charm Barges assumes care of pt.   Amount and/or Complexity of Data Reviewed Labs: ordered.    Details: Pending.  Radiology: ordered.    Details: Portable chest,  PICC line placement ok  Risk Prescription drug management.           Final Clinical Impression(s) / ED Diagnoses Final diagnoses:  None    Rx / DC Orders ED Discharge Orders     None         Victoriano Lain 05/09/23 1954    Terrilee Files, MD 05/10/23 1115

## 2023-05-09 NOTE — ED Triage Notes (Signed)
 Pt states she went to administer antibiotics through her PICC line in right arm but when she went to flush the PICC it was blocked.

## 2023-05-09 NOTE — Discharge Instructions (Signed)
 You are seen in the emergency department for a nonfunctioning PICC line.  We were able to place another PICC line and appears to be working correctly.  Your lab work was unremarkable.  Please continue your antibiotics and follow-up with your treatment team.

## 2023-05-09 NOTE — ED Notes (Signed)
 Attempted Cath flow for recommended 30 min second attempt, unable to flush. Charm Barges MD and Idol PA both made aware and advise to attempt again in 30 min. Pt aware and agrees.

## 2023-05-13 ENCOUNTER — Encounter: Payer: Self-pay | Admitting: Infectious Disease

## 2023-05-13 DIAGNOSIS — L989 Disorder of the skin and subcutaneous tissue, unspecified: Secondary | ICD-10-CM

## 2023-05-13 HISTORY — DX: Disorder of the skin and subcutaneous tissue, unspecified: L98.9

## 2023-05-13 LAB — MYCOBACTERIA,CULT W/FLUOROCHROME SMEAR
MICRO NUMBER:: 15976646
SMEAR:: NONE SEEN
SPECIMEN QUALITY:: ADEQUATE

## 2023-05-13 LAB — FUNGUS CULTURE W SMEAR
CULTURE:: NO GROWTH
MICRO NUMBER:: 15968202
SMEAR:: NONE SEEN
SPECIMEN QUALITY:: ADEQUATE

## 2023-05-13 NOTE — Progress Notes (Unsigned)
 Subjective:  Chief complaint: followup for leg wound   Patient ID: Traci Hensley, female    DOB: [birth year], 33 y.o.   MRN: [id number]  HPI   Traci Hensley is a 33 y.o. female hx vertebral artery dissection and a chronic left leg wound referred to [facility] and followed by Dr. Renold Don  Below are his notes from last several visits:      "I reviewed epic charts 02/18/23 -- ed visit at [facility] for 3 week left leg wound -- patient doesn't recall how she got the wound. Had vertebral artery dissection a few months prior (10/2022); repeat brain mra 01/2023 showed healing right vertebral artery dissection and continued on aspirin with neurology f/u. Exam finding at that time described multiple abrasions noted to LLE and full thickness wound to lateral aspect that is open and very tender to touch given keflex and referred to wound clinic     12/17 and 03/03/23 podiatry visit -- reports topical abx ointment use as well. Not yet seen wound clinic. Telfa dressing/gauze wrap use. Reports several allergy to tape and petrolatum. Given hydrocortisone-zinc and doxycycline.     03/13/2023 id clinic initial visit Patient reports she was planting some mulberry bush at the time this happens. [place] farm with chickens/ducks/cats. There are dogs/cows on nearby properties Patient has fever off and on; last fever a few weeks as high as 107, but many readings are 101. She showed me several pictures. There is almost like a road burn and a clear blister on the distal lateral aspect of her leg. She has a tatoo there that has been there a long time. Then the rash spread circunferentially and upward over the next several weeks with some ulcerates/opens up. Yellowish thin fluid mostly for exudates She reports developing an allergy to doxy so had continued cephalexin --> finished today 20 days She used topical silvadene and bactroban at first when the initial wound open, but then the other lesion would occur at a  completely new area.     She is also on prednisone chronically 5 mg daily for her joint pain given by pcp. She had done dose packs at higher dose at most twice a year   03/27/23 id clinic f/u Patient appeared to have f/u'ed with wound care twice the last couple weeks A biopsy was not done as wound care was worried it could make her skin condition worse, but an wound swab was sent for bacteria pcr Labs all normal in terms of blood count, albumin/lft, sed rate/crp; bcx also was negative She is developing new lesions and also old worsening lesion today's visit No fever, chill            04/21/2023 id clinic visit Saw ed for pain, given cephalexin No improvement in rash, now macerating again and severe pain No f/c No n/v/diarrhea Crying Wants something done Not seeing derm yet     'll try to cover for nocardia/m-abscessus with the below regimen Bactrim ds 1 tab bid Azith 250 mg po daily Linezolid 600 mg po bid for 4 weeks then daily there after IV cefoxitin 12 gram continuous infusion"  Discussed the use of AI scribe software for clinical note transcription with the patient, who gave verbal consent to proceed.  History of Present Illness   The patient, with a history of a  vertebral artery aneurysm and a partially paralyzed stomach, presents with a long-standing wound on the lower leg. The wound had previously gotten infected  and was not according to the pateint treated in  timely manner the patient's first primary care provider. The patient sought care at an urgent care center where she was prescribed antibiotics (keflex) for a few days, which helped the wound to start closing. However, after discontinuing the antibiotics, the wound started to worsen again. The patient then saw a podiatrist who prescribed antibiotics for ten days. By the time the patient presented to the DrRenold Don  the wound was almost completely healed. However, after discontinuing the antibiotics, the wound worsened  significantly. The patient reports that the wound is painful and has been causing high blood pressure. The patient also reports nausea and vomiting, which has been exacerbated by the antibiotics. The patient has been on multiple antibiotics, including a sulfoxetine infusion, and reports that the wound has been improving with the current regimen. The patient also has a PICC line for antibiotic administration. As mentioned she has been on low dose prednisone and did scrap legs vs bushes in yard when original wound occurred. She is currently on a regimen of IV cefoxitin infusion, oral azithromycin, bactrim and zyvox. She had biopsies done by Dr. Renold Don that were unrevealing other than a Staph epidermidis. She had problems with PICC on the right which became infected and now has dual port placed on the left. she is having trouble with nausea and vomiting           Past Medical History:  Diagnosis Date   Agoraphobia    Anxiety    B12 deficiency anemia    Bipolar 1 disorder (HCC)    Chronic abdominal pain    Chronic chest pain    Depression    Dystonia    Unknown which type   PTSD (post-traumatic stress disorder)    Scoliosis    Vertebral artery dissection (HCC) 11/03/2022    Past Surgical History:  Procedure Laterality Date   MOUTH SURGERY      Family History  Problem Relation Age of Onset   Arrhythmia Mother    Colon cancer Neg Hx       Social History   Socioeconomic History   Marital status: Single    Spouse name: Not on file   Number of children: Not on file   Years of education: Not on file   Highest education level: 12th grade  Occupational History   Not on file  Tobacco Use   Smoking status: Some Days    Current packs/day: 0.00    Types: Cigarettes    Start date: 02/15/2012    Last attempt to quit: 02/14/2014    Years since quitting: 9.2   Smokeless tobacco: Never   Tobacco comments:    rare, social smoker  Vaping Use   Vaping status: Former  Substance and Sexual  Activity   Alcohol use: Not Currently   Drug use: No   Sexual activity: Not on file  Other Topics Concern   Not on file  Social History Narrative   Not on file   Social Drivers of Health   Financial Resource Strain: Medium Risk (03/29/2023)   Overall Financial Resource Strain (CARDIA)    Difficulty of Paying Living Expenses: Somewhat hard  Food Insecurity: Patient Declined (03/29/2023)   Hunger Vital Sign    Worried About Running Out of Food in the Last Year: Patient declined    Ran Out of Food in the Last Year: Patient declined  Transportation Needs: Patient Declined (03/29/2023)   PRAPARE - Transportation    Lack  of Transportation (Medical): Patient declined    Lack of Transportation (Non-Medical): Patient declined  Physical Activity: Unknown (03/29/2023)   Exercise Vital Sign    Days of Exercise per Week: Patient declined    Minutes of Exercise per Session: 20 min  Stress: Stress Concern Present (03/29/2023)   [employer] of Occupational Health - Occupational Stress Questionnaire    Feeling of Stress : To some extent  Social Connections: Unknown (03/29/2023)   Social Connection and Isolation Panel [NHANES]    Frequency of Communication with Friends and Family: Patient declined    Frequency of Social Gatherings with Friends and Family: Patient declined    Attends Religious Services: Patient declined    Database administrator or Organizations: Patient declined    Attends Engineer, structural: Not on file    Marital Status: Patient declined    Allergies  Allergen Reactions   Adhesive [Tape] Other (See Comments)    Eats skin almost instantly   Blue Dyes (Parenteral) Other (See Comments)    BLUE DYES IS OK BY ITSELF, BUT CANNOT BE COMBINED WITH GREEN DYES   Contrast Media [Iodinated Contrast Media] Nausea And Vomiting    Liquid version not the IV. Causes Vomiting up blood, shaking and tremors uncontrollable, dizziness and lightheadness   Corn-Containing Products  Anaphylaxis and Nausea And Vomiting    ALSO GRASS FAMILY PLANTS, VEGETABLES, ETC.    Green Dyes Other (See Comments)    GREEN DYE IS OK BY ITSELF, BUT CANNOT BE COMBINED WITH BLUE DYE   Nickel Itching, Swelling, Rash and Other (See Comments)   Omnipaque [Iohexol]     CT tech notes state "Patient allergy to IV dye"   Onion     Cannot be around onion at all or she has breathing difficulty   Other Nausea Only, Rash and Other (See Comments)    Sunlight causes extreme fatigue , Migraine , Cat Dander  Any type of anesthetic causes her to have prolonged sedation, requiring more than normal doses to reverse them   Petroleum Distillate Itching, Swelling and Rash   Silicone Hives, Swelling and Rash   Bee Venom Hives    Severe disorientation   Cortisporin-Tc [Neomycin-Colist-Hc-Thonzonium] Other (See Comments)    Pain, went deaf   Yellow Jacket Venom Hives    Severe disorientation     Current Outpatient Medications:    acetaminophen (TYLENOL) 500 MG tablet, Take 2,000-2,500 mg by mouth every 6 (six) hours as needed for mild pain (pain score 1-3)., Disp: , Rfl:    albuterol (VENTOLIN HFA) 108 (90 Base) MCG/ACT inhaler, Inhale 2 puffs into the lungs every 6 (six) hours as needed for wheezing or shortness of breath., Disp: 34 g, Rfl: 2   azithromycin (ZITHROMAX) 250 MG tablet, Take 1 tablet (250 mg total) by mouth daily., Disp: 30 tablet, Rfl: 5   baclofen (LIORESAL) 20 MG tablet, Take 1 tablet (20 mg total) by mouth as needed for muscle spasms., Disp: 30 each, Rfl: 1   BREO ELLIPTA 100-25 MCG/ACT AEPB, Inhale 1 puff into the lungs daily., Disp: 120 each, Rfl: 6   clopidogrel (PLAVIX) 75 MG tablet, Take 1 tablet (75 mg total) by mouth daily., Disp: 90 tablet, Rfl: 1   cyanocobalamin (VITAMIN B12) 1000 MCG/ML injection, INJECT 1 ML INTRAMUSCULARLY EVERY OTHER WEEK, Disp: 1 mL, Rfl: 6   ergocalciferol (VITAMIN D2) 1.25 MG (50000 UT) capsule, Take 50,000 Units by mouth once a week., Disp: , Rfl:     gabapentin (NEURONTIN)  400 MG capsule, Take 1 capsule (400 mg total) by mouth 2 (two) times daily., Disp: 60 capsule, Rfl: 2   ibuprofen (ADVIL) 200 MG tablet, Take 1,600 mg by mouth every 6 (six) hours as needed for mild pain (pain score 1-3)., Disp: , Rfl:    linezolid (ZYVOX) 600 MG tablet, Take 1 tablet (600 mg total) by mouth 2 (two) times daily., Disp: 60 tablet, Rfl: 0   mupirocin ointment (BACTROBAN) 2 %, Apply to skin rash on leg/ankle once daily., Disp: 22 g, Rfl: 1   nitroGLYCERIN (NITROSTAT) 0.4 MG SL tablet, Place 1 tablet (0.4 mg total) under the tongue every 5 (five) minutes as needed for chest pain., Disp: 25 tablet, Rfl: 12   NONFORMULARY OR COMPOUNDED ITEM, Apply 1 application  topically 4 (four) times daily. Katamine/Gabapentin/Baclofen/Lidocaine/Menthol Cream, Disp: , Rfl:    norethindrone-ethinyl estradiol (NORTREL 0.5/35, 28,) 0.5-35 MG-MCG tablet, Take 1 tablet by mouth daily., Disp: 28 tablet, Rfl: 11   olmesartan (BENICAR) 20 MG tablet, Take 1 tablet (20 mg total) by mouth daily., Disp: 90 tablet, Rfl: 1   ondansetron (ZOFRAN-ODT) 8 MG disintegrating tablet, Take 1 tablet (8 mg total) by mouth as needed for nausea or vomiting., Disp: 20 tablet, Rfl: 2   predniSONE (DELTASONE) 5 MG tablet, Take 1 tablet (5 mg total) by mouth daily., Disp: 30 tablet, Rfl: 4   sulfamethoxazole-trimethoprim (BACTRIM DS) 800-160 MG tablet, Take 1 tablet by mouth 2 (two) times daily., Disp: 60 tablet, Rfl: 5   triamcinolone cream (KENALOG) 0.1 %, Apply 1 Application topically daily., Disp: , Rfl:    Review of Systems  Constitutional:  Negative for activity change, appetite change, chills, diaphoresis, fatigue, fever and unexpected weight change.  HENT:  Negative for congestion, rhinorrhea, sinus pressure, sneezing, sore throat and trouble swallowing.   Eyes:  Negative for photophobia and visual disturbance.  Respiratory:  Negative for cough, chest tightness, shortness of breath, wheezing and  stridor.   Cardiovascular:  Negative for chest pain, palpitations and leg swelling.  Gastrointestinal:  Negative for abdominal distention, abdominal pain, anal bleeding, blood in stool, constipation, diarrhea, nausea and vomiting.  Genitourinary:  Negative for difficulty urinating, dysuria, flank pain and hematuria.  Musculoskeletal:  Positive for myalgias. Negative for arthralgias, back pain, gait problem and joint swelling.  Skin:  Positive for wound. Negative for color change and pallor.  Neurological:  Negative for dizziness, tremors, weakness and light-headedness.  Hematological:  Negative for adenopathy. Does not bruise/bleed easily.  Psychiatric/Behavioral:  Negative for agitation, behavioral problems, confusion, decreased concentration, dysphoric mood and sleep disturbance.        Objective:   Physical Exam Constitutional:      General: She is not in acute distress.    Appearance: Normal appearance. She is well-developed. She is not ill-appearing or diaphoretic.  HENT:     Head: Normocephalic and atraumatic.     Right Ear: Hearing and external ear normal.     Left Ear: Hearing and external ear normal.     Nose: No nasal deformity or rhinorrhea.  Eyes:     General: No scleral icterus.    Conjunctiva/sclera: Conjunctivae normal.     Right eye: Right conjunctiva is not injected.     Left eye: Left conjunctiva is not injected.     Pupils: Pupils are equal, round, and reactive to light.  Neck:     Vascular: No JVD.  Cardiovascular:     Rate and Rhythm: Normal rate and regular rhythm.  Heart sounds: Normal heart sounds, S1 normal and S2 normal. No murmur heard.    No friction rub.  Abdominal:     General: Bowel sounds are normal. There is no distension.     Palpations: Abdomen is soft.     Tenderness: There is no abdominal tenderness.  Musculoskeletal:        General: Normal range of motion.     Right shoulder: Normal.     Left shoulder: Normal.     Cervical back:  Normal range of motion and neck supple.     Right hip: Normal.     Left hip: Normal.     Right knee: Normal.     Left knee: Normal.  Lymphadenopathy:     Head:     Right side of head: No submandibular, preauricular or posterior auricular adenopathy.     Left side of head: No submandibular, preauricular or posterior auricular adenopathy.     Cervical: No cervical adenopathy.     Right cervical: No superficial or deep cervical adenopathy.    Left cervical: No superficial or deep cervical adenopathy.  Skin:    General: Skin is warm and dry.     Coloration: Skin is not pale.     Findings: No abrasion, bruising, ecchymosis, erythema, lesion or rash.     Nails: There is no clubbing.  Neurological:     Mental Status: She is alert and oriented to person, place, and time.     Sensory: No sensory deficit.     Coordination: Coordination normal.     Gait: Gait normal.  Psychiatric:        Attention and Perception: Attention normal. She is attentive.        Mood and Affect: Mood is anxious.        Speech: Speech normal.        Behavior: Behavior normal. Behavior is cooperative.        Thought Content: Thought content normal.        Cognition and Memory: Cognition and memory normal.        Judgment: Judgment normal.    Leg  03/13/2023           03/27/2023:                  04/17/2023:        2/102025:         05/14/2023:                Assessment & Plan:   Assessment and Plan    Chronic leg wound being treated presumptively for  nocardia or mycobacterial infection Chronic leg wound persists despite antibiotics. Cultures showed staph epidermidis. Chronic prednisone use increases risk for atypical infections. Current antibiotics target nocardia and mycobacterial species. Wound improving but remains tender and swollen.   NOTE there are several features of her case that I find unusual. She does not have any deep wounds on exam. She has  SIGNIFICANT pain that seems out of proportion to what i have seen with patients who have had CULTURE PROVEN Nocardia, NTM That being said she believes she is improving on the antibiotics - Continue Bactrim DS BD, azithromycin daily, and linezolid until May 15th, reduce linezolid to once daily after March 10th. - Monitor wound healing, adjust antibiotics as necessary. - Consider dermatology referral if no improvement.  Nausea and vomiting Nausea and vomiting exacerbated by antibiotics. Partial gastric paralysis limits Zofran effectiveness, impacting oral intake. Phenergan proposed for better efficacy. -  Prescribe Phenergan 25 mg for nausea before antibiotics or during severe episodes. - Administer fluid boluses twice a week.  Pain management Significant leg pain affects blood pressure and well-being. Pain management difficult due to controlled substance restrictions. Further imaging may be necessary if pain persists. - Prescribe short course of pain medication, no repeats. - Consider MRI if pain persists or worsens. --again the pain makes me wonder if this is a NON ID pathology  PICC line issues Current PICC line sore and draining but functioning. Antibiotics should cover potential infections. Two lumens facilitate administration due to poor oral intake. - Monitor PICC line for infection or increased drainage. - Ensure proper dressing and care. - Coordinate with home health for biopatch application and fluid administration.  Follow-up Ongoing monitoring required to assess treatment effectiveness. - Schedule follow-up appointment in May to reassess condition and treatment plan.      NOTE I HAVE NOW HAD A CHANCE TO REVIEW THE SERIAL IMAGES AND HER LEGS DO INDEED LOOK DRAMATICALLY IMPROVED WHICH RE-AFFIRMS WE ARE ON THE RIGHT PATH IN TERMS OF ANTIBIOTIC TREATMENT  THE PAIN is the odd feature here  I have personally spent 40 minutes involved in face-to-face and non-face-to-face activities  for this patient on the day of the visit. Professional time spent includes the following activities: Preparing to see the patient (review of tests), Obtaining and/or reviewing separately obtained history (admission/discharge record), Performing a medically appropriate examination and/or evaluation , Ordering medications/tests/procedures, referring and communicating with other health care professionals, Documenting clinical information in the EMR, Independently interpreting results (not separately reported), Communicating results to the patient/family/caregiver, Counseling and educating the patient/family/caregiver and Care coordination (not separately reported).

## 2023-05-14 ENCOUNTER — Ambulatory Visit (INDEPENDENT_AMBULATORY_CARE_PROVIDER_SITE_OTHER): Admitting: Family Medicine

## 2023-05-14 ENCOUNTER — Ambulatory Visit: Payer: Medicare Other | Admitting: Family Medicine

## 2023-05-14 ENCOUNTER — Telehealth: Payer: Self-pay

## 2023-05-14 ENCOUNTER — Other Ambulatory Visit: Payer: Self-pay

## 2023-05-14 ENCOUNTER — Ambulatory Visit: Payer: Self-pay | Admitting: Family Medicine

## 2023-05-14 ENCOUNTER — Ambulatory Visit (INDEPENDENT_AMBULATORY_CARE_PROVIDER_SITE_OTHER): Payer: Medicare Other | Admitting: Infectious Disease

## 2023-05-14 ENCOUNTER — Encounter: Payer: Self-pay | Admitting: Family Medicine

## 2023-05-14 VITALS — BP 109/65 | HR 85 | Temp 98.6°F | Ht 60.0 in | Wt 120.4 lb

## 2023-05-14 VITALS — BP 153/87 | HR 103 | Temp 98.7°F | Wt 122.4 lb

## 2023-05-14 DIAGNOSIS — R112 Nausea with vomiting, unspecified: Secondary | ICD-10-CM | POA: Diagnosis not present

## 2023-05-14 DIAGNOSIS — A319 Mycobacterial infection, unspecified: Secondary | ICD-10-CM

## 2023-05-14 DIAGNOSIS — K3184 Gastroparesis: Secondary | ICD-10-CM

## 2023-05-14 DIAGNOSIS — G909 Disorder of the autonomic nervous system, unspecified: Secondary | ICD-10-CM

## 2023-05-14 DIAGNOSIS — T380X5D Adverse effect of glucocorticoids and synthetic analogues, subsequent encounter: Secondary | ICD-10-CM | POA: Diagnosis not present

## 2023-05-14 DIAGNOSIS — Z840 Family history of diseases of the skin and subcutaneous tissue: Secondary | ICD-10-CM

## 2023-05-14 DIAGNOSIS — L989 Disorder of the skin and subcutaneous tissue, unspecified: Secondary | ICD-10-CM

## 2023-05-14 DIAGNOSIS — A439 Nocardiosis, unspecified: Secondary | ICD-10-CM

## 2023-05-14 DIAGNOSIS — M791 Myalgia, unspecified site: Secondary | ICD-10-CM

## 2023-05-14 MED ORDER — OXYCODONE-ACETAMINOPHEN 7.5-325 MG PO TABS: 1.0000 | ORAL_TABLET | Freq: Four times a day (QID) | ORAL | 0 refills | Status: AC | PRN

## 2023-05-14 MED ORDER — METOCLOPRAMIDE HCL 5 MG PO TABS: 5.0000 mg | ORAL_TABLET | Freq: Three times a day (TID) | ORAL | 0 refills | Status: DC

## 2023-05-14 MED ORDER — PROMETHAZINE HCL 25 MG PO TABS
25.0000 mg | ORAL_TABLET | Freq: Four times a day (QID) | ORAL | 3 refills | Status: DC | PRN
Start: 2023-05-14 — End: 2023-07-23

## 2023-05-14 NOTE — Telephone Encounter (Signed)
 Copied from CRM [id number]. Topic: Clinical - Medication Refill >> May 14, 2023 10:22 AM Mayer Masker wrote: Most Recent Primary Care Visit:  Provider: Neale Burly [facility]  Department: [facility]  Visit Type: OFFICE VISIT  Date: 04/02/2023  Medication: Metoclopramide  Has the patient contacted their pharmacy? No (Agent: If no, request that the patient contact the pharmacy for the refill. If patient does not wish to contact the pharmacy document the reason why and proceed with request.) (Agent: If yes, when and what did the pharmacy advise?)  Is this the correct pharmacy for this prescription? Yes If no, delete pharmacy and type the correct one.  This is the patient's preferred pharmacy:  [facility] [address] [place] - [address] [place] [address] [place] [address] Phone: [phone] Fax: [phone]   Has the prescription been filled recently? Yes  Is the patient out of the medication? Yes  Has the patient been seen for an appointment in the last year OR does the patient have an upcoming appointment? Yes  Can we respond through MyChart? Please call  Agent: Please be advised that Rx refills may take up to 3 business days. We ask that you follow-up with your pharmacy.  This is what was precribed from ER visit she had and was wonder if can be refilled   Chief Complaint: Medication Request Symptoms: Vomiting, Fever, Dry Mouth, Abdominal Pain Frequency: 2 Weeks Pertinent Negatives: Patient denies chest pain, dyspnea Disposition: [] ED /[] Urgent Care (no appt availability in office) / [x] Appointment(In office/virtual)/ []  [facility]/ [] Home Care/ [] Refused Recommended Disposition /[] [facility]/ []  Follow-up with PCP Additional Notes: Patient is requesting Reglan, but has been experiencing vomiting along with a fever, last known at 100.8 The patient also complains of abdominal pain. In addition the patient is requesting Reglan  that she states is not new to her and has taken it before. Made the patient an appointment in office for today.   Reason for Disposition  [1] MILD or MODERATE vomiting AND [2] present > 48 hours (2 days) (Exception: Mild vomiting with associated diarrhea.)  Answer Assessment - Initial Assessment Questions 1. VOMITING SEVERITY: "How many times have you vomited in the past 24 hours?"     - MILD:  1 - 2 times/day    - MODERATE: 3 - 5 times/day, decreased oral intake without significant weight loss or symptoms of dehydration    - SEVERE: 6 or more times/day, vomits everything or nearly everything, with significant weight loss, symptoms of dehydration      2  2. ONSET: "When did the vomiting begin?"      2 Weeks  3. FLUIDS: "What fluids or food have you vomited up today?" "Have you been able to keep any fluids down?"     No 4. ABDOMEN PAIN: "Are your having any abdomen pain?" If Yes : "How bad is it and what does it feel like?" (e.g., crampy, dull, intermittent, constant)      Center  5. DIARRHEA: "Is there any diarrhea?" If Yes, ask: "How many times today?"       Yes  6. CONTACTS: "Is there anyone else in the family with the same symptoms?"      No ] 7. CAUSE: "What do you think is causing your vomiting?"     Unsure  8. HYDRATION STATUS: "Any signs of dehydration?" (e.g., dry mouth [not only dry lips], too weak to stand) "When did you last urinate?"  Weakness, Dry Lips, Dry Mouths  9. OTHER SYMPTOMS: "Do you have any other symptoms?" (e.g., fever, headache, vertigo, vomiting blood or coffee grounds, recent head injury)     Arrhythmia  10. PREGNANCY: "Is there any chance you are pregnant?" "When was your last menstrual period?"       No, No Periods  Protocols used: Vomiting-A-AH

## 2023-05-14 NOTE — Telephone Encounter (Signed)
 Received call Pam with [facility] regarding conversation she had with home health. Home health nurse notes that patient has farm animals walking in and out of the house. Nursing staff concerned about home environment and IV antibiotics. Per Dr. Renold Don will need to move appt up to April.  Scheduling team will reach out to patient to move appt.  Juanita Laster, RMA

## 2023-05-14 NOTE — Progress Notes (Signed)
 Acute Office Visit  Subjective:     Patient ID: Traci Hensley, female    DOB: July 02, 1990, 33 y.o.   MRN: [id number]  Chief Complaint  Patient presents with   Emesis    Emesis    Patient is in today for nausea and vomiting. This has been an on going issue for her due to hx of gastroparesis. Symptoms have worsened since starting multiple abx by ID for a left lower leg infection. Has PICC line for access. Also reports diarrhea after eating and abdominal pain. This has also been ongoing since starting abx, unchanged. Hx of autonomic dysfunction with temperature dysregulation, tachycardia, and dizziness. She was seen in the ER on 05/09/23 for nausea and vomiting. Had unremarkable CMP and CBC. Vomiting has been occurring 1-2x a day if eating or drinking. Unchanged. Taking zofran without relief. Used to be on reglan in past with relief. Saw ID today. They have ordered home IV fluid for her PICC line 1-2x a week. Zofran and phenergan.   Review of Systems  Gastrointestinal:  Positive for vomiting.   As per HPI.      Objective:    BP 109/65   Pulse 85   Temp 98.6 F (37 C) (Temporal)   Ht 5' (1.524 m)   Wt 120 lb 6.4 oz (54.6 kg)   SpO2 98%   BMI 23.51 kg/m    Physical Exam Vitals and nursing note reviewed.  Constitutional:      General: She is not in acute distress.    Appearance: She is not toxic-appearing or diaphoretic.  HENT:     Mouth/Throat:     Mouth: Mucous membranes are moist.     Pharynx: Oropharynx is clear. No oropharyngeal exudate or posterior oropharyngeal erythema.  Cardiovascular:     Rate and Rhythm: Normal rate and regular rhythm.     Heart sounds: Normal heart sounds. No murmur heard. Pulmonary:     Effort: Pulmonary effort is normal.     Breath sounds: Normal breath sounds. No wheezing.  Abdominal:     General: Bowel sounds are normal. There is no distension.     Palpations: Abdomen is soft.     Tenderness: There is no abdominal tenderness. There  is no guarding or rebound.  Skin:    General: Skin is warm and dry.     Comments: Non-tenting  Neurological:     General: No focal deficit present.     Mental Status: She is alert and oriented to person, place, and time.  Psychiatric:        Mood and Affect: Mood normal.        Behavior: Behavior normal.     No results found for any visits on 05/14/23.      Assessment & Plan:   Mark was seen today for emesis.  Diagnoses and all orders for this visit:  Gastroparesis -     metoCLOPramide (REGLAN) 5 MG tablet; Take 1 tablet (5 mg total) by mouth 3 (three) times daily before meals.  Atypical mycobacterial infection  Autonomic dysfunction  Increased nausea and vomiting since starting abx for infection by ID. Failed zofran. Previously did well with reglan. Reglan discussed and ordered as above. Per patient, ID has ordered IV fluids for patient 1-2x a week through PICC line used for abx treatment. MMM and skin is non-tenting today. Reviewed reviewed recent ER note and labs.    Return if symptoms worsen or fail to improve.  Gabriel Earing, FNP

## 2023-05-16 ENCOUNTER — Telehealth: Payer: Self-pay

## 2023-05-16 ENCOUNTER — Ambulatory Visit (INDEPENDENT_AMBULATORY_CARE_PROVIDER_SITE_OTHER)

## 2023-05-16 DIAGNOSIS — I7774 Dissection of vertebral artery: Secondary | ICD-10-CM | POA: Diagnosis not present

## 2023-05-16 DIAGNOSIS — E440 Moderate protein-calorie malnutrition: Secondary | ICD-10-CM

## 2023-05-16 DIAGNOSIS — S81802D Unspecified open wound, left lower leg, subsequent encounter: Secondary | ICD-10-CM

## 2023-05-16 DIAGNOSIS — G894 Chronic pain syndrome: Secondary | ICD-10-CM

## 2023-05-16 DIAGNOSIS — L03116 Cellulitis of left lower limb: Secondary | ICD-10-CM

## 2023-05-16 DIAGNOSIS — R471 Dysarthria and anarthria: Secondary | ICD-10-CM

## 2023-05-16 DIAGNOSIS — F319 Bipolar disorder, unspecified: Secondary | ICD-10-CM | POA: Diagnosis not present

## 2023-05-16 DIAGNOSIS — F17210 Nicotine dependence, cigarettes, uncomplicated: Secondary | ICD-10-CM

## 2023-05-16 DIAGNOSIS — D519 Vitamin B12 deficiency anemia, unspecified: Secondary | ICD-10-CM

## 2023-05-16 DIAGNOSIS — R109 Unspecified abdominal pain: Secondary | ICD-10-CM

## 2023-05-16 DIAGNOSIS — F419 Anxiety disorder, unspecified: Secondary | ICD-10-CM

## 2023-05-16 DIAGNOSIS — R079 Chest pain, unspecified: Secondary | ICD-10-CM

## 2023-05-16 NOTE — Telephone Encounter (Signed)
 Received call from person identifying herself as Agam's mother and POA. She states they have not been able to get the IV NS for hydration and that they need additional NS flushes and heparin locks due to [place] having a double-lumen catheter.   Called Jeri Modena, RN with [employer]. She confirms they did not receive the orders for IV NS bolus. CMA faxed on 05/14/23. Re-faxed orders to both [employer] and Pam's e-fax.   Pam also states they have been in communication with patient and HH regarding use of flushes and heparin locks. Per Pam, she should be going through 2 NS flushes and one heparin lock per day.   Patient should be flushing lumen used for continuous medication infusion once daily when switching out medication ball. She should be flushing blood-draw lumen once daily and heparin locking.   Called Alyssa and relayed this to her. She states home health gave her conflicting instructions and that she has been attempting to follow [facility] protocol, but does not have enough supplies for both lumens.   Discussed that because her medication is continuous, she does not need a heparin lock for this lumen and only needs to flush once in between changing of medication ball.   She reports she sometimes has a pause/break between her continuous IV infusion due to having to take the medication out of the fridge for 4 hours prior to administration.  Skai also states that her [facility] RN advised her that one heparin flush was not sufficient to cover the length of her line and told her she would need to use 2 heparin flushes per lumen.   Discussed POA with Anne Ng as staff have received conflicting information. [employer] was told her POA was a friend. This RN was told by person identifying herself as Lanae Boast that she was the patient's POA and mother.   Trellis states her mother is deceased and that her POA is of no relation to her. She is okay with her POA calling the office, but would prefer that any new information or  instructions be related to herself directly.   Spoke with Elita Quick and updated her on conversation with Anne Ng. Pam confirms they received the orders for NS bolus and will get that out to her today. Pam will reach out to patient's [facility] team to ensure that education being provided to Varvara is consistent.   Sandie Ano, RN

## 2023-05-19 ENCOUNTER — Emergency Department (HOSPITAL_COMMUNITY)

## 2023-05-19 ENCOUNTER — Emergency Department (HOSPITAL_COMMUNITY)
Admission: EM | Admit: 2023-05-19 | Discharge: 2023-05-19 | Disposition: A | Discharge status: Home / Self Care | Attending: Emergency Medicine | Admitting: Emergency Medicine

## 2023-05-19 ENCOUNTER — Other Ambulatory Visit: Payer: Self-pay

## 2023-05-19 ENCOUNTER — Encounter (HOSPITAL_COMMUNITY): Payer: Self-pay

## 2023-05-19 DIAGNOSIS — Z452 Encounter for adjustment and management of vascular access device: Secondary | ICD-10-CM | POA: Insufficient documentation

## 2023-05-19 DIAGNOSIS — T82594A Other mechanical complication of infusion catheter, initial encounter: Secondary | ICD-10-CM | POA: Insufficient documentation

## 2023-05-19 DIAGNOSIS — R000 Tachycardia, unspecified: Secondary | ICD-10-CM | POA: Insufficient documentation

## 2023-05-19 DIAGNOSIS — T829XXA Unspecified complication of cardiac and vascular prosthetic device, implant and graft, initial encounter: Secondary | ICD-10-CM

## 2023-05-19 MED ORDER — ALTEPLASE 2 MG IJ SOLR
2.0000 mg | Freq: Once | INTRAMUSCULAR | Status: AC | PRN
  Administered 2023-05-19: 2 mg
  Filled 2023-05-19: qty 2

## 2023-05-19 NOTE — ED Notes (Signed)
 SWOT at bedside with pt to assess PICC

## 2023-05-19 NOTE — ED Triage Notes (Signed)
 Pt arrived via POV for PICC Line occlusion problem. Pt reports requiring IV antibiotics for an on-going infection and needs her PICC Line to function properly.

## 2023-05-19 NOTE — Progress Notes (Signed)
 Spoke with Enid Derry RN and patient over phone. Discussed painful PICC presentation in ED. Recommended PICC be discontinued due to patient stating "PICC has been painful from insertion site to chest wall since insertion." Recommend with replacement by IR.

## 2023-05-19 NOTE — ED Provider Notes (Signed)
 [place] EMERGENCY DEPARTMENT AT [facility] Provider Note   CSN: [id number] Arrival date & time: 05/19/23  1644     History {Add pertinent medical, surgical, social history, OB history to HPI:1} Chief Complaint  Patient presents with   Vascular Access Problem    Traci Hensley is a 33 y.o. female.  HPI 33 year old female presents with concern for an obstructed PICC line.  There are 2 ports and one of them she feels that is obstructed since earlier in the day.  She gets fluids in 1 as well as blood draws as well as medicines for antibiotics and the other.  She just had this PICC line replaced about 2 weeks ago.  Denies any arm symptoms such as pain, numbness, swelling, redness.  Home Medications Prior to Admission medications   Medication Sig Start Date End Date Taking? Authorizing Provider  acetaminophen (TYLENOL) 500 MG tablet Take 2,000-2,500 mg by mouth every 6 (six) hours as needed for mild pain (pain score 1-3).    [provider]  albuterol (VENTOLIN HFA) 108 (90 Base) MCG/ACT inhaler Inhale 2 puffs into the lungs every 6 (six) hours as needed for wheezing or shortness of breath. 04/24/23   Milian, Aleen Campi, FNP  azithromycin (ZITHROMAX) 250 MG tablet Take 1 tablet (250 mg total) by mouth daily. 04/21/23 10/18/23  Vu, Tonita Phoenix, MD  baclofen (LIORESAL) 20 MG tablet Take 1 tablet (20 mg total) by mouth as needed for muscle spasms. 04/24/23   Milian, Aleen Campi, FNP  BREO ELLIPTA 100-25 MCG/ACT AEPB Inhale 1 puff into the lungs daily. 04/24/23   MilianAleen Campi, FNP  clopidogrel (PLAVIX) 75 MG tablet Take 1 tablet (75 mg total) by mouth daily. 04/24/23   Milian, Aleen Campi, FNP  cyanocobalamin (VITAMIN B12) 1000 MCG/ML injection INJECT 1 ML INTRAMUSCULARLY EVERY OTHER WEEK 04/24/23   Milian, Aleen Campi, FNP  ergocalciferol (VITAMIN D2) 1.25 MG (50000 UT) capsule Take 50,000 Units by mouth once a week.    [provider]   gabapentin (NEURONTIN) 400 MG capsule Take 1 capsule (400 mg total) by mouth 2 (two) times daily. 04/24/23   Milian, Aleen Campi, FNP  ibuprofen (ADVIL) 200 MG tablet Take 1,600 mg by mouth every 6 (six) hours as needed for mild pain (pain score 1-3).    [provider]  linezolid (ZYVOX) 600 MG tablet Take 1 tablet (600 mg total) by mouth 2 (two) times daily. 04/21/23 05/21/23  Vu, Tonita Phoenix, MD  metoCLOPramide (REGLAN) 5 MG tablet Take 1 tablet (5 mg total) by mouth 3 (three) times daily before meals. 05/14/23   Gabriel Earing, FNP  mupirocin ointment (BACTROBAN) 2 % Apply to skin rash on leg/ankle once daily. 03/03/23   McCaughan, Dia D, DPM  nitroGLYCERIN (NITROSTAT) 0.4 MG SL tablet Place 1 tablet (0.4 mg total) under the tongue every 5 (five) minutes as needed for chest pain. 06/17/14   Jake Bathe, MD  NONFORMULARY OR COMPOUNDED ITEM Apply 1 application  topically 4 (four) times daily. Katamine/Gabapentin/Baclofen/Lidocaine/Menthol Cream    [provider]  norethindrone-ethinyl estradiol (NORTREL 0.5/35, 28,) 0.5-35 MG-MCG tablet Take 1 tablet by mouth daily. 04/24/23   Milian, Aleen Campi, FNP  olmesartan (BENICAR) 20 MG tablet Take 1 tablet (20 mg total) by mouth daily. 04/24/23   Milian, Aleen Campi, FNP  ondansetron (ZOFRAN-ODT) 8 MG disintegrating tablet Take 1 tablet (8 mg total) by mouth as needed for nausea or vomiting. 04/24/23   Milian, Aleen Campi,  FNP  oxyCODONE-acetaminophen (PERCOCET) 7.5-325 MG tablet Take 1 tablet by mouth every 6 (six) hours as needed for up to 8 days for severe pain (pain score 7-10). 05/14/23 05/22/23  Daiva Eves, Lisette Grinder, MD  predniSONE (DELTASONE) 5 MG tablet Take 1 tablet (5 mg total) by mouth daily. 04/02/23   Milian, Aleen Campi, FNP  promethazine (PHENERGAN) 25 MG tablet Take 1 tablet (25 mg total) by mouth every 6 (six) hours as needed for nausea or vomiting. Would take one hour prior to taking antibiotics. Try to see if  this works better than zofran for nausea 05/14/23   Daiva Eves, Lisette Grinder, MD  sulfamethoxazole-trimethoprim (BACTRIM DS) 800-160 MG tablet Take 1 tablet by mouth 2 (two) times daily. 04/21/23 10/18/23  Vu, Gershon Mussel T, MD  triamcinolone cream (KENALOG) 0.1 % Apply 1 Application topically daily. 03/17/23   [provider]      Allergies    Adhesive [tape], Blue dyes (parenteral), Contrast media [iodinated contrast media], Corn-containing products, Green dyes, Nickel, Omnipaque [iohexol], Onion, Other, Petroleum distillate, Silicone, Bee venom, Cortisporin-tc [neomycin-colist-hc-thonzonium], and Yellow jacket venom    Review of Systems   Review of Systems  Constitutional:  Negative for fever.  Musculoskeletal:  Negative for joint swelling.    Physical Exam Updated Vital Signs BP (!) 120/107   Pulse (!) 114   Temp 98.9 F (37.2 C) (Oral)   Resp 16   Ht 5' (1.524 m)   Wt 54.6 kg   SpO2 99%   BMI 23.51 kg/m  Physical Exam Vitals and nursing note reviewed.  Constitutional:      Appearance: She is well-developed.  HENT:     Head: Normocephalic and atraumatic.  Cardiovascular:     Rate and Rhythm: Regular rhythm. Tachycardia present.     Pulses:          Radial pulses are 2+ on the left side.  Pulmonary:     Effort: Pulmonary effort is normal.  Musculoskeletal:     Comments: Left upper extremity PICC does not have any signs of erythema or discharge. No tenderness or upper arm swelling. Normal grip strength in left hand. Grossly normal sensation.  Skin:    General: Skin is warm and dry.  Neurological:     Mental Status: She is alert.     ED Results / Procedures / Treatments   Labs (all labs ordered are listed, but only abnormal results are displayed) Labs Reviewed - No data to display  EKG None  Radiology No results found.  Procedures Procedures  {Document cardiac monitor, telemetry assessment procedure when appropriate:1}  Medications Ordered in ED Medications -  No data to display  ED Course/ Medical Decision Making/ A&P   {   Click here for ABCD2, HEART and other calculatorsREFRESH Note before signing :1}                              Medical Decision Making  ***  {Document critical care time when appropriate:1} {Document review of labs and clinical decision tools ie heart score, Chads2Vasc2 etc:1}  {Document your independent review of radiology images, and any outside records:1} {Document your discussion with family members, caretakers, and with consultants:1} {Document social determinants of health affecting pt's care:1} {Document your decision making why or why not admission, treatments were needed:1} Final Clinical Impression(s) / ED Diagnoses Final diagnoses:  None    Rx / DC Orders ED Discharge Orders  None

## 2023-05-19 NOTE — ED Notes (Signed)
 Left arm assessed, no warmth, swelling/redness noted. Cathflo injected slowly, some blood came back through cath, was able to flush cathflo through. Pt stated she could feel it up arm and felt weird but states every time she flushed with NS she feels the same. Will wait 30 min and assess for aspiration/flush. Pt in nad

## 2023-05-19 NOTE — ED Notes (Signed)
 Pt. returned from XR.

## 2023-05-19 NOTE — Discharge Instructions (Addendum)
 The PICC line team is recommending the removal of your PICC.  Follow-up with the interventional radiology team to discuss replacing it.  Return to the ER if you notice any new or worsening symptoms.

## 2023-05-19 NOTE — ED Notes (Signed)
 Red port with cathflo aspirated well and flushed well. Pt still had the same feeling with pushing as has been the last 10 days it has been in. No redness/swelling/warmth noted to arm. Per PICC team, recommended IR to look at it prior to using due to the feeling she is having with the flushing. EDP aware and will speak with pt.

## 2023-05-21 ENCOUNTER — Encounter: Payer: Self-pay | Admitting: Internal Medicine

## 2023-05-21 ENCOUNTER — Ambulatory Visit: Payer: Medicare Other | Attending: Internal Medicine | Admitting: Internal Medicine

## 2023-05-21 VITALS — BP 124/80 | HR 82 | Ht 60.0 in | Wt 120.6 lb

## 2023-05-21 DIAGNOSIS — A439 Nocardiosis, unspecified: Secondary | ICD-10-CM

## 2023-05-21 MED ORDER — HYDRALAZINE HCL 10 MG PO TABS
5.0000 mg | ORAL_TABLET | ORAL | 3 refills | Status: DC | PRN
Start: 2023-05-21 — End: 2023-07-23

## 2023-05-21 NOTE — Patient Instructions (Signed)
 Medication Instructions:  Your physician has recommended you make the following change in your medication:   -Start Hydralazine 5 mg (half tablet) as needed for blood pressure over 140 mmHg.    *If you need a refill on your cardiac medications before your next appointment, please call your pharmacy*   Lab Work: None If you have labs (blood work) drawn today and your tests are completely normal, you will receive your results only by: MyChart Message (if you have MyChart) OR A paper copy in the mail If you have any lab test that is abnormal or we need to change your treatment, we will call you to review the results.   Testing/Procedures: None   Follow-Up: At [facility], you and your health needs are our priority.  As part of our continuing mission to provide you with exceptional heart care, we have created designated Provider Care Teams.  These Care Teams include your primary Cardiologist (physician) and Advanced Practice Providers (APPs -  Physician Assistants and Nurse Practitioners) who all work together to provide you with the care you need, when you need it.  We recommend signing up for the patient portal called "MyChart".  Sign up information is provided on this After Visit Summary.  MyChart is used to connect with patients for Virtual Visits (Telemedicine).  Patients are able to view lab/test results, encounter notes, upcoming appointments, etc.  Non-urgent messages can be sent to your provider as well.   To learn more about what you can do with MyChart, go to [email or web address].    Your next appointment:   1 month(s)  Provider:   You may see Luane School, MD or one of the following Advanced Practice Providers on your designated Care Team:   [place], PA-C  Jacolyn Reedy, [place]     Other Instructions

## 2023-05-21 NOTE — Progress Notes (Signed)
 Cardiology Office Note  Date: 05/21/2023   ID: [name], DOB 1990/05/15, MRN [id number]  PCP:  Arrie Senate, FNP  Cardiologist:  None Electrophysiologist:  None   History of Present Illness: Traci Hensley is a 33 y.o. female known to have spontaneous right vertebral artery dissection, leg infection on antibiotics through PICC line, anxiety, history of dysautonomia is here for follow-up visit.  Chronic ongoing symptoms of chest pain, dizziness, palpitations, fatigue and erratic heart rates.  Echo, stress test and event monitor within normal limits. No arrhythmias were noted. She was previously admitted for spontaneous right vertebral artery dissection and was started on DAPT.  Currently has PICC line for antibiotics for treatment of leg infection secondary to Nocardia versus mycobacterial infections.  She recently saw Dr. Graciela Husbands for dysautonomia management.  She was referred to [facility] but she did not have anyone to drive her to [facility].  Transportation issues.  She does not have any family support.  Her roommate is always high on drugs.  Her mother is dead.  She continues to have symptoms of palpitations, dizziness, SOB etc.  She barely drinks any water or eats food due to nausea from antibiotics.  Currently taking Phenergan.  Past Medical History:  Diagnosis Date   Agoraphobia    Anxiety    B12 deficiency anemia    Bipolar 1 disorder (HCC)    Chronic abdominal pain    Chronic chest pain    Depression    Dystonia    Unknown which type   PTSD (post-traumatic stress disorder)    Scoliosis    Skin lesions 05/13/2023   Vertebral artery dissection (HCC) 11/03/2022    Past Surgical History:  Procedure Laterality Date   MOUTH SURGERY      Current Outpatient Medications  Medication Sig Dispense Refill   acetaminophen (TYLENOL) 500 MG tablet Take 2,000-2,500 mg by mouth every 6 (six) hours as needed for mild pain (pain score 1-3).     albuterol  (VENTOLIN HFA) 108 (90 Base) MCG/ACT inhaler Inhale 2 puffs into the lungs every 6 (six) hours as needed for wheezing or shortness of breath. 34 g 2   azithromycin (ZITHROMAX) 250 MG tablet Take 1 tablet (250 mg total) by mouth daily. 30 tablet 5   baclofen (LIORESAL) 20 MG tablet Take 1 tablet (20 mg total) by mouth as needed for muscle spasms. 30 each 1   BREO ELLIPTA 100-25 MCG/ACT AEPB Inhale 1 puff into the lungs daily. 120 each 6   cefOXitin (MEFOXIN) 2 g injection Inject into the vein.     clopidogrel (PLAVIX) 75 MG tablet Take 1 tablet (75 mg total) by mouth daily. 90 tablet 1   cyanocobalamin (VITAMIN B12) 1000 MCG/ML injection INJECT 1 ML INTRAMUSCULARLY EVERY OTHER WEEK 1 mL 6   EPINEPHrine 0.3 mg/0.3 mL IJ SOAJ injection Inject 0.3 mg into the muscle as needed.     ergocalciferol (VITAMIN D2) 1.25 MG (50000 UT) capsule Take 50,000 Units by mouth once a week.     gabapentin (NEURONTIN) 400 MG capsule Take 1 capsule (400 mg total) by mouth 2 (two) times daily. 60 capsule 2   ibuprofen (ADVIL) 200 MG tablet Take 1,600 mg by mouth every 6 (six) hours as needed for mild pain (pain score 1-3).     levalbuterol (XOPENEX HFA) 45 MCG/ACT inhaler      metoCLOPramide (REGLAN) 5 MG tablet Take 1 tablet (5 mg total) by mouth 3 (three) times daily  before meals. 270 tablet 0   mupirocin ointment (BACTROBAN) 2 % Apply to skin rash on leg/ankle once daily. 22 g 1   nitroGLYCERIN (NITROSTAT) 0.4 MG SL tablet Place 1 tablet (0.4 mg total) under the tongue every 5 (five) minutes as needed for chest pain. 25 tablet 12   NONFORMULARY OR COMPOUNDED ITEM Apply 1 application  topically 4 (four) times daily. Katamine/Gabapentin/Baclofen/Lidocaine/Menthol Cream     norethindrone-ethinyl estradiol (NORTREL 0.5/35, 28,) 0.5-35 MG-MCG tablet Take 1 tablet by mouth daily. 28 tablet 11   olmesartan (BENICAR) 20 MG tablet Take 1 tablet (20 mg total) by mouth daily. 90 tablet 1   ondansetron (ZOFRAN-ODT) 8 MG  disintegrating tablet Take 1 tablet (8 mg total) by mouth as needed for nausea or vomiting. 20 tablet 2   oxyCODONE-acetaminophen (PERCOCET) 7.5-325 MG tablet Take 1 tablet by mouth every 6 (six) hours as needed for up to 8 days for severe pain (pain score 7-10). 30 tablet 0   predniSONE (DELTASONE) 5 MG tablet Take 1 tablet (5 mg total) by mouth daily. 30 tablet 4   promethazine (PHENERGAN) 25 MG tablet Take 1 tablet (25 mg total) by mouth every 6 (six) hours as needed for nausea or vomiting. Would take one hour prior to taking antibiotics. Try to see if this works better than zofran for nausea 60 tablet 3   sulfamethoxazole-trimethoprim (BACTRIM DS) 800-160 MG tablet Take 1 tablet by mouth 2 (two) times daily. 60 tablet 5   triamcinolone cream (KENALOG) 0.1 % Apply 1 Application topically daily.     linezolid (ZYVOX) 600 MG tablet Take 1 tablet (600 mg total) by mouth 2 (two) times daily. 60 tablet 0   No current facility-administered medications for this visit.   Allergies:  Adhesive [tape], Blue dyes (parenteral), Contrast media [iodinated contrast media], Corn-containing products, Green dyes, Nickel, Omnipaque [iohexol], Onion, Other, Petroleum distillate, Silicone, Bee venom, Cortisporin-tc [neomycin-colist-hc-thonzonium], and Yellow jacket venom   Social History: The patient  reports that she quit smoking about 9 years ago. Her smoking use included cigarettes. She started smoking about 11 years ago. She has never used smokeless tobacco. She reports that she does not currently use alcohol. She reports that she does not use drugs.   Family History: The patient's family history includes Arrhythmia in her mother; Hypertension in her father; Thyroid disease in her father.   ROS:  Please see the history of present illness. Otherwise, complete review of systems is positive for none  All other systems are reviewed and negative.   Physical Exam: VS:  BP 124/80 (Cuff Size: Normal)   Pulse 82   Ht  5' (1.524 m)   Wt 120 lb 9.6 oz (54.7 kg)   SpO2 99%   BMI 23.55 kg/m , BMI Body mass index is 23.55 kg/m.  Wt Readings from Last 3 Encounters:  05/21/23 120 lb 9.6 oz (54.7 kg)  05/19/23 120 lb 5.9 oz (54.6 kg)  05/14/23 120 lb 6.4 oz (54.6 kg)    General: Patient appears comfortable at rest. HEENT: Conjunctiva and lids normal, oropharynx clear with moist mucosa. Neck: Supple, no elevated JVP or carotid bruits, no thyromegaly. Lungs: Clear to auscultation, nonlabored breathing at rest. Cardiac: Regular rate and rhythm, no S3 or significant systolic murmur, no pericardial rub. Abdomen: Soft, nontender, no hepatomegaly, bowel sounds present, no guarding or rebound. Extremities: No pitting edema, distal pulses 2+. Skin: Warm and dry. Musculoskeletal: No kyphosis. Neuropsychiatric: Alert and oriented x3, affect grossly appropriate.  Recent Labwork:  08/27/2022: TSH 3.320 10/24/2022: Magnesium 2.0 05/09/2023: ALT 11; AST 12; BUN 15; Creatinine, Ser 0.68; Hemoglobin 12.2; Platelets 320; Potassium 3.6; Sodium 137     Component Value Date/Time   CHOL 209 (H) 10/16/2022 0851   TRIG 73 10/16/2022 0851   HDL 52 10/16/2022 0851   CHOLHDL 4.0 10/16/2022 0851   LDLCALC 144 (H) 10/16/2022 0851     Assessment and Plan:  Dysautonomia : Chronic ongoing symptoms of chest pain, palpitations, fatigue and erratic heart rates.  Event monitor was unremarkable.  Previously on propranolol but had to be discontinued due to hypotension.  Echocardiogram, event monitor from 2023 and 2024, NM stress test were unremarkable.  Previously referred to [facility] for the management of dysautonomia but she will [place].  She was also referred by Dr. Graciela Husbands to [facility] for the management dysautonomia but she has transportation issues driving up there.  Management limited at this time due to insurance and transportation issues.  She reports having elevated blood pressure but  unclear if this is secondary to pain.  Will start hydralazine 5 mg as needed for BP more than 140 mmHg SBP.  Strongly encouraged p.o. hydration and healthy meals.  Leg infection secondary nocardia versus mycobacterial infection: Currently taking 3 antibiotics through PICC line.  Nausea from antibiotics, taking Phenergan.  IV fluids twice a week through PICC line.  Spontaneous right vertebral artery dissection in 8/24: Follows up with neurology, recommended to repeat MRA head + MRA neck in 3 months, DAPT for 6 months.  Anxiety: Follow-up with PCP.    Medication Adjustments/Labs and Tests Ordered: Current medicines are reviewed at length with the patient today.  Concerns regarding medicines are outlined above.    Disposition:  Follow up 1 year  Signed Dareion Kneece Verne Spurr, MD, 05/21/2023 11:37 AM    [facility] at [facility] [address] [place], [place], [place] [address]

## 2023-05-22 ENCOUNTER — Ambulatory Visit (HOSPITAL_COMMUNITY)
Admission: RE | Admit: 2023-05-22 | Discharge: 2023-05-22 | Disposition: A | Discharge status: Home / Self Care | Source: Ambulatory Visit | Attending: Emergency Medicine | Admitting: Emergency Medicine

## 2023-05-22 ENCOUNTER — Other Ambulatory Visit (HOSPITAL_COMMUNITY): Payer: Self-pay | Admitting: Emergency Medicine

## 2023-05-22 ENCOUNTER — Telehealth: Payer: Self-pay | Admitting: Family Medicine

## 2023-05-22 DIAGNOSIS — T829XXA Unspecified complication of cardiac and vascular prosthetic device, implant and graft, initial encounter: Secondary | ICD-10-CM

## 2023-05-22 DIAGNOSIS — Z452 Encounter for adjustment and management of vascular access device: Secondary | ICD-10-CM | POA: Insufficient documentation

## 2023-05-22 NOTE — Telephone Encounter (Signed)
 If appropriate I can call HH with a verbal to add labwork.

## 2023-05-22 NOTE — Telephone Encounter (Signed)
 Copied from CRM [id number]. Topic: Referral - Request for Referral >> May 22, 2023 10:55 AM Turkey B wrote: Did the patient discuss referral with their provider in the last year? yes   Type of order/referral and detailed reason for visit: PT with [facility]  Preference of office, provider, location: with [facility]  If referral order, have you been seen by this specialty before? yes (If Yes, this issue or another issue? When? Where? Had therapy yesterday, needs continuation of PT  Can we respond through MyChart? yes

## 2023-05-22 NOTE — Telephone Encounter (Signed)
 Talked w/ [name] w/ [facility] VO given to add Magnesium and Iron

## 2023-05-22 NOTE — Telephone Encounter (Signed)
 Copied from CRM [id number]. Topic: General - Other >> May 22, 2023 10:59 AM Turkey B wrote: Reason for CRM: pt called in asking for lab orders for Magnesium and iron Pt has home health draw blood every Monday wants to know if this can also be added to those orders. Pt states she has not been eating or drinking really well for the past month.

## 2023-05-22 NOTE — Telephone Encounter (Signed)
 Talked w/ [name] w/ [facility] She will talk w/ Physical therapist who is seeing pt about continuation.

## 2023-05-22 NOTE — Progress Notes (Signed)
 Patient came in for PICC exchange due to malfunctioning PICC.   Seen in IR, patient has left arm PICC, she states that the purple lumen counld not be flushed yesterday, she was told that there might be a small clot at the catheter tip. Also states that there is some redness around the insertion site.   Both lumen flush and aspirate well, the insertion site with little but of erythema from irritation but the site is clean and dry, no discharge. New bio patch and dressing placed. CXR showed the PICC tip at the appropriate position.   After thorough discussion and shared decision making, decision was made to NOT proceed with PICC exchange as the PICC is appropriately positioned and functioning well.   Patient was encouraged to contact [facility] IR if she has questions or concerns.  She will be discharged home from IR today.    Lynann Bologna Myrian Botello PA-C 05/22/2023 9:21 AM

## 2023-05-22 NOTE — Telephone Encounter (Signed)
 TC to [facility] x [phone] [facility]

## 2023-05-27 ENCOUNTER — Encounter: Payer: Self-pay | Admitting: Family Medicine

## 2023-05-29 ENCOUNTER — Telehealth: Payer: Self-pay

## 2023-05-29 NOTE — Telephone Encounter (Signed)
 [name], RN with [facility] called to report patient creatinine 1.26 earlier this week. Last week was 1.09.   RN wanting to know if they can get a verbal order for 1 extra liter of saline to give to patient during the week to see if it helps creatinine. Patient reports feeling better after getting saline.    [name], RN call back number is [phone]   Marcell Anger, CMA

## 2023-05-29 NOTE — Telephone Encounter (Signed)
 RN aware for patient to increase fluid intake PO.   RN voiced her understanding and will inform patient.    Madalen Gavin Lesli Albee, CMA

## 2023-05-30 NOTE — Telephone Encounter (Signed)
 Patient called regarding increase in IV fluids.  Patient advised increase fluid intake po per Dr. Daiva Eves. Traci Hensley Traci Hensley, CMA

## 2023-06-03 NOTE — Progress Notes (Unsigned)
 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  Would not be explained by the vertebral artery dissection.  Cannot be explained by imaging of neuro-axis.  Consider functional  Numbness/pain/tingling - unclear if related to B12 deficiency Dysautonomia  Chronic pain   Regarding vertebral artery dissection: Repeat MRA head and neck Would check autoimmune labs (ANA, ENA panel).  Agree with referral to rheumatology. From standpoint of vertebral artery dissection, ASA 81mg  daily alone may just be indicated (not even Plavix).  If her other providers feel that Plavix and/or heparin needs to be changed to another anticoagulant, I have no objection.  If there is a concern for an underlying hypercoagulable state, I defer to PCP regarding if hematology consult is indicated.   Regarding symptoms of neuropathy: NCV-EMG of right upper and lower extremities.  Needle exam may be limited as she is on both Plavix and Heparin at this time Check B12 and TSH I do not evaluate or treat dysautonomia.  PCP may need to refer out for a specialist, which often needs to be seen at an academic center. Further recommendations pending results.  Otherwise follow up in 6 months.  Total time spent in chart and face to face with patient:  66 minutes.   Subjective:  Traci Hensley is a 33 year old right-handed female with PTSD, Bipolar 1 disorder, dysautonomia, lupus, chronic pain syndrome, cervical spondylosis, chronic low back pain with sciatica, depression and anxiety who presents for vertebral artery dissection.  History supplemented by hospital and prior neurologist's  notes.  In early August 2024, she developed sudden onset of right sided neck pain.  Denied any preceding trauma.  A week later, she developed a headache and blurred vision.  She also developed right arm and leg weakness.  Seen in the ED on 10/24/2022 where MRA revealed right vertebral artery dissection.  MRI of brain was negative for acute stroke.  She was started on ASA.  Over the next 3 days she developed worsening headaches and returned to the ED on 8/18.  Repeat imaging showed stable to improved right vertebral artery dissection.  She was changed from ASA 81mg  to ASA 81mg  and Plavix 75mg  DAPT for 3 months.    She followed up with outpatient neurology on 8/21 who increased her gabapentin to 800mg  three times daily to treat headaches and neck pain which was ineffective.  Plavix was discontinued as it may have been responsible for a rash, but she was maintained on ASA 81mg  daily.  She started experiencing worsening right sided neck pain with headaches and returned to the ED on 01/18/2023.  MRA head and neck showed improved appearance of the distal right vertebral artery with negative intracranial MRA.  MRI of brain revealed no acute findings.  Since the vertebral dissection, she reported worsening bilateral upper and lower extremity weakness (worse on the right side) with right leg pain as well as poor balance and decreased dexterity and fine motor skills in both hands.  MRI of cervical/thoracic/lumbar spine were unremarkable.  She remains in physical therapy and uses a brace on her right ankle due to foot drop.    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  PICC line placed which  kept clotting.  She was switched from ASA to Plavix and eventually heparin was added.  Nobody wants to change Plavix to another blood thinner because it was initially started by neurology.    Has chronic pain including neck, back, chest, abdomen and joints.  Pain and weakness in legs have been ongoing since at  least 2019.  Underwent extensive workup in the past.  For further evaluation of weakness, pain and numbness, had MRIs of the spine on 12/28/2022.  MRI of cervical spine revealed small right paracentral disc protrusions at C4-5 and C5-6 and small left paracentral disc protrusions at C6-7 without significant stenosis or neural impingement.  MRI of thoracic spine revealed chronic but mild vertebral endplate irregularity but no thoracic disc herniation, spinal stenosis or neural impingement. MRI of lumbar spine revealed isolated lumbar disc degeneration at L5-S1 with small central disc protrusion in proximity of S1 nerve root but no spinal stenosis or neural impingement.  She reports longstanding history of numbness, tingling and pain in her extremities.  Was previously evaluated by a neurologist who since closed his practice.  Diagnosed with neuropathy, possibly related to her B12 deficiency.  She currently takes shots every 2 weeks because she was not responding to oral supplements.    She also has dysautonomia.  Seen cardiology but POTS was ruled out.  She reports upcoming appointment with rheumatology in May.   Imaging: 10/24/2022 MRI/MRV HEAD and MRA HEAD & NECK:  1. No acute intracranial abnormality. 2. Dissection of the right vertebral artery, proximal V2 segment, with possible intramural hematoma. The right vertebral artery is mildly narrowed through the entire V2 segment and reconstitutes at the level of the V3 segment. 3. No intracranial large vessel occlusion or significant stenosis. 4. No evidence of dural venous sinus or deep cerebral vein thrombosis. 10/27/2022 MRI BRAIN & MRA HEAD & NECK:  1. Persistent dissection involving the proximal right V2 segment, with mild irregularity and narrowing through the V2 segment. Overall appearance is not significantly changed as compared to recent MRI from 10/24/2022. No extension of dissection or progressive stenosis. 2. Otherwise unremarkable and normal MRA  of the neck. 01/19/2023 MRI BRAIN:  1. Stable normal brain MRI.  No acute intracranial abnormality. 2. 4-5 mm cerebellar tonsillar ectopia without frank Chiari malformation. 01/19/2023 MRA HEAD:  Improved appearance of the distal right vertebral artery since August, see also Neck MRA today. Negative intracranial MRA appearance now. 01/19/2023 MRA NECK:  1. Postcontrast portion of neck MRA today limited due to venous contamination. But substantially improved appearance of the right vertebral V2 segment on 3D time-of-flight since August, compatible with healing dissection. Nearly symmetric appearance of the cervical vertebrals now, no evidence of stenosis. 2. Left vertebral and cervical carotid arteries appear to remain normal.  05/03/2017 MRI BRAIN WO:  1. No acute intracranial process. 2. Pituitary gland extending into the suprasellar fossa potentially due to shallow sella though mass not excluded. Recommend MRI sella protocol with contrast on a nonemergent basis. 3. Otherwise negative MRI of the head without contrast. 05/03/2017 MRI C-SPINE:  1. No fracture or malalignment.  No acute osseous process. 2. Tiny C6-7 and C7-T1 disc protrusions without canal stenosis or neural foraminal narrowing at any level. 05/03/2017 MRI T-SPINE:  1. No fracture or malalignment.  No acute osseous process. 2. Multilevel spondylosis. No canal stenosis or neural foraminal narrowing. 08/30/2017 MRI L-SPINE WO:  Minimal disc desiccation at L5-S1.  Unremarkable lumbar spine MRI examination. 01/24/2020 MRI BRAIN WO:  Likely stable prominence of  the pituitary with mild suprasellar extension. Patient declined contrast. 07/16/2021 MRI BRAIN WO:  1. Unchanged prominent pituitary gland (8 x 14 x 8 mm). 2. No acute intracranial process. 12/28/2022 MRI C-SPINE WO:  1. Small right paracentral disc protrusions at C4-5 and C5-6 without significant stenosis or neural impingement. 2. Small left paracentral disc protrusion at C6-7 without  significant stenosis or impingement. 3. Overall, these changes are mildly progressed as compared to previous MRI from 05/03/2017. 12/28/2022 MRI T-SPINE WO:  Stable MRI appearance of the thoracic spine since 2019.  Chronic vertebral endplate irregularity, generally mild, could be related to Schmorl's nodes or Scheuermann's disease. No acute osseous abnormality. No thoracic disc herniation, spinal stenosis, or neural impingement. 12/28/2022 MRI L-SPINE WO:  1. Isolated lumbar disc degeneration at L5-S1 with small central disc protrusion which might be a source of S1 radiculitis. But there is no spinal stenosis or neural impingement. 2. Otherwise negative lumbar MRI.   Current medications:  ASA 81mg , Plavix 75mg , gabapentin 400mg  BID, baclofen 20mg  PRN, Reglan 5mg , Zofran-ODT 8mg , promethazine 25mg , hydralazine, olmesartan, prednisone 5mg  daily Past medications:  topiramate, propranolol, metoprolol, Ajovy, Nurtec, pregablin, Zofran ODT 8mg   PAST MEDICAL HISTORY: Past Medical History:  Diagnosis Date   Agoraphobia    Anxiety    B12 deficiency anemia    Bipolar 1 disorder (HCC)    Chronic abdominal pain    Chronic chest pain    Depression    Dystonia    Unknown which type   PTSD (post-traumatic stress disorder)    Scoliosis    Skin lesions 05/13/2023   Vertebral artery dissection (HCC) 11/03/2022    PAST SURGICAL HISTORY: Past Surgical History:  Procedure Laterality Date   IR FLUORO RM 30-60 MIN  05/22/2023   MOUTH SURGERY      MEDICATIONS: Current Outpatient Medications on File Prior to Visit  Medication Sig Dispense Refill   acetaminophen (TYLENOL) 500 MG tablet Take 2,000-2,500 mg by mouth every 6 (six) hours as needed for mild pain (pain score 1-3).     albuterol (VENTOLIN HFA) 108 (90 Base) MCG/ACT inhaler Inhale 2 puffs into the lungs every 6 (six) hours as needed for wheezing or shortness of breath. 34 g 2   azithromycin (ZITHROMAX) 250 MG tablet Take 1 tablet (250 mg total)  by mouth daily. 30 tablet 5   baclofen (LIORESAL) 20 MG tablet Take 1 tablet (20 mg total) by mouth as needed for muscle spasms. 30 each 1   BREO ELLIPTA 100-25 MCG/ACT AEPB Inhale 1 puff into the lungs daily. 120 each 6   cefOXitin (MEFOXIN) 2 g injection Inject into the vein.     clopidogrel (PLAVIX) 75 MG tablet Take 1 tablet (75 mg total) by mouth daily. 90 tablet 1   cyanocobalamin (VITAMIN B12) 1000 MCG/ML injection INJECT 1 ML INTRAMUSCULARLY EVERY OTHER WEEK 1 mL 6   EPINEPHrine 0.3 mg/0.3 mL IJ SOAJ injection Inject 0.3 mg into the muscle as needed.     ergocalciferol (VITAMIN D2) 1.25 MG (50000 UT) capsule Take 50,000 Units by mouth once a week.     gabapentin (NEURONTIN) 400 MG capsule Take 1 capsule (400 mg total) by mouth 2 (two) times daily. 60 capsule 2   hydrALAZINE (APRESOLINE) 10 MG tablet Take 0.5 tablets (5 mg total) by mouth as needed (BP over 140 mmhg). 45 tablet 3   ibuprofen (ADVIL) 200 MG tablet Take 1,600 mg by mouth every 6 (six) hours as needed for mild pain (pain score 1-3).  levalbuterol (XOPENEX HFA) 45 MCG/ACT inhaler      metoCLOPramide (REGLAN) 5 MG tablet Take 1 tablet (5 mg total) by mouth 3 (three) times daily before meals. 270 tablet 0   mupirocin ointment (BACTROBAN) 2 % Apply to skin rash on leg/ankle once daily. 22 g 1   nitroGLYCERIN (NITROSTAT) 0.4 MG SL tablet Place 1 tablet (0.4 mg total) under the tongue every 5 (five) minutes as needed for chest pain. 25 tablet 12   NONFORMULARY OR COMPOUNDED ITEM Apply 1 application  topically 4 (four) times daily. Katamine/Gabapentin/Baclofen/Lidocaine/Menthol Cream     norethindrone-ethinyl estradiol (NORTREL 0.5/35, 28,) 0.5-35 MG-MCG tablet Take 1 tablet by mouth daily. 28 tablet 11   olmesartan (BENICAR) 20 MG tablet Take 1 tablet (20 mg total) by mouth daily. 90 tablet 1   ondansetron (ZOFRAN-ODT) 8 MG disintegrating tablet Take 1 tablet (8 mg total) by mouth as needed for nausea or vomiting. 20 tablet 2    predniSONE (DELTASONE) 5 MG tablet Take 1 tablet (5 mg total) by mouth daily. 30 tablet 4   promethazine (PHENERGAN) 25 MG tablet Take 1 tablet (25 mg total) by mouth every 6 (six) hours as needed for nausea or vomiting. Would take one hour prior to taking antibiotics. Try to see if this works better than zofran for nausea 60 tablet 3   sulfamethoxazole-trimethoprim (BACTRIM DS) 800-160 MG tablet Take 1 tablet by mouth 2 (two) times daily. 60 tablet 5   triamcinolone cream (KENALOG) 0.1 % Apply 1 Application topically daily.     No current facility-administered medications on file prior to visit.    ALLERGIES: Allergies  Allergen Reactions   Adhesive [Tape] Other (See Comments)    Eats skin almost instantly   Blue Dyes (Parenteral) Other (See Comments)    BLUE DYES IS OK BY ITSELF, BUT CANNOT BE COMBINED WITH GREEN DYES   Contrast Media [Iodinated Contrast Media] Nausea And Vomiting    Liquid version not the IV. Causes Vomiting up blood, shaking and tremors uncontrollable, dizziness and lightheadness   Corn-Containing Products Anaphylaxis and Nausea And Vomiting    ALSO GRASS FAMILY PLANTS, VEGETABLES, ETC.    Green Dyes Other (See Comments)    GREEN DYE IS OK BY ITSELF, BUT CANNOT BE COMBINED WITH BLUE DYE   Nickel Itching, Swelling, Rash and Other (See Comments)   Omnipaque [Iohexol]     CT tech notes state "Patient allergy to IV dye"   Onion     Cannot be around onion at all or she has breathing difficulty   Other Nausea Only, Rash and Other (See Comments)    Sunlight causes extreme fatigue , Migraine , Cat Dander  Any type of anesthetic causes her to have prolonged sedation, requiring more than normal doses to reverse them   Petroleum Distillate Itching, Swelling and Rash   Silicone Hives, Swelling and Rash   Bee Venom Hives    Severe disorientation   Cortisporin-Tc [Neomycin-Colist-Hc-Thonzonium] Other (See Comments)    Pain, went deaf   Yellow Jacket Venom Hives    Severe  disorientation    FAMILY HISTORY: Family History  Problem Relation Age of Onset   Arrhythmia Mother    Hypertension Father    Thyroid disease Father    Colon cancer Neg Hx     Objective:  Blood pressure 135/86, pulse 84, height 5' (1.524 m), weight 120 lb (54.4 kg), SpO2 99%. General: No acute distress.  Patient appears well-groomed.   Head:  Normocephalic/atraumatic Eyes:  fundi  examined but not visualized Neck: supple, bilateral paraspinal tenderness, full range of motion Back: Paraspinal tenderness Heart: regular rate and rhythm Neurological Exam: Mental status: alert and oriented to person, place, and time, speech fluent and not dysarthric, language intact. Cranial nerves: CN I: not tested CN II: pupils equal, round and reactive to light, visual fields intact CN III, IV, VI:  full range of motion, no nystagmus, no ptosis CN V: facial sensation intact. CN VII: upper and lower face symmetric CN VIII: hearing intact CN IX, X: gag intact, uvula midline CN XI: sternocleidomastoid and trapezius muscles intact CN XII: tongue midline Bulk & Tone: normal, no fasciculations. Motor:  Give-way weakness diffusely in all extremities.   Sensation:  Reduced pinprick sensation in hands and feet up to mid-shin bilaterally. Deep Tendon Reflexes:  2+ throughout,  toes downgoing.   Finger to nose testing:  Without dysmetria.    Gait:  Antalgic gait.  Romberg negative.    Thank you for allowing me to take part in the care of this patient.  Shon Millet, DO  CC:  Arrie Senate, FNP  Lia Hopping, MD

## 2023-06-03 NOTE — Addendum Note (Signed)
 Encounter addended by: Edward Qualia on: 06/03/2023 4:00 PM  Actions taken: Imaging Exam ended

## 2023-06-04 ENCOUNTER — Telehealth: Payer: Self-pay | Admitting: Internal Medicine

## 2023-06-04 NOTE — Telephone Encounter (Signed)
 Pt calling in stating she was told to reach out to our office once she found a cardiologist in [place] and would like the referral to be sent. Please advise

## 2023-06-05 ENCOUNTER — Ambulatory Visit (INDEPENDENT_AMBULATORY_CARE_PROVIDER_SITE_OTHER): Payer: Medicare Other | Admitting: Neurology

## 2023-06-05 ENCOUNTER — Encounter: Payer: Self-pay | Admitting: Neurology

## 2023-06-05 ENCOUNTER — Other Ambulatory Visit

## 2023-06-05 VITALS — BP 135/86 | HR 84 | Ht 60.0 in | Wt 120.0 lb

## 2023-06-05 DIAGNOSIS — S15102A Unspecified injury of left vertebral artery, initial encounter: Secondary | ICD-10-CM | POA: Diagnosis not present

## 2023-06-05 DIAGNOSIS — G909 Disorder of the autonomic nervous system, unspecified: Secondary | ICD-10-CM

## 2023-06-05 DIAGNOSIS — R202 Paresthesia of skin: Secondary | ICD-10-CM

## 2023-06-05 DIAGNOSIS — I7774 Dissection of vertebral artery: Secondary | ICD-10-CM | POA: Diagnosis not present

## 2023-06-05 DIAGNOSIS — R200 Anesthesia of skin: Secondary | ICD-10-CM | POA: Diagnosis not present

## 2023-06-05 DIAGNOSIS — S15101A Unspecified injury of right vertebral artery, initial encounter: Secondary | ICD-10-CM

## 2023-06-05 NOTE — Patient Instructions (Addendum)
 From my standpoint, if they need to change the blood thinner from Plavix, that is ok with me Repeat MRA head and neck. We have sent a referral to [facility] for your MRA and they will call you directly to schedule your appointment. They are located at [address] [facility]. If you need to contact them directly please call [phone].  Check labs:  ANA with ENA panel, B12, TSH. Your provider has requested that you have labwork completed today. Please go to [facility] ([id number]) on the second floor of this building before leaving the office today. You do not need to check in. If you are not called within 15 minutes please check with the front desk.   Check nerve conduction study of right arm and leg If your PCP is concerned about a blood clotting issue, recommend referral to hematology Follow up 6 months (sooner if needed)

## 2023-06-06 ENCOUNTER — Telehealth: Payer: Self-pay | Admitting: Internal Medicine

## 2023-06-06 DIAGNOSIS — G909 Disorder of the autonomic nervous system, unspecified: Secondary | ICD-10-CM

## 2023-06-06 NOTE — Telephone Encounter (Signed)
 I spoke with patient and she states that she called [facility] and they are not taking new patients.She is concerned she has life threatening arrhythmias which are new and she wants to be seen again. She reports her home health aide has witnessed her having syncopal episodes. I spoke with Dr.Mallipeddi and she suggests that a loop recorder may be beneficial. I will forward message to Dr.Klein.

## 2023-06-06 NOTE — Patient Instructions (Signed)
 Medication Instructions:  Your physician recommends that you continue on your current medications as directed. Please refer to the Current Medication list given to you today.  *If you need a refill on your cardiac medications before your next appointment, please call your pharmacy*  Lab Work: None ordered.  If you have labs (blood work) drawn today and your tests are completely normal, you will receive your results only by: MyChart Message (if you have MyChart) OR A paper copy in the mail If you have any lab test that is abnormal or we need to change your treatment, we will call you to review the results.  Testing/Procedures: None ordered.   Follow-Up: At [facility], you and your health needs are our priority.  As part of our continuing mission to provide you with exceptional heart care, our providers are all part of one team.  This team includes your primary Cardiologist (physician) and Advanced Practice Providers or APPs (Physician Assistants and Nurse Practitioners) who all work together to provide you with the care you need, when you need it.  Your next appointment:   Follow up as needed with Dr Graciela Husbands    1st Floor: - Lobby - Registration  - Pharmacy  - Lab - Cafe  2nd Floor: - PV Lab - Diagnostic Testing (echo, CT, nuclear med)  3rd Floor: - Vacant  4th Floor: - TCTS (cardiothoracic surgery) - AFib Clinic - Structural Heart Clinic - Vascular Surgery  - Vascular Ultrasound  5th Floor: - [facility] (general and EP) - Clinical Pharmacy for coumadin, hypertension, lipid, weight-loss medications, and med management appointments    Valet parking services will be available as well.

## 2023-06-06 NOTE — Telephone Encounter (Signed)
 Patient is calling to talk with Dr. Jenene Slicker or nurse in regards to maybe getting an appt with Dr. Jenene Slicker. Would like a call back to discuss in further detail

## 2023-06-07 LAB — ANA, IFA COMPREHENSIVE PANEL
Anti Nuclear Antibody (ANA): POSITIVE — AB
ENA SM Ab Ser-aCnc: <1 AI
SM/RNP: <1 AI
SSA (Ro) (ENA) Antibody, IgG: <1 AI
SSB (La) (ENA) Antibody, IgG: <1 AI
Scleroderma (Scl-70) (ENA) Antibody, IgG: <1 AI
ds DNA Ab: <1 [IU]/mL

## 2023-06-07 LAB — ANTI-NUCLEAR AB-TITER (ANA TITER)
ANA TITER: 1:40 {titer} — ABNORMAL HIGH
ANA Titer 1: 1:40 {titer} — ABNORMAL HIGH

## 2023-06-07 LAB — TSH: TSH: 2.99 m[IU]/L

## 2023-06-07 LAB — VITAMIN B12: Vitamin B-12: 566 pg/mL (ref 200–1100)

## 2023-06-13 ENCOUNTER — Encounter: Payer: Self-pay | Admitting: Family Medicine

## 2023-06-13 ENCOUNTER — Ambulatory Visit (INDEPENDENT_AMBULATORY_CARE_PROVIDER_SITE_OTHER): Admitting: Family Medicine

## 2023-06-13 ENCOUNTER — Telehealth: Payer: Self-pay | Admitting: Family Medicine

## 2023-06-13 VITALS — BP 120/80 | HR 80 | Temp 98.7°F | Ht 60.0 in | Wt 121.0 lb

## 2023-06-13 DIAGNOSIS — F339 Major depressive disorder, recurrent, unspecified: Secondary | ICD-10-CM | POA: Diagnosis not present

## 2023-06-13 DIAGNOSIS — F32A Depression, unspecified: Secondary | ICD-10-CM

## 2023-06-13 DIAGNOSIS — F419 Anxiety disorder, unspecified: Secondary | ICD-10-CM

## 2023-06-13 DIAGNOSIS — F4310 Post-traumatic stress disorder, unspecified: Secondary | ICD-10-CM

## 2023-06-13 DIAGNOSIS — G909 Disorder of the autonomic nervous system, unspecified: Secondary | ICD-10-CM

## 2023-06-13 DIAGNOSIS — R5381 Other malaise: Secondary | ICD-10-CM

## 2023-06-13 DIAGNOSIS — R002 Palpitations: Secondary | ICD-10-CM

## 2023-06-13 DIAGNOSIS — Z5982 Transportation insecurity: Secondary | ICD-10-CM

## 2023-06-13 DIAGNOSIS — L989 Disorder of the skin and subcutaneous tissue, unspecified: Secondary | ICD-10-CM

## 2023-06-13 DIAGNOSIS — I7774 Dissection of vertebral artery: Secondary | ICD-10-CM | POA: Diagnosis not present

## 2023-06-13 NOTE — Telephone Encounter (Signed)
 Per Pain Management Office - They denied Referral at this time since Patient was under infectious disease Treatment and placed on IV antibiotics.  Please Advise.

## 2023-06-13 NOTE — Telephone Encounter (Signed)
 REFERRAL REQUEST Telephone Note  Have you been seen at our office for this problem? no (Advise that they may need an appointment with their PCP before a referral can be done)  Reason for Referral: pain management Referral discussed with patient: yes per pt  Best contact number of patient for referral team: [phone]     Has patient been seen by a specialist for this issue before: not sure  Patient provider preference for referral: not sure Patient location preference for referral: not sure   Patient notified that referrals can take up to a week or longer to process. If they haven't heard anything within a week they should call back and speak with the referral department.    Please call pt back, she has been waiting on referral.
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
## 2023-06-13 NOTE — Patient Instructions (Signed)
 Visit Information  Thank you for taking time to visit with me today. Please don't hesitate to contact me if I can be of assistance to you.   Following are the goals we discussed today:   Goals Addressed             This Visit's Progress    COMPLETED: Patient will receive care coordination services (Managed Medicaid, Home health, PCS) neurological services   On track    Interventions Today    Flowsheet Row Most Recent Value  Chronic Disease   Chronic disease during today's visit Other  [Home health agencies, Personal care services, neurology, counselor services, warm transfer to Managed Medicaid Tailored plan service]  General Interventions   General Interventions Discussed/Reviewed General Interventions Reviewed, Doctor Visits, [facility], Communication with, Level of Care  Doctor Visits Discussed/Reviewed Doctor Visits Reviewed, PCP, Specialist  [Reviewed pcp visit, pending neurology/counseling visits, Tailored plan care coordination services]  PCP/Specialist Visits Compliance with follow-up visit  Communication with RN, Social Work  [employer] with Care coordination team]  Level of Care Personal Care Services  [confirmed Marijo received outreaches from home health agencies, [place] [facility], pcp]  Education Interventions   Education Provided Provided Education  [employer] medicaid tailored plan care coordination services]  Provided Verbal Education On [facility]  Mental Health Interventions   Mental Health Discussed/Reviewed Mental Health Reviewed, Coping Strategies  Nutrition Interventions   Nutrition Discussed/Reviewed Nutrition Discussed, Fluid intake  Pharmacy Interventions   Pharmacy Dicussed/Reviewed Pharmacy Topics Discussed, Affording Medications              Our next appointment is no further scheduled appointments.  on n/a at n/a  Please call the care guide team at [phone] if you need to cancel or reschedule your appointment.   If you are  experiencing a Mental Health or Behavioral Health Crisis or need someone to talk to, please call the Suicide and Crisis Lifeline: 988 call the [place] National Suicide Prevention Lifeline: (904)756-9823 or TTY: 515-247-7271 TTY 670-766-5846) to talk to a trained counselor call 1-800-273-TALK (toll free, 24 hour hotline) call the [facility]: (941)382-8287 call 911   Patient verbalizes understanding of instructions and care plan provided today and agrees to view in MyChart. Active MyChart status and patient understanding of how to access instructions and care plan via MyChart confirmed with patient.     The patient has been provided with contact information for the care management team and has been advised to call with any health related questions or concerns.   December Hedtke L. Noelle Penner, RN, BSN, CCM [facility] RN Care Manager [phone]

## 2023-06-14 ENCOUNTER — Other Ambulatory Visit: Payer: Self-pay

## 2023-06-14 ENCOUNTER — Observation Stay (HOSPITAL_COMMUNITY)

## 2023-06-14 ENCOUNTER — Inpatient Hospital Stay (HOSPITAL_COMMUNITY)
Admission: EM | Admit: 2023-06-14 | Discharge: 2023-06-16 | DRG: 092 | Discharge status: Against Medical Advice | Attending: Internal Medicine | Admitting: Internal Medicine

## 2023-06-14 ENCOUNTER — Encounter (HOSPITAL_COMMUNITY): Payer: Self-pay | Admitting: Emergency Medicine

## 2023-06-14 DIAGNOSIS — I2510 Atherosclerotic heart disease of native coronary artery without angina pectoris: Secondary | ICD-10-CM | POA: Diagnosis present

## 2023-06-14 DIAGNOSIS — G901 Familial dysautonomia [Riley-Day]: Secondary | ICD-10-CM | POA: Diagnosis not present

## 2023-06-14 DIAGNOSIS — Z91030 Bee allergy status: Secondary | ICD-10-CM

## 2023-06-14 DIAGNOSIS — R451 Restlessness and agitation: Secondary | ICD-10-CM | POA: Diagnosis present

## 2023-06-14 DIAGNOSIS — F32A Depression, unspecified: Secondary | ICD-10-CM | POA: Diagnosis present

## 2023-06-14 DIAGNOSIS — R55 Syncope and collapse: Principal | ICD-10-CM

## 2023-06-14 DIAGNOSIS — Z818 Family history of other mental and behavioral disorders: Secondary | ICD-10-CM

## 2023-06-14 DIAGNOSIS — Z881 Allergy status to other antibiotic agents status: Secondary | ICD-10-CM

## 2023-06-14 DIAGNOSIS — Z5986 Financial insecurity: Secondary | ICD-10-CM

## 2023-06-14 DIAGNOSIS — Z888 Allergy status to other drugs, medicaments and biological substances status: Secondary | ICD-10-CM

## 2023-06-14 DIAGNOSIS — Z86718 Personal history of other venous thrombosis and embolism: Secondary | ICD-10-CM

## 2023-06-14 DIAGNOSIS — Z7902 Long term (current) use of antithrombotics/antiplatelets: Secondary | ICD-10-CM

## 2023-06-14 DIAGNOSIS — I252 Old myocardial infarction: Secondary | ICD-10-CM

## 2023-06-14 DIAGNOSIS — I7774 Dissection of vertebral artery: Secondary | ICD-10-CM | POA: Diagnosis present

## 2023-06-14 DIAGNOSIS — Z8261 Family history of arthritis: Secondary | ICD-10-CM

## 2023-06-14 DIAGNOSIS — R002 Palpitations: Secondary | ICD-10-CM

## 2023-06-14 DIAGNOSIS — I493 Ventricular premature depolarization: Secondary | ICD-10-CM | POA: Diagnosis present

## 2023-06-14 DIAGNOSIS — Z811 Family history of alcohol abuse and dependence: Secondary | ICD-10-CM

## 2023-06-14 DIAGNOSIS — F4310 Post-traumatic stress disorder, unspecified: Secondary | ICD-10-CM | POA: Diagnosis present

## 2023-06-14 DIAGNOSIS — Z820 Family history of epilepsy and other diseases of the nervous system: Secondary | ICD-10-CM

## 2023-06-14 DIAGNOSIS — R079 Chest pain, unspecified: Secondary | ICD-10-CM | POA: Diagnosis present

## 2023-06-14 DIAGNOSIS — F319 Bipolar disorder, unspecified: Secondary | ICD-10-CM | POA: Diagnosis present

## 2023-06-14 DIAGNOSIS — Z825 Family history of asthma and other chronic lower respiratory diseases: Secondary | ICD-10-CM

## 2023-06-14 DIAGNOSIS — Z793 Long term (current) use of hormonal contraceptives: Secondary | ICD-10-CM

## 2023-06-14 DIAGNOSIS — Z91018 Allergy to other foods: Secondary | ICD-10-CM

## 2023-06-14 DIAGNOSIS — G909 Disorder of the autonomic nervous system, unspecified: Secondary | ICD-10-CM | POA: Diagnosis present

## 2023-06-14 DIAGNOSIS — Z7951 Long term (current) use of inhaled steroids: Secondary | ICD-10-CM

## 2023-06-14 DIAGNOSIS — A439 Nocardiosis, unspecified: Secondary | ICD-10-CM | POA: Diagnosis present

## 2023-06-14 DIAGNOSIS — Z8249 Family history of ischemic heart disease and other diseases of the circulatory system: Secondary | ICD-10-CM

## 2023-06-14 DIAGNOSIS — Z8679 Personal history of other diseases of the circulatory system: Secondary | ICD-10-CM

## 2023-06-14 DIAGNOSIS — Z79899 Other long term (current) drug therapy: Secondary | ICD-10-CM

## 2023-06-14 DIAGNOSIS — Z821 Family history of blindness and visual loss: Secondary | ICD-10-CM

## 2023-06-14 DIAGNOSIS — F17210 Nicotine dependence, cigarettes, uncomplicated: Secondary | ICD-10-CM | POA: Diagnosis present

## 2023-06-14 DIAGNOSIS — G8929 Other chronic pain: Secondary | ICD-10-CM | POA: Diagnosis present

## 2023-06-14 DIAGNOSIS — R112 Nausea with vomiting, unspecified: Secondary | ICD-10-CM | POA: Diagnosis present

## 2023-06-14 DIAGNOSIS — I10 Essential (primary) hypertension: Secondary | ICD-10-CM | POA: Diagnosis present

## 2023-06-14 DIAGNOSIS — A438 Other forms of nocardiosis: Secondary | ICD-10-CM | POA: Diagnosis present

## 2023-06-14 DIAGNOSIS — J45909 Unspecified asthma, uncomplicated: Secondary | ICD-10-CM | POA: Diagnosis present

## 2023-06-14 DIAGNOSIS — Z91041 Radiographic dye allergy status: Secondary | ICD-10-CM

## 2023-06-14 DIAGNOSIS — Z8673 Personal history of transient ischemic attack (TIA), and cerebral infarction without residual deficits: Secondary | ICD-10-CM

## 2023-06-14 DIAGNOSIS — I4891 Unspecified atrial fibrillation: Secondary | ICD-10-CM | POA: Diagnosis present

## 2023-06-14 DIAGNOSIS — Z841 Family history of disorders of kidney and ureter: Secondary | ICD-10-CM

## 2023-06-14 DIAGNOSIS — F419 Anxiety disorder, unspecified: Secondary | ICD-10-CM | POA: Diagnosis present

## 2023-06-14 DIAGNOSIS — I4729 Other ventricular tachycardia: Secondary | ICD-10-CM

## 2023-06-14 DIAGNOSIS — Z833 Family history of diabetes mellitus: Secondary | ICD-10-CM

## 2023-06-14 DIAGNOSIS — Z5982 Transportation insecurity: Secondary | ICD-10-CM

## 2023-06-14 DIAGNOSIS — A319 Mycobacterial infection, unspecified: Secondary | ICD-10-CM | POA: Diagnosis present

## 2023-06-14 DIAGNOSIS — I472 Ventricular tachycardia, unspecified: Secondary | ICD-10-CM | POA: Diagnosis present

## 2023-06-14 DIAGNOSIS — R1013 Epigastric pain: Secondary | ICD-10-CM | POA: Diagnosis present

## 2023-06-14 DIAGNOSIS — Z7982 Long term (current) use of aspirin: Secondary | ICD-10-CM

## 2023-06-14 DIAGNOSIS — Z792 Long term (current) use of antibiotics: Secondary | ICD-10-CM

## 2023-06-14 DIAGNOSIS — M419 Scoliosis, unspecified: Secondary | ICD-10-CM | POA: Diagnosis present

## 2023-06-14 DIAGNOSIS — Z8349 Family history of other endocrine, nutritional and metabolic diseases: Secondary | ICD-10-CM

## 2023-06-14 DIAGNOSIS — Z813 Family history of other psychoactive substance abuse and dependence: Secondary | ICD-10-CM

## 2023-06-14 DIAGNOSIS — Z810 Family history of intellectual disabilities: Secondary | ICD-10-CM

## 2023-06-14 DIAGNOSIS — Z5329 Procedure and treatment not carried out because of patient's decision for other reasons: Secondary | ICD-10-CM | POA: Diagnosis present

## 2023-06-14 DIAGNOSIS — Z823 Family history of stroke: Secondary | ICD-10-CM

## 2023-06-14 LAB — BASIC METABOLIC PANEL WITH GFR
Anion gap: 9 (ref 5–15)
BUN: 10 mg/dL (ref 6–20)
CO2: 21 mmol/L — ABNORMAL LOW (ref 22–32)
Calcium: 9 mg/dL (ref 8.9–10.3)
Chloride: 106 mmol/L (ref 98–111)
Creatinine, Ser: 0.8 mg/dL (ref 0.44–1.00)
GFR, Estimated: >60 mL/min (ref >60)
Glucose, Bld: 114 mg/dL — ABNORMAL HIGH (ref 70–99)
Potassium: 3.6 mmol/L (ref 3.5–5.1)
Sodium: 136 mmol/L (ref 135–145)

## 2023-06-14 LAB — URINALYSIS, ROUTINE W REFLEX MICROSCOPIC
Bilirubin Urine: NEGATIVE
Glucose, UA: NEGATIVE mg/dL
Hgb urine dipstick: NEGATIVE
Ketones, ur: NEGATIVE mg/dL
Leukocytes,Ua: NEGATIVE
Nitrite: NEGATIVE
Protein, ur: NEGATIVE mg/dL
Specific Gravity, Urine: 1.006 (ref 1.005–1.030)
pH: 7 (ref 5.0–8.0)

## 2023-06-14 LAB — CBC
HCT: 34.4 % — ABNORMAL LOW (ref 36.0–46.0)
Hemoglobin: 11.3 g/dL — ABNORMAL LOW (ref 12.0–15.0)
MCH: 29.5 pg (ref 26.0–34.0)
MCHC: 32.8 g/dL (ref 30.0–36.0)
MCV: 89.8 fL (ref 80.0–100.0)
Platelets: 209 10*3/uL (ref 150–400)
RBC: 3.83 MIL/uL — ABNORMAL LOW (ref 3.87–5.11)
RDW: 11.6 % (ref 11.5–15.5)
WBC: 5.8 10*3/uL (ref 4.0–10.5)
nRBC: 0 % (ref 0.0–0.2)

## 2023-06-14 LAB — TROPONIN I (HIGH SENSITIVITY)
Troponin I (High Sensitivity): 6 ng/L (ref <18)
Troponin I (High Sensitivity): 15 ng/L (ref <18)

## 2023-06-14 LAB — MAGNESIUM: Magnesium: 1.8 mg/dL (ref 1.7–2.4)

## 2023-06-14 LAB — D-DIMER, QUANTITATIVE: D-Dimer, Quant: <0.27 ug{FEU}/mL (ref 0.00–0.50)

## 2023-06-14 LAB — CBG MONITORING, ED: Glucose-Capillary: 104 mg/dL — ABNORMAL HIGH (ref 70–99)

## 2023-06-14 LAB — POC URINE PREG, ED: Preg Test, Ur: NEGATIVE

## 2023-06-14 LAB — TSH: TSH: 3.59 u[IU]/mL (ref 0.350–4.500)

## 2023-06-14 MED ORDER — CEFOXITIN SODIUM 2 G IV SOLR: 2.0000 g | Freq: Every day | INTRAVENOUS | Status: DC

## 2023-06-14 MED ORDER — SODIUM CHLORIDE 0.9 % IV SOLN
1.0000 g | Freq: Three times a day (TID) | INTRAVENOUS | Status: DC
  Administered 2023-06-14 – 2023-06-16 (×7): 1 g via INTRAVENOUS
  Filled 2023-06-14 (×8): qty 20

## 2023-06-14 MED ORDER — ACETAMINOPHEN 325 MG PO TABS
650.0000 mg | ORAL_TABLET | Freq: Four times a day (QID) | ORAL | Status: DC | PRN
  Filled 2023-06-14: qty 2

## 2023-06-14 MED ORDER — BACLOFEN 10 MG PO TABS
20.0000 mg | ORAL_TABLET | ORAL | Status: DC | PRN
  Administered 2023-06-15: 20 mg via ORAL
  Filled 2023-06-14: qty 2

## 2023-06-14 MED ORDER — ACETAMINOPHEN 650 MG RE SUPP: 650.0000 mg | Freq: Four times a day (QID) | RECTAL | Status: DC | PRN

## 2023-06-14 MED ORDER — CLOPIDOGREL BISULFATE 75 MG PO TABS
75.0000 mg | ORAL_TABLET | Freq: Every day | ORAL | Status: DC
  Administered 2023-06-14 – 2023-06-16 (×3): 75 mg via ORAL
  Filled 2023-06-14 (×3): qty 1

## 2023-06-14 MED ORDER — ALPRAZOLAM 0.25 MG PO TABS
0.2500 mg | ORAL_TABLET | Freq: Three times a day (TID) | ORAL | Status: DC | PRN
  Administered 2023-06-14: 0.25 mg via ORAL
  Filled 2023-06-14: qty 1

## 2023-06-14 MED ORDER — PREDNISONE 10 MG PO TABS
5.0000 mg | ORAL_TABLET | Freq: Every day | ORAL | Status: DC
  Administered 2023-06-14 – 2023-06-16 (×3): 5 mg via ORAL
  Filled 2023-06-14 (×3): qty 1

## 2023-06-14 MED ORDER — DIPHENHYDRAMINE HCL 50 MG/ML IJ SOLN
50.0000 mg | Freq: Once | INTRAMUSCULAR | Status: DC
  Filled 2023-06-14: qty 1

## 2023-06-14 MED ORDER — METOCLOPRAMIDE HCL 10 MG PO TABS
5.0000 mg | ORAL_TABLET | Freq: Three times a day (TID) | ORAL | Status: DC
  Administered 2023-06-14 – 2023-06-16 (×6): 5 mg via ORAL
  Filled 2023-06-14 (×6): qty 1

## 2023-06-14 MED ORDER — ENOXAPARIN SODIUM 40 MG/0.4ML IJ SOSY
40.0000 mg | PREFILLED_SYRINGE | INTRAMUSCULAR | Status: DC
  Filled 2023-06-14 (×3): qty 0.4

## 2023-06-14 MED ORDER — DIPHENHYDRAMINE HCL 25 MG PO CAPS
50.0000 mg | ORAL_CAPSULE | Freq: Once | ORAL | Status: DC
Start: 2023-06-14 — End: 2023-06-14
  Filled 2023-06-14: qty 2

## 2023-06-14 MED ORDER — METHYLPREDNISOLONE SODIUM SUCC 40 MG IJ SOLR
40.0000 mg | Freq: Once | INTRAMUSCULAR | Status: DC
Start: 2023-06-14 — End: 2023-06-14
  Filled 2023-06-14: qty 1

## 2023-06-14 MED ORDER — CHLORHEXIDINE GLUCONATE CLOTH 2 % EX PADS
6.0000 | MEDICATED_PAD | Freq: Every day | CUTANEOUS | Status: DC
  Administered 2023-06-14 – 2023-06-16 (×3): 6 via TOPICAL

## 2023-06-14 MED ORDER — SODIUM CHLORIDE 0.9% FLUSH
10.0000 mL | INTRAVENOUS | Status: DC | PRN
  Administered 2023-06-14: 10 mL

## 2023-06-14 MED ORDER — MORPHINE SULFATE (PF) 4 MG/ML IV SOLN
4.0000 mg | Freq: Once | INTRAVENOUS | Status: AC
  Administered 2023-06-14: 4 mg via INTRAVENOUS
  Filled 2023-06-14: qty 1

## 2023-06-14 MED ORDER — GABAPENTIN 400 MG PO CAPS
400.0000 mg | ORAL_CAPSULE | Freq: Two times a day (BID) | ORAL | Status: DC
  Administered 2023-06-14 – 2023-06-16 (×4): 400 mg via ORAL
  Filled 2023-06-14 (×6): qty 1

## 2023-06-14 MED ORDER — SODIUM CHLORIDE 0.9% FLUSH
10.0000 mL | Freq: Two times a day (BID) | INTRAVENOUS | Status: DC
  Administered 2023-06-14: 10 mL
  Administered 2023-06-15: 20 mL
  Administered 2023-06-15 – 2023-06-16 (×2): 10 mL

## 2023-06-14 MED ORDER — PROMETHAZINE HCL 12.5 MG PO TABS
25.0000 mg | ORAL_TABLET | Freq: Four times a day (QID) | ORAL | Status: DC | PRN
  Administered 2023-06-15: 25 mg via ORAL
  Filled 2023-06-14: qty 2

## 2023-06-14 MED ORDER — FLUTICASONE FUROATE-VILANTEROL 100-25 MCG/ACT IN AEPB
1.0000 | INHALATION_SPRAY | Freq: Every day | RESPIRATORY_TRACT | Status: DC
  Administered 2023-06-15 – 2023-06-16 (×2): 1 via RESPIRATORY_TRACT
  Filled 2023-06-14: qty 28

## 2023-06-14 MED ORDER — SULFAMETHOXAZOLE-TRIMETHOPRIM 800-160 MG PO TABS
1.0000 | ORAL_TABLET | Freq: Two times a day (BID) | ORAL | Status: DC
  Administered 2023-06-15 – 2023-06-16 (×2): 1 via ORAL
  Filled 2023-06-14 (×6): qty 1

## 2023-06-14 MED ORDER — AZITHROMYCIN 250 MG PO TABS
250.0000 mg | ORAL_TABLET | Freq: Every day | ORAL | Status: DC
  Administered 2023-06-14 – 2023-06-16 (×3): 250 mg via ORAL
  Filled 2023-06-14 (×3): qty 1

## 2023-06-14 NOTE — ED Notes (Signed)
 Pt refused to have US done. Dr. Sherryll Burger notified.

## 2023-06-14 NOTE — ED Triage Notes (Signed)
 Pt bib EMS for c/o dizziness. Per EMS, pt with recent CVA and pt on both Plavix and ASA daily. Pt also "being worked up for arrhythmia but they haven't been able to catch". EMS reports that pt showed BBB then A-Fib then NSR. CBG reported to be 101 by EMS.
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
## 2023-06-14 NOTE — ED Provider Notes (Signed)
 [place] EMERGENCY DEPARTMENT AT [facility] Provider Note   CSN: [id number] Arrival date & time: 06/14/23  0138     History  Chief Complaint  Patient presents with   Dizziness    Traci Hensley is a 33 y.o. female with PMH as listed below who presents bib EMS for c/o dizziness, palpitations, syncope, and chest pain.  Patient reports a several month history since the end of January 2025 with palpitations and intermittent arrhythmias.  She has been following with a cardiologist and recently utilized a cardia heart monitor but has not been seen yet for the results.  She states that her cardiologist wants her to see a specialist at [facility] who specializes nose and her dysautonomia but that physician is not currently excepting patients and patient is very frustrated.  Patient also with recent CVA and pt on both Plavix and ASA daily. 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 Patient states that her symptoms are worsening over the last several weeks and the point that they are daily and she often falls and experiences near syncopal or syncopal episodes.  She currently has central chest pain that is very severe and worse with deep breaths.  She also reports approximately 1 week of left leg swelling and soreness.  She does have a history of DVT but in the right lower extremity.  EMS reports that pt showed "BBB then A-Fib then NSR". CBG reported to be 101 by EMS.   Past Medical History:  Diagnosis Date   Agoraphobia    Allergy    Anxiety    Arthritis    Asthma    B12 deficiency anemia    Bipolar 1 disorder (HCC)    Chronic abdominal pain    Chronic chest pain    Depression    Dystonia    Unknown which type   Hypertension    Myocardial infarction [facility]) 2013   Neuromuscular disorder (HCC)    Neuropathy   Oxygen deficiency    PTSD (post-traumatic stress disorder)    Scoliosis    Skin lesions 05/13/2023   Stroke [facility])    Vertebral artery  dissection (HCC) 11/03/2022       Home Medications Prior to Admission medications   Medication Sig Start Date End Date Taking? Authorizing Provider  acetaminophen (TYLENOL) 500 MG tablet Take 2,000-2,500 mg by mouth every 6 (six) hours as needed for mild pain (pain score 1-3).    [provider]  albuterol (VENTOLIN HFA) 108 (90 Base) MCG/ACT inhaler Inhale 2 puffs into the lungs every 6 (six) hours as needed for wheezing or shortness of breath. 04/24/23   Milian, Winda Hastings, FNP  azithromycin (ZITHROMAX) 250 MG tablet Take 1 tablet (250 mg total) by mouth daily. 04/21/23 10/18/23  Vu, Martie Slaughter, MD  baclofen (LIORESAL) 20 MG tablet Take 1 tablet (20 mg total) by mouth as needed for muscle spasms. 04/24/23   Milian, Winda Hastings, FNP  BREO ELLIPTA 100-25 MCG/ACT AEPB Inhale 1 puff into the lungs daily. 04/24/23   Milian, Winda Hastings, FNP  cefOXitin (MEFOXIN) 2 g injection Inject into the vein. 05/19/23   [provider]  clopidogrel (PLAVIX) 75 MG tablet Take 1 tablet (75 mg total) by mouth daily. 04/24/23   Milian, Winda Hastings, FNP  cyanocobalamin (VITAMIN B12) 1000 MCG/ML injection INJECT 1 ML INTRAMUSCULARLY EVERY OTHER WEEK 04/24/23   Milian, Winda Hastings, FNP  EPINEPHrine 0.3 mg/0.3 mL IJ SOAJ injection Inject 0.3 mg into the  muscle as needed.    [provider]  ergocalciferol (VITAMIN D2) 1.25 MG (50000 UT) capsule Take 50,000 Units by mouth once a week.    [provider]  gabapentin (NEURONTIN) 400 MG capsule Take 1 capsule (400 mg total) by mouth 2 (two) times daily. 04/24/23   Milian, Winda Hastings, FNP  hydrALAZINE (APRESOLINE) 10 MG tablet Take 0.5 tablets (5 mg total) by mouth as needed (BP over 140 mmhg). 05/21/23   Mallipeddi, Vishnu P, MD  ibuprofen (ADVIL) 200 MG tablet Take 1,600 mg by mouth every 6 (six) hours as needed for mild pain (pain score 1-3).    [provider]  levalbuterol Bernhard Brine HFA) 45 MCG/ACT inhaler      [provider]  metoCLOPramide (REGLAN) 5 MG tablet Take 1 tablet (5 mg total) by mouth 3 (three) times daily before meals. 05/14/23   Albertha Huger, FNP  mupirocin ointment (BACTROBAN) 2 % Apply to skin rash on leg/ankle once daily. 03/03/23   McCaughan, Dia D, DPM  nitroGLYCERIN (NITROSTAT) 0.4 MG SL tablet Place 1 tablet (0.4 mg total) under the tongue every 5 (five) minutes as needed for chest pain. 06/17/14   Hugh Madura, MD  NONFORMULARY OR COMPOUNDED ITEM Apply 1 application  topically 4 (four) times daily. Katamine/Gabapentin/Baclofen/Lidocaine/Menthol Cream    [provider]  norethindrone-ethinyl estradiol (NORTREL 0.5/35, 28,) 0.5-35 MG-MCG tablet Take 1 tablet by mouth daily. 04/24/23   Milian, Winda Hastings, FNP  olmesartan (BENICAR) 20 MG tablet Take 1 tablet (20 mg total) by mouth daily. 04/24/23   Milian, Winda Hastings, FNP  ondansetron (ZOFRAN-ODT) 8 MG disintegrating tablet Take 1 tablet (8 mg total) by mouth as needed for nausea or vomiting. 04/24/23   Milian, Winda Hastings, FNP  predniSONE (DELTASONE) 5 MG tablet Take 1 tablet (5 mg total) by mouth daily. 04/02/23   Milian, Winda Hastings, FNP  promethazine (PHENERGAN) 25 MG tablet Take 1 tablet (25 mg total) by mouth every 6 (six) hours as needed for nausea or vomiting. Would take one hour prior to taking antibiotics. Try to see if this works better than zofran for nausea 05/14/23   Ernie Heal, Jerelyn Money, MD  sulfamethoxazole-trimethoprim (BACTRIM DS) 800-160 MG tablet Take 1 tablet by mouth 2 (two) times daily. 04/21/23 10/18/23  Vu, Garnette Ka T, MD  triamcinolone cream (KENALOG) 0.1 % Apply 1 Application topically daily. 03/17/23   [provider]      Allergies    Adhesive [tape], Blue dyes (parenteral), Contrast media [iodinated contrast media], Corn-containing products, Green dyes, Nickel, Omnipaque [iohexol], Onion, Other, Petroleum distillate, Silicone, Bee venom, Cortisporin-tc  [neomycin-colist-hc-thonzonium], and Yellow jacket venom    Review of Systems   Review of Systems A 10 point review of systems was performed and is negative unless otherwise reported in HPI.  Physical Exam Updated Vital Signs BP 119/70   Pulse 87   Temp 98 F (36.7 C) (Oral)   Resp 17   Ht 5' (1.524 m)   Wt 55 kg   SpO2 100%   BMI 23.68 kg/m  Physical Exam General: Normal appearing female, lying in bed.  HEENT: PERRLA, Sclera anicteric, MMM, trachea midline.  Cardiology: RRR, no murmurs/rubs/gallops. BL radial and DP pulses equal bilaterally.  Resp: Normal respiratory rate and effort. CTAB, no wheezes, rhonchi, crackles.  Abd: Soft, non-tender, non-distended. No rebound tenderness or guarding.  GU: Deferred. MSK: + Left lower extremity edema and mild tenderness in the calf.  No swelling in the right lower  extremity.. Extremities without deformity. No cyanosis or clubbing. Skin: warm, dry.  Neuro: A&Ox4, CNs II-XII grossly intact. MAEs. Sensation grossly intact.  Psych: Normal mood and affect.   ED Results / Procedures / Treatments   Labs (all labs ordered are listed, but only abnormal results are displayed) Labs Reviewed  BASIC METABOLIC PANEL WITH GFR - Abnormal; Notable for the following components:      Result Value   CO2 21 (*)    Glucose, Bld 114 (*)    All other components within normal limits  CBC - Abnormal; Notable for the following components:   RBC 3.83 (*)    Hemoglobin 11.3 (*)    HCT 34.4 (*)    All other components within normal limits  URINALYSIS, ROUTINE W REFLEX MICROSCOPIC - Abnormal; Notable for the following components:   Color, Urine COLORLESS (*)    All other components within normal limits  CBG MONITORING, ED - Abnormal; Notable for the following components:   Glucose-Capillary 104 (*)    All other components within normal limits  MAGNESIUM  D-DIMER, QUANTITATIVE  POC URINE PREG, ED    EKG None  Radiology No results  found.  Procedures Procedures    Medications Ordered in ED Medications  morphine (PF) 4 MG/ML injection 4 mg (has no administration in time range)    ED Course/ Medical Decision Making/ A&P                          Medical Decision Making Amount and/or Complexity of Data Reviewed Labs: ordered. Decision-making details documented in ED Course.  Risk Prescription drug management. Decision regarding hospitalization.    This patient presents to the ED for concern of syncope, palpitations, dizziness, this involves an extensive number of treatment options, and is a complaint that carries with it a high risk of complications and morbidity.  I considered the following differential and admission for this acute, potentially life threatening condition.   MDM:    Intermittent nonsustained VT noted on patient's telemetry strips. No significant electrolyte derangements.  Consider DVT in the left lower extremity and patient will need an ultrasound of this.  D-dimer negative.  Given her chest pain that is pleuritic, arrhythmias and palpitations, as well as left lower extremity edema with history of DVT, considered obtaining CTA to rule out PE, but patient has contrast allergy, and she would prefer not to have CT done. Dimer negative not fool proof but is reassuring. Patient does report syncopal events and presyncopal events a home, stats she lives with her boyfriend's mother who doesn't help with her and she is very worried about going home alone. Her symptoms do seem to be worsening, and she hasn't followed up for her dysautonomia for various reasons, but has no follow-up currently scheduled and cannot take beta blockers.   Clinical Course as of 06/23/23 1557  Sat Jun 14, 2023  0354 Reportedly has contrast allergy. On further investigation it appears it is only to oral contrast but I cannot see that she has tolerated IV contrast before. Will give prep to obtain CTA chest. Per chart review, patient  has tolerated both methylprednisolone and benadryl in the past.  [HN]  0433 D-Dimer, Quant: <0.27 Reassuringly negative [HN]  0434 D/w cardiology Dr. Ossie Blend who states he would be hesitant to give metoprolol given that per Dr. Learta Prost note, she has failed propranolol in the past d/t hypotension. Will need to be seen by someone who can manage dysautonomia  which she is already trying to do. Cardiology states nothing to offer acutely in the ED. Discussed recurrent falls/syncope at home and likely will require medical admission. Cardiology can see the patient inpatient.  [HN]    Clinical Course User Index [HN] Merdis Stalling, MD    Labs: I Ordered, and personally interpreted labs.  The pertinent results include:  those listed above  Imaging Studies ordered: I ordered imaging studies including CTA I independently visualized and interpreted imaging. I agree with the radiologist interpretation  Additional history obtained from chart review, boyfriend at bedside.   Cardiac Monitoring: The patient was maintained on a cardiac monitor.  I personally viewed and interpreted the cardiac monitored which showed an underlying rhythm of: NSR, intermittent NSVT  Reevaluation: After the interventions noted above, I reevaluated the patient and found that they have :stayed the same  Social Determinants of Health: Lives independently  Disposition:  Admit to hospitalist w/ cards following  Co morbidities that complicate the patient evaluation  Past Medical History:  Diagnosis Date   Agoraphobia    Allergy    Anxiety    Arthritis    Asthma    B12 deficiency anemia    Bipolar 1 disorder (HCC)    Chronic abdominal pain    Chronic chest pain    Depression    Dystonia    Unknown which type   Hypertension    Myocardial infarction [facility]) 2013   Neuromuscular disorder (HCC)    Neuropathy   Oxygen deficiency    PTSD (post-traumatic stress disorder)    Scoliosis    Skin lesions 05/13/2023    Stroke (HCC)    Vertebral artery dissection (HCC) 11/03/2022     Medicines Meds ordered this encounter  Medications   morphine (PF) 4 MG/ML injection 4 mg    Refill:  0    I have reviewed the patients home medicines and have made adjustments as needed  Problem List / ED Course: Problem List Items Addressed This Visit       Other   Nocardia infection (Chronic)   Relevant Medications   cefOXitin (MEFOXIN) 2 g injection   Other Relevant Orders   Home infusion instructions - [facility]   Method of administration may be changed at the discretion of home infusion pharmacist based upon assessment of the patient and/or caregiver's ability to self-administer the medication ordered   Anaphylaxis Kit: Provided to treat any anaphylactic reaction to the medication being provided to the patient if First Dose or when requested by physician   Flush IV access with Sodium Chloride 0.9% and Heparin 10 units/ml or 100 units/ml   [facility] to provide Cath Flo 2mg    [facility] pharmacist to adjust dose for Vancomycin, Aminoglycosides and other anti-infective therapies as requested by physician.   Outpatient Parenteral Antibiotic Therapy Information Antibiotic: Other; Comments: cefoxitin; Indications for use: NDM/nocardia leg infection; End Date: 07/24/2023   Palpitations   Other Visit Diagnoses       Recurrent syncope    -  Primary     Nonsustained ventricular tachycardia (HCC)                       This note was created using dictation software, which may contain spelling or grammatical errors.    Merdis Stalling, MD 06/23/23 [phone]

## 2023-06-14 NOTE — Plan of Care (Signed)
  Problem: Education: Goal: Knowledge of General Education information will improve Description: Including pain rating scale, medication(s)/side effects and non-pharmacologic comfort measures Outcome: Progressing   Problem: Clinical Measurements: Goal: Will remain free from infection Outcome: Progressing   Problem: Clinical Measurements: Goal: Diagnostic test results will improve Outcome: Progressing   Problem: Clinical Measurements: Goal: Cardiovascular complication will be avoided Outcome: Progressing

## 2023-06-14 NOTE — ED Notes (Signed)
 Pt spouse to desk multiple times in reference to cardiac alarms. Alarms reviewed, multiple PVCs noted. Alarm strips printed and reviewed. Pt endorses feeling light headed and weak during these episodes. EDP informed.

## 2023-06-14 NOTE — ED Notes (Signed)
 Pt states she doesn't get undressed in front of a man, and that its supposed to be in her chart.

## 2023-06-14 NOTE — ED Notes (Signed)
 ED Provider at bedside.

## 2023-06-15 DIAGNOSIS — F17210 Nicotine dependence, cigarettes, uncomplicated: Secondary | ICD-10-CM | POA: Diagnosis present

## 2023-06-15 DIAGNOSIS — A319 Mycobacterial infection, unspecified: Secondary | ICD-10-CM | POA: Diagnosis present

## 2023-06-15 DIAGNOSIS — G901 Familial dysautonomia [Riley-Day]: Secondary | ICD-10-CM | POA: Diagnosis present

## 2023-06-15 DIAGNOSIS — Z8673 Personal history of transient ischemic attack (TIA), and cerebral infarction without residual deficits: Secondary | ICD-10-CM | POA: Diagnosis not present

## 2023-06-15 DIAGNOSIS — Z8249 Family history of ischemic heart disease and other diseases of the circulatory system: Secondary | ICD-10-CM | POA: Diagnosis not present

## 2023-06-15 DIAGNOSIS — Z881 Allergy status to other antibiotic agents status: Secondary | ICD-10-CM | POA: Diagnosis not present

## 2023-06-15 DIAGNOSIS — R079 Chest pain, unspecified: Secondary | ICD-10-CM | POA: Diagnosis not present

## 2023-06-15 DIAGNOSIS — F4310 Post-traumatic stress disorder, unspecified: Secondary | ICD-10-CM | POA: Diagnosis present

## 2023-06-15 DIAGNOSIS — I472 Ventricular tachycardia, unspecified: Secondary | ICD-10-CM | POA: Diagnosis present

## 2023-06-15 DIAGNOSIS — A438 Other forms of nocardiosis: Secondary | ICD-10-CM | POA: Diagnosis present

## 2023-06-15 DIAGNOSIS — Z91030 Bee allergy status: Secondary | ICD-10-CM | POA: Diagnosis not present

## 2023-06-15 DIAGNOSIS — I2510 Atherosclerotic heart disease of native coronary artery without angina pectoris: Secondary | ICD-10-CM | POA: Diagnosis present

## 2023-06-15 DIAGNOSIS — R002 Palpitations: Secondary | ICD-10-CM | POA: Diagnosis present

## 2023-06-15 DIAGNOSIS — Z888 Allergy status to other drugs, medicaments and biological substances status: Secondary | ICD-10-CM | POA: Diagnosis not present

## 2023-06-15 DIAGNOSIS — R42 Dizziness and giddiness: Secondary | ICD-10-CM | POA: Diagnosis not present

## 2023-06-15 DIAGNOSIS — F319 Bipolar disorder, unspecified: Secondary | ICD-10-CM | POA: Diagnosis present

## 2023-06-15 DIAGNOSIS — Z91041 Radiographic dye allergy status: Secondary | ICD-10-CM | POA: Diagnosis not present

## 2023-06-15 DIAGNOSIS — I252 Old myocardial infarction: Secondary | ICD-10-CM | POA: Diagnosis not present

## 2023-06-15 DIAGNOSIS — J45909 Unspecified asthma, uncomplicated: Secondary | ICD-10-CM | POA: Diagnosis present

## 2023-06-15 DIAGNOSIS — Z91018 Allergy to other foods: Secondary | ICD-10-CM | POA: Diagnosis not present

## 2023-06-15 DIAGNOSIS — I10 Essential (primary) hypertension: Secondary | ICD-10-CM | POA: Diagnosis present

## 2023-06-15 DIAGNOSIS — Z818 Family history of other mental and behavioral disorders: Secondary | ICD-10-CM | POA: Diagnosis not present

## 2023-06-15 DIAGNOSIS — Z86718 Personal history of other venous thrombosis and embolism: Secondary | ICD-10-CM | POA: Diagnosis not present

## 2023-06-15 DIAGNOSIS — I4891 Unspecified atrial fibrillation: Secondary | ICD-10-CM | POA: Diagnosis present

## 2023-06-15 DIAGNOSIS — Z5329 Procedure and treatment not carried out because of patient's decision for other reasons: Secondary | ICD-10-CM | POA: Diagnosis present

## 2023-06-15 DIAGNOSIS — F419 Anxiety disorder, unspecified: Secondary | ICD-10-CM | POA: Diagnosis present

## 2023-06-15 DIAGNOSIS — G8929 Other chronic pain: Secondary | ICD-10-CM | POA: Diagnosis present

## 2023-06-15 DIAGNOSIS — R9431 Abnormal electrocardiogram [ECG] [EKG]: Secondary | ICD-10-CM | POA: Diagnosis not present

## 2023-06-15 LAB — MAGNESIUM: Magnesium: 1.7 mg/dL (ref 1.7–2.4)

## 2023-06-15 LAB — BASIC METABOLIC PANEL WITH GFR
Anion gap: 10 (ref 5–15)
BUN: 11 mg/dL (ref 6–20)
CO2: 24 mmol/L (ref 22–32)
Calcium: 9.3 mg/dL (ref 8.9–10.3)
Chloride: 103 mmol/L (ref 98–111)
Creatinine, Ser: 0.68 mg/dL (ref 0.44–1.00)
GFR, Estimated: >60 mL/min (ref >60)
Glucose, Bld: 88 mg/dL (ref 70–99)
Potassium: 3.5 mmol/L (ref 3.5–5.1)
Sodium: 137 mmol/L (ref 135–145)

## 2023-06-15 LAB — CBC
HCT: 36.8 % (ref 36.0–46.0)
Hemoglobin: 12.1 g/dL (ref 12.0–15.0)
MCH: 29.4 pg (ref 26.0–34.0)
MCHC: 32.9 g/dL (ref 30.0–36.0)
MCV: 89.5 fL (ref 80.0–100.0)
Platelets: 207 10*3/uL (ref 150–400)
RBC: 4.11 MIL/uL (ref 3.87–5.11)
RDW: 11.5 % (ref 11.5–15.5)
WBC: 4.7 10*3/uL (ref 4.0–10.5)
nRBC: 0 % (ref 0.0–0.2)

## 2023-06-15 MED ORDER — POTASSIUM CHLORIDE CRYS ER 20 MEQ PO TBCR
40.0000 meq | EXTENDED_RELEASE_TABLET | Freq: Once | ORAL | Status: AC
  Administered 2023-06-15: 40 meq via ORAL
  Filled 2023-06-15: qty 2

## 2023-06-15 MED ORDER — MAGNESIUM SULFATE 2 GM/50ML IV SOLN
2.0000 g | Freq: Once | INTRAVENOUS | Status: AC
  Administered 2023-06-15: 2 g via INTRAVENOUS
  Filled 2023-06-15: qty 50

## 2023-06-15 NOTE — Plan of Care (Signed)
  Problem: Education: Goal: Knowledge of General Education information will improve Description: Including pain rating scale, medication(s)/side effects and non-pharmacologic comfort measures Outcome: Progressing   Problem: Clinical Measurements: Goal: Ability to maintain clinical measurements within normal limits will improve Outcome: Progressing   Problem: Nutrition: Goal: Adequate nutrition will be maintained Outcome: Progressing   Problem: Elimination: Goal: Will not experience complications related to bowel motility Outcome: Progressing Goal: Will not experience complications related to urinary retention Outcome: Progressing

## 2023-06-15 NOTE — Progress Notes (Signed)
 Pt became agitated and angry around 1000pm, pt woke up wanting to know what we were doing for her? Pts brother also asked what was the plan? Both pt and brother were asking was she just here for observation? If so she wanted to leave AMA, after talking with her I explained  the reason for observation and that we were monitoring her cardiac rhythms and she would have another consult with Cardiology Monday. Pt asking why she hasn't been started on medications for her heart rhythm, I explained she was in NSR as of now and we were continuing to monitor.

## 2023-06-15 NOTE — Progress Notes (Signed)
   06/15/23 0918  TOC Brief Assessment  Insurance and Status Reviewed  Patient has primary care physician Yes  Home environment has been reviewed Single  family home  Prior level of function: Independent  Prior/Current Home Services No current home services  Readmission risk has been reviewed Yes  Transition of care needs transition of care needs identified, TOC will continue to follow   CM met with  patient at bedside. Patient requested to have her prescription home delivery. CM explained to patient that she will need to inquire with her insurance provider.

## 2023-06-15 NOTE — Progress Notes (Signed)
 PROGRESS NOTE    DAIYA TAMER  ZHY:[id number] DOB: [birth year] DOA: 06/14/2023 PCP: Arrie Senate, FNP   Brief Narrative:    ZALEIGH BERMINGHAM is a 33 y.o. 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  She is noted to have some repeat findings of NSVT as well as recurrent sinus tachycardia on her telemetry leads.  She remains quite frustrated overall.  Plan to get 2D echocardiogram to reevaluate for structural heart disease and obtain cardiology consultation in AM.  Assessment & Plan:   Principal Problem:   Dysautonomia (HCC) Active Problems:   Chest pain   Dyspepsia   Autonomic dysfunction   Anxiety and depression   Vertebral artery dissection (HCC)   Nocardia infection   PTSD (post-traumatic stress disorder)  Assessment and Plan:   Dizziness possibly related to arrhythmia -Telemetry demonstrating some recurrent and frequent NSVT and with history of prior MI, plan to reevaluate for any new structural heart disease with 2D echocardiogram 4/6 -Appreciate cardiology evaluation in a.m. and likely will need further follow-up with EP -Ultimately will need follow-up at tertiary care facility for her dysautonomia -Order some potassium and magnesium to keep potassium goal at 4 and magnesium at 2, recheck in a.m.   Dysautonomia -Plan to encourage patient to follow-up with specialist at tertiary care facility   Leg infection secondary to Nocardia versus mycobacterial infection -Continue antibiotics as prescribed per ID  CAD with prior history of MI   Spontaneous right vertebral artery dissection -10/2022, follows with neurology -Currently on Plavix   Anxiety -Xanax as needed    Chronic pain -Continue home pain medications and monitor  History of lupus -Continue prednisone    DVT prophylaxis: Lovenox Code Status: Full Family Communication: Brother at bedside 4/6 Disposition Plan:  Status is: Observation The patient will require care spanning > 2 midnights and should be moved to inpatient because: Need for close monitoring and 2D echocardiogram.  Consultants:  Cardiology  Procedures:  None  Antimicrobials:  Anti-infectives (From admission, onward)    Start     Dose/Rate Route Frequency Ordered Stop   06/14/23 1400  azithromycin (ZITHROMAX) tablet 250 mg        250 mg Oral Daily 06/14/23 1236     06/14/23 1400  sulfamethoxazole-trimethoprim (BACTRIM DS) 800-160 MG per tablet 1 tablet        1 tablet Oral 2 times daily 06/14/23 1236     06/14/23 1400  meropenem (MERREM) 1 g in sodium chloride 0.9 % 100 mL IVPB        1 g 200 mL/hr over 30 Minutes Intravenous Every 8 hours 06/14/23 1307     06/14/23 1330  cefOXitin (MEFOXIN) injection 2 g  Status:  Discontinued        2 g Intravenous Daily 06/14/23 1236 06/14/23 1307      Subjective: Patient seen and evaluated today with ongoing frustration, but appears to have slept well overnight.  She denies any significant palpitations or symptomatology this morning.  Telemetry does demonstrate some recurrent sinus tachycardia along with NSVT.  Agreeable to further evaluation with 2D echocardiogram as well as cardiology in AM.  Objective: Vitals:   06/14/23 1414 06/14/23 2140 06/15/23 0656 06/15/23 0722  BP: 105/74 128/83 120/62   Pulse: 77 83 81  Resp: 18 19    Temp: 98.6 F (37 C) 98.3 F (36.8 C) 98.4 F (36.9 C)   TempSrc: Oral Oral Oral   SpO2: 100% 100% 100% 99%  Weight:      Height:        Intake/Output Summary (Last 24 hours) at 06/15/2023 0949 Last data filed at 06/15/2023 0425 Gross per 24 hour  Intake 200 ml  Output --  Net 200 ml   Filed Weights   06/14/23 0147  Weight: 55 kg     Examination:  General exam: Appears calm and comfortable  Respiratory system: Clear to auscultation. Respiratory effort normal. Cardiovascular system: S1 & S2 heard, RRR.  Gastrointestinal system: Abdomen is soft Central nervous system: Alert and awake Extremities: No edema Skin: No significant lesions noted Psychiatry: Flat affect.    Data Reviewed: I have personally reviewed following labs and imaging studies  CBC: Recent Labs  Lab 06/14/23 0149 06/15/23 0356  WBC 5.8 4.7  HGB 11.3* 12.1  HCT 34.4* 36.8  MCV 89.8 89.5  PLT 209 207   Basic Metabolic Panel: Recent Labs  Lab 06/14/23 0149 06/15/23 0356  NA 136 137  K 3.6 3.5  CL 106 103  CO2 21* 24  GLUCOSE 114* 88  BUN 10 11  CREATININE 0.80 0.68  CALCIUM 9.0 9.3  MG 1.8 1.7   GFR: Estimated Creatinine Clearance: 79.3 mL/min (by C-G formula based on SCr of 0.68 mg/dL). Liver Function Tests: No results for input(s): "AST", "ALT", "ALKPHOS", "BILITOT", "PROT", "ALBUMIN" in the last 168 hours. No results for input(s): "LIPASE", "AMYLASE" in the last 168 hours. No results for input(s): "AMMONIA" in the last 168 hours. Coagulation Profile: No results for input(s): "INR", "PROTIME" in the last 168 hours. Cardiac Enzymes: No results for input(s): "CKTOTAL", "CKMB", "CKMBINDEX", "TROPONINI" in the last 168 hours. BNP (last 3 results) No results for input(s): "PROBNP" in the last 8760 hours. HbA1C: No results for input(s): "HGBA1C" in the last 72 hours. CBG: Recent Labs  Lab 06/14/23 0209  GLUCAP 104*   Lipid Profile: No results for input(s): "CHOL", "HDL", "LDLCALC", "TRIG", "CHOLHDL", "LDLDIRECT" in the last 72 hours. Thyroid Function Tests: Recent Labs    06/14/23 1237  TSH 3.590   Anemia Panel: No results for input(s): "VITAMINB12", "FOLATE", "FERRITIN", "TIBC", "IRON", "RETICCTPCT" in the last 72 hours. Sepsis Labs: No results for input(s): "PROCALCITON", "LATICACIDVEN" in the last 168  hours.  No results found for this or any previous visit (from the past 240 hours).       Radiology Studies: No results found.      Scheduled Meds:  azithromycin  250 mg Oral Daily   Chlorhexidine Gluconate Cloth  6 each Topical Daily   clopidogrel  75 mg Oral Daily   enoxaparin (LOVENOX) injection  40 mg Subcutaneous Q24H   fluticasone furoate-vilanterol  1 puff Inhalation Daily   gabapentin  400 mg Oral BID   metoCLOPramide  5 mg Oral TID AC   potassium chloride  40 mEq Oral Once   predniSONE  5 mg Oral Daily   sodium chloride flush  10-40 mL Intracatheter Q12H   sulfamethoxazole-trimethoprim  1 tablet Oral BID   Continuous Infusions:  magnesium sulfate bolus IVPB     meropenem (MERREM) IV 1 g (06/15/23 0649)     LOS: 0 days    Time spent: 55 minutes    Anglia Blakley Hoover Brunette, DO [facility]  If 7PM-7AM, please contact night-coverage [email or web address] 06/15/2023, 9:49 AM

## 2023-06-15 NOTE — Care Management Obs Status (Signed)
 MEDICARE OBSERVATION STATUS NOTIFICATION   Patient Details  Name: Traci Hensley MRN: [id number] Date of Birth: January 17, 1991   Medicare Observation Status Notification Given:   Yes.    Anabel Halon, RN 06/15/2023, 7:28 AM

## 2023-06-15 NOTE — Plan of Care (Signed)
?  Problem: Health Behavior/Discharge Planning: ?Goal: Ability to manage health-related needs will improve ?Outcome: Progressing ?  ?Problem: Clinical Measurements: ?Goal: Will remain free from infection ?Outcome: Progressing ?  ?Problem: Clinical Measurements: ?Goal: Diagnostic test results will improve ?Outcome: Progressing ?  ?Problem: Coping: ?Goal: Level of anxiety will decrease ?Outcome: Progressing ?  ?

## 2023-06-16 ENCOUNTER — Telehealth (HOSPITAL_COMMUNITY): Payer: Self-pay | Admitting: Licensed Clinical Social Worker

## 2023-06-16 ENCOUNTER — Inpatient Hospital Stay (HOSPITAL_COMMUNITY)

## 2023-06-16 ENCOUNTER — Other Ambulatory Visit (HOSPITAL_COMMUNITY): Payer: Self-pay | Admitting: *Deleted

## 2023-06-16 DIAGNOSIS — R9431 Abnormal electrocardiogram [ECG] [EKG]: Secondary | ICD-10-CM | POA: Diagnosis not present

## 2023-06-16 DIAGNOSIS — R079 Chest pain, unspecified: Secondary | ICD-10-CM

## 2023-06-16 DIAGNOSIS — R42 Dizziness and giddiness: Secondary | ICD-10-CM

## 2023-06-16 DIAGNOSIS — G901 Familial dysautonomia [Riley-Day]: Secondary | ICD-10-CM | POA: Diagnosis not present

## 2023-06-16 LAB — ECHOCARDIOGRAM COMPLETE
AR max vel: 1.82 cm2
AV Area VTI: 1.73 cm2
AV Area mean vel: 1.85 cm2
AV Mean grad: 4.5 mmHg
AV Peak grad: 9.1 mmHg
Ao pk vel: 1.51 m/s
Area-P 1/2: 3.27 cm2
Height: 60 in
S' Lateral: 2.5 cm
Weight: 1940.05 [oz_av]

## 2023-06-16 LAB — BASIC METABOLIC PANEL WITH GFR
Anion gap: 9 (ref 5–15)
BUN: 9 mg/dL (ref 6–20)
CO2: 24 mmol/L (ref 22–32)
Calcium: 9 mg/dL (ref 8.9–10.3)
Chloride: 105 mmol/L (ref 98–111)
Creatinine, Ser: 0.67 mg/dL (ref 0.44–1.00)
GFR, Estimated: >60 mL/min (ref >60)
Glucose, Bld: 106 mg/dL — ABNORMAL HIGH (ref 70–99)
Potassium: 3.6 mmol/L (ref 3.5–5.1)
Sodium: 138 mmol/L (ref 135–145)

## 2023-06-16 LAB — MAGNESIUM: Magnesium: 1.9 mg/dL (ref 1.7–2.4)

## 2023-06-16 MED ORDER — CEFOXITIN SODIUM 2 G IV SOLR: 12.0000 g | INTRAVENOUS | 0 refills | Status: DC

## 2023-06-16 MED ORDER — LINEZOLID 600 MG PO TABS
600.0000 mg | ORAL_TABLET | Freq: Every day | ORAL | Status: DC
  Administered 2023-06-16: 600 mg via ORAL
  Filled 2023-06-16 (×2): qty 1

## 2023-06-16 MED ORDER — SODIUM CHLORIDE 0.9 % IV SOLN: INTRAVENOUS | Status: DC

## 2023-06-16 MED ORDER — MAGNESIUM SULFATE 2 GM/50ML IV SOLN
2.0000 g | Freq: Once | INTRAVENOUS | Status: AC
  Administered 2023-06-16: 2 g via INTRAVENOUS
  Filled 2023-06-16: qty 50

## 2023-06-16 MED ORDER — POTASSIUM CHLORIDE CRYS ER 20 MEQ PO TBCR
40.0000 meq | EXTENDED_RELEASE_TABLET | Freq: Two times a day (BID) | ORAL | Status: DC
  Administered 2023-06-16: 40 meq via ORAL
  Filled 2023-06-16 (×2): qty 2

## 2023-06-16 NOTE — Progress Notes (Signed)
 Patient requested to see doc at bedside to discuss discharge at the beginning of night shift, pt stated she was told by other doctors today she would be discharged. After reviewing notes I found no mention of discharge, encouraged pt to stay and reviewed her medications. Pt stated she would be leaving AMA if she wasn't discharged tonight, patient stated she has multiple responsibilities at home and needed to leave and stated she felt if no new meds were started she shouldn't have to stay another night just for observation. I contacted MD to let him know she wanted discharge orders or to be placed or she was leaving AMA, MD stated discharges are not done at night, made patient aware and pt requested to leave AMA, I had patient sign AMA papers and made Charge RN aware and encouraged her to follow up with DSS and her primaries and specialist regarding her recent hospitalization and health concerns. AMA signed and placed in chart and MD made aware.

## 2023-06-16 NOTE — Plan of Care (Signed)

## 2023-06-16 NOTE — Progress Notes (Signed)
 PROGRESS NOTE    Traci Hensley  ZOX:[id number] DOB: [birth year] DOA: 06/14/2023 PCP: Arrie Senate, FNP   Brief Narrative:    Traci Hensley is a 33 y.o. 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  She is noted to have some repeat findings of NSVT as well as recurrent sinus tachycardia on her telemetry leads.  She remains quite frustrated overall.  Plan to get 2D echocardiogram to reevaluate for structural heart disease.  Appreciate cardiology recommendations for management of orthostasis with IV fluid and plans for abdominal binder as well as compression stockings.  Assessment & Plan:   Principal Problem:   Dysautonomia (HCC) Active Problems:   Chest pain   Dyspepsia   Autonomic dysfunction   Anxiety and depression   Vertebral artery dissection (HCC)   Nocardia infection   PTSD (post-traumatic stress disorder)  Assessment and Plan:   Dizziness possibly related to arrhythmia -Telemetry demonstrating some recurrent and frequent NSVT and with history of prior MI, plan to reevaluate for any new structural heart disease with 2D echocardiogram 4/6 -Appreciate cardiology evaluation in a.m. with recommendation for IV fluid in a.m. check of cortisol -Ultimately will need follow-up at tertiary care facility for her dysautonomia -Order some potassium and magnesium to keep potassium goal at 4 and magnesium at 2, recheck in a.m. -Patient will need midodrine as well as abdominal binder and leg stockings on discharge -Plan to try and wean baclofen   Dysautonomia -Plan to encourage patient to follow-up with specialist at tertiary care facility -Plan for abdominal binder as well as lower  extremity stockings and IV normal saline infusion -Check a.m. cortisol   Leg infection secondary to Nocardia versus mycobacterial infection -Continue antibiotics as prescribed per ID -Patient will require repeat OPAT order on discharge  CAD with prior history of MI   Spontaneous right vertebral artery dissection -10/2022, follows with neurology -Currently on Plavix   Anxiety -Xanax as needed   Chronic pain -Continue home pain medications and monitor  History of lupus -Continue prednisone    DVT prophylaxis: Lovenox Code Status: Full Family Communication: Brother at bedside 4/7 Disposition Plan:  Status is: Inpatient Remains inpatient appropriate because: Need for IV fluid.   Consultants:  Cardiology  Procedures:  None  Antimicrobials:  Anti-infectives (From admission, onward)    Start     Dose/Rate Route Frequency Ordered Stop   06/16/23 1200  linezolid (ZYVOX) tablet 600 mg        600 mg Oral Daily 06/16/23 1032     06/16/23 0000  cefOXitin (MEFOXIN) 2 g injection        12 g Intravenous Every 24 hours 06/16/23 1130 07/24/23 2359   06/14/23 1400  azithromycin (ZITHROMAX) tablet 250 mg        250 mg Oral Daily 06/14/23 1236     06/14/23 1400  sulfamethoxazole-trimethoprim (BACTRIM DS) 800-160 MG per tablet 1 tablet        1 tablet Oral 2 times daily 06/14/23 1236     06/14/23 1400  meropenem (MERREM) 1 g in sodium chloride 0.9 % 100 mL IVPB        1 g 200 mL/hr over 30 Minutes Intravenous Every 8 hours 06/14/23 1307     06/14/23 1330  cefOXitin (MEFOXIN) injection 2  g  Status:  Discontinued        2 g Intravenous Daily 06/14/23 1236 06/14/23 1307      Subjective: Patient seen and evaluated today with ongoing frustration, but appears to have slept well overnight.  She continues to have some palpitations with noted runs of NSVT overnight.  Objective: Vitals:   06/15/23 1401 06/15/23 2024 06/16/23 0326 06/16/23 0736  BP: 116/68 114/78 103/63   Pulse: 89 92  72   Resp: 16 16 20    Temp: 98.1 F (36.7 C) 98.5 F (36.9 C) 98.4 F (36.9 C)   TempSrc: Oral Oral Oral   SpO2: 98% 99% 99% 99%  Weight:      Height:       No intake or output data in the 24 hours ending 06/16/23 1141  Filed Weights   06/14/23 0147  Weight: 55 kg    Examination:  General exam: Appears calm and comfortable  Respiratory system: Clear to auscultation. Respiratory effort normal. Cardiovascular system: S1 & S2 heard, RRR.  Gastrointestinal system: Abdomen is soft Central nervous system: Alert and awake Extremities: No edema Skin: No significant lesions noted Psychiatry: Flat affect.    Data Reviewed: I have personally reviewed following labs and imaging studies  CBC: Recent Labs  Lab 06/14/23 0149 06/15/23 0356  WBC 5.8 4.7  HGB 11.3* 12.1  HCT 34.4* 36.8  MCV 89.8 89.5  PLT 209 207   Basic Metabolic Panel: Recent Labs  Lab 06/14/23 0149 06/15/23 0356 06/16/23 0412  NA 136 137 138  K 3.6 3.5 3.6  CL 106 103 105  CO2 21* 24 24  GLUCOSE 114* 88 106*  BUN 10 11 9   CREATININE 0.80 0.68 0.67  CALCIUM 9.0 9.3 9.0  MG 1.8 1.7 1.9   GFR: Estimated Creatinine Clearance: 79.3 mL/min (by C-G formula based on SCr of 0.67 mg/dL). Liver Function Tests: No results for input(s): "AST", "ALT", "ALKPHOS", "BILITOT", "PROT", "ALBUMIN" in the last 168 hours. No results for input(s): "LIPASE", "AMYLASE" in the last 168 hours. No results for input(s): "AMMONIA" in the last 168 hours. Coagulation Profile: No results for input(s): "INR", "PROTIME" in the last 168 hours. Cardiac Enzymes: No results for input(s): "CKTOTAL", "CKMB", "CKMBINDEX", "TROPONINI" in the last 168 hours. BNP (last 3 results) No results for input(s): "PROBNP" in the last 8760 hours. HbA1C: No results for input(s): "HGBA1C" in the last 72 hours. CBG: Recent Labs  Lab 06/14/23 0209  GLUCAP 104*   Lipid Profile: No results for input(s): "CHOL", "HDL", "LDLCALC", "TRIG",  "CHOLHDL", "LDLDIRECT" in the last 72 hours. Thyroid Function Tests: Recent Labs    06/14/23 1237  TSH 3.590   Anemia Panel: No results for input(s): "VITAMINB12", "FOLATE", "FERRITIN", "TIBC", "IRON", "RETICCTPCT" in the last 72 hours. Sepsis Labs: No results for input(s): "PROCALCITON", "LATICACIDVEN" in the last 168 hours.  No results found for this or any previous visit (from the past 240 hours).       Radiology Studies: No results found.      Scheduled Meds:  azithromycin  250 mg Oral Daily   Chlorhexidine Gluconate Cloth  6 each Topical Daily   clopidogrel  75 mg Oral Daily   enoxaparin (LOVENOX) injection  40 mg Subcutaneous Q24H   fluticasone furoate-vilanterol  1 puff Inhalation Daily   gabapentin  400 mg Oral BID   linezolid  600 mg Oral Daily   metoCLOPramide  5 mg Oral TID AC   potassium chloride  40 mEq Oral BID  predniSONE  5 mg Oral Daily   sodium chloride flush  10-40 mL Intracatheter Q12H   sulfamethoxazole-trimethoprim  1 tablet Oral BID   Continuous Infusions:  sodium chloride     meropenem (MERREM) IV 1 g (06/16/23 0730)     LOS: 1 day    Time spent: 55 minutes    Devell Parkerson Hoover Brunette, DO [facility]  If 7PM-7AM, please contact night-coverage [email or web address] 06/16/2023, 11:41 AM

## 2023-06-16 NOTE — Telephone Encounter (Signed)
 CSW contacted patient to provide information on transportation options for medical appointments. Message left. Lasandra Beech, LCSW, CCSW-MCS [phone]
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
## 2023-06-16 NOTE — Telephone Encounter (Signed)
    Dyann Kief, PA-C  Shane Crutch; Nori Riis, RN; Duke Salvia, MD; Marjo Bicker, MD; Antoine Poche, MD Thank you Annice Pih! Cathy, please make an appt with Dr. Lanae Boast [facility] for this patient and Lasandra Beech can arrange transport for her. Elon Jester

## 2023-06-16 NOTE — Progress Notes (Signed)
*  PRELIMINARY RESULTS* Echocardiogram 2D Echocardiogram has been performed.  Stacey Drain 06/16/2023, 12:33 PM

## 2023-06-16 NOTE — Telephone Encounter (Signed)
 Patient is hospitalized, I will forward Dr.Klein's message to [facility]

## 2023-06-16 NOTE — Progress Notes (Signed)
   06/16/23 1421  TOC Brief Assessment  Insurance and Status Reviewed  Patient has primary care physician Yes  Home environment has been reviewed from home  Prior level of function: independent  Prior/Current Home Services Current home services (IV anbx)  Social Drivers of Health Review SDOH reviewed interventions complete  Readmission risk has been reviewed Yes  Transition of care needs transition of care needs identified, TOC will continue to follow    Reviewed pt's record today and noted TOC consult for medication assistance. Spoke with pt who is interested in changing to a pharmacy that will deliver the medications to her home. Offered choice and referral made to [facility]. Pharmacist at [facility] she will call pt's current pharmacy, [facility], to obtain transfer of prescriptions. TOC updated Epic with new pharmacy information and updated hospital MD as well.  Also provide written information on community resources to address SDOH concerns.   Will follow and assist if any other TOC needs arise.

## 2023-06-16 NOTE — Plan of Care (Signed)
  Problem: Education: Goal: Knowledge of General Education information will improve Description: Including pain rating scale, medication(s)/side effects and non-pharmacologic comfort measures Outcome: Progressing   Problem: Clinical Measurements: Goal: Ability to maintain clinical measurements within normal limits will improve Outcome: Progressing Goal: Will remain free from infection Outcome: Progressing Goal: Diagnostic test results will improve Outcome: Progressing Goal: Respiratory complications will improve Outcome: Progressing Goal: Cardiovascular complication will be avoided Outcome: Progressing   Problem: Nutrition: Goal: Adequate nutrition will be maintained Outcome: Progressing   Problem: Elimination: Goal: Will not experience complications related to bowel motility Outcome: Progressing Goal: Will not experience complications related to urinary retention Outcome: Progressing   Problem: Pain Managment: Goal: General experience of comfort will improve and/or be controlled Outcome: Progressing   Problem: Safety: Goal: Ability to remain free from injury will improve Outcome: Progressing   Problem: Skin Integrity: Goal: Risk for impaired skin integrity will decrease Outcome: Progressing

## 2023-06-16 NOTE — Telephone Encounter (Signed)
 [name] tells me that Traci Hensley is admitted to [facility] since last night for CP and syncope. She says she has had non-sustained runs of v-tach with a K+ of 3.6 and magnesium of 1.9

## 2023-06-17 NOTE — Discharge Summary (Signed)
 Physician Discharge Summary  Traci Hensley ZOX:[id number] DOB: Jan 23, 1991 DOA: 06/14/2023  PCP: Arrie Senate, FNP  Admit date: 06/14/2023  Discharge date: 06/16/2023 AMA DISCHARGE  Admitted From: Home  Disposition:  AMA DISCHARGE  Discharge Condition:Stable  CODE STATUS: Full  Brief/Interim Summary: Traci Hensley is a 33 y.o. 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  She is noted to have some repeat findings of NSVT as well as recurrent sinus tachycardia on her telemetry leads.  She remains quite frustrated overall.  Plan to get 2D echocardiogram to reevaluate for structural heart disease.  Appreciate cardiology recommendations for management of orthostasis with IV fluid and plans for abdominal binder as well as compression stockings.  Review of 2D echocardiogram did not demonstrate any structural abnormalities and plan was to continue to monitor telemetry and check a.m. cortisol levels.  Unfortunately, patient has become quite frustrated with her stay and left AGAINST MEDICAL ADVICE on 4/7.  Discharge Diagnoses:  Principal Problem:   Dysautonomia (HCC) Active Problems:   Chest pain   Dyspepsia   Autonomic dysfunction   Anxiety and depression   Vertebral artery dissection (HCC)   Nocardia infection   PTSD (post-traumatic stress disorder)  Principal discharge diagnosis: Dizziness/palpitations related to dysautonomia.  Discharge Instructions  Discharge Instructions     [facility] pharmacist to adjust dose for Vancomycin, Aminoglycosides and other anti-infective therapies as requested by physician.   Complete by: As directed    [facility] to provide  Cath Flo 2mg    Complete by: As directed    Administer for PICC line occlusion and as ordered by physician for other access device issues.   Anaphylaxis Kit: Provided to treat any anaphylactic reaction to the medication being provided to the patient if First Dose or when requested by physician   Complete by: As directed    Epinephrine 1mg /ml vial / amp: Administer 0.3mg  (0.47ml) subcutaneously once for moderate to severe anaphylaxis, nurse to call physician and pharmacy when reaction occurs and call 911 if needed for immediate care   Diphenhydramine 50mg /ml IV vial: Administer 25-50mg  IV/IM PRN for first dose reaction, rash, itching, mild reaction, nurse to call physician and pharmacy when reaction occurs   Sodium Chloride 0.9% NS IV: Administer if needed for hypovolemic blood pressure drop or as ordered by physician after call to physician with anaphylactic reaction   Change dressing on IV access line weekly and PRN   Complete by: As directed    Flush IV access with Sodium Chloride 0.9% and Heparin 10 units/ml or 100 units/ml   Complete by: As directed    Home infusion instructions - [facility]   Complete by: As directed    Instructions: Flush IV access with Sodium Chloride 0.9% and Heparin 10units/ml or 100units/ml   Change dressing on IV access line: Weekly and PRN   Instructions Cath Flo 2mg : Administer for PICC Line occlusion and as ordered by physician for other access device   [facility] pharmacist to adjust dose for: Vancomycin, Aminoglycosides and other anti-infective therapies as requested by physician   Method of administration may be changed at the discretion of home infusion pharmacist based upon assessment of the patient and/or caregiver's ability to self-administer the medication ordered   Complete by:  As directed    Outpatient Parenteral Antibiotic Therapy Information Antibiotic: Other; Comments: cefoxitin; Indications for use: NDM/nocardia leg  infection; End Date: 07/24/2023   Complete by: As directed    Antibiotic: Other   Comments: cefoxitin   Indications for use: NDM/nocardia leg infection   End Date: 07/24/2023      Allergies as of 06/16/2023       Reactions   Adhesive [tape] Other (See Comments)   Eats skin almost instantly   Blue Dyes (parenteral) Other (See Comments)   BLUE DYES IS OK BY ITSELF, BUT CANNOT BE COMBINED WITH GREEN DYES   Contrast Media [iodinated Contrast Media] Nausea And Vomiting   Liquid version not the IV. Causes Vomiting up blood, shaking and tremors uncontrollable, dizziness and lightheadness   Corn-containing Products Anaphylaxis, Nausea And Vomiting   ALSO GRASS [employer] PLANTS, VEGETABLES, ETC.    Doxycycline Hives, Itching, Rash, Shortness Of Breath   Green Dyes Other (See Comments)   GREEN DYE IS OK BY ITSELF, BUT CANNOT BE COMBINED WITH BLUE DYE   Nickel Itching, Swelling, Rash   Omnipaque [iohexol]    CT tech notes state "Patient allergy to IV dye"   Onion    Cannot be around onion at all or she has breathing difficulty   Other Nausea Only, Rash, Other (See Comments)   Sunlight causes extreme fatigue , Migraine , Cat Dander Any type of anesthetic causes her to have prolonged sedation, requiring more than normal doses to reverse them   Petroleum Distillate Itching, Swelling, Rash   Silicone Hives, Swelling, Rash   Bee Venom Hives   Severe disorientation   Benadryl [diphenhydramine]    Causes arrhythmia (pvcs); will make them worse    Cortisporin-tc [neomycin-colist-hc-thonzonium] Other (See Comments)   Pain, went deaf   Yellow Jacket Venom Hives   Severe disorientation        Medication List     TAKE these medications    cefOXitin 2 g injection Commonly known as: MEFOXIN Inject 12 g into the vein daily. As a continuous infusion. Indication:  NTM/Nocardia leg infection  First Dose: Yes Last Day of Therapy:  07/24/23 Labs - Once weekly:  CBC/D and BMP, Labs - Once weekly: ESR  and CRP Method of administration: Elastomeric (Continuous infusion) Method of administration may be changed at the discretion of home infusion pharmacist based upon assessment of the patient and/or caregiver's ability to self-administer the medication ordered. What changed:  how much to take when to take this additional instructions       ASK your doctor about these medications    acetaminophen 500 MG tablet Commonly known as: TYLENOL Take 2,000-2,500 mg by mouth every 6 (six) hours as needed for mild pain (pain score 1-3).   azithromycin 250 MG tablet Commonly known as: Zithromax Take 1 tablet (250 mg total) by mouth daily.   baclofen 20 MG tablet Commonly known as: LIORESAL Take 1 tablet (20 mg total) by mouth as needed for muscle spasms.   Breo Ellipta 100-25 MCG/ACT Aepb Generic drug: fluticasone furoate-vilanterol Inhale 1 puff into the lungs daily.   clopidogrel 75 MG tablet Commonly known as: PLAVIX Take 1 tablet (75 mg total) by mouth daily.   cyanocobalamin 1000 MCG/ML injection Commonly known as: VITAMIN B12 INJECT 1 ML INTRAMUSCULARLY EVERY OTHER WEEK   EPINEPHrine 0.3 mg/0.3 mL Soaj injection Commonly known as: EPI-PEN Inject 0.3 mg into the muscle as needed for anaphylaxis.   ergocalciferol 1.25 MG (50000 UT) capsule Commonly known  as: VITAMIN D2 Take 50,000 Units by mouth every Friday.   gabapentin 400 MG capsule Commonly known as: NEURONTIN Take 1 capsule (400 mg total) by mouth 2 (two) times daily.   hydrALAZINE 10 MG tablet Commonly known as: APRESOLINE Take 0.5 tablets (5 mg total) by mouth as needed (BP over 140 mmhg).   ibuprofen 200 MG tablet Commonly known as: ADVIL Take 1,600 mg by mouth every 6 (six) hours as needed for mild pain (pain score 1-3).   metoCLOPramide 5 MG tablet Commonly known as: Reglan Take 1 tablet (5 mg total) by mouth 3 (three) times daily before meals.   nitroGLYCERIN 0.4 MG SL tablet Commonly known as:  NITROSTAT Place 1 tablet (0.4 mg total) under the tongue every 5 (five) minutes as needed for chest pain.   NONFORMULARY OR COMPOUNDED ITEM Apply 1 application  topically daily as needed (arthritis in spine). Ketamine/Gabapentin/Baclofen/Lidocaine/Menthol Cream   Nortrel 0.5/35 (28) 0.5-35 MG-MCG tablet Generic drug: norethindrone-ethinyl estradiol Take 1 tablet by mouth daily.   olmesartan 20 MG tablet Commonly known as: BENICAR Take 1 tablet (20 mg total) by mouth daily.   ondansetron 8 MG disintegrating tablet Commonly known as: ZOFRAN-ODT Take 1 tablet (8 mg total) by mouth as needed for nausea or vomiting.   oxyCODONE-acetaminophen 7.5-325 MG tablet Commonly known as: PERCOCET Take 1 tablet by mouth every 4 (four) hours as needed for severe pain (pain score 7-10).   predniSONE 5 MG tablet Commonly known as: DELTASONE Take 1 tablet (5 mg total) by mouth daily.   promethazine 25 MG tablet Commonly known as: PHENERGAN Take 1 tablet (25 mg total) by mouth every 6 (six) hours as needed for nausea or vomiting. Would take one hour prior to taking antibiotics. Try to see if this works better than zofran for nausea   sulfamethoxazole-trimethoprim 800-160 MG tablet Commonly known as: BACTRIM DS Take 1 tablet by mouth 2 (two) times daily.   triamcinolone cream 0.1 % Commonly known as: KENALOG Apply 1 Application topically daily.               Discharge Care Instructions  (From admission, onward)           Start     Ordered   06/16/23 0000  Change dressing on IV access line weekly and PRN  (Home infusion instructions - [facility] )        06/16/23 1130            Follow-up Information     [facility] [place] Follow up.   Why: Your prescriptions have been transferred to [facility]. They will deliver your medications to your home. Please reach out to them with any questions. Contact information: [address] [place]  [address] [phone]                Allergies  Allergen Reactions   Adhesive [Tape] Other (See Comments)    Eats skin almost instantly   Blue Dyes (Parenteral) Other (See Comments)    BLUE DYES IS OK BY ITSELF, BUT CANNOT BE COMBINED WITH GREEN DYES   Contrast Media [Iodinated Contrast Media] Nausea And Vomiting    Liquid version not the IV. Causes Vomiting up blood, shaking and tremors uncontrollable, dizziness and lightheadness   Corn-Containing Products Anaphylaxis and Nausea And Vomiting    ALSO GRASS [employer] PLANTS, VEGETABLES, ETC.    Doxycycline Hives, Itching, Rash and Shortness Of Breath   Green Dyes Other (See Comments)    GREEN  DYE IS OK BY ITSELF, BUT CANNOT BE COMBINED WITH BLUE DYE   Nickel Itching, Swelling and Rash   Omnipaque [Iohexol]     CT tech notes state "Patient allergy to IV dye"   Onion     Cannot be around onion at all or she has breathing difficulty   Other Nausea Only, Rash and Other (See Comments)    Sunlight causes extreme fatigue , Migraine , Cat Dander  Any type of anesthetic causes her to have prolonged sedation, requiring more than normal doses to reverse them   Petroleum Distillate Itching, Swelling and Rash   Silicone Hives, Swelling and Rash   Bee Venom Hives    Severe disorientation   Benadryl [Diphenhydramine]     Causes arrhythmia (pvcs); will make them worse    Cortisporin-Tc [Neomycin-Colist-Hc-Thonzonium] Other (See Comments)    Pain, went deaf   Yellow Jacket Venom Hives    Severe disorientation    Consultations: Cardiology   Procedures/Studies: ECHOCARDIOGRAM COMPLETE Result Date: 06/16/2023    ECHOCARDIOGRAM REPORT   Patient Name:   Traci Hensley Date of Exam: 06/16/2023 Medical Rec #:  [id number]       Height:       60.0 in Accession #:    [id number]      Weight:       121.3 lb Date of Birth:  May 18, 1990      BSA:          1.509 m Patient Age:    32 years        BP:           103/63 mmHg Patient Gender: F               HR:            72 bpm. [facility] Procedure: 2D Echo, Cardiac Doppler and Color Doppler (Both Spectral and Color            Flow Doppler were utilized during procedure). Indications:    Abnormal ECG R94.31  History:        Patient has prior history of Echocardiogram examinations, most                 recent 12/11/2016. Signs/Symptoms:Palpitations; Risk                 Factors:Former Smoker.  Sonographer:    Celesta Gentile RCS Referring Phys: [id number] Rozena Fierro D [facility] IMPRESSIONS  1. Left ventricular ejection fraction, by estimation, is 60 to 65%. The left ventricle has normal function. The left ventricle has no regional wall motion abnormalities. Left ventricular diastolic parameters were normal.  2. Right ventricular systolic function is normal. The right ventricular size is normal.  3. The mitral valve is normal in structure. No evidence of mitral valve regurgitation. No evidence of mitral stenosis.  4. The aortic valve has an indeterminant number of cusps. Aortic valve regurgitation is not visualized. No aortic stenosis is present.  5. The inferior vena cava is normal in size with greater than 50% respiratory variability, suggesting right atrial pressure of 3 mmHg. FINDINGS  Left Ventricle: Left ventricular ejection fraction, by estimation, is 60 to 65%. The left ventricle has normal function. The left ventricle has no regional wall motion abnormalities. The left ventricular internal cavity size was normal in size. There is  no left ventricular hypertrophy. Left ventricular diastolic parameters were normal. Right Ventricle: The right ventricular size is normal. Right vetricular wall thickness was not  well visualized. Right ventricular systolic function is normal. Left Atrium: Left atrial size was normal in size. Right Atrium: Right atrial size was normal in size. Pericardium: There is no evidence of pericardial effusion. Mitral Valve: The mitral valve is normal in structure. No evidence of mitral valve  regurgitation. No evidence of mitral valve stenosis. Tricuspid Valve: The tricuspid valve is normal in structure. Tricuspid valve regurgitation is not demonstrated. No evidence of tricuspid stenosis. Aortic Valve: The aortic valve has an indeterminant number of cusps. Aortic valve regurgitation is not visualized. No aortic stenosis is present. Aortic valve mean gradient measures 4.5 mmHg. Aortic valve peak gradient measures 9.1 mmHg. Aortic valve area, by VTI measures 1.73 cm. Pulmonic Valve: The pulmonic valve was not well visualized. Pulmonic valve regurgitation is not visualized. No evidence of pulmonic stenosis. Aorta: The aortic root is normal in size and structure. Venous: The inferior vena cava is normal in size with greater than 50% respiratory variability, suggesting right atrial pressure of 3 mmHg. IAS/Shunts: No atrial level shunt detected by color flow Doppler.  LEFT VENTRICLE PLAX 2D LVIDd:         3.90 cm   Diastology LVIDs:         2.50 cm   LV e' medial:    13.70 cm/s LV PW:         0.70 cm   LV E/e' medial:  6.6 LV IVS:        0.60 cm   LV e' lateral:   15.00 cm/s LVOT diam:     1.70 cm   LV E/e' lateral: 6.1 LV SV:         54 LV SV Index:   36 LVOT Area:     2.27 cm  RIGHT VENTRICLE RV S prime:     11.10 cm/s TAPSE (M-mode): 1.4 cm LEFT ATRIUM             Index        RIGHT ATRIUM          Index LA diam:        2.60 cm 1.72 cm/m   RA Area:     7.42 cm LA Vol (A2C):   29.5 ml 19.55 ml/m  RA Volume:   11.50 ml 7.62 ml/m LA Vol (A4C):   26.0 ml 17.23 ml/m LA Biplane Vol: 29.1 ml 19.28 ml/m  AORTIC VALVE AV Area (Vmax):    1.82 cm AV Area (Vmean):   1.85 cm AV Area (VTI):     1.73 cm AV Vmax:           151.00 cm/s AV Vmean:          98.350 cm/s AV VTI:            0.310 m AV Peak Grad:      9.1 mmHg AV Mean Grad:      4.5 mmHg LVOT Vmax:         121.00 cm/s LVOT Vmean:        80.000 cm/s LVOT VTI:          0.237 m LVOT/AV VTI ratio: 0.76  AORTA Ao Root diam: 2.70 cm MITRAL VALVE MV Area  (PHT): 3.27 cm    SHUNTS MV Decel Time: 232 msec    Systemic VTI:  0.24 m MV E velocity: 90.90 cm/s  Systemic Diam: 1.70 cm MV A velocity: 58.30 cm/s MV E/A ratio:  1.56 Dina Rich MD Electronically signed by Dina Rich MD Signature Date/Time: 06/16/2023/1:42:29  PM    Final    IR Fluoro Rm 30-60 Min Result Date: 05/22/2023 INDICATION: Malfunctioning PICC line. Request for evaluation and possible exchange. EXAM: FLUORO CHEST COMPARISON:  None Available. FINDINGS: Patient [facility] to [facility] Interventional radiology due to malfunctioning left arm PICC. She states that the purple lumen could not be flushed yesterday. Both lumen evaluated, aspirates and flushes well. Fluoroscopy of the chest showed the PICC line tip in the SVC/RA. After thorough discussion and shared decision-making, decision was made to not exchange the PICC line as areas appropriately positioned and functioning well. PICC insertion site was evaluated per patient's request, the insertion site with mild arrhythmia without signs and symptoms of infection. New dressing was placed. IMPRESSION: The left arm PICC line is appropriately positioned and functioning well. This procedure was performed by Lawernce Ion, PA-C under supervision of Gilmer Mor, D.O. Electronically Signed   By: Gilmer Mor D.O.   On: 05/22/2023 13:33   DG Chest Portable 1 View Result Date: 05/19/2023 CLINICAL DATA:  PICC placement EXAM: PORTABLE CHEST 1 VIEW COMPARISON:  Chest x-ray 05/01/2023 FINDINGS: There is a left upper extremity PICC terminating in the distal SVC. There is no pneumothorax. The lungs are clear. There is no pleural effusion. Cardiomediastinal silhouette is within normal limits. No acute fractures are seen. IMPRESSION: Left upper extremity PICC terminating in the distal SVC. Electronically Signed   By: Darliss Cheney M.D.   On: 05/19/2023 19:41     Discharge Exam: Vitals:   06/16/23 0736 06/16/23 2003  BP:  (!) 114/92  Pulse:  81  Resp:  20   Temp:  98.7 F (37.1 C)  SpO2: 99% 100%   Vitals:   06/15/23 2024 06/16/23 0326 06/16/23 0736 06/16/23 2003  BP: 114/78 103/63  (!) 114/92  Pulse: 92 72  81  Resp: 16 20  20   Temp: 98.5 F (36.9 C) 98.4 F (36.9 C)  98.7 F (37.1 C)  TempSrc: Oral Oral  Oral  SpO2: 99% 99% 99% 100%  Weight:      Height:        General: Pt is alert, awake, not in acute distress Cardiovascular: RRR, S1/S2 +, no rubs, no gallops Respiratory: CTA bilaterally, no wheezing, no rhonchi Abdominal: Soft, NT, ND, bowel sounds + Extremities: no edema, no cyanosis    The results of significant diagnostics from this hospitalization (including imaging, microbiology, ancillary and laboratory) are listed below for reference.     Microbiology: No results found for this or any previous visit (from the past 240 hours).   Labs: BNP (last 3 results) No results for input(s): "BNP" in the last 8760 hours. Basic Metabolic Panel: Recent Labs  Lab 06/14/23 0149 06/15/23 0356 06/16/23 0412  NA 136 137 138  K 3.6 3.5 3.6  CL 106 103 105  CO2 21* 24 24  GLUCOSE 114* 88 106*  BUN 10 11 9   CREATININE 0.80 0.68 0.67  CALCIUM 9.0 9.3 9.0  MG 1.8 1.7 1.9   Liver Function Tests: No results for input(s): "AST", "ALT", "ALKPHOS", "BILITOT", "PROT", "ALBUMIN" in the last 168 hours. No results for input(s): "LIPASE", "AMYLASE" in the last 168 hours. No results for input(s): "AMMONIA" in the last 168 hours. CBC: Recent Labs  Lab 06/14/23 0149 06/15/23 0356  WBC 5.8 4.7  HGB 11.3* 12.1  HCT 34.4* 36.8  MCV 89.8 89.5  PLT 209 207   Cardiac Enzymes: No results for input(s): "CKTOTAL", "CKMB", "CKMBINDEX", "TROPONINI" in the last 168 hours.  BNP: Invalid input(s): "POCBNP" CBG: Recent Labs  Lab 06/14/23 0209  GLUCAP 104*   D-Dimer No results for input(s): "DDIMER" in the last 72 hours. Hgb A1c No results for input(s): "HGBA1C" in the last 72 hours. Lipid Profile No results for input(s): "CHOL",  "HDL", "LDLCALC", "TRIG", "CHOLHDL", "LDLDIRECT" in the last 72 hours. Thyroid function studies Recent Labs    06/14/23 1237  TSH 3.590   Anemia work up No results for input(s): "VITAMINB12", "FOLATE", "FERRITIN", "TIBC", "IRON", "RETICCTPCT" in the last 72 hours. Urinalysis    Component Value Date/Time   COLORURINE COLORLESS (A) 06/14/2023 0149   APPEARANCEUR CLEAR 06/14/2023 0149   APPEARANCEUR Clear 10/26/2012 0112   LABSPEC 1.006 06/14/2023 0149   LABSPEC 1.020 10/26/2012 0112   PHURINE 7.0 06/14/2023 0149   GLUCOSEU NEGATIVE 06/14/2023 0149   GLUCOSEU Negative 10/26/2012 0112   HGBUR NEGATIVE 06/14/2023 0149   BILIRUBINUR NEGATIVE 06/14/2023 0149   BILIRUBINUR Negative 10/26/2012 0112   KETONESUR NEGATIVE 06/14/2023 0149   PROTEINUR NEGATIVE 06/14/2023 0149   UROBILINOGEN 0.2 04/07/2013 2308   NITRITE NEGATIVE 06/14/2023 0149   LEUKOCYTESUR NEGATIVE 06/14/2023 0149   LEUKOCYTESUR 1+ 10/26/2012 0112   Sepsis Labs Recent Labs  Lab 06/14/23 0149 06/15/23 0356  WBC 5.8 4.7   Microbiology No results found for this or any previous visit (from the past 240 hours).   Time coordinating discharge: 35 minutes  SIGNED:   Erick Blinks, DO [facility] 06/17/2023, 7:13 AM  If 7PM-7AM, please contact night-coverage [email or web address]

## 2023-06-17 NOTE — Telephone Encounter (Signed)
 Per Discharge Summary pt left [facility] 06/16/23.

## 2023-06-18 ENCOUNTER — Ambulatory Visit (INDEPENDENT_AMBULATORY_CARE_PROVIDER_SITE_OTHER): Admitting: Family Medicine

## 2023-06-18 ENCOUNTER — Encounter: Payer: Self-pay | Admitting: Family Medicine

## 2023-06-18 VITALS — BP 113/74 | HR 76 | Temp 98.7°F | Ht 60.0 in | Wt 117.0 lb

## 2023-06-18 DIAGNOSIS — J45909 Unspecified asthma, uncomplicated: Secondary | ICD-10-CM | POA: Insufficient documentation

## 2023-06-18 DIAGNOSIS — R55 Syncope and collapse: Secondary | ICD-10-CM

## 2023-06-18 DIAGNOSIS — R002 Palpitations: Secondary | ICD-10-CM | POA: Diagnosis not present

## 2023-06-18 DIAGNOSIS — G909 Disorder of the autonomic nervous system, unspecified: Secondary | ICD-10-CM

## 2023-06-18 NOTE — Patient Instructions (Signed)
[phone]  
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
## 2023-06-19 ENCOUNTER — Encounter: Payer: Self-pay | Admitting: Family Medicine

## 2023-06-19 LAB — CBC WITH DIFFERENTIAL/PLATELET
Basophils Absolute: 0 10*3/uL (ref 0.0–0.2)
Basos: 1 %
EOS (ABSOLUTE): 0.1 10*3/uL (ref 0.0–0.4)
Eos: 2 %
Hematocrit: 37.9 % (ref 34.0–46.6)
Hemoglobin: 12.5 g/dL (ref 11.1–15.9)
Immature Grans (Abs): 0 10*3/uL (ref 0.0–0.1)
Immature Granulocytes: 0 %
Lymphocytes Absolute: 1.4 10*3/uL (ref 0.7–3.1)
Lymphs: 22 %
MCH: 29.4 pg (ref 26.6–33.0)
MCHC: 33 g/dL (ref 31.5–35.7)
MCV: 89 fL (ref 79–97)
Monocytes Absolute: 0.7 10*3/uL (ref 0.1–0.9)
Monocytes: 12 %
Neutrophils Absolute: 4 10*3/uL (ref 1.4–7.0)
Neutrophils: 63 %
Platelets: 239 10*3/uL (ref 150–450)
RBC: 4.25 x10E6/uL (ref 3.77–5.28)
RDW: 11.2 % — ABNORMAL LOW (ref 11.7–15.4)
WBC: 6.3 10*3/uL (ref 3.4–10.8)

## 2023-06-19 LAB — BMP8+EGFR
BUN/Creatinine Ratio: 16 (ref 9–23)
BUN: 11 mg/dL (ref 6–20)
CO2: 24 mmol/L (ref 20–29)
Calcium: 9.6 mg/dL (ref 8.7–10.2)
Chloride: 103 mmol/L (ref 96–106)
Creatinine, Ser: 0.69 mg/dL (ref 0.57–1.00)
Glucose: 79 mg/dL (ref 70–99)
Potassium: 4.1 mmol/L (ref 3.5–5.2)
Sodium: 141 mmol/L (ref 134–144)
eGFR: 119 mL/min/{1.73_m2} (ref >59)

## 2023-06-19 LAB — MAGNESIUM: Magnesium: 1.9 mg/dL (ref 1.6–2.3)

## 2023-06-19 NOTE — Progress Notes (Signed)
 Slightly decreased RDW, not concerning at this time. All other labs normal.

## 2023-06-20 ENCOUNTER — Telehealth (HOSPITAL_COMMUNITY): Payer: Self-pay | Admitting: Licensed Clinical Social Worker

## 2023-06-20 NOTE — Telephone Encounter (Signed)
 CSW contacted patient to follow up on transport need for upcoming appointment at [facility]. Patient states that the appointment has not been made as the provider at [facility] is not accepting new patients. Patient is unsure on next steps. CSW sent message to office staff on behalf of patient for further advice and next steps. Lasandra Beech, LCSW, CCSW-MCS [phone]

## 2023-06-23 ENCOUNTER — Telehealth: Payer: Self-pay | Admitting: Neurology

## 2023-06-23 ENCOUNTER — Telehealth: Payer: Self-pay

## 2023-06-23 ENCOUNTER — Ambulatory Visit: Admitting: Internal Medicine

## 2023-06-23 NOTE — Telephone Encounter (Signed)
 Patient advised of note below

## 2023-06-23 NOTE — Telephone Encounter (Signed)
 Thank you :)

## 2023-06-23 NOTE — Telephone Encounter (Signed)
 Patient aware and verbalizes understanding.  States that she has a MRI scheduled in the morning at 7am.  Order was placed by her neurologist but he is out on a family emergency.  Patient states that she talked to neurology office because she needs an order from a provider to be allowed to access her pick line.  States that the neurology office tried to get other providers to write the order but no one would.  Advised that she call her primary care and ask.  If she does not have a order being able to access her pick line she will not be able to have the MRI in the morning.  Will send to PCP covering provider.   If approved order will need to be faxed to [facility].

## 2023-06-23 NOTE — Telephone Encounter (Signed)
 Patient aware Dr.Vu has given orders to STOP IV antibiotics and pull PICC line.   Traci Hensley

## 2023-06-23 NOTE — Telephone Encounter (Signed)
 Per patient due to all her Dyhydration  she is having it is hard to get a vein to do blood even.   If someone could get the order to be approved an sent over be for her visit 4/17.

## 2023-06-23 NOTE — Telephone Encounter (Signed)
 Who manages her PICC line, PCP? I would check with them, I am not so sure about using PICC line for contrast. Thanks

## 2023-06-23 NOTE — Telephone Encounter (Signed)
 I'm not sure why they need to use her PICC line for MRI but infectious disease manages her PICC line. I will cc Dr Ernie Heal as a courtesy for this request.

## 2023-06-23 NOTE — Telephone Encounter (Addendum)
 Informed patient orders have been faxed to [facility] [place] for patient to receive contrast via PICC line.   Patient stated that she can not keep any Bactrim or any other medication down, when she takes pills she vomits.  Patient would like to keep IV antibiotic in because she thinks it is helping and that is how she gets fluids administered.   Patient not scheduled for follow up until 07/17/2023.

## 2023-06-23 NOTE — Telephone Encounter (Signed)
 Pt called in and left message with the access nurse. Says she has an MRI and they need an order to be able to use her pic line for the contrast.

## 2023-06-23 NOTE — Telephone Encounter (Signed)
 Copied from CRM [id number]. Topic: Clinical - Medical Advice >> Jun 23, 2023  1:52 PM Opal Bill wrote: Reason for CRM: Pt has contacted her Cardiologist about getting the heart monitoring done, but they are not seeing patients until June. Pt cannot wait this long to be seen. Will Dr. Rosalynn Come be willing to do the heart monitoring instead? Pt doesn't want to end up back in the hospital. Please follow up as soon as possible.

## 2023-06-23 NOTE — Telephone Encounter (Signed)
 Per Dr.Vu STOP all IV antibiotics and fluids - PICC line can be removed.   Message sent to Kay Parson and [employer] team and [facility] pharmacists.   Raney Antwine Roann Chestnut, CMA

## 2023-06-24 ENCOUNTER — Telehealth: Payer: Self-pay

## 2023-06-24 ENCOUNTER — Ambulatory Visit
Admission: RE | Admit: 2023-06-24 | Discharge: 2023-06-24 | Disposition: A | Discharge status: Home / Self Care | Source: Ambulatory Visit | Attending: Neurology

## 2023-06-24 ENCOUNTER — Ambulatory Visit
Admission: RE | Admit: 2023-06-24 | Discharge: 2023-06-24 | Disposition: A | Discharge status: Home / Self Care | Source: Ambulatory Visit | Attending: Neurology | Admitting: Neurology

## 2023-06-24 ENCOUNTER — Encounter: Payer: Self-pay | Admitting: Family Medicine

## 2023-06-24 ENCOUNTER — Telehealth: Payer: Self-pay | Admitting: Family Medicine

## 2023-06-24 DIAGNOSIS — I7774 Dissection of vertebral artery: Secondary | ICD-10-CM

## 2023-06-24 DIAGNOSIS — S15101A Unspecified injury of right vertebral artery, initial encounter: Secondary | ICD-10-CM

## 2023-06-24 DIAGNOSIS — S15102A Unspecified injury of left vertebral artery, initial encounter: Secondary | ICD-10-CM

## 2023-06-24 MED ORDER — GADOPICLENOL 0.5 MMOL/ML IV SOLN
7.5000 mL | Freq: Once | INTRAVENOUS | Status: AC | PRN
  Administered 2023-06-24: 7.5 mL via INTRAVENOUS

## 2023-06-24 NOTE — Telephone Encounter (Signed)
 Copied from CRM [id number]. Topic: Clinical - Medical Advice >> Jun 24, 2023  2:53 PM Alysia Jumbo S wrote: Reason for CRM: Patient states picc line will be pulled today and patient is requesting to speak with provider to have a rx rewritten to keep fluids going and so that picc line will not get pulled. She states that she has contacted infectious disease, but was told that she would have to have a new prescription written by her PCP. Please contact patient with additional instructions

## 2023-06-24 NOTE — Telephone Encounter (Signed)
**Note De-identified  [place] Obfuscation** Please advise 

## 2023-06-24 NOTE — Telephone Encounter (Signed)
 Copied from CRM [id number]. Topic: General - Other >> Jun 24, 2023  3:16 PM Felizardo Hotter wrote: Reason for CRM: Pt called ask that this be stopped. Pt does not want PICC removed. Please contact pt [phone] or send orders to continue PICC line. Pt stated they are coming today at 5:00 p.m. to remove it.   Per Dr.Vu STOP all IV antibiotics and fluids - PICC line can be removed.    Message sent to Kay Parson and [employer] team and [facility] pharmacists.    Tiffany Roann Chestnut, CMA

## 2023-06-25 ENCOUNTER — Telehealth: Payer: Self-pay | Admitting: Family Medicine

## 2023-06-25 ENCOUNTER — Encounter: Payer: Self-pay | Admitting: Family Medicine

## 2023-06-25 ENCOUNTER — Ambulatory Visit: Admitting: Family Medicine

## 2023-06-25 VITALS — BP 120/80 | HR 66 | Temp 98.0°F | Ht 60.0 in | Wt 117.0 lb

## 2023-06-25 DIAGNOSIS — Z5982 Transportation insecurity: Secondary | ICD-10-CM

## 2023-06-25 DIAGNOSIS — G909 Disorder of the autonomic nervous system, unspecified: Secondary | ICD-10-CM

## 2023-06-25 DIAGNOSIS — L989 Disorder of the skin and subcutaneous tissue, unspecified: Secondary | ICD-10-CM | POA: Diagnosis not present

## 2023-06-25 DIAGNOSIS — F4310 Post-traumatic stress disorder, unspecified: Secondary | ICD-10-CM

## 2023-06-25 DIAGNOSIS — F339 Major depressive disorder, recurrent, unspecified: Secondary | ICD-10-CM

## 2023-06-25 DIAGNOSIS — F419 Anxiety disorder, unspecified: Secondary | ICD-10-CM

## 2023-06-25 DIAGNOSIS — R944 Abnormal results of kidney function studies: Secondary | ICD-10-CM

## 2023-06-25 DIAGNOSIS — E440 Moderate protein-calorie malnutrition: Secondary | ICD-10-CM

## 2023-06-25 DIAGNOSIS — E559 Vitamin D deficiency, unspecified: Secondary | ICD-10-CM

## 2023-06-25 DIAGNOSIS — R5381 Other malaise: Secondary | ICD-10-CM

## 2023-06-25 LAB — LAB REPORT - SCANNED: EGFR: 47

## 2023-06-25 NOTE — Telephone Encounter (Unsigned)
 Copied from CRM [id number]. Topic: General - Other >> Jun 25, 2023 10:11 AM Alysia Jumbo S wrote: Reason for CRM: Sokia with [facility] states that form [phone] section 6 is not complete. Request that form be completed and re-faxed. Fax # [phone] or to the fax # on the form.

## 2023-06-25 NOTE — Telephone Encounter (Signed)
 Called and spoke with patient. Patient states that they took out the PICC line yesterday at 5. She has some concerns and would like to have it put back in. Patient has an appt with you today and will discuss
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
## 2023-06-26 NOTE — Telephone Encounter (Signed)
 [id number] PCS form faxed to [phone]

## 2023-06-26 NOTE — Progress Notes (Signed)
 Thank you Garnette Ka

## 2023-07-01 ENCOUNTER — Telehealth: Payer: Self-pay

## 2023-07-01 NOTE — Progress Notes (Signed)
 Complex Care Management Note  Care Guide Note 07/01/2023 Name: Traci Hensley MRN: [id number] DOB: 26-Jul-1990  [name] is a 33 y.o. year old female who sees Milian, Winda Hastings, FNP for primary care. I reached out to [name] by phone today to offer complex care management services.  [name] was given information about Complex Care Management services today including:   The Complex Care Management services include support from the care team which includes your Nurse Care Manager, Clinical Social Worker, or Pharmacist.  The Complex Care Management team is here to help remove barriers to the health concerns and goals most important to you. Complex Care Management services are voluntary, and the patient may decline or stop services at any time by request to their care team member.   Complex Care Management Consent Status: Patient agreed to services and verbal consent obtained.   Follow up plan:  Telephone appointment with complex care management team member scheduled for:  07/11/2023  Encounter Outcome:  Patient Scheduled  Lenton Rail , RMA     Success  [facility], [facility] Guide  Direct Dial: [phone]  Website: [email or web address]

## 2023-07-02 ENCOUNTER — Telehealth: Payer: Self-pay

## 2023-07-02 ENCOUNTER — Telehealth: Payer: Self-pay | Admitting: Pharmacist

## 2023-07-02 NOTE — Telephone Encounter (Signed)
 These were drawn on 4/14 which I think is the last day she had her PICC

## 2023-07-02 NOTE — Progress Notes (Signed)
   Telephone encounter was:  Successful.  Complex Care Management Note Care Guide Note  07/02/2023 Name: Traci Hensley MRN: [id number] DOB: 09/28/1990  Traci Hensley is a 33 y.o. year old female who is a primary care patient of Rosalynn Come, Winda Hastings, FNP . The community resource team was consulted for assistance with Transportation Needs   SDOH screenings and interventions completed:  Yes  Social Drivers of Health From This Encounter   Transportation Needs: Unmet Transportation Needs (07/02/2023)   PRAPARE - Administrator, Civil Service (Medical): Yes    Lack of Transportation (Non-Medical): Yes    SDOH Interventions Today    Flowsheet Row Most Recent Value  SDOH Interventions   Transportation Interventions Community Resources Provided        Care guide performed the following interventions: Patient provided with information about care guide support team and interviewed to confirm resource needs.Pt is requesting information for [place] transportation for her medical visits. I gave Pt resources over the phone   Follow Up Plan:  No further follow up planned at this time. The patient has been provided with needed resources.  Encounter Outcome:  Patient Visit Completed    Azell Leopard [facility]  [facility] Guide, Phone: [phone] Fax: [phone] Website: [email or web address]

## 2023-07-02 NOTE — Telephone Encounter (Signed)
 Received patient's labs from 4/14 - SCr 1.51 - increased from 1.26 ~05/29/23 and 0.69 on 06/18/23. Unsure if taking oral antibiotics right now but passing along as FYI.  Nicklas Barns, PharmD, CPP, BCIDP, AAHIVP Clinical Pharmacist Practitioner Infectious Diseases Clinical Pharmacist [facility] for Infectious Disease

## 2023-07-07 ENCOUNTER — Ambulatory Visit (INDEPENDENT_AMBULATORY_CARE_PROVIDER_SITE_OTHER): Admitting: Neurology

## 2023-07-07 DIAGNOSIS — R200 Anesthesia of skin: Secondary | ICD-10-CM

## 2023-07-07 DIAGNOSIS — R202 Paresthesia of skin: Secondary | ICD-10-CM

## 2023-07-07 NOTE — Procedures (Signed)
 [facility]  [address] [place], [address]  [place], [place] [address] Tel: [phone] Fax: [phone] Test Date:  07/07/2023  Patient: Traci Hensley DOB: 22-Jun-1990 Physician: Rommie Coats, MD  Sex: Female Height: 5\' 0"  Ref Phys: Janne Members, DO  ID#: [id number]   Technician:    History: This is a 33 year old female with right greater than left weakness and paresthesias.  NCV & EMG Findings: Extensive electrodiagnostic evaluation of the right upper and lower limbs shows: Right sural, superficial peroneal/fibular, median, ulnar, and radial sensory responses are within normal limits. Right peroneal/fibular (EDB), tibial (AH), median (APB), and ulnar (ADM) motor responses are within normal limits. Right H reflex latency is within normal limits. There is no evidence of active or chronic motor axon loss changes affecting any of the tested muscles on needle examination. Motor unit configuration and recruitment pattern is within normal limits.  Impression: This is a normal study. Specifically: No electrodiagnostic evidence of a large fiber sensorimotor neuropathy or myopathy. No electrodiagnostic evidence of a right cervical (C5-C8) or lumbosacral (L3-S1) motor radiculopathy. No electrodiagnostic evidence of a right median mononeuropathy at or distal to the wrist (ie: carpal tunnel syndrome).   ___________________________ Rommie Coats, MD    Nerve Conduction Studies Motor Nerve Results    Latency Amplitude F-Lat Segment Distance CV Comment  Site (ms) Norm (mV) Norm (ms)  (cm) (m/s) Norm   Right Fibular (EDB) Motor  Ankle 3.2  < 5.5 4.7  > 3.0        Bel fib head 9.3 - 4.6 -  Bel fib head-Ankle 28.5 47  > 40   Pop fossa 11.4 - 4.5 -  Pop fossa-Bel fib head 10 48 -   Right Median (APB) Motor  Wrist 1.98  < 3.9 8.7  > 6.0        Elbow 5.9 - 8.7 -  Elbow-Wrist 25 64  > 50   Right Tibial (AH) Motor  Ankle 3.7  < 6.0 21.4  > 8.0        Knee 11.0 - 18.6 -  Knee-Ankle 39  53  > 40   Right Ulnar (ADM) Motor  Wrist 1.70  < 3.1 10.7  > 7.0        Bel elbow 4.4 - 10.5 -  Bel elbow-Wrist 19.5 72  > 50   Ab elbow 6.0 - 10.2 -  Ab elbow-Bel elbow 10 63 -    Sensory Sites    Neg Peak Lat Amplitude (O-P) Segment Distance Velocity Comment  Site (ms) Norm (V) Norm  (cm) (ms)   Right Median Sensory  Wrist-Dig II 2.5  < 3.4 32  > 20 Wrist-Dig II 13    Right Radial Sensory  Forearm-Wrist 2.0  < 2.7 32  > 18 Forearm-Wrist 10    Right Superficial Fibular Sensory  14 cm-Ankle 2.6  < 4.5 13  > 5 14 cm-Ankle 14    Right Sural Sensory  Calf-Lat mall 3.5  < 4.5 10  > 5 Calf-Lat mall 14    Right Ulnar Sensory  Wrist-Dig V 2.2  < 3.1 30  > 12 Wrist-Dig V 11     H-Reflex Results    M-Lat H Lat H Neg Amp H-M Lat  Site (ms) (ms) Norm (mV) (ms)  Right Tibial H-Reflex  Pop fossa 6.0 30.7  < 35.0 3.4 24.7   Electromyography   Side Muscle Ins.Act Fibs Fasc Recrt Amp Dur Poly Activation Comment  Right Tib ant  Nml Nml Nml *None *- *- *- *None N/A  Right Gastroc MH Nml Nml Nml *None *- *- *- *None N/A  Right Rectus fem Nml Nml Nml Nml Nml Nml Nml Nml N/A  Right Biceps fem SH Nml Nml Nml *None *- *- *- *None N/A  Right Gluteus med Nml Nml Nml Nml Nml Nml Nml Nml N/A  Right FDI Nml Nml Nml Nml Nml Nml Nml Nml N/A  Right Pronator teres Nml Nml Nml Nml Nml Nml Nml Nml N/A  Right Biceps Nml Nml Nml Nml Nml Nml Nml Nml N/A  Right Triceps lat hd Nml Nml Nml Nml Nml Nml Nml Nml N/A  Right Deltoid Nml Nml Nml Nml Nml Nml Nml Nml N/A      Waveforms:  Motor           Sensory             H-Reflex

## 2023-07-09 ENCOUNTER — Encounter: Payer: Self-pay | Admitting: Family Medicine

## 2023-07-09 ENCOUNTER — Ambulatory Visit (INDEPENDENT_AMBULATORY_CARE_PROVIDER_SITE_OTHER): Admitting: Family Medicine

## 2023-07-09 VITALS — BP 133/87 | HR 74 | Temp 97.9°F | Ht 60.0 in | Wt 117.4 lb

## 2023-07-09 DIAGNOSIS — F32A Depression, unspecified: Secondary | ICD-10-CM

## 2023-07-09 DIAGNOSIS — G901 Familial dysautonomia [Riley-Day]: Secondary | ICD-10-CM | POA: Diagnosis not present

## 2023-07-09 DIAGNOSIS — R404 Transient alteration of awareness: Secondary | ICD-10-CM

## 2023-07-09 DIAGNOSIS — F419 Anxiety disorder, unspecified: Secondary | ICD-10-CM

## 2023-07-09 DIAGNOSIS — Z5982 Transportation insecurity: Secondary | ICD-10-CM

## 2023-07-09 DIAGNOSIS — S80811A Abrasion, right lower leg, initial encounter: Secondary | ICD-10-CM

## 2023-07-09 DIAGNOSIS — E440 Moderate protein-calorie malnutrition: Secondary | ICD-10-CM | POA: Diagnosis not present

## 2023-07-09 DIAGNOSIS — R5381 Other malaise: Secondary | ICD-10-CM | POA: Insufficient documentation

## 2023-07-09 DIAGNOSIS — F339 Major depressive disorder, recurrent, unspecified: Secondary | ICD-10-CM

## 2023-07-09 NOTE — Patient Instructions (Addendum)
 [address] [facility] [address] [place], [place] [address] [phone]  [facility] Health Care Guide  Direct Dial: [phone]   [facility] and Human Services [phone]
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
## 2023-07-11 ENCOUNTER — Encounter: Payer: Self-pay | Admitting: *Deleted

## 2023-07-11 ENCOUNTER — Other Ambulatory Visit: Payer: Self-pay | Admitting: *Deleted

## 2023-07-11 ENCOUNTER — Other Ambulatory Visit: Payer: Self-pay

## 2023-07-11 VITALS — Wt 117.4 lb

## 2023-07-11 DIAGNOSIS — F419 Anxiety disorder, unspecified: Secondary | ICD-10-CM

## 2023-07-11 DIAGNOSIS — F339 Major depressive disorder, recurrent, unspecified: Secondary | ICD-10-CM
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
## 2023-07-11 NOTE — Patient Instructions (Addendum)
 Visit Information  Thank you for taking time to visit with me today. Please don't hesitate to contact me if I can be of assistance to you before our next scheduled appointment.  Telephone follow up appointment date/time:  07-21-2023 at 9:00 am with LCSW Closing From:  RN Complex Care Management. Does not meet criteria at this time.  Please call the care guide team at [phone] if you need to cancel or reschedule your appointment.   Following is a copy of your care plan:   Goals Addressed   None     Please call the Suicide and Crisis Lifeline: 988 call the [place]  National Suicide Prevention Lifeline: 7140337277 or TTY: 3061340894 TTY (484) 565-7488) to talk to a trained counselor call 1-800-273-TALK (toll free, 24 hour hotline) call the [facility]: 3431009993 call 911 if you are experiencing a Mental Health or Behavioral Health Crisis or need someone to talk to.  Patient verbalizes understanding of instructions and care plan provided today and agrees to view in MyChart. Active MyChart status and patient understanding of how to access instructions and care plan via MyChart confirmed with patient.     Grandville Lax, BSN RN [facility], [facility] RN Care Manager Direct Dial: [phone]  Fax: [phone]

## 2023-07-14 ENCOUNTER — Ambulatory Visit: Payer: Self-pay

## 2023-07-14 DIAGNOSIS — R52 Pain, unspecified: Secondary | ICD-10-CM

## 2023-07-14 NOTE — Telephone Encounter (Signed)
 Copied from CRM [id number]. Topic: Clinical - Red Word Triage >> Jul 14, 2023  1:29 PM Suzette B wrote: [facility] that prompted transfer to Nurse Triage: Darlene from [facility] is there with the patient, she states the patient is complaining of body pain 9/10 and when she input in the system it directed her to call in .   Chief Complaint: Body Pain Symptoms: Body Aches Frequency: Acute Pertinent Negatives: Patient denies fever, numbness, weakness  Disposition: [] ED /[x] Urgent Care (no appt availability in office) / [] Appointment(In office/virtual)/ []  [place] [facility]/ [] Home Care/ [] Refused Recommended Disposition /[] [place] [facility]/ [x]  Follow-up with PCP Additional Notes:   Darlene, [facility], OT Visit, spoke with for triage. The patient is complaining of pain  Patient is requesting a pain management referral. States she spoke with Odilia Bennett, RN on 07-11-2023.   Recommended the patient seek the nearest urgent care for treatment today. Due to inability to make appointment for tomorrow.   Reason for Disposition  [1] SEVERE pain (e.g., excruciating, unable to do any normal activities) AND [2] not improved 2 hours after pain medicine  Answer Assessment - Initial Assessment Questions 1. ONSET: "When did the muscle aches or body pains start?"      Yes;terday  2. LOCATION: "What part of your body is hurting?" (e.g., entire body, arms, legs)      Entire Body 3. SEVERITY: "How bad is the pain?" (Scale 1-10; or mild, moderate, severe)   - MILD (1-3): doesn't interfere with normal activities    - MODERATE (4-7): interferes with normal activities or awakens from sleep    - SEVERE (8-10):  excruciating pain, unable to do any normal activities      Severe  4. CAUSE: "What do you think is causing the pains?"     Arthritis  5. FEVER: "Have you been having fever?"     No  6. OTHER SYMPTOMS: "Do you have any other symptoms?" (e.g., chest pain, weakness, rash, cold or flu  symptoms, weight loss)     No  7. PREGNANCY: "Is there any chance you are pregnant?" "When was your last menstrual period?"     No Periods on HRT  8. TRAVEL: "Have you traveled out of the country in the last month?" (e.g., travel history, exposures)     No  Protocols used: Muscle Aches and Body Pain-A-AH

## 2023-07-15 ENCOUNTER — Telehealth: Payer: Self-pay | Admitting: Family Medicine

## 2023-07-15 ENCOUNTER — Ambulatory Visit: Payer: Self-pay | Admitting: Internal Medicine

## 2023-07-15 ENCOUNTER — Telehealth: Payer: Self-pay | Admitting: Internal Medicine

## 2023-07-15 NOTE — Telephone Encounter (Signed)
 Bryar requested to cancel her upcoming appointment and declined rescheduling. She stated the antibiotic was stopped and she is not being treated for an infection when she should be. She does not see a need for a follow-up unless there are plans to treat the infection. Patient can be reached at [phone], if needed.

## 2023-07-15 NOTE — Telephone Encounter (Signed)
 Please advise.   Copied from CRM [id number]. Topic: Referral - Status >> Jul 15, 2023 10:05 AM Georgeann [facility] wrote: Reason for CRM: Patient requesting status update on referral to Pain Clinic for pain management. Please contact patient at [phone].

## 2023-07-15 NOTE — Telephone Encounter (Signed)
 Her Pain Management Referral was denied because she was currently on IV Antibiotics. I have tried to resend the current Referral but am unable to do so. Can you place another referral for her and I will send it to another office? :)  Thanks for your help :)

## 2023-07-16 ENCOUNTER — Ambulatory Visit

## 2023-07-16 DIAGNOSIS — G901 Familial dysautonomia [Riley-Day]: Secondary | ICD-10-CM | POA: Diagnosis not present

## 2023-07-16 DIAGNOSIS — G709 Myoneural disorder, unspecified: Secondary | ICD-10-CM

## 2023-07-16 DIAGNOSIS — F4310 Post-traumatic stress disorder, unspecified: Secondary | ICD-10-CM

## 2023-07-16 DIAGNOSIS — N920 Excessive and frequent menstruation with regular cycle: Secondary | ICD-10-CM

## 2023-07-16 DIAGNOSIS — I7774 Dissection of vertebral artery: Secondary | ICD-10-CM | POA: Diagnosis not present

## 2023-07-16 DIAGNOSIS — F319 Bipolar disorder, unspecified: Secondary | ICD-10-CM

## 2023-07-16 DIAGNOSIS — M419 Scoliosis, unspecified: Secondary | ICD-10-CM

## 2023-07-16 DIAGNOSIS — G8929 Other chronic pain: Secondary | ICD-10-CM

## 2023-07-16 DIAGNOSIS — D519 Vitamin B12 deficiency anemia, unspecified: Secondary | ICD-10-CM

## 2023-07-16 DIAGNOSIS — F419 Anxiety disorder, unspecified: Secondary | ICD-10-CM

## 2023-07-16 DIAGNOSIS — F4000 Agoraphobia, unspecified: Secondary | ICD-10-CM

## 2023-07-16 DIAGNOSIS — I10 Essential (primary) hypertension: Secondary | ICD-10-CM

## 2023-07-16 NOTE — Telephone Encounter (Signed)
Referral placed to pain management

## 2023-07-17 ENCOUNTER — Encounter: Payer: Self-pay | Admitting: *Deleted

## 2023-07-17 ENCOUNTER — Ambulatory Visit: Admitting: Internal Medicine

## 2023-07-17 NOTE — Progress Notes (Signed)
 Patient advised. Per patient she felt like the Asprin didn't help. So she would feel comfortable to continue taking the Plavix  if that is okay.  Also patient states she is still having pain on that right side, Also she wanted Dr.Jaffe to look at her EMG.

## 2023-07-18 ENCOUNTER — Telehealth: Payer: Self-pay

## 2023-07-18 NOTE — Telephone Encounter (Signed)
 Patient advised of note. Per patient the Gabapentin  was decreased to BID due to the fatigue.   Will wait to discuss with Pain management.

## 2023-07-18 NOTE — Telephone Encounter (Signed)
-----   Message from Nathaniel Bald sent at 07/18/2023  6:19 AM EDT ----- The goal of the aspirin  is to prevent potential future stroke.  With a healed vertebral artery, it is not recommended to remain on Plavix .  I would discontinue Plavix  and take aspirin  81mg  daily  I don't have a neurologic explanation for the numbness and tingling.  The nerve conduction study is normal.  There is no evidence of nerve damage.  Labs looking for causes of numbness and tingling were negative.  The ANA, which is a nonspecific marker for inflammation was only borderline positive but more specific tests were negative.  Therefore it is of unknown clinical significance.   I see that her PCP sent in a referral to pain management.  For pain, we can increase gabapentin .  Please send in prescription for increased gabapentin  400mg  three times daily.  However, given her complex history of chronic pain, I advise that further treatment should be followed by pain management. ----- Message ----- From: Michalene Agee, CMA Sent: 07/17/2023   3:11 PM EDT To: Merriam Abbey, DO  Patient advised. Per patient she felt like the Asprin didn't help. So she would feel comfortable to continue taking the Plavix  if that is okay.  Also patient states she is still having pain on that right side, Also she wanted Dr.Jaffe to look at her EMG.

## 2023-07-21 ENCOUNTER — Telehealth: Payer: Self-pay | Admitting: Family Medicine

## 2023-07-21 ENCOUNTER — Other Ambulatory Visit: Payer: Self-pay | Admitting: Licensed Clinical Social Worker

## 2023-07-21 DIAGNOSIS — K3184 Gastroparesis: Secondary | ICD-10-CM

## 2023-07-21 DIAGNOSIS — G894 Chronic pain syndrome: Secondary | ICD-10-CM

## 2023-07-21 NOTE — Telephone Encounter (Signed)
 Copied from CRM [id number]. Topic: Clinical - Medication Refill >> Jul 21, 2023 11:03 AM Alysia Jumbo S wrote: Medication: Patient requesting a refill on all medications,at a 90 day supply. Patient states she is moving out of state due to safety.  Has the patient contacted their pharmacy? No (Agent: If no, request that the patient contact the pharmacy for the refill. If patient does not wish to contact the pharmacy document the reason why and proceed with request.) (Agent: If yes, when and what did the pharmacy advise?)  This is the patient's preferred pharmacy:  [facility] [place], [place] - [address] [place] [address] Phone: [phone] Fax: [phone]  Is this the correct pharmacy for this prescription? Yes If no, delete pharmacy and type the correct one.   Has the prescription been filled recently? No  Is the patient out of the medication? Yes  Has the patient been seen for an appointment in the last year OR does the patient have an upcoming appointment? Yes  Can we respond through MyChart? Yes  Agent: Please be advised that Rx refills may take up to 3 business days. We ask that you follow-up with your pharmacy.

## 2023-07-21 NOTE — Patient Instructions (Signed)
 Visit Information  Thank you for taking time to visit with me today. Please don't hesitate to contact me if I can be of assistance to you before our next scheduled appointment.  Your next care management appointment is by telephone on 07/28/2023 at 9am  Telephone follow up appointment date/time:  07/28/2023 9am  Please call the care guide team at [phone] if you need to cancel, schedule, or reschedule an appointment.   Please call 911 if you are experiencing a Mental Health or Behavioral Health Crisis or need someone to talk to.  Fletcher Humble MSW, LCSW Licensed Clinical Social Worker  [facility], Population Health Direct Dial: [phone]  Fax: [phone]

## 2023-07-21 NOTE — Telephone Encounter (Signed)
 Patient is requesting a refill on all medications with a 90- day supply. She is unsure of exactly what medications. She states she is scheduled to move out of state at the end of the week due to safety.
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
## 2023-07-22 ENCOUNTER — Institutional Professional Consult (permissible substitution): Admitting: Professional Counselor

## 2023-07-22 ENCOUNTER — Ambulatory Visit: Payer: Self-pay

## 2023-07-22 NOTE — Telephone Encounter (Signed)
 Returned patients call - states that she is in an emergency situation and she has to leave the state.  States that she will be leaving Saturday and is trying to get everything in order before she leaves. She would like all her medication to be sent in for her.  Aware her pain medication will not be sent in. States once she is out of state she will no longer have insurance and no idea how long it will take to get it.  Would like to know if most of her medications can be sent in for 90 days? PCP out of office this week so will send to covering provider to advise and send in all refills if approved.

## 2023-07-22 NOTE — Telephone Encounter (Signed)
 Was sent to covering pcp who has not responded yet.

## 2023-07-22 NOTE — Telephone Encounter (Signed)
  Chief Complaint: pain Symptoms: severe joint pain and swelling (knees, wrists, rib cage), "low grade fever 99-100 degrees" Frequency: worsening x few days Pertinent Negatives: Patient denies N/A. Disposition: [] ED /[] Urgent Care (no appt availability in office) / [x] Appointment(In office/virtual)/ []  [facility]/ [] Home Care/ [] Refused Recommended Disposition /[] [facility]/ []  Follow-up with PCP Additional Notes: Patient calling in regarding chronic pain that has worsened over the past few days. She states she is in a "state of emergency" and is having to pack up her stuff into boxes and move. She is requesting 90 day supply of her medications to get her thru her move. Patient called yesterday and spoke to RN Ramiro Burly, see telephone encounter. Advised patient that message was routed to NP Adell Hones this morning and that medication requests can take up to 3 days. Patient state it feels like a lupus flare up due to the pain and swelling. Offered acute visit and patient states she doesn't have transportation, patient states she is not able to go to urgent care or ED either due to no transportation. Scheduled virtual visit with NP Adell Hones tomorrow morning to discuss symptoms with provider.  Copied from CRM [id number]. Topic: General - Other >> Jul 22, 2023 12:46 PM Tiffany S wrote: Reason for CRM: Reporting patient pain level 10/10  [facility] Reason for Disposition  [1] SEVERE pain (e.g., excruciating, unable to do any normal activities) AND [2] not improved 2 hours after pain medicine  Answer Assessment - Initial Assessment Questions 1. ONSET: "When did the muscle aches or body pains start?"      Worsening over the last few days; chronic pain over a few years.  2. LOCATION: "What part of your body is hurting?" (e.g., entire body, arms, legs)      Joints (knees, wrists, and around rib cage).  3. SEVERITY: "How bad is the pain?" (Scale 1-10; or mild, moderate, severe)   - MILD  (1-3): doesn't interfere with normal activities    - MODERATE (4-7): interferes with normal activities or awakens from sleep    - SEVERE (8-10):  excruciating pain, unable to do any normal activities      10/10.  4. CAUSE: "What do you think is causing the pains?"     She states it feels like a Lupus flare up as well.  5. FEVER: "Have you been having fever?"     "Low grade 99.3-100".  6. OTHER SYMPTOMS: "Do you have any other symptoms?" (e.g., chest pain, weakness, rash, cold or flu symptoms, weight loss)     Joint swelling (knees, wrists and around rib cage)  7. PREGNANCY: "Is there any chance you are pregnant?" "When was your last menstrual period?"     LMP: she states she doesn't have them.  8. TRAVEL: "Have you traveled out of the country in the last month?" (e.g., travel history, exposures)     N/A.  Protocols used: Muscle Aches and Body Pain-A-AH

## 2023-07-23 ENCOUNTER — Telehealth: Payer: Self-pay

## 2023-07-23 ENCOUNTER — Encounter: Payer: Self-pay | Admitting: Nurse Practitioner

## 2023-07-23 ENCOUNTER — Ambulatory Visit: Admitting: Nurse Practitioner

## 2023-07-23 ENCOUNTER — Telehealth: Payer: Self-pay | Admitting: Family Medicine

## 2023-07-23 ENCOUNTER — Ambulatory Visit: Admitting: Gastroenterology

## 2023-07-23 MED ORDER — CYANOCOBALAMIN 1000 MCG/ML IJ SOLN: INTRAMUSCULAR | 0 refills | Status: AC

## 2023-07-23 MED ORDER — AZITHROMYCIN 250 MG PO TABS: 250.0000 mg | ORAL_TABLET | Freq: Every day | ORAL | 1 refills | Status: AC

## 2023-07-23 MED ORDER — METOCLOPRAMIDE HCL 5 MG PO TABS
5.0000 mg | ORAL_TABLET | Freq: Three times a day (TID) | ORAL | 0 refills | Status: AC
Start: 2023-07-23 — End: ?

## 2023-07-23 MED ORDER — PREDNISONE 5 MG PO TABS: 5.0000 mg | ORAL_TABLET | Freq: Every day | ORAL | 0 refills | Status: AC

## 2023-07-23 MED ORDER — NITROGLYCERIN 0.4 MG SL SUBL: 0.4000 mg | SUBLINGUAL_TABLET | SUBLINGUAL | 0 refills | Status: AC | PRN

## 2023-07-23 MED ORDER — ALBUTEROL SULFATE HFA 108 (90 BASE) MCG/ACT IN AERS: 2.0000 | INHALATION_SPRAY | Freq: Four times a day (QID) | RESPIRATORY_TRACT | 1 refills | Status: AC | PRN

## 2023-07-23 MED ORDER — BACLOFEN 20 MG PO TABS
20.0000 mg | ORAL_TABLET | ORAL | 0 refills | Status: AC | PRN
Start: 2023-07-23 — End: ?

## 2023-07-23 MED ORDER — CLOPIDOGREL BISULFATE 75 MG PO TABS: 75.0000 mg | ORAL_TABLET | Freq: Every day | ORAL | 0 refills | Status: AC

## 2023-07-23 MED ORDER — CYCLOBENZAPRINE HCL 10 MG PO TABS: 10.0000 mg | ORAL_TABLET | Freq: Three times a day (TID) | ORAL | 0 refills | Status: AC | PRN

## 2023-07-23 MED ORDER — GABAPENTIN 400 MG PO CAPS: 400.0000 mg | ORAL_CAPSULE | Freq: Two times a day (BID) | ORAL | 0 refills | Status: AC

## 2023-07-23 MED ORDER — NORTREL 0.5/35 (28) 0.5-35 MG-MCG PO TABS
1.0000 | ORAL_TABLET | Freq: Every day | ORAL | 0 refills | Status: AC
Start: 2023-07-23 — End: ?

## 2023-07-23 MED ORDER — SULFAMETHOXAZOLE-TRIMETHOPRIM 800-160 MG PO TABS: 1.0000 | ORAL_TABLET | Freq: Two times a day (BID) | ORAL | 1 refills | Status: AC

## 2023-07-23 MED ORDER — EPINEPHRINE 0.3 MG/0.3ML IJ SOAJ: 0.3000 mg | INTRAMUSCULAR | 1 refills | Status: AC | PRN

## 2023-07-23 MED ORDER — PROMETHAZINE HCL 25 MG PO TABS: 25.0000 mg | ORAL_TABLET | Freq: Four times a day (QID) | ORAL | 3 refills | Status: AC | PRN

## 2023-07-23 MED ORDER — ONDANSETRON 8 MG PO TBDP: 8.0000 mg | ORAL_TABLET | ORAL | 0 refills | Status: AC | PRN

## 2023-07-23 MED ORDER — ERGOCALCIFEROL 1.25 MG (50000 UT) PO CAPS
50000.0000 [IU] | ORAL_CAPSULE | ORAL | 0 refills | Status: AC
Start: 2023-07-25 — End: ?

## 2023-07-23 MED ORDER — HYDRALAZINE HCL 10 MG PO TABS: 5.0000 mg | ORAL_TABLET | ORAL | 0 refills | Status: AC | PRN

## 2023-07-23 MED ORDER — BREO ELLIPTA 100-25 MCG/ACT IN AEPB: 1.0000 | INHALATION_SPRAY | Freq: Every day | RESPIRATORY_TRACT | 6 refills | Status: AC

## 2023-07-23 NOTE — Telephone Encounter (Signed)
 Please see other telephone encounter, multiple calls regarding this, will close encounter.

## 2023-07-23 NOTE — Telephone Encounter (Signed)
 See other encounter.

## 2023-07-23 NOTE — Telephone Encounter (Signed)
 Copied from CRM [id number]. Topic: Clinical - Medication Refill >> Jul 21, 2023 11:03 AM Alysia Jumbo S wrote: Medication: Patient requesting a refill on all medications,at a 90 day supply. Patient states she is moving out of state due to safety.  Has the patient contacted their pharmacy? No (Agent: If no, request that the patient contact the pharmacy for the refill. If patient does not wish to contact the pharmacy document the reason why and proceed with request.) (Agent: If yes, when and what did the pharmacy advise?)  This is the patient's preferred pharmacy:  [facility] - [place], [place] - [address] [place] [address] Phone: [phone] Fax: [phone]  Is this the correct pharmacy for this prescription? Yes If no, delete pharmacy and type the correct one.   Has the prescription been filled recently? No  Is the patient out of the medication? Yes  Has the patient been seen for an appointment in the last year OR does the patient have an upcoming appointment? Yes  Can we respond through MyChart? Yes  Agent: Please be advised that Rx refills may take up to 3 business days. We ask that you follow-up with your pharmacy. >> Jul 23, 2023  9:00 AM Donald Frost wrote: The patient called back in to check on the status of her medication refills saying it is imperative to get these as soon as possible as she is leaving by Saturday and not coming back. She says it is for her safety. Please assist as soon as possible. I spoke with Mitzi who is also going to pass the message on

## 2023-07-23 NOTE — Telephone Encounter (Signed)
 Spoke with pt all refills sent to pharmacy. Pt verbalized understanding

## 2023-07-23 NOTE — Telephone Encounter (Signed)
  Prescription Request  07/23/2023  Is this a "Controlled Substance" medicine? NO  Have you seen your PCP in the last 2 weeks? YES  If YES, route message to pool  -  If NO, patient needs to be scheduled for appointment.  What is the name of the medication or equipment? Needs all meds called in for #90 except pain meds. Patient is going out of the country this weekend. Couldn't do video with Adell Hones on 5-14. Couldn't get it pulled up and has no transportation to the office.  Have you contacted your pharmacy to request a refill? no   Which pharmacy would you like this sent to? [facility] in [place].   Patient notified that their request is being sent to the clinical staff for review and that they should receive a response within 2 business days.

## 2023-07-23 NOTE — Addendum Note (Signed)
 Addended by: Kyra Phy on: 07/23/2023 09:38 AM   Modules accepted: Orders

## 2023-07-28 ENCOUNTER — Other Ambulatory Visit: Payer: Self-pay | Admitting: Licensed Clinical Social Worker
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
## 2023-07-28 NOTE — Patient Instructions (Signed)
 Visit Information  Thank you for taking time to visit with me today. Please don't hesitate to contact me if I can be of assistance to you before our next scheduled appointment.  Your next care management appointment is no further follow appt  Patient has met all care management goals. Care Management case will be closed. Patient has been provided contact information should new needs arise.   Please call the care guide team at [phone] if you need to cancel, schedule, or reschedule an appointment.   Please call 911 if you are experiencing a Mental Health or Behavioral Health Crisis or need someone to talk to.  Fletcher Humble MSW, LCSW Licensed Clinical Social Worker  [facility], Population Health Direct Dial: [phone]  Fax: [phone]

## 2023-07-30 ENCOUNTER — Encounter: Payer: Self-pay | Admitting: Family Medicine

## 2023-07-30 ENCOUNTER — Ambulatory Visit: Admitting: Family Medicine

## 2023-08-08 ENCOUNTER — Encounter: Payer: Medicare Other | Admitting: Internal Medicine

## 2023-08-12 ENCOUNTER — Ambulatory Visit: Admitting: Internal Medicine

## 2023-09-03 ENCOUNTER — Encounter: Payer: Self-pay | Admitting: Family Medicine

## 2023-09-03 ENCOUNTER — Ambulatory Visit: Admitting: Family Medicine

## 2023-09-16 ENCOUNTER — Ambulatory Visit: Admitting: Family Medicine

## 2023-09-18 ENCOUNTER — Telehealth: Payer: Self-pay | Admitting: Family Medicine

## 2023-09-18 NOTE — Telephone Encounter (Signed)
 Called patient to check on her since Marry Kins is no longer here.  Wanted to try and set patient up with new provider.  Patient states she is no longer living in [place] and will seek medical care in [place]  now.

## 2023-12-11 ENCOUNTER — Ambulatory Visit: Admitting: Neurology
